# Patient Record
Sex: Female | Born: 1950
Health system: Southern US, Community
[De-identification: ages and names within clinical notes are randomized; demographics above are authoritative.]

## PROBLEM LIST (undated history)

## (undated) DIAGNOSIS — T4145XA Adverse effect of unspecified anesthetic, initial encounter: Secondary | ICD-10-CM

## (undated) DIAGNOSIS — H547 Unspecified visual loss: Secondary | ICD-10-CM

## (undated) DIAGNOSIS — K5900 Constipation, unspecified: Secondary | ICD-10-CM

## (undated) DIAGNOSIS — I341 Nonrheumatic mitral (valve) prolapse: Secondary | ICD-10-CM

## (undated) DIAGNOSIS — N879 Dysplasia of cervix uteri, unspecified: Secondary | ICD-10-CM

## (undated) DIAGNOSIS — R112 Nausea with vomiting, unspecified: Secondary | ICD-10-CM

## (undated) DIAGNOSIS — D649 Anemia, unspecified: Secondary | ICD-10-CM

## (undated) DIAGNOSIS — R5383 Other fatigue: Secondary | ICD-10-CM

## (undated) DIAGNOSIS — Z9889 Other specified postprocedural states: Secondary | ICD-10-CM

## (undated) DIAGNOSIS — F329 Major depressive disorder, single episode, unspecified: Secondary | ICD-10-CM

## (undated) DIAGNOSIS — K629 Disease of anus and rectum, unspecified: Secondary | ICD-10-CM

## (undated) DIAGNOSIS — D049 Carcinoma in situ of skin, unspecified: Secondary | ICD-10-CM

## (undated) DIAGNOSIS — R7303 Prediabetes: Secondary | ICD-10-CM

## (undated) DIAGNOSIS — H3410 Central retinal artery occlusion, unspecified eye: Secondary | ICD-10-CM

## (undated) DIAGNOSIS — Z8619 Personal history of other infectious and parasitic diseases: Secondary | ICD-10-CM

## (undated) DIAGNOSIS — T8859XA Other complications of anesthesia, initial encounter: Secondary | ICD-10-CM

## (undated) DIAGNOSIS — M1990 Unspecified osteoarthritis, unspecified site: Secondary | ICD-10-CM

## (undated) DIAGNOSIS — A64 Unspecified sexually transmitted disease: Secondary | ICD-10-CM

## (undated) DIAGNOSIS — Z0000 Encounter for general adult medical examination without abnormal findings: Secondary | ICD-10-CM

## (undated) DIAGNOSIS — T7840XA Allergy, unspecified, initial encounter: Secondary | ICD-10-CM

## (undated) DIAGNOSIS — M810 Age-related osteoporosis without current pathological fracture: Secondary | ICD-10-CM

## (undated) DIAGNOSIS — E119 Type 2 diabetes mellitus without complications: Secondary | ICD-10-CM

## (undated) DIAGNOSIS — C801 Malignant (primary) neoplasm, unspecified: Secondary | ICD-10-CM

## (undated) DIAGNOSIS — E785 Hyperlipidemia, unspecified: Secondary | ICD-10-CM

## (undated) DIAGNOSIS — H269 Unspecified cataract: Secondary | ICD-10-CM

## (undated) DIAGNOSIS — F32A Depression, unspecified: Secondary | ICD-10-CM

## (undated) HISTORY — DX: Dysplasia of cervix uteri, unspecified: N87.9

## (undated) HISTORY — DX: Other complications of anesthesia, initial encounter: T88.59XA

## (undated) HISTORY — DX: Type 2 diabetes mellitus without complications: E11.9

## (undated) HISTORY — PX: EYE SURGERY: SHX253

## (undated) HISTORY — DX: Major depressive disorder, single episode, unspecified: F32.9

## (undated) HISTORY — DX: Other fatigue: R53.83

## (undated) HISTORY — DX: Unspecified visual loss: H54.7

## (undated) HISTORY — DX: Encounter for general adult medical examination without abnormal findings: Z00.00

## (undated) HISTORY — DX: Central retinal artery occlusion, unspecified eye: H34.10

## (undated) HISTORY — DX: Hyperlipidemia, unspecified: E78.5

## (undated) HISTORY — PX: POLYPECTOMY: SHX149

## (undated) HISTORY — DX: Disease of anus and rectum, unspecified: K62.9

## (undated) HISTORY — PX: CATARACT EXTRACTION, BILATERAL: SHX1313

## (undated) HISTORY — DX: Other specified postprocedural states: Z98.890

## (undated) HISTORY — DX: Malignant (primary) neoplasm, unspecified: C80.1

## (undated) HISTORY — DX: Personal history of other infectious and parasitic diseases: Z86.19

## (undated) HISTORY — DX: Prediabetes: R73.03

## (undated) HISTORY — PX: OTHER SURGICAL HISTORY: SHX169

## (undated) HISTORY — DX: Nonrheumatic mitral (valve) prolapse: I34.1

## (undated) HISTORY — PX: LAMINECTOMY: SHX219

## (undated) HISTORY — DX: Carcinoma in situ of skin, unspecified: D04.9

## (undated) HISTORY — DX: Constipation, unspecified: K59.00

## (undated) HISTORY — DX: Other specified postprocedural states: R11.2

## (undated) HISTORY — PX: EXCISION MORTON'S NEUROMA: SHX5013

## (undated) HISTORY — DX: Adverse effect of unspecified anesthetic, initial encounter: T41.45XA

## (undated) HISTORY — DX: Depression, unspecified: F32.A

## (undated) HISTORY — PX: TUBAL LIGATION: SHX77

## (undated) HISTORY — DX: Unspecified sexually transmitted disease: A64

## (undated) HISTORY — DX: Unspecified cataract: H26.9

## (undated) HISTORY — DX: Age-related osteoporosis without current pathological fracture: M81.0

## (undated) HISTORY — DX: Unspecified osteoarthritis, unspecified site: M19.90

## (undated) HISTORY — PX: CERVICAL CONE BIOPSY: SUR198

## (undated) HISTORY — DX: Anemia, unspecified: D64.9

## (undated) HISTORY — DX: Allergy, unspecified, initial encounter: T78.40XA

---

## 1991-04-08 DIAGNOSIS — N879 Dysplasia of cervix uteri, unspecified: Secondary | ICD-10-CM

## 1991-04-08 HISTORY — DX: Dysplasia of cervix uteri, unspecified: N87.9

## 1998-02-01 ENCOUNTER — Other Ambulatory Visit: Admission: RE | Admit: 1998-02-01 | Discharge: 1998-02-01 | Payer: Self-pay | Admitting: *Deleted

## 1998-05-21 ENCOUNTER — Other Ambulatory Visit: Admission: RE | Admit: 1998-05-21 | Discharge: 1998-05-21 | Payer: Self-pay | Admitting: *Deleted

## 1999-08-11 ENCOUNTER — Other Ambulatory Visit: Admission: RE | Admit: 1999-08-11 | Discharge: 1999-08-11 | Payer: Self-pay | Admitting: *Deleted

## 2000-08-11 ENCOUNTER — Other Ambulatory Visit: Admission: RE | Admit: 2000-08-11 | Discharge: 2000-08-11 | Payer: Self-pay | Admitting: *Deleted

## 2001-08-01 ENCOUNTER — Other Ambulatory Visit: Admission: RE | Admit: 2001-08-01 | Discharge: 2001-08-01 | Payer: Self-pay | Admitting: *Deleted

## 2001-09-07 HISTORY — PX: COLONOSCOPY: SHX174

## 2002-06-19 ENCOUNTER — Ambulatory Visit (HOSPITAL_COMMUNITY): Admission: RE | Admit: 2002-06-19 | Discharge: 2002-06-19 | Payer: Self-pay | Admitting: Gastroenterology

## 2002-06-19 ENCOUNTER — Encounter (INDEPENDENT_AMBULATORY_CARE_PROVIDER_SITE_OTHER): Payer: Self-pay | Admitting: Specialist

## 2002-08-07 ENCOUNTER — Other Ambulatory Visit: Admission: RE | Admit: 2002-08-07 | Discharge: 2002-08-07 | Payer: Self-pay | Admitting: *Deleted

## 2003-08-13 ENCOUNTER — Other Ambulatory Visit: Admission: RE | Admit: 2003-08-13 | Discharge: 2003-08-13 | Payer: Self-pay | Admitting: *Deleted

## 2004-08-13 ENCOUNTER — Other Ambulatory Visit: Admission: RE | Admit: 2004-08-13 | Discharge: 2004-08-13 | Payer: Self-pay | Admitting: *Deleted

## 2004-09-16 ENCOUNTER — Ambulatory Visit: Payer: Self-pay | Admitting: Internal Medicine

## 2004-10-09 ENCOUNTER — Ambulatory Visit: Payer: Self-pay | Admitting: Internal Medicine

## 2004-12-11 ENCOUNTER — Ambulatory Visit: Payer: Self-pay | Admitting: Internal Medicine

## 2004-12-12 ENCOUNTER — Encounter: Admission: RE | Admit: 2004-12-12 | Discharge: 2004-12-12 | Payer: Self-pay | Admitting: Internal Medicine

## 2005-01-15 ENCOUNTER — Ambulatory Visit: Payer: Self-pay | Admitting: Internal Medicine

## 2005-04-16 ENCOUNTER — Ambulatory Visit: Payer: Self-pay | Admitting: Internal Medicine

## 2005-05-12 ENCOUNTER — Ambulatory Visit: Payer: Self-pay | Admitting: Internal Medicine

## 2005-06-23 ENCOUNTER — Ambulatory Visit: Payer: Self-pay | Admitting: Internal Medicine

## 2005-07-21 ENCOUNTER — Ambulatory Visit: Payer: Self-pay | Admitting: Internal Medicine

## 2005-09-07 DIAGNOSIS — K629 Disease of anus and rectum, unspecified: Secondary | ICD-10-CM

## 2005-09-07 HISTORY — DX: Disease of anus and rectum, unspecified: K62.9

## 2005-09-07 HISTORY — PX: COLONOSCOPY W/ POLYPECTOMY: SHX1380

## 2005-10-06 ENCOUNTER — Ambulatory Visit: Payer: Self-pay | Admitting: Internal Medicine

## 2006-02-22 ENCOUNTER — Other Ambulatory Visit: Admission: RE | Admit: 2006-02-22 | Discharge: 2006-02-22 | Payer: Self-pay | Admitting: Obstetrics & Gynecology

## 2006-03-24 ENCOUNTER — Ambulatory Visit: Payer: Self-pay | Admitting: Internal Medicine

## 2006-04-08 ENCOUNTER — Encounter: Payer: Self-pay | Admitting: Internal Medicine

## 2006-05-28 HISTORY — PX: RECTAL SURGERY: SHX760

## 2006-06-14 ENCOUNTER — Ambulatory Visit: Payer: Self-pay | Admitting: Internal Medicine

## 2006-08-26 ENCOUNTER — Ambulatory Visit: Payer: Self-pay | Admitting: Internal Medicine

## 2006-12-09 ENCOUNTER — Encounter: Payer: Self-pay | Admitting: Internal Medicine

## 2007-01-19 DIAGNOSIS — F418 Other specified anxiety disorders: Secondary | ICD-10-CM | POA: Insufficient documentation

## 2007-01-19 DIAGNOSIS — F339 Major depressive disorder, recurrent, unspecified: Secondary | ICD-10-CM | POA: Insufficient documentation

## 2007-01-28 ENCOUNTER — Encounter: Payer: Self-pay | Admitting: Internal Medicine

## 2007-01-28 ENCOUNTER — Ambulatory Visit: Payer: Self-pay | Admitting: Internal Medicine

## 2007-01-28 LAB — CONVERTED CEMR LAB
Cholesterol: 230 mg/dL (ref 0–200)
HDL: 59.8 mg/dL (ref >39.0)
Potassium: 4 meq/L (ref 3.5–5.1)
Total CK: 172 units/L (ref 7–177)
Triglycerides: 124 mg/dL (ref 0–149)

## 2007-03-01 ENCOUNTER — Other Ambulatory Visit: Admission: RE | Admit: 2007-03-01 | Discharge: 2007-03-01 | Payer: Self-pay | Admitting: Obstetrics and Gynecology

## 2007-03-29 ENCOUNTER — Ambulatory Visit: Payer: Self-pay | Admitting: Internal Medicine

## 2007-04-14 ENCOUNTER — Encounter: Payer: Self-pay | Admitting: Internal Medicine

## 2007-04-25 ENCOUNTER — Telehealth (INDEPENDENT_AMBULATORY_CARE_PROVIDER_SITE_OTHER): Payer: Self-pay | Admitting: *Deleted

## 2007-05-11 ENCOUNTER — Encounter: Payer: Self-pay | Admitting: Internal Medicine

## 2007-05-23 ENCOUNTER — Ambulatory Visit: Payer: Self-pay | Admitting: Internal Medicine

## 2007-05-24 ENCOUNTER — Encounter (INDEPENDENT_AMBULATORY_CARE_PROVIDER_SITE_OTHER): Payer: Self-pay | Admitting: *Deleted

## 2007-05-24 LAB — CONVERTED CEMR LAB: Hgb A1c MFr Bld: 6.3 % — ABNORMAL HIGH (ref 4.6–6.0)

## 2007-07-14 ENCOUNTER — Ambulatory Visit: Payer: Self-pay | Admitting: Internal Medicine

## 2007-07-14 DIAGNOSIS — E119 Type 2 diabetes mellitus without complications: Secondary | ICD-10-CM | POA: Insufficient documentation

## 2007-07-20 ENCOUNTER — Encounter (INDEPENDENT_AMBULATORY_CARE_PROVIDER_SITE_OTHER): Payer: Self-pay | Admitting: *Deleted

## 2007-07-20 LAB — CONVERTED CEMR LAB
ALT: 19 units/L (ref 0–35)
Albumin: 3.7 g/dL (ref 3.5–5.2)
BUN: 16 mg/dL (ref 6–23)
Basophils Absolute: 0 10*3/uL (ref 0.0–0.1)
Bilirubin, Direct: 0.1 mg/dL (ref 0.0–0.3)
Creatinine, Ser: 0.6 mg/dL (ref 0.4–1.2)
Eosinophils Absolute: 0.4 10*3/uL (ref 0.0–0.6)
Free T4: 0.7 ng/dL (ref 0.6–1.6)
HCT: 35 % — ABNORMAL LOW (ref 36.0–46.0)
Lymphocytes Relative: 41.9 % (ref 12.0–46.0)
MCV: 90.1 fL (ref 78.0–100.0)
Monocytes Relative: 7.8 % (ref 3.0–11.0)
Neutro Abs: 3.1 10*3/uL (ref 1.4–7.7)
Platelets: 263 10*3/uL (ref 150–400)
Total Bilirubin: 1.1 mg/dL (ref 0.3–1.2)
WBC: 6.8 10*3/uL (ref 4.5–10.5)

## 2007-08-01 ENCOUNTER — Encounter (INDEPENDENT_AMBULATORY_CARE_PROVIDER_SITE_OTHER): Payer: Self-pay | Admitting: *Deleted

## 2007-08-01 ENCOUNTER — Ambulatory Visit: Payer: Self-pay | Admitting: Internal Medicine

## 2007-08-16 ENCOUNTER — Ambulatory Visit: Payer: Self-pay | Admitting: Internal Medicine

## 2007-08-20 ENCOUNTER — Encounter (INDEPENDENT_AMBULATORY_CARE_PROVIDER_SITE_OTHER): Payer: Self-pay | Admitting: *Deleted

## 2007-08-20 LAB — CONVERTED CEMR LAB
HCT: 36.5 % (ref 36.0–46.0)
Hemoglobin: 12.3 g/dL (ref 12.0–15.0)
Iron: 96 ug/dL (ref 42–145)
Lymphocytes Relative: 51.9 % — ABNORMAL HIGH (ref 12.0–46.0)
MCHC: 33.8 g/dL (ref 30.0–36.0)
Monocytes Relative: 9 % (ref 3.0–11.0)
Neutro Abs: 2.3 10*3/uL (ref 1.4–7.7)
Platelets: 279 10*3/uL (ref 150–400)
RBC: 4 M/uL (ref 3.87–5.11)
RDW: 12.8 % (ref 11.5–14.6)
Transferrin: 305.5 mg/dL (ref >=212.0)
Vitamin B-12: 480 pg/mL (ref 211–911)

## 2007-09-12 ENCOUNTER — Telehealth (INDEPENDENT_AMBULATORY_CARE_PROVIDER_SITE_OTHER): Payer: Self-pay | Admitting: *Deleted

## 2008-02-17 ENCOUNTER — Ambulatory Visit: Payer: Self-pay | Admitting: Internal Medicine

## 2008-03-02 ENCOUNTER — Other Ambulatory Visit: Admission: RE | Admit: 2008-03-02 | Discharge: 2008-03-02 | Payer: Self-pay | Admitting: Obstetrics and Gynecology

## 2008-06-26 ENCOUNTER — Encounter: Payer: Self-pay | Admitting: Internal Medicine

## 2008-06-27 ENCOUNTER — Ambulatory Visit: Payer: Self-pay | Admitting: Internal Medicine

## 2008-06-27 DIAGNOSIS — C20 Malignant neoplasm of rectum: Secondary | ICD-10-CM | POA: Insufficient documentation

## 2008-06-27 DIAGNOSIS — Z86010 Personal history of colonic polyps: Secondary | ICD-10-CM | POA: Insufficient documentation

## 2008-06-27 DIAGNOSIS — Z85828 Personal history of other malignant neoplasm of skin: Secondary | ICD-10-CM | POA: Insufficient documentation

## 2008-06-27 DIAGNOSIS — E785 Hyperlipidemia, unspecified: Secondary | ICD-10-CM | POA: Insufficient documentation

## 2008-06-29 ENCOUNTER — Encounter (INDEPENDENT_AMBULATORY_CARE_PROVIDER_SITE_OTHER): Payer: Self-pay | Admitting: *Deleted

## 2008-06-29 LAB — CONVERTED CEMR LAB
ALT: 20 units/L (ref 0–35)
AST: 25 units/L (ref 0–37)
BUN: 15 mg/dL (ref 6–23)
Bilirubin, Direct: 0.2 mg/dL (ref 0.0–0.3)
Creatinine, Ser: 0.9 mg/dL (ref 0.4–1.2)
Creatinine,U: 83.1 mg/dL
HCT: 37.5 % (ref 36.0–46.0)
HDL: 66.1 mg/dL (ref >39.0)
Hemoglobin: 12.5 g/dL (ref 12.0–15.0)
Hgb A1c MFr Bld: 6.2 % — ABNORMAL HIGH (ref 4.6–6.0)
MCHC: 33.3 g/dL (ref 30.0–36.0)
Microalb, Ur: 0.3 mg/dL (ref 0.0–1.9)
Platelets: 220 10*3/uL (ref 150–400)
Potassium: 5.1 meq/L (ref 3.5–5.1)
RDW: 12.9 % (ref 11.5–14.6)

## 2008-10-22 ENCOUNTER — Encounter: Payer: Self-pay | Admitting: Internal Medicine

## 2008-11-28 ENCOUNTER — Encounter: Payer: Self-pay | Admitting: Internal Medicine

## 2009-01-11 ENCOUNTER — Ambulatory Visit: Payer: Self-pay | Admitting: Internal Medicine

## 2009-01-14 ENCOUNTER — Encounter (INDEPENDENT_AMBULATORY_CARE_PROVIDER_SITE_OTHER): Payer: Self-pay | Admitting: *Deleted

## 2009-01-15 ENCOUNTER — Encounter: Payer: Self-pay | Admitting: Internal Medicine

## 2009-05-06 ENCOUNTER — Telehealth (INDEPENDENT_AMBULATORY_CARE_PROVIDER_SITE_OTHER): Payer: Self-pay | Admitting: *Deleted

## 2009-05-06 ENCOUNTER — Ambulatory Visit: Payer: Self-pay | Admitting: Internal Medicine

## 2009-05-06 LAB — CONVERTED CEMR LAB
Bilirubin Urine: NEGATIVE
Ketones, urine, test strip: NEGATIVE
Specific Gravity, Urine: <1.005
Urobilinogen, UA: 1
pH: 8

## 2009-05-09 ENCOUNTER — Telehealth (INDEPENDENT_AMBULATORY_CARE_PROVIDER_SITE_OTHER): Payer: Self-pay | Admitting: *Deleted

## 2009-06-04 ENCOUNTER — Encounter: Payer: Self-pay | Admitting: Internal Medicine

## 2009-07-12 ENCOUNTER — Ambulatory Visit: Payer: Self-pay | Admitting: Internal Medicine

## 2009-07-15 ENCOUNTER — Encounter (INDEPENDENT_AMBULATORY_CARE_PROVIDER_SITE_OTHER): Payer: Self-pay | Admitting: *Deleted

## 2009-12-26 ENCOUNTER — Ambulatory Visit: Payer: Self-pay | Admitting: Internal Medicine

## 2010-01-08 ENCOUNTER — Ambulatory Visit: Payer: Self-pay | Admitting: Internal Medicine

## 2010-01-17 LAB — CONVERTED CEMR LAB
ALT: 16 units/L (ref 0–35)
Albumin: 4.1 g/dL (ref 3.5–5.2)
Alkaline Phosphatase: 40 units/L (ref 39–117)
Cholesterol: 213 mg/dL — ABNORMAL HIGH (ref 0–200)
Direct LDL: 119.1 mg/dL
Microalb Creat Ratio: 7.3 mg/g (ref 0.0–30.0)
Microalb, Ur: 0.9 mg/dL (ref 0.0–1.9)
TSH: 2.49 microintl units/mL (ref 0.35–5.50)
Total Bilirubin: 1.2 mg/dL (ref 0.3–1.2)
Total Protein: 6.2 g/dL (ref 6.0–8.3)
Triglycerides: 58 mg/dL (ref 0.0–149.0)

## 2010-01-31 ENCOUNTER — Ambulatory Visit: Payer: Self-pay | Admitting: Internal Medicine

## 2010-02-04 LAB — CONVERTED CEMR LAB
Basophils Absolute: 0 10*3/uL (ref 0.0–0.1)
Eosinophils Relative: 2 % (ref 0–5)
HCT: 38.6 % (ref 36.0–46.0)
MCV: 90.4 fL (ref 78.0–100.0)
Monocytes Relative: 6 % (ref 3–12)
Neutro Abs: 3.2 10*3/uL (ref 1.7–7.7)
Potassium: 4.1 meq/L (ref 3.5–5.3)
RBC: 4.27 M/uL (ref 3.87–5.11)
WBC: 7.4 10*3/uL (ref 4.0–10.5)

## 2010-02-05 ENCOUNTER — Encounter: Payer: Self-pay | Admitting: Internal Medicine

## 2010-02-13 ENCOUNTER — Encounter: Payer: Self-pay | Admitting: Internal Medicine

## 2010-02-14 ENCOUNTER — Telehealth (INDEPENDENT_AMBULATORY_CARE_PROVIDER_SITE_OTHER): Payer: Self-pay | Admitting: *Deleted

## 2010-03-19 ENCOUNTER — Ambulatory Visit: Payer: Self-pay | Admitting: Internal Medicine

## 2010-03-20 ENCOUNTER — Telehealth (INDEPENDENT_AMBULATORY_CARE_PROVIDER_SITE_OTHER): Payer: Self-pay | Admitting: *Deleted

## 2010-04-22 ENCOUNTER — Ambulatory Visit: Payer: Self-pay | Admitting: Internal Medicine

## 2010-04-24 ENCOUNTER — Encounter: Payer: Self-pay | Admitting: Internal Medicine

## 2010-08-25 ENCOUNTER — Ambulatory Visit: Payer: Self-pay | Admitting: Internal Medicine

## 2010-08-26 LAB — CONVERTED CEMR LAB
Creatinine, Ser: 1.1 mg/dL (ref 0.4–1.2)
Hgb A1c MFr Bld: 6.1 % (ref 4.6–6.5)
Microalb, Ur: 0.6 mg/dL (ref 0.0–1.9)
Potassium: 4.1 meq/L (ref 3.5–5.1)

## 2010-10-05 LAB — CONVERTED CEMR LAB
ALT: 21 units/L (ref 0–35)
AST: 24 units/L (ref 0–37)
Alkaline Phosphatase: 41 units/L (ref 39–117)
BUN: 17 mg/dL (ref 6–23)
Creatinine, Ser: 1.1 mg/dL (ref 0.4–1.2)
Hgb A1c MFr Bld: 6.4 % (ref 4.6–6.5)
TSH: 0.83 microintl units/mL (ref 0.35–5.50)

## 2010-10-09 NOTE — Letter (Signed)
Summary: [facility]  [facility]   Imported By: Lanelle Bal 02/22/2010 11:21:36  _____________________________________________________________________  External Attachment:    Type:   Image     Comment:   External Document

## 2010-10-09 NOTE — Assessment & Plan Note (Signed)
Summary: STEPPED OFF CURB AND TWISTED ANKLE/KB   Vital Signs:  Patient profile:   60 year old female Temp:     98.3 degrees F oral Pulse rate:   76 / minute Resp:     15 per minute BP sitting:   110 / 60  (left arm) Cuff size:   large  Vitals Entered By: Shonna Chock CMA (March 19, 2010 1:36 PM) CC: Ankle Injury, Lower Extremity Joint pain Comments REVIEWED MED LIST, PATIENT AGREED DOSE AND INSTRUCTION CORRECT    CC:  Ankle Injury and Lower Extremity Joint pain.  History of Present Illness: Lower Extremity  Pain      This is a 60 year old woman who presents with Lower Extremity  pain.  The patient reports swelling and decreased ROM, but denies redness.  The pain is located in the right  lateral foot.  The pain began suddenly, with twisting, and with a fall when she misstepped on concrete block bordering flower garden this am @ 8:30 am.  She was wearing athletic shoes. The pain is described as sharp with movement  and dull @ rest.  To date  no evaluation. Rx: RICE . PMH of mild Osteopenia.  Allergies: 1)  Steroids 2)  * Decongestants  Physical Exam  General:  in no acute distress; alert,appropriate and cooperative throughout examination Pulses:  R and L dorsalis pedis and posterior tibial pulses are full and equal bilaterally Extremities:  No clubbing, cyanosis, edema, or deformity noted. Pain with medial rotation & inversion of R ankle @ lateral & medial arch foot > lateral rotation & eversion. Tender over proximal lateral foot Skin:  Very faint bruising R lateral malleolar area   Impression & Recommendations:  Problem # 1:  ANKLE INJURY, RIGHT (ICD-959.7)  R/O fracture  Orders: T-Ankle Comp Right (73610TC) T-Foot Right (81191YN) Prescription Created Electronically [phone]) Durable Medical Equipment (DME)  Complete Medication List: 1)  Metformin Hcl 500 Mg Tabs (Metformin hcl) .Marland Kitchen.. 1 by mouth two times a day 2)  Actos 30 Mg Tabs (Pioglitazone hcl) .Marland Kitchen.. 1 by mouth qd 3)   Ascensia Contour Monitor Kit (Blood glucose monitoring suppl) .... Use one time a day 4)  Vagifem 25 Mcg Tabs (Estradiol) .... 2 x weekly 5)  Lamictal 200 Mg Tabs (Lamotrigine) .Marland Kitchen.. 1 by mouth once daily 6)  Vaniqa 13.9 % Crea (Eflornithine hcl) .... Apply two times a day , @ least 8 hrs apart 7)  Sonata 5 Mg Caps (Zaleplon) .Marland Kitchen.. 1 by mouth at bedtime (? dose) 8)  Tramadol Hcl 50 Mg Tabs (Tramadol hcl) .Marland Kitchen.. 1 every 6 hrs as needed pain  Patient Instructions: 1)  Continue RICE this evening Prescriptions: TRAMADOL HCL 50 MG TABS (TRAMADOL HCL) 1 every 6 hrs as needed pain  #30 x 1   Entered and Authorized by:   Marga Melnick MD   Signed by:   Marga Melnick MD on 03/19/2010   Method used:   [facility]ed to ...       [facility] [address] [facility]retail)       [address]       [place], [place]  [address]       Ph: [phone] or [phone]       Fax: [phone]   RxID:   [id number]

## 2010-10-09 NOTE — Progress Notes (Signed)
Summary: Request for lab results  Phone Note Call from Patient Call back at Work Phone [phone]   Caller: Patient Summary of Call: Message left on VM pleae call with C-Diff Results   I called and spoke with patient: Good, no C. difficle present.This is the bacteria which appears after antibiotic therapy & causes diarrhea related to release of toxin. Hopp  Patient ok'd information and aware copy mailed./Chrae [facility]  February 14, 2010 12:17 PM

## 2010-10-09 NOTE — Assessment & Plan Note (Signed)
Summary: shingles shot/kn  Nurse Visit   Allergies: 1)  Steroids 2)  * Decongestants  Immunizations Administered:  Zostavax # 1:    Vaccine Type: Zostavax    Site: right arm    Mfr: Merck    Dose: 0.12ml    Route: [place]    Given by: Brenton Grills MA    Exp. Date: 04/24/2011    Lot #: [id number]    VIS given: 06/19/05 given April 22, 2010.  Orders Added: 1)  Zoster (Shingles) Vaccine Live [90736] 2)  Admin 1st Vaccine [phone]

## 2010-10-09 NOTE — Progress Notes (Signed)
Summary: Request for Radiology Results  Phone Note Call from Patient Call back at Work Phone [phone]   Caller: Patient Summary of Call: Message left on VM: Patient would like radiology results   Shonna Chock CMA  March 20, 2010 4:21 PM   Follow-up for Phone Call        Spoke with patient about foot/ankle xray's:  Per Dr.Hopper: Normal changes of "wisdom and maturity" Per Dr.Hopper wear the shoe until pain free for at least 72 hours  Patient ok'd all information and informed copy of reports mailed    Follow-up by: Shonna Chock CMA,  March 20, 2010 4:28 PM

## 2010-10-09 NOTE — Assessment & Plan Note (Signed)
Summary: dirrehea/cbs   Vital Signs:  Patient profile:   60 year old female Weight:      155.8 pounds Temp:     98.5 degrees F oral Pulse rate:   80 / minute Resp:     16 per minute BP sitting:   106 / 70  (left arm) Cuff size:   large  Vitals Entered By: Shonna Chock (Jan 31, 2010 3:07 PM) CC: Diarrhea x 3 weeks  Comments REVIEWED MED LIST, PATIENT AGREED DOSE AND INSTRUCTION CORRECT    CC:  Diarrhea x 3 weeks .  History of Present Illness:  Diarrhea      This is a 60 year old woman who presents with Diarrhea X 3 weeks.  The patient reports 4 stools or less per day, watery/unformed stools, fecal urgency, and fasting diarrhea, but denies voluminous stools, blood in stool, mucus in stool, greasy stools, malodorous stools, fecal soiling, alternating diarrhea/constipation, nocturnal diarrhea, bloating, gassiness.Onset was semi  gradual , increasing over several days. Associated symptoms include nausea and weight loss of 3#.  The patient denies fever, abdominal pain, abdominal cramps, vomiting, lightheadedness, increased thirst, joint pains, mouth ulcers, and eye redness.  The symptoms are better with hypomotility agents, Immodium.  Patient's risk factors for diarrhea include recent antibiotic use. Amoxicillin for  sinusitis in 12/2009 caused vaginitis. . She did have  international travel but only after symptoms appeared. Symptoms were better in [place].  Patient has a  history of lactose intolerance. DM well controlled ; FBS 90-110. No hypoglycemia.  Allergies: 1)  Steroids 2)  * Decongestants  Review of Systems General:  Complains of chills, fatigue, and sleep disorder; denies sweats; Being treated @ [facility] by Dr Quintella Reichert. ENT:  Denies difficulty swallowing and hoarseness. CV:  Denies palpitations. GI:  Denies yellowish skin color. GU:  No dark urine. Derm:  Denies lesion(s) and rash. Neuro:  Denies numbness and tingling. Psych:  Complains of anxiety, depression,  easily angered, easily tearful, and irritability; "Roller coaster " emotions; now "@ bottom of barrell". Husband's mental state a stress.  Physical Exam  General:  well-nourished,in no acute distress; alert,appropriate and cooperative throughout examination Eyes:  No corneal or conjunctival inflammation noted. Perrla. No icterus Mouth:  Oral mucosa and oropharynx without lesions or exudates.  Tongue moist Neck:  No deformities, masses, or tenderness noted. Lungs:  Normal respiratory effort, chest expands symmetrically. Lungs are clear to auscultation, no crackles or wheezes. Heart:  Normal rate and regular rhythm. S1 and S2 normal without gallop, murmur, click, rub or other extra sounds. Abdomen:  Bowel sounds positive,abdomen soft and non-tender without masses, organomegaly or hernias noted. Neurologic:  alert & oriented X3 and DTRs symmetrical and normal.   Skin:  Intact without suspicious lesions or rashes. No significant tenting. No jaundice Cervical Nodes:  No lymphadenopathy noted Axillary Nodes:  No palpable lymphadenopathy Psych:  memory intact for recent and remote, flat affect, and subdued.     Impression & Recommendations:  Problem # 1:  DIARRHEA (ICD-787.91)  R/O C difficle from antibiotics  Orders: Venipuncture (52841) TLB-CBC Platelet - w/Differential (85025-CBCD) TLB-Creatinine, Blood (82565-CREA) TLB-Potassium (K+) (84132-K) TLB-BUN (Urea Nitrogen) (84520-BUN) T-Culture, C-Diff Toxin A/B (32440-10272)  Problem # 2:  DIABETES-TYPE 2 (ICD-250.00) controlled Her updated medication list for this problem includes:    Metformin Hcl 500 Mg Tabs (Metformin hcl) .Marland Kitchen... 1 by mouth bid    Actos 30 Mg Tabs (Pioglitazone hcl) .Marland Kitchen... 1 by mouth qd  Complete Medication List: 1)  Metformin Hcl 500 Mg Tabs (Metformin hcl) .Marland Kitchen.. 1 by mouth bid 2)  Actos 30 Mg Tabs (Pioglitazone hcl) .Marland Kitchen.. 1 by mouth qd 3)  Ascensia Contour Monitor Kit (Blood glucose monitoring suppl) .... Use one  time a day 4)  Vagifem 25 Mcg Tabs (Estradiol) .Marland Kitchen.. 1 by mouth 2 x weekly 5)  Lamictal 100 Mg  .Marland KitchenMarland Kitchen. 1 by mouth once daily 6)  Vaniqa 13.9 % Crea (Eflornithine hcl) .... Apply two times a day , @ least 8 hrs apart 7)  Sonata 5 Mg Caps (Zaleplon) .Marland Kitchen.. 1 by mouth at bedtime (? dose) 8)  Metronidazole 250 Mg Tabs (Metronidazole) .Marland Kitchen.. 1 three times a day  Patient Instructions: 1)  Align once daily until bowels are normal. No alcohol with Metronidazole. Prescriptions: METRONIDAZOLE 250 MG TABS (METRONIDAZOLE) 1 three times a day  #21 x 0   Entered and Authorized by:   Marga Melnick MD   Signed by:   Marga Melnick MD on 01/31/2010   Method used:   [facility] ...       [facility] [address] (retail)       [address]       [place], [place]  [address]       Ph: [phone] or [phone]       Fax: [phone]   RxID:   [id number]

## 2010-10-09 NOTE — Assessment & Plan Note (Signed)
Summary: MED REFILL//PH   Vital Signs:  Patient profile:   60 year old female Weight:      139 pounds BMI:     23.58 Pulse rate:   60 / minute Resp:     15 per minute BP sitting:   110 / 62  (left arm) Cuff size:   regular  Vitals Entered By: Shonna Chock CMA (August 25, 2010 1:10 PM) CC: 6 month follow-up, Type 2 diabetes mellitus follow-up   CC:  6 month follow-up and Type 2 diabetes mellitus follow-up.  History of Present Illness: Type 2 Diabetes Mellitus Follow-Up      This is a 60 year old woman who presents for Type 2 diabetes mellitus follow-up.  The patient reports  purposeful (TLC) weight loss of 60# since 01/10, but denies polyuria, polydipsia, blurred vision, self managed hypoglycemia, and numbness of extremities.  The patient denies the following symptoms: chest pain, vomiting, orthostatic symptoms, poor wound healing, vision loss, and foot ulcer.  Since the last visit the patient reports good dietary compliance and exercising regularly.  The patient has been measuring capillary blood glucose before breakfast, 90-110.  Since the last visit, the patient reports having had eye care by an Ophthalmologist  last month. No retinopathy present. Actos wil  cost  $450 if she changes her plan.  Current Medications (verified): 1)  Metformin Hcl 500 Mg Tabs (Metformin Hcl) .Marland Kitchen.. 1 By Mouth Two Times A Day 2)  Actos 30 Mg Tabs (Pioglitazone Hcl) .Marland Kitchen.. 1 By Mouth Qd 3)  Ascensia Contour Monitor   Kit (Blood Glucose Monitoring Suppl) .... Use One Time A Day 4)  Vagifem 25 Mcg Tabs (Estradiol) .... 2 X Weekly 5)  Lamictal 200 Mg Tabs (Lamotrigine) .Marland Kitchen.. 1 By Mouth Once Daily 6)  Vaniqa 13.9 % Crea (Eflornithine Hcl) .... Apply Two Times A Day , @ Least 8 Hrs Apart 7)  Claritin 10 Mg Tabs (Loratadine) .Marland Kitchen.. 1 By Mouth Once Daily  Allergies: 1)  Steroids 2)  * Decongestants  Physical Exam  General:  Thin but well-nourished;alert,appropriate and cooperative throughout examination Lungs:   Normal respiratory effort, chest expands symmetrically. Lungs are clear to auscultation, no crackles or wheezes. Heart:  normal rate, regular rhythm, no gallop, no rub, no JVD, and grade  1/2-1 /6 systolic murmur.   Pulses:  R and L carotid,radial,dorsalis pedis and posterior tibial pulses are full and equal bilaterally Extremities:  No clubbing, cyanosis, edema.Good nail health. Neurologic:  alert & oriented X3 and sensation intact to light touch over feet.   Skin:  Intact without suspicious lesions or rashes   Impression & Recommendations:  Problem # 1:  DIABETES-TYPE 2 (ICD-250.00)  Her updated medication list for this problem includes:    Metformin Hcl 500 Mg Tabs (Metformin hcl) .Marland Kitchen... 1 by mouth two times a day    Actos 30 Mg Tabs (Pioglitazone hcl) .Marland Kitchen... 1 by mouth qd  Orders: Venipuncture (56213) TLB-Creatinine, Blood (82565-CREA) TLB-Potassium (K+) (84132-K) TLB-BUN (Urea Nitrogen) (84520-BUN) TLB-A1C / Hgb A1C (Glycohemoglobin) (83036-A1C) TLB-Microalbumin/Creat Ratio, Urine (82043-MALB)  Complete Medication List: 1)  Metformin Hcl 500 Mg Tabs (Metformin hcl) .Marland Kitchen.. 1 by mouth two times a day 2)  Actos 30 Mg Tabs (Pioglitazone hcl) .Marland Kitchen.. 1 by mouth qd 3)  Ascensia Contour Monitor Kit (Blood glucose monitoring suppl) .... Use one time a day 4)  Vagifem 25 Mcg Tabs (Estradiol) .... 2 x weekly 5)  Lamictal 200 Mg Tabs (Lamotrigine) .Marland Kitchen.. 1 by mouth once daily 6)  Vaniqa 13.9 % Crea (Eflornithine hcl) .... Apply two times a day , @ least 8 hrs apart 7)  Claritin 10 Mg Tabs (Loratadine) .Marland Kitchen.. 1 by mouth once daily  Patient Instructions: 1)  Med change wil be determined by labs.   Orders Added: 1)  Est. Patient Level III [53664] 2)  Venipuncture [40347] 3)  TLB-Creatinine, Blood [82565-CREA] 4)  TLB-Potassium (K+) [84132-K] 5)  TLB-BUN (Urea Nitrogen) [84520-BUN] 6)  TLB-A1C / Hgb A1C (Glycohemoglobin) [83036-A1C] 7)  TLB-Microalbumin/Creat Ratio, Urine  [82043-MALB]  Appended Document: MED REFILL//PH

## 2010-10-09 NOTE — Miscellaneous (Signed)
Summary: Flu/[facility]   [facility]   Imported By: Lanelle Bal 09/12/2009 08:34:14  _____________________________________________________________________  External Attachment:    Type:   Image     Comment:   External Document

## 2010-10-09 NOTE — Assessment & Plan Note (Signed)
Summary: med refill//lch   Vital Signs:  Patient profile:   60 year old female Height:      64.5 inches Weight:      167.8 pounds BMI:     28.46 Temp:     98.0 degrees F oral Pulse rate:   72 / minute Resp:     16 per minute BP sitting:   120 / 64  (left arm) Cuff size:   large  Vitals Entered By: Shonna Chock (December 26, 2009 3:45 PM) CC: Refill meds-patient states "Not a Physical" Comments REVIEWED MED LIST, PATIENT AGREED DOSE AND INSTRUCTION CORRECT    CC:  Refill meds-patient states "Not a Physical".  History of Present Illness: Destiny Romero is here for med refills; she has an active RTI, now mainly  as symptoms in her chest. FBS average 100; no 2 hr post meal glucoses recorded. No hypoglycemia. Weight down 35 # with portion control & increased CVE as gym 30 min 3X/ week on average.  Preventive Screening-Counseling & Management  Caffeine-Diet-Exercise     Does Patient Exercise: yes  Allergies: 1)  Steroids 2)  * Decongestants  Past History:  Past Medical History: Depression, Dr Wellington Hampshire, [facility], [place] Diabetes mellitus, type II Colonic polyps, PMH  of, Dr Kinnie Scales Hyperlipidemia: LDL 122 (1296/116), HDL 76, TG 72. LDL goal = < 125, ideally < 90. Skin cancer, PMH  of, Basal cell X1, Dr Venancio Poisson  Past Surgical History: G 0 P0 , Patty Grubb,NP ; Neuroma R foot;Rectal CA (low grade) , Dr  Elvina Mattes; Lumbar laminectomy 1982  Family History: Father: anger syndrome, prostate cancer ,osteoporosis, COAD,lung cancer Mother: parathyroid tumor,DM,Bulemia;osteoporosis, partial gastrectomy for DUD; Sibs:sister cold nodule/thyroid cancer,DM; bro NHL  Social History: Retired Married Never Smoked Alcohol use-yes: socially Regular exercise-yes Does Patient Exercise:  yes  Review of Systems General:  Denies fatigue and sleep disorder. Eyes:  Denies blurring, double vision, and vision loss-both eyes; Last exam 1 yr ago, no retinopathy. ENT:  Complains of  nasal congestion and ringing in ears; denies sinus pressure; No frontal headache or facial pain but some yellow secretions. CV:  Denies chest pain or discomfort, leg cramps with exertion, lightheadness, near fainting, and shortness of breath with exertion. Resp:  Complains of cough and sputum productive; denies shortness of breath and wheezing. GI:  Denies abdominal pain, bloody stools, dark tarry stools, and indigestion. Derm:  Denies poor wound healing. Neuro:  Denies numbness and tingling. Endo:  Denies cold intolerance, excessive hunger, excessive thirst, excessive urination, and heat intolerance.  Physical Exam  General:  well-nourished; alert,appropriate and cooperative throughout examination Ears:  External ear exam shows no significant lesions or deformities.  Otoscopic examination reveals clear canals, tympanic membranes are intact bilaterally without bulging, retraction, inflammation or discharge. Hearing is grossly normal bilaterally. Nose:  External nasal examination shows no deformity or inflammation. Nasal mucosa are pink and moist without lesions or exudates. Mouth:  Oral mucosa and oropharynx without lesions or exudates.  Teeth in good repair. Neck:  No deformities, masses, or tenderness noted. Lungs:  Normal respiratory effort, chest expands symmetrically. Lungs are clear to auscultation, no crackles or wheezes. Heart:  normal rate, regular rhythm, no gallop, no rub, no JVD, no HJR, and grade 1/2-1 /6 systolic murmur.   Abdomen:  Bowel sounds positive,abdomen soft and non-tender without masses, organomegaly or hernias noted. Pulses:  R and L carotid,radial,dorsalis pedis and posterior tibial pulses are full and equal bilaterally Extremities:  No clubbing, cyanosis, edema,  or deformity noted . Good nail health. Neurologic:  alert & oriented X3, sensation intact to light touch over feet, and DTRs symmetrical and normal.   Skin:  Intact without suspicious lesions or  rashes Cervical Nodes:  No lymphadenopathy noted Axillary Nodes:  No palpable lymphadenopathy Psych:  memory intact for recent and remote, normally interactive, good eye contact, not anxious appearing, and not depressed appearing.     Impression & Recommendations:  Problem # 1:  SINUSITIS- ACUTE-NOS (ICD-461.9)  The following medications were removed from the medication list:    Ciprofloxacin Hcl 500 Mg Tabs (Ciprofloxacin hcl) .Marland Kitchen... 1 two times a day Her updated medication list for this problem includes:    Amoxicillin 500 Mg Caps (Amoxicillin) .Marland Kitchen... 1 three times a day  Problem # 2:  BRONCHITIS-ACUTE (ICD-466.0)  The following medications were removed from the medication list:    Ciprofloxacin Hcl 500 Mg Tabs (Ciprofloxacin hcl) .Marland Kitchen... 1 two times a day Her updated medication list for this problem includes:    Amoxicillin 500 Mg Caps (Amoxicillin) .Marland Kitchen... 1 three times a day  Problem # 3:  DIABETES-TYPE 2 (ICD-250.00)  Her updated medication list for this problem includes:    Metformin Hcl 500 Mg Tabs (Metformin hcl) .Marland Kitchen... 1 by mouth bid    Actos 30 Mg Tabs (Pioglitazone hcl) .Marland Kitchen... 1 by mouth qd  Problem # 4:  HYPERLIPIDEMIA (ICD-272.4)  Problem # 5:  NEOPLASM, MALIGNANT, RECTUM (ICD-154.1) Resolved; monitor as per Dr Byrd Hesselbach  Complete Medication List: 1)  Metformin Hcl 500 Mg Tabs (Metformin hcl) .Marland Kitchen.. 1 by mouth bid 2)  Actos 30 Mg Tabs (Pioglitazone hcl) .Marland Kitchen.. 1 by mouth qd 3)  Zoloft 100 Mg Tabs (Sertraline hcl) .Marland Kitchen.. 1 by mouth once daily 4)  Ascensia Contour Monitor Kit (Blood glucose monitoring suppl) .... Use one time a day 5)  Vagifem 25 Mcg Tabs (Estradiol) .Marland Kitchen.. 1 by mouth 2 x weekly 6)  Lamictal 100 Mg  .Marland KitchenMarland Kitchen. 1 by mouth once daily 7)  Amoxicillin 500 Mg Caps (Amoxicillin) .Marland Kitchen.. 1 three times a day  Patient Instructions: 1)  Check your blood sugars regularly. If your readings are usually above :150 or below 90 you should contact our office. 2)  See your eye doctor  yearly to check for diabetic eye damage. 3)  Check your feet each night for sore areas, calluses or signs of infection. Please schedule fastig labs: 4)  BUN,creat, K+;Hepatic Panel ;Lipid Panel ;TSH ;HbgA1C ;Urine Microalbumin. Codes : 250.00,272.4,995.20 Prescriptions: AMOXICILLIN 500 MG CAPS (AMOXICILLIN) 1 three times a day  #30 x 0   Entered and Authorized by:   Marga Melnick MD   Signed by:   Marga Melnick MD on 12/26/2009   Method used:   [facility] ...       [phone][facility] (retail[facility]       [address]       [place], [place]  [address]       Ph: [phone] or [phone]       Fax: [phone]   RxID:   [id number] ACTOS 30 MG TABS (PIOGLITAZONE HCL) 1 by mouth qd  #90 Tablet x 3   Entered and Authorized by:   Marga Melnick MD   Signed by:   Marga Melnick MD on 12/26/2009   Method used:   Print then Give to Patient   RxID:   [id number] METFORMIN HCL 500 MG TABS (METFORMIN HCL) 1 by mouth bid  #180 x 3   Entered and  Authorized by:   Marga Melnick MD   Signed by:   Marga Melnick MD on 12/26/2009   Method used:   Print then Give to Patient   RxID:   [id number]

## 2010-11-12 ENCOUNTER — Ambulatory Visit (INDEPENDENT_AMBULATORY_CARE_PROVIDER_SITE_OTHER): Payer: PRIVATE HEALTH INSURANCE | Admitting: Internal Medicine

## 2010-11-12 ENCOUNTER — Encounter: Payer: Self-pay | Admitting: Internal Medicine

## 2010-11-12 DIAGNOSIS — J029 Acute pharyngitis, unspecified: Secondary | ICD-10-CM

## 2010-11-12 DIAGNOSIS — J019 Acute sinusitis, unspecified: Secondary | ICD-10-CM

## 2010-11-12 DIAGNOSIS — E119 Type 2 diabetes mellitus without complications: Secondary | ICD-10-CM

## 2010-11-18 NOTE — Assessment & Plan Note (Signed)
Summary: sore throat/cbs   Vital Signs:  Patient profile:   60 year old female Weight:      134.2 pounds Temp:     97.7 degrees F oral Pulse rate:   72 / minute Resp:     15 per minute BP sitting:   106 / 68  (left arm) Cuff size:   large  Vitals Entered By: Shonna Chock CMA (November 12, 2010 12:00 PM) CC: Sore throat, dry cough, sinuses, and bloody drainage from nose, URI symptoms   CC:  Sore throat, dry cough, sinuses, and bloody drainage from nose, and URI symptoms.  History of Present Illness:    Onset 11/10/2010 as ST ; she now  reports nasal congestion and purulent nasal discharge with some blood , but denies productive cough and earache.  The patient denies fever, dyspnea, and wheezing.  The patient also reports frontal  headache.  The patient denies the following risk factors for Strep sinusitis: unilateral facial pain, tooth pain, and tender adenopathy.  Rx: tea  Current Medications (verified): 1)  Metformin Hcl 500 Mg Tabs (Metformin Hcl) .Marland Kitchen.. 1 By Mouth Two Times A Day 2)  Ascensia Contour Monitor   Kit (Blood Glucose Monitoring Suppl) .... Use One Time A Day 3)  Vagifem 25 Mcg Tabs (Estradiol) .... 2 X Weekly 4)  Lamictal 200 Mg Tabs (Lamotrigine) .Marland Kitchen.. 1 By Mouth Once Daily 5)  Vaniqa 13.9 % Crea (Eflornithine Hcl) .... Apply Two Times A Day , @ Least 8 Hrs Apart 6)  Claritin 10 Mg Tabs (Loratadine) .Marland Kitchen.. 1 By Mouth Once Daily  Allergies: 1)  Steroids 2)  * Decongestants  Physical Exam  General:  Appears tired but  in no acute distress; alert,appropriate and cooperative throughout examination Ears:  External ear exam shows no significant lesions or deformities.  Otoscopic examination reveals clear canals, tympanic membranes are intact bilaterally without bulging, retraction, inflammation or discharge. Hearing is grossly normal bilaterally. Nose:  External nasal examination shows no deformity or inflammation. Nasal mucosa are  dry without lesions or exudates. Mouth:   Oral mucosa and oropharynx without lesions or exudates.  Teeth in good repair. Lungs:  Normal respiratory effort, chest expands symmetrically. Lungs are clear to auscultation, no crackles or wheezes. Cervical Nodes:  No lymphadenopathy noted Axillary Nodes:  No palpable lymphadenopathy   Impression & Recommendations:  Problem # 1:  SINUSITIS- ACUTE-NOS (ICD-461.9)  Her updated medication list for this problem includes:    Clarithromycin 500 Mg Xr24h-tab (Clarithromycin) .Marland Kitchen... 2 once daily with a meal  Problem # 2:  DIABETES-TYPE 2 (ICD-250.00) FBS < 100 Her updated medication list for this problem includes:    Metformin Hcl 500 Mg Tabs (Metformin hcl) .Marland Kitchen... 1 by mouth two times a day  Complete Medication List: 1)  Metformin Hcl 500 Mg Tabs (Metformin hcl) .Marland Kitchen.. 1 by mouth two times a day 2)  Ascensia Contour Monitor Kit (Blood glucose monitoring suppl) .... Use one time a day 3)  Vagifem 25 Mcg Tabs (Estradiol) .... 2 x weekly 4)  Lamictal 200 Mg Tabs (Lamotrigine) .Marland Kitchen.. 1 by mouth once daily 5)  Vaniqa 13.9 % Crea (Eflornithine hcl) .... Apply two times a day , @ least 8 hrs apart 6)  Claritin 10 Mg Tabs (Loratadine) .Marland Kitchen.. 1 by mouth once daily 7)  Clarithromycin 500 Mg Xr24h-tab (Clarithromycin) .... 2 once daily with a meal  Other Orders: Rapid Strep (10272)  Patient Instructions: 1)  Use Neti pot once daily as needed for congestion.Librarian, academic  once daily as needed for bowel changes. 2)  Drink as much  NON dairy fluid as you can tolerate for the next few days. Prescriptions: CLARITHROMYCIN 500 MG XR24H-TAB (CLARITHROMYCIN) 2 once daily with a meal  #20 x 0   Entered and Authorized by:   Marga Melnick MD   Signed by:   Marga Melnick MD on 11/12/2010   Method used:   Electronically to        [facility] [phone]* (retail)       [address]       [place], [place]  [address]       Ph: [phone] or [phone]       Fax: [phone]   RxID:   [id number]    Orders  Added: 1)  Rapid Strep [36644] 2)  Est. Patient Level III [03474]    Laboratory Results    Other Tests  Rapid Strep: negative

## 2011-01-29 ENCOUNTER — Other Ambulatory Visit: Payer: Self-pay | Admitting: *Deleted

## 2011-01-29 DIAGNOSIS — E119 Type 2 diabetes mellitus without complications: Secondary | ICD-10-CM

## 2011-01-30 ENCOUNTER — Other Ambulatory Visit (INDEPENDENT_AMBULATORY_CARE_PROVIDER_SITE_OTHER): Payer: PRIVATE HEALTH INSURANCE

## 2011-01-30 DIAGNOSIS — E119 Type 2 diabetes mellitus without complications: Secondary | ICD-10-CM

## 2011-02-09 ENCOUNTER — Other Ambulatory Visit: Payer: Self-pay | Admitting: Gynecology

## 2011-02-09 ENCOUNTER — Other Ambulatory Visit: Payer: Self-pay | Admitting: Internal Medicine

## 2011-02-12 ENCOUNTER — Other Ambulatory Visit: Payer: Self-pay | Admitting: Internal Medicine

## 2011-03-06 ENCOUNTER — Telehealth: Payer: Self-pay | Admitting: *Deleted

## 2011-03-06 DIAGNOSIS — Z0000 Encounter for general adult medical examination without abnormal findings: Secondary | ICD-10-CM

## 2011-03-06 NOTE — Telephone Encounter (Signed)
This is fine if [place] on plan

## 2011-03-06 NOTE — Telephone Encounter (Signed)
OK if on plan

## 2011-03-06 NOTE — Telephone Encounter (Signed)
Pt aware referral put in awaiting appt info. 

## 2011-03-06 NOTE — Telephone Encounter (Signed)
Pt states that last  Colonoscopy was done 5 year ago by Dr Kinnie Scales. Pt notes that she is due for colonoscopy now however Pt insurance will not cover for Dr Kinnie Scales to do it. Pt is requesting a referral to [facility] to have colonoscopy done. .Please advise

## 2011-03-09 ENCOUNTER — Encounter: Payer: Self-pay | Admitting: Internal Medicine

## 2011-03-17 ENCOUNTER — Ambulatory Visit (AMBULATORY_SURGERY_CENTER): Payer: PRIVATE HEALTH INSURANCE

## 2011-03-17 VITALS — Ht 64.0 in | Wt 132.5 lb

## 2011-03-17 DIAGNOSIS — Z85048 Personal history of other malignant neoplasm of rectum, rectosigmoid junction, and anus: Secondary | ICD-10-CM

## 2011-03-17 DIAGNOSIS — Z86010 Personal history of colonic polyps: Secondary | ICD-10-CM

## 2011-03-17 MED ORDER — PEG-KCL-NACL-NASULF-NA ASC-C 100 G PO SOLR: 1.0000 | Freq: Once | ORAL | Status: AC

## 2011-03-17 NOTE — Progress Notes (Signed)
Pt states she is sensitive to sedation and does get sick sometimes. She will discuss with the Dr. Christella Hartigan prior to the colonoscopy. Pt also signed  A medical release form to get records from Dr Kinnie Scales and Dr Mirian Mo regarding her last colonoscopy and surgery.Medical release form was given to [facility] . Ulis Rias RN

## 2011-03-18 ENCOUNTER — Encounter: Payer: Self-pay | Admitting: Gastroenterology

## 2011-03-23 ENCOUNTER — Telehealth: Payer: Self-pay | Admitting: Gastroenterology

## 2011-03-23 NOTE — Telephone Encounter (Signed)
Forwarded to Dr. Jacobs for review. °

## 2011-03-25 ENCOUNTER — Telehealth: Payer: Self-pay

## 2011-03-25 NOTE — Telephone Encounter (Signed)
error 

## 2011-03-25 NOTE — Telephone Encounter (Signed)
Colon to stay on schedule as planned.  Per Dr Leone Payor.  Records back on Dr Christella Hartigan desk for review pt aware.

## 2011-03-31 ENCOUNTER — Encounter: Payer: Self-pay | Admitting: Gastroenterology

## 2011-03-31 ENCOUNTER — Ambulatory Visit (AMBULATORY_SURGERY_CENTER): Payer: PRIVATE HEALTH INSURANCE | Admitting: Gastroenterology

## 2011-03-31 DIAGNOSIS — K648 Other hemorrhoids: Secondary | ICD-10-CM

## 2011-03-31 DIAGNOSIS — K644 Residual hemorrhoidal skin tags: Secondary | ICD-10-CM

## 2011-03-31 DIAGNOSIS — Z86010 Personal history of colonic polyps: Secondary | ICD-10-CM

## 2011-03-31 DIAGNOSIS — Z85048 Personal history of other malignant neoplasm of rectum, rectosigmoid junction, and anus: Secondary | ICD-10-CM

## 2011-03-31 DIAGNOSIS — Z1211 Encounter for screening for malignant neoplasm of colon: Secondary | ICD-10-CM

## 2011-03-31 LAB — GLUCOSE, CAPILLARY
Glucose-Capillary: 92 mg/dL (ref 70–99)
Glucose-Capillary: 82 mg/dL (ref 70–99)

## 2011-03-31 MED ORDER — SODIUM CHLORIDE 0.9 % IV SOLN: 500.0000 mL | INTRAVENOUS | Status: DC

## 2011-03-31 NOTE — Patient Instructions (Signed)
Please review discharge instructions  Please read information on hemorrhoids and high fiber diets

## 2011-03-31 NOTE — Progress Notes (Signed)
Blood sugar-82.  Pt denies feelings of low blood sugar.  Snack of crackers and peanut butter given at discharge.

## 2011-04-01 ENCOUNTER — Telehealth: Payer: Self-pay | Admitting: *Deleted

## 2011-04-01 ENCOUNTER — Encounter: Payer: Self-pay | Admitting: Internal Medicine

## 2011-04-01 HISTORY — PX: COLONOSCOPY: SHX174

## 2011-04-01 NOTE — Telephone Encounter (Signed)

## 2011-04-08 ENCOUNTER — Other Ambulatory Visit: Payer: PRIVATE HEALTH INSURANCE | Admitting: Gastroenterology

## 2011-04-20 ENCOUNTER — Ambulatory Visit (INDEPENDENT_AMBULATORY_CARE_PROVIDER_SITE_OTHER): Payer: PRIVATE HEALTH INSURANCE | Admitting: Internal Medicine

## 2011-04-20 ENCOUNTER — Encounter: Payer: Self-pay | Admitting: Internal Medicine

## 2011-04-20 DIAGNOSIS — E785 Hyperlipidemia, unspecified: Secondary | ICD-10-CM

## 2011-04-20 DIAGNOSIS — F329 Major depressive disorder, single episode, unspecified: Secondary | ICD-10-CM

## 2011-04-20 DIAGNOSIS — F3289 Other specified depressive episodes: Secondary | ICD-10-CM

## 2011-04-20 DIAGNOSIS — E119 Type 2 diabetes mellitus without complications: Secondary | ICD-10-CM

## 2011-04-20 MED ORDER — EFLORNITHINE HCL 13.9 % EX CREA: TOPICAL_CREAM | CUTANEOUS | Status: DC

## 2011-04-20 NOTE — Progress Notes (Signed)
Subjective:    Patient ID: Destiny Romero, female    DOB: October 20, 1950, 60 y.o.   MRN: [id number]  HPI#1  Depression, chronic ,with flare due to situational stressors. She will lose her insurance next month. Anxiety:yes, looking for job, going to school, husband has major memory issues Loss of interest (Anhedonia):no Panic attacks:no Insomnia:4 hrs/ night Anorexia:"OK" Fatigue:yes due to sleep issues Neurologic signs/symptoms: some stress headaches; no numbness and tingling, weakness Endocrinologic signs and symptoms: no hoarseness, weight change, vision change, temperature intolerance, bowel changes, skin/hair,/nail changes Medications/efficacy:Lamictal Rxed by Dr Quintella Reichert , [place]. [place] he Rxed lasted only 4 hrs  #2 Diabetes status assessment: Fasting or morning glucose range:  80-110  Highest glucose 2 hours after any meal:  Not checked. Hypoglycemia :  no .                                                     Excess thirst :no;  Excess hunger:  no ;  Excess urination:  no.                                  Lightheadedness with standing:  no. Chest pain:  No  ; Palpitations :no ;  Pain in  calves with walking:  no .                                                                                                                   Non healing skin  ulcers or sores,especially over the feet:  no. Numbness or tingling or burning in feet : no .                                                                                                                                              Significant change in  Weight : stable. Vision changes : no  .  Exercise : 3X/week as cardio & weights (60 min total) . Nutrition/diet:  no. Medication compliance : yes. Medication adverse  Effects:  no . Eye exam : 6/12 ; no retinopathy. Foot care : no.  A1c/ urine microalbumin monitor:  She believes her highest A1c was 6.6; at that time she  weighed 195 pounds. With therapeutic life change, she now weighs 134. Her last A1c was 6.2 in May of this year.           Review of Systems     Objective:   Physical Exam Gen.: Healthy and well-nourished in appearance. Alert, appropriate and cooperative throughout exam. Eyes: No corneal or conjunctival inflammation noted. Neck: No deformities, masses, or tenderness noted. Thyroid normal. Lungs: Normal respiratory effort; chest expands symmetrically. Lungs are clear to auscultation without rales, wheezes, or increased work of breathing. Heart: Normal rate and rhythm. Normal S1 and S2. No gallop, click, or rub. Grade 1/6 systolic  murmur. Abdomen: Bowel sounds normal; abdomen soft and nontender. No masses, organomegaly or hernias noted. She does have an easily auscultated aortic bruit. She is very thin & the aorta is easily palpable. There is no aneurysm present.                                                        Musculoskeletal/extremities: No deformity or scoliosis noted of  the thoracic or lumbar spine. No clubbing, cyanosis, edema, or deformity noted.Nail health  good. Vascular: Carotid, radial artery, dorsalis pedis and  posterior tibial pulses are full and equal. No bruits present. Neurologic: Alert and oriented x3. Deep tendon reflexes symmetrical and normal.Light touch over feet normal.          Skin: Intact without suspicious lesions or rashes. Lymph: No cervical, axillary  lymphadenopathy present. Psych: Mood and affect are normal; clinically not depressed. Normally interactive                                                                                         Assessment & Plan:  #1 situational anxiety/depression. Clinically she is coping extremely well on the present regimen.  #2 diabetes, excellent control  #3 aortic bruit; clinically no aneurysm.   Plan: She should be able to stop the metformin  She should continue the excellent therapeutic life style  program in which she is engaged  Insurance issues may mandate [facility].

## 2011-04-20 NOTE — Patient Instructions (Signed)
Diabetes Monitor   The A1c test is used primarily to monitor the glucose control of diabetics over time. The goal of those with diabetes is to keep their blood glucose levels as close to normal as possible. This helps to minimize the complications caused by chronically elevated glucose levels, such as progressive damage to body organs like the kidneys, eyes, cardiovascular system, and nerves. The A1c test gives a picture of the average amount of glucose in the blood over the last few months. It can help a patient and his doctor know if the measures they are taking to control the patient's diabetes are successful or need to be adjusted.  NORMAL VALUES  Non diabetic adults: 5 %-6.1%  Good diabetic control: 6.2-6.4 %  Fair diabetic control: 6.5-7%  Poor diabetic control: greater than 7 % ( except with additional factors such as  advanced age; significant coronary or neurologic disease,etc). Check the A1c every 6 months if it is < 6.5%; every 4 months if  6.5% or higher. Goals for home glucose monitoring are : fasting  or morning glucose goal of  90-150. Two hours after any meal , goal = < 180, preferably < 150.  Eat a low-fat diet with lots of fruits and vegetables, up to 7-9 servings per day. Consume less than  30  grams of sugar per day from foods & drinks with High Fructose Corn Sugar as #1,2,3 or # 4 on label. Follow the low carb nutrition program in [employer] [place] [employer] as closely as possible to prevent Diabetes progression & complications. White carbohydrates (potatoes, rice, bread, and pasta) have a high spike of sugar and a high load of sugar. For example a  baked potato has a cup of sugar and a  french fry  2 teaspoons of sugar. Yams, wild  rice, whole grained bread &  wheat pasta have been much lower spike and load of  sugar. Portions should be the size of a deck of cards or your palm.

## 2011-04-21 ENCOUNTER — Other Ambulatory Visit: Payer: Self-pay | Admitting: Internal Medicine

## 2011-04-21 DIAGNOSIS — E785 Hyperlipidemia, unspecified: Secondary | ICD-10-CM

## 2011-04-22 ENCOUNTER — Other Ambulatory Visit (INDEPENDENT_AMBULATORY_CARE_PROVIDER_SITE_OTHER): Payer: PRIVATE HEALTH INSURANCE

## 2011-04-22 DIAGNOSIS — E785 Hyperlipidemia, unspecified: Secondary | ICD-10-CM

## 2011-04-22 LAB — LIPID PANEL: VLDL: 13.6 mg/dL (ref 0.0–40.0)

## 2011-04-22 NOTE — Progress Notes (Signed)
Labs only

## 2011-04-28 ENCOUNTER — Other Ambulatory Visit: Payer: Self-pay | Admitting: Internal Medicine

## 2011-04-28 MED ORDER — PRAVASTATIN SODIUM 20 MG PO TABS: 20.0000 mg | ORAL_TABLET | Freq: Every day | ORAL | Status: DC

## 2011-05-06 ENCOUNTER — Encounter: Payer: Self-pay | Admitting: Internal Medicine

## 2011-05-07 ENCOUNTER — Encounter: Payer: Self-pay | Admitting: Internal Medicine

## 2011-05-08 ENCOUNTER — Ambulatory Visit (INDEPENDENT_AMBULATORY_CARE_PROVIDER_SITE_OTHER): Payer: PRIVATE HEALTH INSURANCE | Admitting: Internal Medicine

## 2011-05-08 ENCOUNTER — Encounter: Payer: Self-pay | Admitting: Internal Medicine

## 2011-05-08 VITALS — BP 110/70 | HR 70 | Wt 137.0 lb

## 2011-05-08 DIAGNOSIS — N390 Urinary tract infection, site not specified: Secondary | ICD-10-CM

## 2011-05-08 DIAGNOSIS — E119 Type 2 diabetes mellitus without complications: Secondary | ICD-10-CM

## 2011-05-08 LAB — POCT URINALYSIS DIPSTICK
Bilirubin, UA: NEGATIVE
Glucose, UA: NEGATIVE
Ketones, UA: NEGATIVE
Leukocytes, UA: NEGATIVE

## 2011-05-08 NOTE — Patient Instructions (Signed)
Eat a low-fat diet with lots of fruits and vegetables, up to 7-9 servings per day. Consume less than  30  grams of sugar per day from foods & drinks with High Fructose Corn Sugar as #1,2,3 or # 4 on label. Follow the low carb nutrition program in [employer] [place] [employer] [place] as closely as possible to prevent Diabetes progression & complications. White carbohydrates (potatoes, rice, bread, and pasta) have a high spike of sugar and a high load of sugar. For example a  baked potato has a cup of sugar and a  french fry  2 teaspoons of sugar. Yams, wild  rice, whole grained bread &  wheat pasta have been much lower spike and load of  sugar. Portions should be the size of a deck of cards or your palm.  Please  schedule  A1c & urine microalbumin in 4 months(250.00)

## 2011-05-08 NOTE — Progress Notes (Signed)
  Subjective:    Patient ID: Destiny Romero, female    DOB: October 19, 1950, 60 y.o.   MRN: [id number]  HPI DYSURIA: Onset: 8/16    Worsening: no, improved post Cipro Rxed by [facility]  8/16    Present Symptoms Urgency: no  Frequency: no  Hesitancy: no  Hematuria: no  Flank Pain: no  Fever: no    Nausea/Vomiting: no  Red Flags  : (Risk Factors for Complicated UTI)  More than 3 UTI's last 12 months: no  PMH of  1. DM: yes, A1c 6.2 % in 5/12 2. Renal Disease/Calculi: no 3. Urinary Tract Abnormality: no  4. Instrumentation/Trauma: no  Metformin was stopped after A1c was 6.2%. Since that time her fasting sugars in the range of 90-110.       Review of Systems     Objective:   Physical Exam  She is thin but healthy-appearing, in no distress  Chest is clear without long time, walls of radials.  She has an S4 with a grade 1 systolic murmur; she has no gallop or rub.  Abdomen is nontender no bladder distention. She has no flank pain to percussion.  Skin is clear and healthy without suspicious lesions or rashes.          Assessment & Plan:   #1 urinary tract infection, resolved with Cipro  #2 diabetes, excellent control with therapeutic lifestyle changes  Plan: Monitor fasting blood sugars; goal equals 90-150. 2 hours after any meal, called equals less than 180, ideally less than 160.

## 2011-06-24 ENCOUNTER — Telehealth: Payer: Self-pay | Admitting: *Deleted

## 2011-06-24 NOTE — Telephone Encounter (Signed)
Left msg for pt to return call.   Pt had faxed paper over to let Dr. Alwyn Ren how blood sugar is doing off medication. Per [facility] fasting goal should be 90-150. 2 hours after largest meal < 180, ideally <160 ok to stay off meds 12 weeks and check A1C, BUN, creat, Lipid, urine microablumin-250.00

## 2011-06-25 ENCOUNTER — Other Ambulatory Visit (INDEPENDENT_AMBULATORY_CARE_PROVIDER_SITE_OTHER): Payer: PRIVATE HEALTH INSURANCE

## 2011-06-25 DIAGNOSIS — D7289 Other specified disorders of white blood cells: Secondary | ICD-10-CM

## 2011-06-25 LAB — HEMOGLOBIN A1C: Hgb A1c MFr Bld: 6.4 % (ref 4.6–6.5)

## 2011-06-25 NOTE — Progress Notes (Signed)
Labs only

## 2011-06-25 NOTE — Telephone Encounter (Signed)
Lab appt scheduled.

## 2011-11-11 ENCOUNTER — Telehealth: Payer: Self-pay

## 2011-11-11 NOTE — Telephone Encounter (Signed)
Received fax from patient 11/02/11 regarding needing a OB/GYN as her current one is no longer on her insurance.  Dr. Alwyn Ren commented on her fax and original has been scanned and copy mailed to patient.

## 2011-12-07 HISTORY — PX: OOPHORECTOMY: SHX86

## 2011-12-11 ENCOUNTER — Other Ambulatory Visit: Payer: Self-pay

## 2012-03-28 ENCOUNTER — Other Ambulatory Visit: Payer: Self-pay | Admitting: Gynecology

## 2012-04-25 ENCOUNTER — Encounter: Payer: Self-pay | Admitting: Internal Medicine

## 2012-04-25 ENCOUNTER — Ambulatory Visit (INDEPENDENT_AMBULATORY_CARE_PROVIDER_SITE_OTHER): Payer: PRIVATE HEALTH INSURANCE | Admitting: Internal Medicine

## 2012-04-25 VITALS — BP 112/70 | HR 67 | Temp 98.4°F | Wt 148.0 lb

## 2012-04-25 DIAGNOSIS — J029 Acute pharyngitis, unspecified: Secondary | ICD-10-CM

## 2012-04-25 DIAGNOSIS — E119 Type 2 diabetes mellitus without complications: Secondary | ICD-10-CM

## 2012-04-25 NOTE — Progress Notes (Signed)
  Subjective:    Patient ID: Destiny Romero, female    DOB: 1951-07-11, 61 y.o.   MRN: [id number]  HPI Fasting blood sugars have been averaging 120; she does not check postprandial glucoses on a regular basis.. She denies significant polydipsia, polyphagia, or polyuria. She has no numbness or tingling, or foot lesions. Eye exam is up to date, no retinopathy. Her last A1c was 6.4% in October 2012  She's had a sore throat as of today which has been progressive. It's now associated with some discomfort in the left ear.    Review of Systems She denies chest pain, palpitations, claudication, or edema. She denies frontal headache, facial pain, nasal purulence, or dental pain. She's had no fever, chills, or sweats.  She elected not to take statins; she been prescribed pravastatin when her LDL was 131.5 in August 2012.  She has a great deal of emotional stress related to marital and financial issues. She has an appt  next week with Dr Quintella Reichert who is  prescribing generic Lamictal     Objective:   Physical Exam General appearance:good health ;well nourished; no acute distress or increased work of breathing is present.  No  lymphadenopathy about the head, neck, or axilla noted.   Eyes: No conjunctival inflammation or lid edema is present.   Ears:  External ear exam shows no significant lesions or deformities.  Otoscopic examination reveals clear canals, tympanic membranes are intact bilaterally without bulging, retraction, inflammation or discharge.  Nose:  External nasal examination shows no deformity or inflammation. Nasal mucosa are pink and moist without lesions or exudates. No septal dislocation or deviation.No obstruction to airflow.   Oral exam: Dental hygiene is good; lips and gums are healthy appearing.There is no oropharyngeal erythema or exudate noted.     Heart:  Normal rate and regular rhythm. S1 and S2 normal without gallop, murmur, click, rub or other extra sounds.   Lungs:Chest  clear to auscultation; no wheezes, rhonchi,rales ,or rubs present.No increased work of breathing.    Extremities:  No cyanosis, edema, or clubbing  noted    Skin: Warm & dry .          Assessment & Plan:  #1 diabetes; degree of control needs to be assessed  #2 pharyngitis, present less than 24 hours. She is afebrile and has no exudate or cervical lymphadenopathy.  Plan: See orders and recommendations

## 2012-04-25 NOTE — Patient Instructions (Addendum)
Zicam Melts or Zinc lozenges ; vitamin C 2000 mg daily; & Echinacea for 4-7 days. Report fever, exudate("pus") or progressive pain.  If you activate My Chart; the results can be released to you as soon as they populate from the lab. If you choose not to use this program; the labs have to be reviewed, copied & mailed   causing a delay in getting the results to you.

## 2012-04-26 LAB — MICROALBUMIN / CREATININE URINE RATIO
Creatinine,U: 22.3 mg/dL
Microalb, Ur: 0.2 mg/dL (ref 0.0–1.9)

## 2012-04-26 LAB — HEMOGLOBIN A1C: Hgb A1c MFr Bld: 6.4 % (ref 4.6–6.5)

## 2012-05-12 ENCOUNTER — Encounter: Payer: Self-pay | Admitting: Internal Medicine

## 2012-06-28 ENCOUNTER — Ambulatory Visit (INDEPENDENT_AMBULATORY_CARE_PROVIDER_SITE_OTHER): Payer: PRIVATE HEALTH INSURANCE | Admitting: Internal Medicine

## 2012-06-28 ENCOUNTER — Encounter: Payer: Self-pay | Admitting: Internal Medicine

## 2012-06-28 VITALS — BP 114/72 | HR 77 | Temp 97.8°F | Resp 12 | Ht 64.03 in | Wt 148.0 lb

## 2012-06-28 DIAGNOSIS — E785 Hyperlipidemia, unspecified: Secondary | ICD-10-CM

## 2012-06-28 DIAGNOSIS — Z86010 Personal history of colonic polyps: Secondary | ICD-10-CM

## 2012-06-28 DIAGNOSIS — Z85828 Personal history of other malignant neoplasm of skin: Secondary | ICD-10-CM

## 2012-06-28 DIAGNOSIS — C20 Malignant neoplasm of rectum: Secondary | ICD-10-CM

## 2012-06-28 DIAGNOSIS — Z23 Encounter for immunization: Secondary | ICD-10-CM

## 2012-06-28 DIAGNOSIS — Z0000 Encounter for general adult medical examination without abnormal findings: Secondary | ICD-10-CM

## 2012-06-28 NOTE — Progress Notes (Signed)
  Subjective:    Patient ID: Destiny Romero, female    DOB: 15-Jul-1951, 61 y.o.   MRN: [id number]  HPI  Destiny Romero is here for a physical;acute issues mainly stress related      Review of Systems HYPERLIPIDEMIA: Chest pain, palpitations- no      Dyspnea- no Lightheadedness,Syncope- no    Edema- no Abd pain / bowel changes: no Muscle pain:no Medications: Compliance-  Statin therapy declined   DIABETES: Disease Monitoring: Blood Sugar ranges-not monitored for several months; previously 90-120  Polyuria/phagia/dipsia- no      Visual problems- no Ophth exam: last week ; no retinopathy Medications: Compliance- diet only               Objective:   Physical Exam Gen.: Healthy and well-nourished in appearance. Alert, appropriate and cooperative throughout exam. Head: Normocephalic without obvious abnormalities  Eyes: No corneal or conjunctival inflammation noted. Pupils equal round reactive to light and accommodation.  Extraocular motion intact. Vision grossly normal with lenses. Ears: External  ear exam reveals no significant lesions or deformities. Canals clear .TMs normal. Hearing is grossly normal bilaterally. Nose: External nasal exam reveals no deformity or inflammation. Nasal mucosa are pink and moist. No lesions or exudates noted.  Mouth: Oral mucosa and oropharynx reveal no lesions or exudates. Teeth in good repair. Neck: No deformities, masses, or tenderness noted. Range of motion & Thyroid normal. Lungs: Normal respiratory effort; chest expands symmetrically. Lungs are clear to auscultation without rales, wheezes, or increased work of breathing. Heart: Normal rate and rhythm. Normal S1 and S2. No gallop,  or rub. Click @ apex w/o MR murmur. Abdomen: Bowel sounds normal; abdomen soft and nontender. No masses, organomegaly or hernias noted. Genitalia: Dr Nicholas Lose, [place] [facility]                                                             Musculoskeletal/extremities: There is some  asymmetry of the posterior thoracic musculature suggesting occult scoliosis.  No clubbing, cyanosis, edema, or deformity noted. Range of motion  normal .Tone & strength  normal.Joints normal. Nail health  good. Vascular: Carotid, radial artery, dorsalis pedis and  posterior tibial pulses are full and equal. No bruits present. Neurologic: Alert and oriented x3. Deep tendon reflexes symmetrical and normal.          Skin: Intact without suspicious lesions or rashes. Lymph: No cervical, axillary lymphadenopathy present. Psych: Mood and affect are normal. Normally interactive                                                                                         Assessment & Plan:

## 2012-06-28 NOTE — Patient Instructions (Addendum)
Preventive Health Care: Exercise  30-45  minutes a day, 3-4 days a week. Walking is especially valuable in preventing Osteoporosis. Eat a low-fat diet with lots of fruits and vegetables, up to 7-9 servings per day.  Consume less than 30 grams (preferably ZERO) of sugar per day from foods & drinks with High Fructose Corn Syrup as # 1,2,3 or #4 on label. Eye Doctor - have an eye exam @ least annually Please consider  scheduling fasting Labs : BMET,Lipids, hepatic panel, CBC & dif, TSH, A1c, urine microalbumin. PLEASE BRING THESE INSTRUCTIONS TO FOLLOW UP  LAB APPOINTMENT.This will guarantee correct labs are drawn, eliminating need for repeat blood sampling ( needle sticks ! ). Diagnoses /Codes: V70.0, 250.00

## 2012-08-19 ENCOUNTER — Encounter: Payer: Self-pay | Admitting: Internal Medicine

## 2012-08-19 ENCOUNTER — Ambulatory Visit (INDEPENDENT_AMBULATORY_CARE_PROVIDER_SITE_OTHER): Payer: PRIVATE HEALTH INSURANCE | Admitting: Internal Medicine

## 2012-08-19 VITALS — BP 110/72 | HR 72 | Temp 97.7°F | Wt 153.6 lb

## 2012-08-19 DIAGNOSIS — M1990 Unspecified osteoarthritis, unspecified site: Secondary | ICD-10-CM

## 2012-08-19 DIAGNOSIS — M899 Disorder of bone, unspecified: Secondary | ICD-10-CM

## 2012-08-19 DIAGNOSIS — M8580 Other specified disorders of bone density and structure, unspecified site: Secondary | ICD-10-CM

## 2012-08-19 NOTE — Patient Instructions (Addendum)
Nonsteroidal anti-inflammatory agents such as Indocin and should be taken as infrequently as possible & only with food in  the stomach. These are associated with an increased risk of gastric bleeding and cardiac disease if taken on a regular basis. Arthritis strength Tylenol can be taken at bedtime without risk to the stomach. Protect  hands, feet, and ears from cold exposure including ice chests. Use silk mitten liners; this can be purchased at outdoor supply stores.  Recommended lifestyle interventions for Osteoporosis include calcium 600 mg twice a day  & vitamin D3 supplementation to keep vit D  level @ least 40-60. The usual vitamin D3 dose is 1000 IU daily; but individual dose is determined by annual vitamin D level monitor. Also weight bearing exercise such as  walking 30-45 minutes 3-4  X per week is recommended.

## 2012-08-19 NOTE — Progress Notes (Signed)
  Subjective:    Patient ID: Destiny Romero, female    DOB: [birth year], 61 y.o.   MRN: [id number]  HPI Extremity pain Location:DIP joints of fingers Onset:pain this year especially since Fall Associated symptoms: joint stiffness & swelling Trigger/injury:gripping  & cold exposure Pain quality:dull aching Pain severity:up to 5, 8 with trauma  Duration:hrs Radiation:no Treatment/response:NSAIDS & parafin baths  Help  FH: Osteoporosis in parents  She is concerned about having rheumatoid arthritis        Review of Systems Constitutional: no fever, chills, sweats, change in weight  Musculoskeletal:no  muscle cramps or pain  Skin:no rash, color/ temp change Neuro: no weakness; incontinence (stool/urine); numbness and tingling Heme:no lymphadenopathy; abnormal bruising or bleeding      Objective:   Physical Exam Gen.:  well-nourished in appearance. Alert, appropriate and cooperative throughout exam. Eyes: No corneal or conjunctival inflammation noted.  Neck: No deformities, masses, or tenderness noted. Range of motion excellent.   Heart: Normal rate and rhythm. Normal S1 and S2. No gallop, click, or rub. S4 w/o murmur.                                                                            Musculoskeletal/extremities: No deformity or scoliosis noted of  the thoracic or lumbar spine. No clubbing, cyanosis, edema, or deformity noted. Range of motion  normal .Tone & strength  normal. Nail health  Good. Minor knee crepitus. Neg SLR to 90 degrees Vascular: Radial artery pulses are full and equal. No bruits present. Neurologic: Alert and oriented x3. Deep tendon reflexes symmetrical and normal. Neg Tinel's         Skin: Intact without suspicious lesions or rashes. Lymph: No cervical, axillary lymphadenopathy present. Psych: Mood and affect are normal. 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  Plan: See orders /recommendations

## 2012-08-24 LAB — VITAMIN D 1,25 DIHYDROXY
Vitamin D 1, 25 (OH)2 Total: 40 pg/mL (ref 18–72)
Vitamin D2 1, 25 (OH)2: <8 pg/mL

## 2012-11-01 ENCOUNTER — Telehealth: Payer: Self-pay | Admitting: Internal Medicine

## 2012-11-01 NOTE — Telephone Encounter (Signed)
Pt advise if insurance does not required a referral then she can just call and schedule but if insurance require a referral then she will need OV with [facility].

## 2012-11-01 NOTE — Telephone Encounter (Signed)
pt called is having an issue with her toenail. pt would like to know if hopp would like to see her or if he could recommend a podiatrist cb# [phone]

## 2013-01-27 ENCOUNTER — Encounter (HOSPITAL_COMMUNITY): Payer: Self-pay | Admitting: Emergency Medicine

## 2013-01-27 ENCOUNTER — Emergency Department (HOSPITAL_COMMUNITY)
Admission: EM | Admit: 2013-01-27 | Discharge: 2013-01-27 | Disposition: A | Discharge status: Home / Self Care | Payer: BC Managed Care – PPO | Attending: Emergency Medicine | Admitting: Emergency Medicine

## 2013-01-27 ENCOUNTER — Telehealth: Payer: Self-pay | Admitting: Orthopedic Surgery

## 2013-01-27 ENCOUNTER — Ambulatory Visit: Payer: Self-pay | Admitting: Nurse Practitioner

## 2013-01-27 DIAGNOSIS — Z9079 Acquired absence of other genital organ(s): Secondary | ICD-10-CM | POA: Insufficient documentation

## 2013-01-27 DIAGNOSIS — T7421XA Adult sexual abuse, confirmed, initial encounter: Secondary | ICD-10-CM | POA: Insufficient documentation

## 2013-01-27 DIAGNOSIS — B9689 Other specified bacterial agents as the cause of diseases classified elsewhere: Secondary | ICD-10-CM

## 2013-01-27 DIAGNOSIS — Z872 Personal history of diseases of the skin and subcutaneous tissue: Secondary | ICD-10-CM | POA: Insufficient documentation

## 2013-01-27 DIAGNOSIS — N760 Acute vaginitis: Secondary | ICD-10-CM | POA: Insufficient documentation

## 2013-01-27 DIAGNOSIS — N898 Other specified noninflammatory disorders of vagina: Secondary | ICD-10-CM | POA: Insufficient documentation

## 2013-01-27 DIAGNOSIS — Z8679 Personal history of other diseases of the circulatory system: Secondary | ICD-10-CM | POA: Insufficient documentation

## 2013-01-27 DIAGNOSIS — Z8742 Personal history of other diseases of the female genital tract: Secondary | ICD-10-CM | POA: Insufficient documentation

## 2013-01-27 DIAGNOSIS — E119 Type 2 diabetes mellitus without complications: Secondary | ICD-10-CM | POA: Insufficient documentation

## 2013-01-27 DIAGNOSIS — Z9889 Other specified postprocedural states: Secondary | ICD-10-CM | POA: Insufficient documentation

## 2013-01-27 DIAGNOSIS — Z79899 Other long term (current) drug therapy: Secondary | ICD-10-CM | POA: Insufficient documentation

## 2013-01-27 DIAGNOSIS — N390 Urinary tract infection, site not specified: Secondary | ICD-10-CM | POA: Insufficient documentation

## 2013-01-27 LAB — URINALYSIS, ROUTINE W REFLEX MICROSCOPIC
Bilirubin Urine: NEGATIVE
Ketones, ur: NEGATIVE mg/dL
Nitrite: NEGATIVE
Protein, ur: NEGATIVE mg/dL

## 2013-01-27 LAB — URINE MICROSCOPIC-ADD ON

## 2013-01-27 LAB — WET PREP, GENITAL: Trich, Wet Prep: NONE SEEN

## 2013-01-27 MED ORDER — PROMETHAZINE HCL 25 MG PO TABS
ORAL_TABLET | ORAL | Status: AC
  Filled 2013-01-27: qty 3

## 2013-01-27 MED ORDER — LEVONORGESTREL 0.75 MG PO TABS
ORAL_TABLET | ORAL | Status: AC
  Filled 2013-01-27: qty 2

## 2013-01-27 MED ORDER — METRONIDAZOLE 500 MG PO TABS
ORAL_TABLET | ORAL | Status: AC
  Administered 2013-01-27: 2000 mg
  Filled 2013-01-27: qty 4

## 2013-01-27 MED ORDER — METRONIDAZOLE 500 MG PO TABS: 500.0000 mg | ORAL_TABLET | Freq: Two times a day (BID) | ORAL | Status: DC

## 2013-01-27 MED ORDER — HEPATITIS B VAC RECOMBINANT 5 MCG/0.5ML IJ SUSP
1.0000 mL | Freq: Once | INTRAMUSCULAR | Status: AC
  Administered 2013-01-27: 5 ug via INTRAMUSCULAR
  Filled 2013-01-27: qty 1

## 2013-01-27 MED ORDER — CEPHALEXIN 500 MG PO CAPS: 500.0000 mg | ORAL_CAPSULE | Freq: Four times a day (QID) | ORAL | Status: DC

## 2013-01-27 MED ORDER — CEFIXIME 400 MG PO TABS
ORAL_TABLET | ORAL | Status: AC
  Administered 2013-01-27: 400 mg
  Filled 2013-01-27: qty 1

## 2013-01-27 MED ORDER — HEPATITIS B IMMUNE GLOBULIN IM SOLN
0.0600 mL/kg | Freq: Once | INTRAMUSCULAR | Status: AC
  Administered 2013-01-27: 4.1 mL via INTRAMUSCULAR
  Filled 2013-01-27: qty 5

## 2013-01-27 MED ORDER — AZITHROMYCIN 1 G PO PACK
PACK | ORAL | Status: AC
  Administered 2013-01-27: 1 g
  Filled 2013-01-27: qty 1

## 2013-01-27 NOTE — ED Notes (Signed)
[employer] was notified.

## 2013-01-27 NOTE — ED Notes (Signed)
[employer] here to see pt 
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
## 2013-01-27 NOTE — ED Notes (Signed)
[id number]> Expected date:<BR> Expected time:<BR> Means of arrival:<BR> Comments:<BR> Triage 4

## 2013-01-27 NOTE — ED Notes (Signed)
Pt requested that her friend be allowed back on arrival. Pts friend arrived and taken to pt at this time.
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
## 2013-01-28 LAB — GC/CHLAMYDIA PROBE AMP
CT Probe RNA: NEGATIVE
GC Probe RNA: NEGATIVE
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
## 2013-01-29 LAB — URINE CULTURE

## 2013-01-31 ENCOUNTER — Telehealth: Payer: Self-pay | Admitting: *Deleted

## 2013-01-31 NOTE — Telephone Encounter (Signed)
LMTCB regarding follow up appointment.

## 2013-02-01 ENCOUNTER — Telehealth: Payer: Self-pay | Admitting: Internal Medicine

## 2013-02-01 NOTE — Telephone Encounter (Signed)
Patient is calling inquire about if she is due for her Tetanus injection or needs to come in for one.

## 2013-02-01 NOTE — Telephone Encounter (Signed)
Called patient to schedule. Says she will call back to schedule once she checks her calendar.

## 2013-02-01 NOTE — Telephone Encounter (Signed)
Please schedule patient for a nurse visit for TDaP (Tetanus Vaccine). Patient is due

## 2013-02-02 ENCOUNTER — Encounter: Payer: Self-pay | Admitting: *Deleted

## 2013-02-03 ENCOUNTER — Encounter: Payer: Self-pay | Admitting: *Deleted

## 2013-02-03 ENCOUNTER — Encounter: Payer: Self-pay | Admitting: Nurse Practitioner

## 2013-02-03 ENCOUNTER — Ambulatory Visit (INDEPENDENT_AMBULATORY_CARE_PROVIDER_SITE_OTHER): Payer: BC Managed Care – PPO | Admitting: Nurse Practitioner

## 2013-02-03 VITALS — BP 120/74 | HR 68 | Resp 16 | Wt 152.0 lb

## 2013-02-03 DIAGNOSIS — N390 Urinary tract infection, site not specified: Secondary | ICD-10-CM

## 2013-02-03 DIAGNOSIS — IMO0002 Reserved for concepts with insufficient information to code with codable children: Secondary | ICD-10-CM

## 2013-02-03 DIAGNOSIS — Z23 Encounter for immunization: Secondary | ICD-10-CM

## 2013-02-03 LAB — POCT URINALYSIS DIPSTICK
Bilirubin, UA: NEGATIVE
Blood, UA: NEGATIVE
Glucose, UA: NEGATIVE
Nitrite, UA: NEGATIVE
pH, UA: 7
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
## 2013-02-03 NOTE — Telephone Encounter (Signed)
Call to patient to follow up on status past hospital exam and schedule follow-up.  I did not realize patient had already called and has appointment scheduled for this afternoon.

## 2013-02-06 NOTE — Progress Notes (Signed)
Reviewed personally.  MSM 

## 2013-02-23 ENCOUNTER — Ambulatory Visit (INDEPENDENT_AMBULATORY_CARE_PROVIDER_SITE_OTHER): Payer: BC Managed Care – PPO | Admitting: Certified Nurse Midwife

## 2013-02-23 ENCOUNTER — Encounter: Payer: Self-pay | Admitting: Certified Nurse Midwife

## 2013-02-23 VITALS — BP 104/48 | HR 60 | Resp 12 | Ht 64.0 in | Wt 151.4 lb

## 2013-02-23 DIAGNOSIS — N949 Unspecified condition associated with female genital organs and menstrual cycle: Secondary | ICD-10-CM

## 2013-02-23 DIAGNOSIS — R102 Pelvic and perineal pain: Secondary | ICD-10-CM

## 2013-02-23 DIAGNOSIS — B372 Candidiasis of skin and nail: Secondary | ICD-10-CM

## 2013-02-23 MED ORDER — NYSTATIN 100000 UNIT/GM EX OINT: TOPICAL_OINTMENT | Freq: Two times a day (BID) | CUTANEOUS | Status: DC
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
## 2013-04-06 ENCOUNTER — Telehealth: Payer: Self-pay | Admitting: Obstetrics and Gynecology

## 2013-04-06 NOTE — Telephone Encounter (Signed)
Patient called in for a follow up appt since she is still experiencing a discharge and tenderness. Per Dr. Hyacinth Meeker she needs to be scheduled with one of the Doctors to reevaluate her condition. She is scheduled for 8/1 with Dr. Edward Jolly.

## 2013-04-07 ENCOUNTER — Encounter: Payer: Self-pay | Admitting: Obstetrics and Gynecology

## 2013-04-07 ENCOUNTER — Ambulatory Visit (INDEPENDENT_AMBULATORY_CARE_PROVIDER_SITE_OTHER): Payer: BC Managed Care – PPO | Admitting: Obstetrics and Gynecology

## 2013-04-07 VITALS — BP 104/60 | HR 62 | Ht 64.0 in | Wt 150.0 lb

## 2013-04-07 DIAGNOSIS — B373 Candidiasis of vulva and vagina: Secondary | ICD-10-CM

## 2013-04-07 DIAGNOSIS — B3731 Acute candidiasis of vulva and vagina: Secondary | ICD-10-CM

## 2013-04-07 DIAGNOSIS — N766 Ulceration of vulva: Secondary | ICD-10-CM

## 2013-04-07 MED ORDER — FLUCONAZOLE 150 MG PO TABS: 150.0000 mg | ORAL_TABLET | Freq: Once | ORAL | Status: DC
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
## 2013-04-07 NOTE — Patient Instructions (Signed)
Yeast Infection of the Skin Some yeast on the skin is normal, but sometimes it causes an infection. If you have a yeast infection, it shows up as white or light brown patches on brown skin. You can see it better in the summer on tan skin. It causes light-colored holes in your suntan. It can happen on any area of the body. This cannot be passed from person to person. HOME CARE  Scrub your skin daily with a dandruff shampoo. Your rash may take a couple weeks to get well.  Do not scratch or itch the rash. GET HELP RIGHT AWAY IF:   You get another infection from scratching. The skin may get warm, red, and may ooze fluid.  The infection does not seem to be getting better. MAKE SURE YOU:  Understand these instructions.  Will watch your condition.  Will get help right away if you are not doing well or get worse. Document Released: 08/06/2008 Document Revised: 11/16/2011 Document Reviewed: 08/06/2008 Exit[employer] Patient Information 2014 [employer].  

## 2013-04-17 ENCOUNTER — Telehealth: Payer: Self-pay

## 2013-04-17 NOTE — Telephone Encounter (Signed)
LMOVM to call to discuss further recommendations from Dr. Edward Jolly.

## 2013-04-17 NOTE — Telephone Encounter (Signed)
Patient notified of negative HSV culture.

## 2013-04-17 NOTE — Telephone Encounter (Signed)
LMOVM to call for test results. 

## 2013-04-17 NOTE — Telephone Encounter (Signed)
Returning your call re: results.

## 2013-04-17 NOTE — Telephone Encounter (Signed)
Message copied by Alphonsa Overall on Mon Apr 17, 2013  4:10 PM ------      Message from: Conley Simmonds      Created: Mon Apr 17, 2013  3:58 PM       Affirm testing specifically from the clitoral area maybe very helpful as these symptoms appear to be chronic on chart review. ------

## 2013-04-17 NOTE — Telephone Encounter (Signed)
Message copied by Alphonsa Overall on Mon Apr 17, 2013 10:33 AM ------      Message from: Conley Simmonds      Created: Mon Apr 10, 2013  5:21 PM       Please inform patient that the herpes culture is negative. ------

## 2013-04-19 ENCOUNTER — Encounter: Payer: Self-pay | Admitting: Obstetrics and Gynecology

## 2013-04-19 ENCOUNTER — Ambulatory Visit (INDEPENDENT_AMBULATORY_CARE_PROVIDER_SITE_OTHER): Payer: BC Managed Care – PPO | Admitting: Obstetrics and Gynecology

## 2013-04-19 VITALS — BP 120/70 | HR 76 | Ht 64.0 in | Wt 149.0 lb

## 2013-04-19 DIAGNOSIS — N952 Postmenopausal atrophic vaginitis: Secondary | ICD-10-CM

## 2013-04-19 DIAGNOSIS — I83893 Varicose veins of bilateral lower extremities with other complications: Secondary | ICD-10-CM

## 2013-04-19 DIAGNOSIS — N763 Subacute and chronic vulvitis: Secondary | ICD-10-CM

## 2013-04-19 DIAGNOSIS — N760 Acute vaginitis: Secondary | ICD-10-CM

## 2013-04-19 MED ORDER — ESTRADIOL 0.1 MG/GM VA CREA: TOPICAL_CREAM | VAGINAL | Status: DC

## 2013-04-19 NOTE — Patient Instructions (Signed)
Conjugated Estrogens vaginal cream What is this medicine? CONJUGATED ESTROGENS (CON ju gate ed ESS troe jenz) are a mixture of female hormones. This cream can help relieve symptoms associated with menopause.like vaginal dryness and irritation. This medicine may be used for other purposes; ask your health care provider or pharmacist if you have questions. What should I tell my health care provider before I take this medicine? They need to know if you have any of these conditions: -abnormal vaginal bleeding -blood vessel disease or blood clots -breast, cervical, endometrial, or uterine cancer -dementia -diabetes -gallbladder disease -heart disease or recent heart attack -high blood pressure -high cholesterol -high level of calcium in the blood -hysterectomy -kidney disease -liver disease -migraine headaches -protein C deficiency -protein S deficiency -stroke -systemic lupus erythematosus (SLE) -tobacco smoker -an unusual or allergic reaction to estrogens other medicines, foods, dyes, or preservatives -pregnant or trying to get pregnant -breast-feeding How should I use this medicine? This medicine is for use in the vagina only. Do not take by mouth. Follow the directions on the prescription label. Use at bedtime unless otherwise directed by your doctor or health care professional. Use the special applicator supplied with the cream. Wash hands before and after use. Fill the applicator with the cream and remove from the tube. Lie on your back, part and bend your knees. Insert the applicator into the vagina and push the plunger to expel the cream into the vagina. Wash the applicator with warm soapy water and rinse well. Use exactly as directed for the complete length of time prescribed. Do not stop using except on the advice of your doctor or health care professional. Talk to your pediatrician regarding the use of this medicine in children. Special care may be needed. A patient package  insert for the product will be given with each prescription and refill. Read this sheet carefully each time. The sheet may change frequently. Overdosage: If you think you have taken too much of this medicine contact a poison control center or emergency room at once. NOTE: This medicine is only for you. Do not share this medicine with others. What if I miss a dose? If you miss a dose, use it as soon as you can. If it is almost time for your next dose, use only that dose. Do not use double or extra doses. What may interact with this medicine? Do not take this medicine with any of the following medications: -aromatase inhibitors like aminoglutethimide, anastrozole, exemestane, letrozole, testolactone This medicine may also interact with the following medications: -barbiturates used for inducing sleep or treating seizures -carbamazepine -grapefruit juice -medicines for fungal infections like itraconazole and ketoconazole -raloxifene or tamoxifen -rifabutin -rifampin -rifapentine -ritonavir -some antibiotics used to treat infections -St. John's Wort -warfarin This list may not describe all possible interactions. Give your health care provider a list of all the medicines, herbs, non-prescription drugs, or dietary supplements you use. Also tell them if you smoke, drink alcohol, or use illegal drugs. Some items may interact with your medicine. What should I watch for while using this medicine? Visit your health care professional for regular checks on your progress. You will need a regular breast and pelvic exam. You should also discuss the need for regular mammograms with your health care professional, and follow his or her guidelines. This medicine can make your body retain fluid, making your fingers, hands, or ankles swell. Your blood pressure can go up. Contact your doctor or health care professional if you feel you are retaining   fluid. If you have any reason to think you are pregnant; stop  taking this medicine at once and contact your doctor or health care professional. Tobacco smoking increases the risk of getting a blood clot or having a stroke, especially if you are more than 64 years old. You are strongly advised not to smoke. If you wear contact lenses and notice visual changes, or if the lenses begin to feel uncomfortable, consult your eye care specialist. If you are going to have elective surgery, you may need to stop taking this medicine beforehand. Consult your health care professional for advice prior to scheduling the surgery. What side effects may I notice from receiving this medicine? Side effects that you should report to your doctor or health care professional as soon as possible: -allergic reactions like skin rash, itching or hives, swelling of the face, lips, or tongue -breast tissue changes or discharge -changes in vision -chest pain -confusion, trouble speaking or understanding -dark urine -general ill feeling or flu-like symptoms -light-colored stools -nausea, vomiting -pain, swelling, warmth in the leg -right upper belly pain -severe headaches -shortness of breath -sudden numbness or weakness of the face, arm or leg -trouble walking, dizziness, loss of balance or coordination -unusual vaginal bleeding -yellowing of the eyes or skin Side effects that usually do not require medical attention (report to your doctor or health care professional if they continue or are bothersome): -hair loss -increased hunger or thirst -increased urination -symptoms of vaginal infection like itching, irritation or unusual discharge -unusually weak or tired This list may not describe all possible side effects. Call your doctor for medical advice about side effects. You may report side effects to FDA at 1-800-FDA-1088. Where should I keep my medicine? Keep out of the reach of children. Store at room temperature between 15 and 30 degrees C (59 and 86 degrees F). Throw away  any unused medicine after the expiration date. NOTE: This sheet is a summary. It may not cover all possible information. If you have questions about this medicine, talk to your doctor, pharmacist, or health care provider.  2012, [employer]vier/Gold Standard. (11/26/2010 9:20:36 AM) 

## 2013-04-19 NOTE — Telephone Encounter (Signed)
Patient notified of negative HSV results(see note of 04-17-13)  Appointment made for f/u with Dr. Edward Jolly on 04-19-13.
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
## 2013-04-20 ENCOUNTER — Encounter: Payer: Self-pay | Admitting: Obstetrics and Gynecology

## 2013-04-20 ENCOUNTER — Other Ambulatory Visit: Payer: Self-pay | Admitting: Obstetrics and Gynecology

## 2013-04-20 LAB — WET PREP BY MOLECULAR PROBE
Candida species: NEGATIVE
Trichomonas vaginosis: NEGATIVE

## 2013-04-20 MED ORDER — ESTROGENS, CONJUGATED 0.625 MG/GM VA CREA: TOPICAL_CREAM | Freq: Every day | VAGINAL | Status: DC

## 2013-04-20 NOTE — Telephone Encounter (Signed)
Message to patient through My Chart to pick up her Premarin cream at her pharmacy.  She can come to the office to receive a coupon for Premarin vaginal cream - $15 for the first prescription.

## 2013-04-20 NOTE — Telephone Encounter (Signed)
Patient at pharmacy and rx for estrace is too expensive at $70.00. Patient requests lower cost alternative. [facility] at [facility]. Patient is leaving town and needs as soon as possible.

## 2013-04-20 NOTE — Telephone Encounter (Signed)
Also ok to give samples but let her know we will not be keeping samples in the future.

## 2013-04-21 NOTE — Telephone Encounter (Signed)
Patient notified of samples of Premarin Cream placed at front desk with coupon to pick up. Patient appreciative of this and will come on Monday for the samples.

## 2013-05-01 ENCOUNTER — Encounter: Payer: Self-pay | Admitting: Obstetrics and Gynecology

## 2013-06-29 ENCOUNTER — Other Ambulatory Visit: Payer: Self-pay

## 2013-06-29 DIAGNOSIS — Z1231 Encounter for screening mammogram for malignant neoplasm of breast: Secondary | ICD-10-CM

## 2013-07-07 ENCOUNTER — Encounter: Payer: Self-pay | Admitting: Internal Medicine

## 2013-07-07 ENCOUNTER — Ambulatory Visit (INDEPENDENT_AMBULATORY_CARE_PROVIDER_SITE_OTHER): Payer: BC Managed Care – PPO | Admitting: Internal Medicine

## 2013-07-07 VITALS — BP 106/66 | HR 72 | Temp 97.7°F | Wt 145.0 lb

## 2013-07-07 DIAGNOSIS — H9313 Tinnitus, bilateral: Secondary | ICD-10-CM

## 2013-07-07 DIAGNOSIS — H1010 Acute atopic conjunctivitis, unspecified eye: Secondary | ICD-10-CM

## 2013-07-07 DIAGNOSIS — H9319 Tinnitus, unspecified ear: Secondary | ICD-10-CM

## 2013-07-07 DIAGNOSIS — H1013 Acute atopic conjunctivitis, bilateral: Secondary | ICD-10-CM

## 2013-07-07 MED ORDER — MONTELUKAST SODIUM 10 MG PO TABS: 10.0000 mg | ORAL_TABLET | Freq: Every day | ORAL | Status: DC

## 2013-07-07 NOTE — Progress Notes (Signed)
  Subjective:    Patient ID: Destiny Romero, female    DOB: 1951/07/09, 62 y.o.   MRN: [id number]  HPI   Symptoms began 2-3 weeks ago as a buzzing in the ears much like a "jet plane roar". That improved but she had ringing within the past week in both ears, R > L. There was no excessive aspirin intake or exposure to loud noises.  She questions whether the buzzing sound was related to congestion and strep throat.  She's had frontal headaches as well.  She does have chronic extrinsic symptoms which have not been responsive to over-the-counter antihistamines. This is described as paroxysmal sneezing and watery eyes.    Review of Systems  There's been no associated fever, chills, or sweats. She does not have facial pain or nasal purulence. Additionally there's been no pain in the ears or otic discharge.  She has no associated diplopia, blurred vision, or loss of vision.  She also denies vertigo or imbalance.     Objective:   Physical Exam General appearance:good health ;well nourished; no acute distress or increased work of breathing is present.  No  lymphadenopathy about the head, neck, or axilla noted.   Eyes: No conjunctival inflammation or lid edema is present. Extraocular motion intact. No nystagmus present  Ears:  External ear exam shows no significant lesions or deformities.  Otoscopic examination reveals clear canals, tympanic membranes are intact bilaterally without bulging, retraction, inflammation or discharge. Tuning fork exam is normal with no lateralization. Air conduction is greater than bone conduction. Whisper heard at 6 feet bilaterally  Nose:  External nasal examination shows no deformity or inflammation. Nasal mucosa are pink and moist without lesions or exudates. No septal dislocation or deviation.No obstruction to airflow.   Oral exam: Dental hygiene is good; lips and gums are healthy appearing.There is no oropharyngeal erythema or exudate noted.   Neck:  No  deformities,  masses, or tenderness noted.   Supple with full range of motion without pain.   Heart:  Normal rate and regular rhythm. S1 and S2 normal without gallop, murmur, click, rub or other extra sounds.   Lungs:Chest clear to auscultation; no wheezes, rhonchi,rales ,or rubs present.No increased work of breathing.    Extremities:  No cyanosis, edema, or clubbing  noted    Skin: Warm & dry.  Neuro: Deep tendon reflexes, strength and tone are normal. Romberg testing and fingerand also negative         Assessment & Plan:  #1 tinnitus, bilaterally. This is most likely related to eustachian tube dysfunction in the context of #2  #2 extrinsic rhinoconjunctivitis.  Plan :see orders

## 2013-07-07 NOTE — Patient Instructions (Addendum)
Your next office appointment will be determined based upon response to interventions below. Please report any significant change in your symptoms. Plain Mucinex (NOT D) for thick secretions ;force NON dairy fluids .   Nasal cleansing in the shower as discussed with lather of mild shampoo.After 10 seconds wash off lather while  exhaling through nostrils. Make sure that all residual soap is removed to prevent irritation.  Nasacort AQ OTC 1 spray in each nostril twice a day as needed. Use the "crossover" technique into opposite nostril spraying toward opposite ear @ 45 degree angle, not straight up into nostril.  Use a Neti pot daily only  as needed for significant sinus congestion; going from open side to congested side . Plain Allegra (NOT D )  160 daily , Loratidine 10 mg , OR Zyrtec 10 mg @ bedtime  as needed for itchy eyes & sneezing.

## 2013-07-13 ENCOUNTER — Other Ambulatory Visit: Payer: Self-pay

## 2013-07-13 ENCOUNTER — Encounter: Payer: Self-pay | Admitting: Nurse Practitioner

## 2013-07-24 ENCOUNTER — Ambulatory Visit: Payer: BC Managed Care – PPO | Admitting: Nurse Practitioner

## 2013-07-28 ENCOUNTER — Ambulatory Visit
Admission: RE | Admit: 2013-07-28 | Discharge: 2013-07-28 | Disposition: A | Discharge status: Home / Self Care | Payer: BC Managed Care – PPO | Source: Ambulatory Visit

## 2013-07-28 DIAGNOSIS — Z1231 Encounter for screening mammogram for malignant neoplasm of breast: Secondary | ICD-10-CM

## 2013-07-28 IMAGING — MG MM DIGITAL SCREENING BILAT W/ CAD
4 series · 4 of 4 positions shown · non-contrast
Comparison: Previous exam(s)

ACR Breast Density Category a: The breast tissue is almost entirely
fatty.

CLINICAL DATA: Screening.

EXAM:
DIGITAL SCREENING BILATERAL MAMMOGRAM WITH CAD

[R CC]
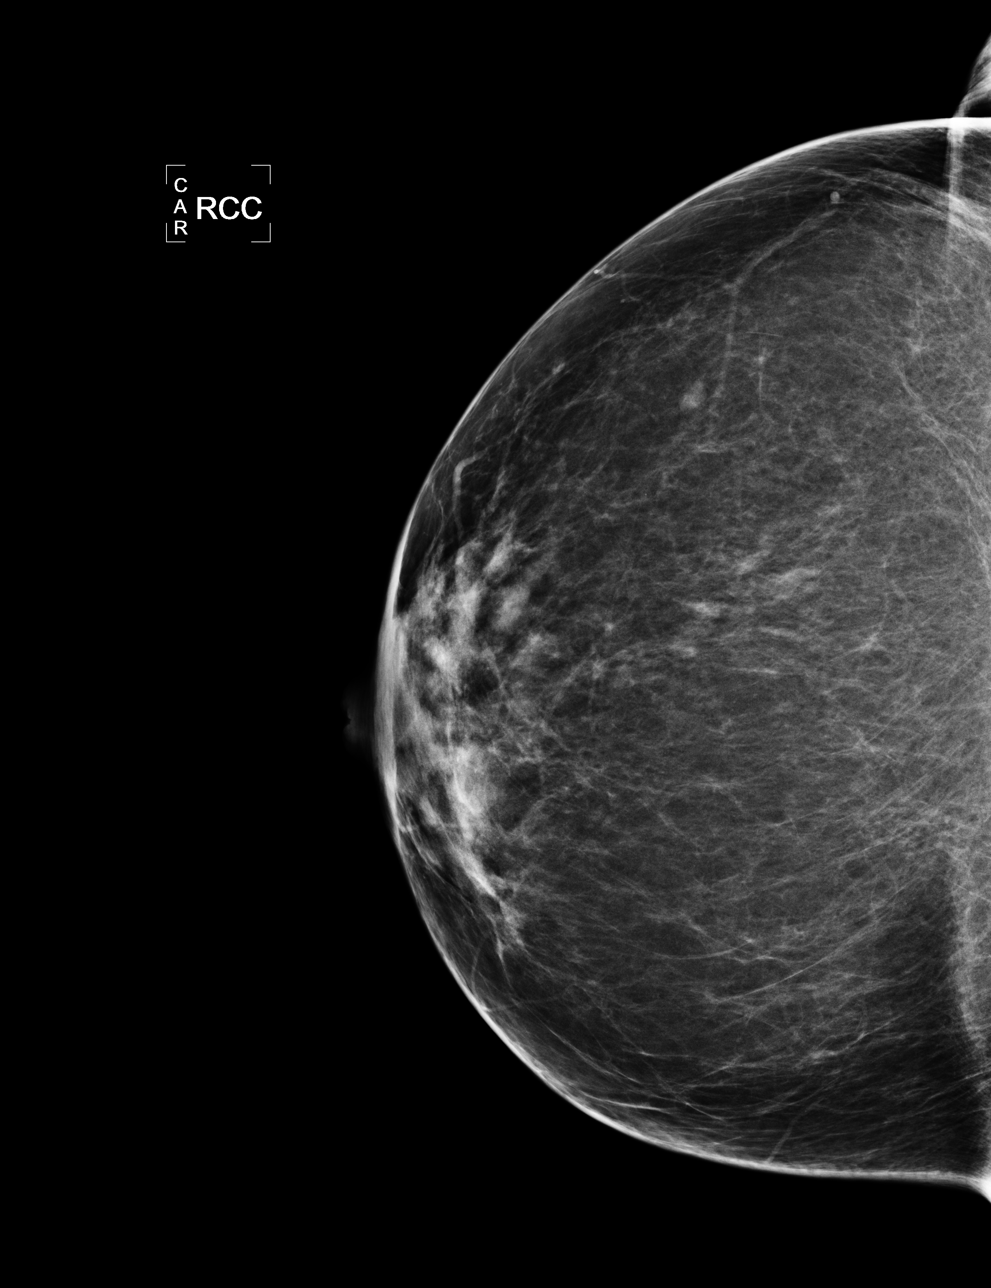

[L CC]
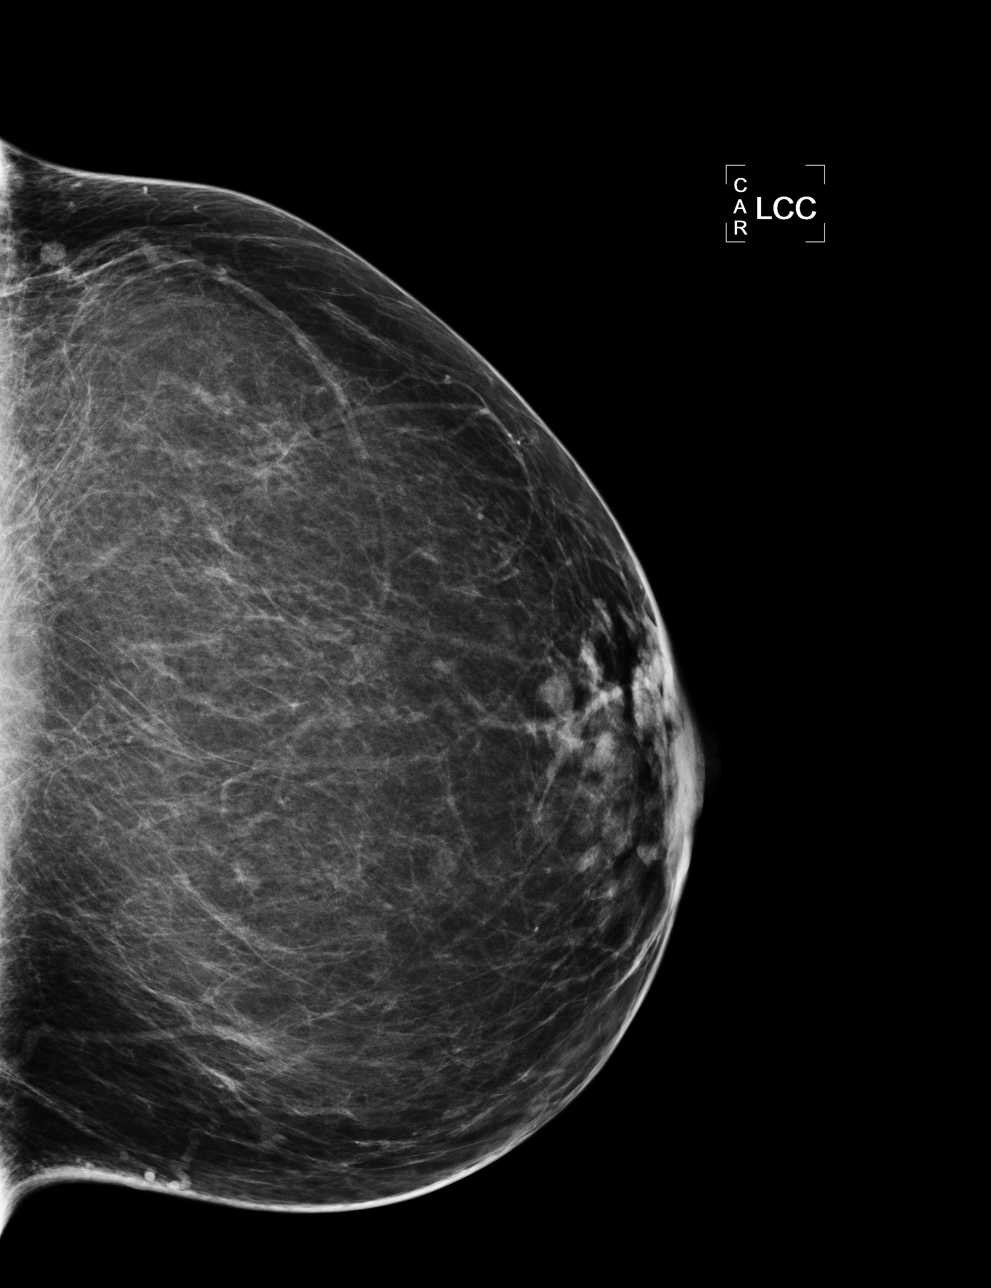

[L MLO]
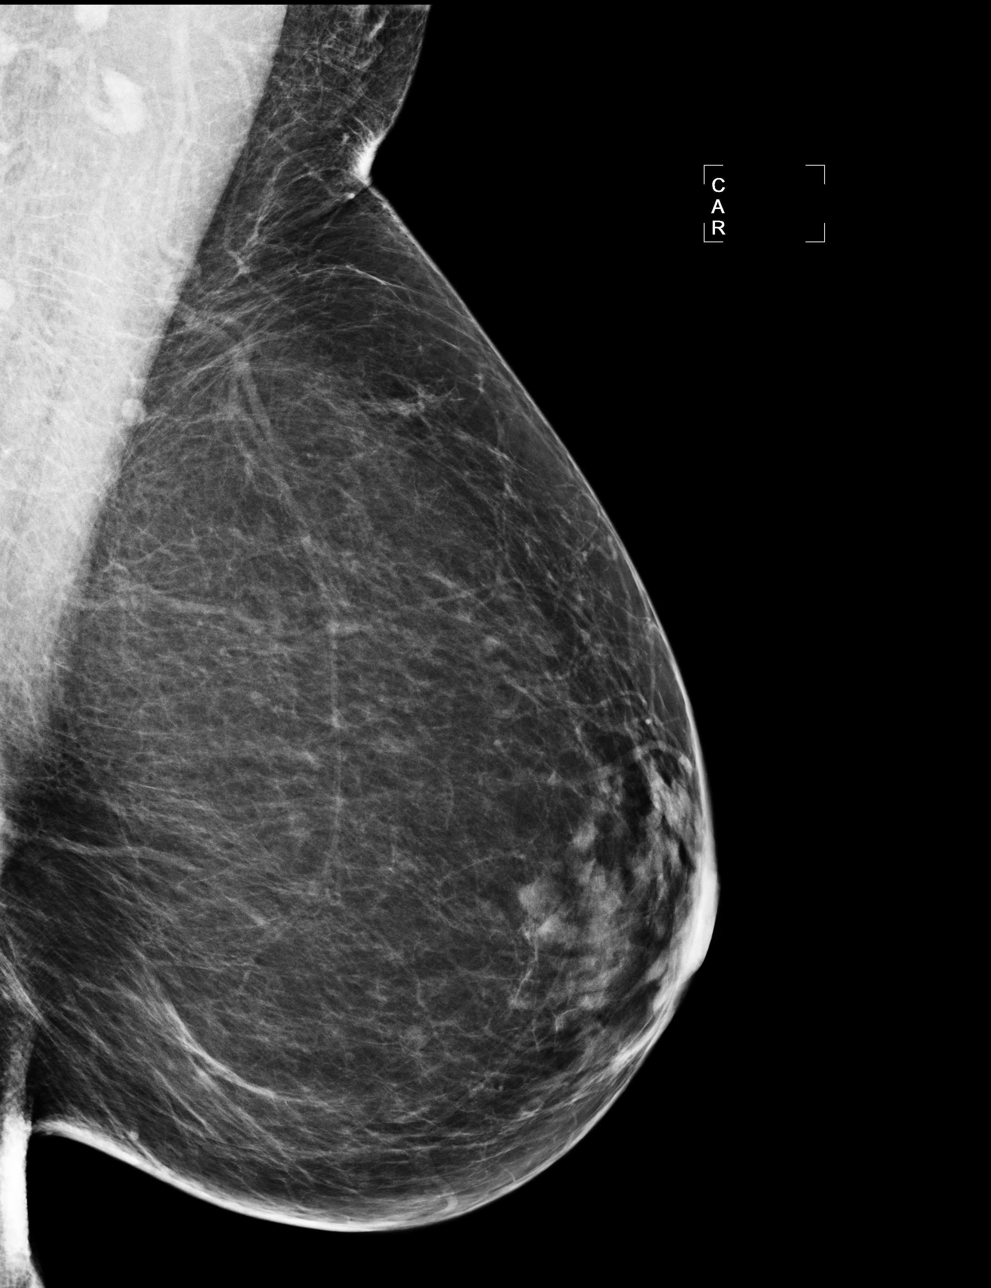

[R MLO]
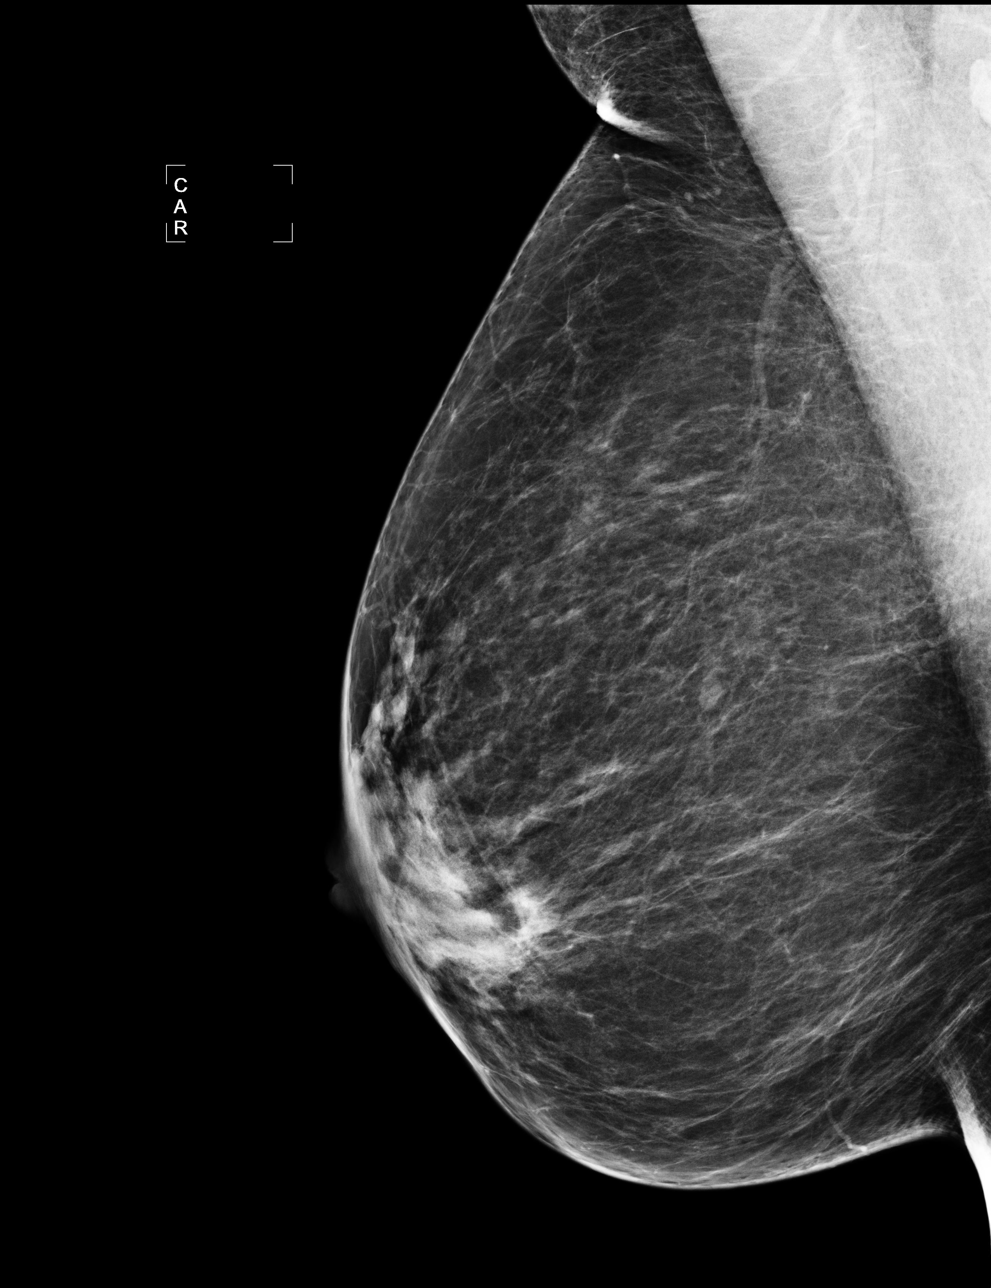

[4 of 4 positions shown; findings below may reference images not displayed]

FINDINGS: There are no findings suspicious for malignancy. Images were
processed with CAD.
IMPRESSION: No mammographic evidence of malignancy. A result letter of this
screening mammogram will be mailed directly to the patient.

RECOMMENDATION:
Screening mammogram in one year. (Code:[ED])

BI-RADS CATEGORY  1: Negative

## 2013-08-01 ENCOUNTER — Ambulatory Visit: Payer: BC Managed Care – PPO | Admitting: Internal Medicine

## 2013-08-11 ENCOUNTER — Encounter: Payer: Self-pay | Admitting: Internal Medicine

## 2013-08-11 ENCOUNTER — Ambulatory Visit (INDEPENDENT_AMBULATORY_CARE_PROVIDER_SITE_OTHER): Payer: BC Managed Care – PPO | Admitting: Internal Medicine

## 2013-08-11 VITALS — BP 100/66 | HR 65 | Temp 98.0°F | Resp 12 | Ht 64.25 in | Wt 146.8 lb

## 2013-08-11 DIAGNOSIS — E785 Hyperlipidemia, unspecified: Secondary | ICD-10-CM

## 2013-08-11 DIAGNOSIS — R7309 Other abnormal glucose: Secondary | ICD-10-CM

## 2013-08-11 DIAGNOSIS — M8580 Other specified disorders of bone density and structure, unspecified site: Secondary | ICD-10-CM

## 2013-08-11 DIAGNOSIS — M899 Disorder of bone, unspecified: Secondary | ICD-10-CM

## 2013-08-11 DIAGNOSIS — R7303 Prediabetes: Secondary | ICD-10-CM

## 2013-08-11 DIAGNOSIS — E11319 Type 2 diabetes mellitus with unspecified diabetic retinopathy without macular edema: Secondary | ICD-10-CM | POA: Insufficient documentation

## 2013-08-11 LAB — LDL CHOLESTEROL, DIRECT: Direct LDL: 146.5 mg/dL

## 2013-08-11 LAB — BASIC METABOLIC PANEL
Calcium: 9.1 mg/dL (ref 8.4–10.5)
GFR: 66.15 mL/min (ref >60.00)
Potassium: 4.2 mEq/L (ref 3.5–5.1)
Sodium: 138 mEq/L (ref 135–145)

## 2013-08-11 LAB — AST: AST: 18 U/L (ref 0–37)

## 2013-08-11 LAB — MICROALBUMIN / CREATININE URINE RATIO
Creatinine,U: 87.4 mg/dL
Microalb Creat Ratio: 0.3 mg/g (ref 0.0–30.0)
Microalb, Ur: 0.3 mg/dL (ref 0.0–1.9)

## 2013-08-11 LAB — TSH: TSH: 1.31 u[IU]/mL (ref 0.35–5.50)

## 2013-08-11 LAB — LIPID PANEL: Total CHOL/HDL Ratio: 3

## 2013-08-11 MED ORDER — METFORMIN HCL 500 MG PO TABS: ORAL_TABLET | ORAL | Status: DC

## 2013-08-11 NOTE — Assessment & Plan Note (Signed)
TSH, lipids,LFTs

## 2013-08-11 NOTE — Patient Instructions (Signed)
Your next office appointment will be determined based upon review of your pending labs . Those instructions will be transmitted to you through My Chart  . Annual podiatry and retinal assessments are indicated because the pre  diabetes.

## 2013-08-11 NOTE — Progress Notes (Signed)
Pre visit review using our clinic review tool, if applicable. No additional management support is needed unless otherwise documented below in the visit note. 

## 2013-08-11 NOTE — Progress Notes (Signed)
Subjective:    Patient ID: Destiny Romero, female    DOB: 1950/10/08, 62 y.o.   MRN: [id number]  HPI  She is here to followup on her hyperglycemia; her A1c has ranged from a low of 6.1-high of 6.4 over the last 3+ years. At this time she is on no medications for diabetes.  Her fasting blood sugars average 110; highest glucose 2 hours after meals 140.  She is on a low carb diet program with very few desserts. She also follows a gluten-free program  She is exercising 2-3 times per week at the gym using the treadmill and lifting weights  Her ophthalmology exam in October 2013 revealed no retinopathy. She has no active podiatry evaluation  She denies any hypoglycemia  Her family history is very strong in reference to diabetes. Her sister who is obese has diabetes as does her brother. He is not overweight. Both grandmothers and 5 maternal aunts were insulin-dependent diabetics.    Review of Systems  She does have some left frontal/temporal headaches which she attributes to sinus issues.  She specifically denies chest pain, palpitations, claudication, as walking dyspnea, or exertional dyspnea.  She's had no postural hypotension symptoms or syncope  She denies edema but does have some prominence of the veins after standing for prolonged period time  She does not have polyuria, polyphagia, or  polydipsia.  She does not experience numbness or tingling or weakness in her extremities  She has no nonhealing skin lesions  Although her ophthalmology exam is not current;she has no symptoms of blurring, diplopia, or loss of vision       Objective:   Physical Exam  Gen.: Healthy and well-nourished in appearance. Alert, appropriate and cooperative throughout exam.Appears younger than stated age  Head: Normocephalic without obvious abnormalities Eyes: No corneal or conjunctival inflammation noted.  Nose: External nasal exam reveals no deformity or inflammation. Nasal mucosa are pink and  moist. No lesions or exudates noted.  Mouth: Oral mucosa and oropharynx reveal no lesions or exudates. Teeth in good repair. Neck: No deformities, masses, or tenderness noted.  Thyroid  normal. Lungs: Normal respiratory effort; chest expands symmetrically. Lungs are clear to auscultation without rales, wheezes, or increased work of breathing. Heart: Normal rate and rhythm. Normal S1 and S2. No gallop, click, or rub. Grade 1/2 systolic  murmur. Abdomen: Bowel sounds normal; abdomen soft and nontender. No masses, organomegaly or hernias noted.                                  Musculoskeletal/extremities: No clubbing, cyanosis, edema, or significant extremity  deformity noted. Range of motion normal .Tone & strength normal. Hand joints normal . Fingernail / toenail health good. Able to lie down & sit up w/o help.  Vascular: Carotid, radial artery, dorsalis pedis and  posterior tibial pulses are full and equal. No bruits present. Neurologic: Alert and oriented x3. Deep tendon reflexes symmetrical and normal.  Light touch normal over feet. Gait normal         Skin: Intact without suspicious lesions or rashes. Lymph: No cervical, axillary lymphadenopathy present. Psych: Mood and affect are normal. Normally interactive  Assessment & Plan:  See Current Assessment & Plan in Problem List under specific Diagnosis

## 2013-08-11 NOTE — Assessment & Plan Note (Signed)
A1c & urine microalbumin  Metformin tentatively will be reinitiated in attempt to prevent progression to diabetes by enhancing the efficacy of her remaining insulin stores. If there is elevated urine microalbumin; low-dose ACE inhibitor would also be recommended.  Annual ophthalmologic and foot exam recommended. At this time there appears be no evidence of any neuropathy

## 2013-08-11 NOTE — Assessment & Plan Note (Signed)
BMD 9/15 Vit D level in 2015

## 2013-08-12 ENCOUNTER — Other Ambulatory Visit: Payer: Self-pay | Admitting: Internal Medicine

## 2013-08-12 DIAGNOSIS — E119 Type 2 diabetes mellitus without complications: Secondary | ICD-10-CM

## 2013-08-12 DIAGNOSIS — E785 Hyperlipidemia, unspecified: Secondary | ICD-10-CM

## 2013-08-17 ENCOUNTER — Ambulatory Visit (INDEPENDENT_AMBULATORY_CARE_PROVIDER_SITE_OTHER): Payer: BC Managed Care – PPO | Admitting: Nurse Practitioner

## 2013-08-17 ENCOUNTER — Encounter: Payer: Self-pay | Admitting: Nurse Practitioner

## 2013-08-17 VITALS — BP 110/72 | HR 64 | Resp 16 | Ht 64.0 in | Wt 146.0 lb

## 2013-08-17 DIAGNOSIS — Z0000 Encounter for general adult medical examination without abnormal findings: Secondary | ICD-10-CM

## 2013-08-17 DIAGNOSIS — Z01419 Encounter for gynecological examination (general) (routine) without abnormal findings: Secondary | ICD-10-CM
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
## 2013-08-17 NOTE — Patient Instructions (Signed)

## 2013-08-21 LAB — IPS PAP TEST WITH HPV

## 2013-08-21 NOTE — Progress Notes (Signed)
Encounter reviewed by Dr. Brook Silva.  

## 2013-08-28 ENCOUNTER — Encounter: Payer: Self-pay | Admitting: Internal Medicine

## 2013-08-29 ENCOUNTER — Encounter: Payer: Self-pay | Admitting: Internal Medicine

## 2013-09-24 ENCOUNTER — Encounter: Payer: Self-pay | Admitting: Internal Medicine

## 2013-09-25 ENCOUNTER — Other Ambulatory Visit: Payer: Self-pay | Admitting: *Deleted

## 2013-09-25 DIAGNOSIS — H9319 Tinnitus, unspecified ear: Secondary | ICD-10-CM

## 2013-09-25 NOTE — Telephone Encounter (Signed)
Please advise 

## 2014-05-07 ENCOUNTER — Telehealth: Payer: Self-pay

## 2014-05-07 NOTE — Telephone Encounter (Signed)
Pt will call back. Pt is finalizing funeral arrangement for her mother.   CPE is needed and all blood work.   DM bundle pt.

## 2014-06-14 ENCOUNTER — Other Ambulatory Visit (INDEPENDENT_AMBULATORY_CARE_PROVIDER_SITE_OTHER): Payer: BC Managed Care – PPO

## 2014-06-14 ENCOUNTER — Other Ambulatory Visit: Payer: BC Managed Care – PPO

## 2014-06-14 ENCOUNTER — Ambulatory Visit (INDEPENDENT_AMBULATORY_CARE_PROVIDER_SITE_OTHER): Payer: BC Managed Care – PPO | Admitting: Internal Medicine

## 2014-06-14 ENCOUNTER — Encounter: Payer: Self-pay | Admitting: Internal Medicine

## 2014-06-14 ENCOUNTER — Other Ambulatory Visit: Payer: Self-pay | Admitting: Internal Medicine

## 2014-06-14 VITALS — BP 100/68 | HR 61 | Temp 97.5°F | Resp 12 | Wt 151.0 lb

## 2014-06-14 DIAGNOSIS — Z860100 Personal history of colon polyps, unspecified: Secondary | ICD-10-CM

## 2014-06-14 DIAGNOSIS — E119 Type 2 diabetes mellitus without complications: Secondary | ICD-10-CM

## 2014-06-14 DIAGNOSIS — L659 Nonscarring hair loss, unspecified: Secondary | ICD-10-CM

## 2014-06-14 DIAGNOSIS — R6889 Other general symptoms and signs: Secondary | ICD-10-CM

## 2014-06-14 DIAGNOSIS — Z23 Encounter for immunization: Secondary | ICD-10-CM

## 2014-06-14 DIAGNOSIS — E785 Hyperlipidemia, unspecified: Secondary | ICD-10-CM

## 2014-06-14 LAB — BASIC METABOLIC PANEL
BUN: 15 mg/dL (ref 6–23)
CALCIUM: 9.4 mg/dL (ref 8.4–10.5)
CO2: 30 meq/L (ref 19–32)
CREATININE: 1.1 mg/dL (ref 0.4–1.2)
Chloride: 103 mEq/L (ref 96–112)
GFR: 54.73 mL/min — AB (ref >60.00)
GLUCOSE: 114 mg/dL — AB (ref 70–99)
Potassium: 4.5 mEq/L (ref 3.5–5.1)
Sodium: 140 mEq/L (ref 135–145)

## 2014-06-14 LAB — CBC WITH DIFFERENTIAL/PLATELET
Basophils Absolute: 0 10*3/uL (ref 0.0–0.1)
Basophils Relative: 0.4 % (ref 0.0–3.0)
EOS ABS: 0.3 10*3/uL (ref 0.0–0.7)
EOS PCT: 3.7 % (ref 0.0–5.0)
HCT: 39.5 % (ref 36.0–46.0)
Hemoglobin: 13 g/dL (ref 12.0–15.0)
LYMPHS ABS: 3.9 10*3/uL (ref 0.7–4.0)
Lymphocytes Relative: 45.3 % (ref 12.0–46.0)
MCHC: 32.8 g/dL (ref 30.0–36.0)
MCV: 86.3 fl (ref 78.0–100.0)
MONO ABS: 0.6 10*3/uL (ref 0.1–1.0)
Monocytes Relative: 6.7 % (ref 3.0–12.0)
Neutro Abs: 3.8 10*3/uL (ref 1.4–7.7)
Neutrophils Relative %: 43.9 % (ref 43.0–77.0)
PLATELETS: 302 10*3/uL (ref 150.0–400.0)
RBC: 4.58 Mil/uL (ref 3.87–5.11)
RDW: 14.5 % (ref 11.5–15.5)
WBC: 8.6 10*3/uL (ref 4.0–10.5)

## 2014-06-14 LAB — TSH: TSH: 1.63 u[IU]/mL (ref 0.35–4.50)

## 2014-06-14 LAB — MICROALBUMIN / CREATININE URINE RATIO
Creatinine,U: 123.5 mg/dL
Microalb Creat Ratio: 0.3 mg/g (ref 0.0–30.0)
Microalb, Ur: 0.4 mg/dL (ref 0.0–1.9)

## 2014-06-14 LAB — HEMOGLOBIN A1C: Hgb A1c MFr Bld: 6.5 % (ref 4.6–6.5)

## 2014-06-14 MED ORDER — METFORMIN HCL 500 MG PO TABS: ORAL_TABLET | ORAL | Status: DC

## 2014-06-14 NOTE — Assessment & Plan Note (Signed)
Colonoscopy reported as UTD

## 2014-06-14 NOTE — Progress Notes (Signed)
Pre visit review using our clinic review tool, if applicable. No additional management support is needed unless otherwise documented below in the visit note. 

## 2014-06-14 NOTE — Patient Instructions (Signed)
Your next office appointment will be determined based upon review of your pending labs . Those instructions will be transmitted to you through My Chart .  Followup as needed for your acute issue. Please report any significant change in your symptoms. 

## 2014-06-14 NOTE — Progress Notes (Signed)
Subjective:    Patient ID: Destiny Romero, female    DOB: 07/20/1951, 63 y.o.   MRN: [id number]  HPI  Diabetes status assessment: Fasting or morning glucose range 90-120; average 100. Highest glucose 2 hours after any meal rarely checked but < 140. No hypoglycemia reported                                                                                                                 Exercise described as 3  times per week for 45 minutes as CVE. No specific nutrition/diet followed but diet "balanced " Medication compliance is good. No medication adverse effects noted. Eye exam not current. Foot care current  A1c monitor  12/14  Was 6.6% (average sugar 158 with long-term  32%  risk )   Destiny Romero has cold intolerance & hair loss.  Her allergies are controlled with over-the-counter Chlor-Trimeton. Destiny Romero no longer takes Singulair.  Review of Systems  No excess thirst ;  excess hunger ; or excess urination reported                              No lightheadedness with standing reported No chest pain ; palpitations ; claudication described .                                                                                                                        No non healing skin  ulcers or sores of extremities noted. No numbness or burning in feet described .Rare "burning in toes"                                                                                                                                           No significant change in weight . No blurred,double, or loss of vision reported  .      Extrinsic  symptoms of itchy, watery eyes, sneezing, or angioedema are denied. There is no significant cough, sputum production, wheezing,or  paroxysmal nocturnal dyspnea.       Objective:   Physical Exam Appears healthy and well-nourished & in no acute distress Thyroid slightly irregular without nodularity or enlargement. Extraocular motion is intact. Destiny Romero has minor vertical unsustained  nystagmus Destiny Romero has no lid lag or proptosis.  No carotid bruits are present.No neck vein distention present at 10 - 15 degrees.  Heart rhythm and rate are normal with no gallop or murmur Chest is clear with no increased work of breathing There is no evidence of aortic aneurysm or renal artery bruits Abdomen soft with no organomegaly or masses. No HJR No clubbing, cyanosis or edema present. Pedal pulses are intact  No ischemic skin changes are present . Fingernails/ toenails healthy . No onycholysis No tremor present.Alert and oriented. Strength, tone, DTRs reflexes normal          Assessment & Plan:  #1: Readings suggests excellent  DM control  #2 cold intolerance  #3 hair loss  #4 extrinsic rhinoconjunctivitis, controlled with over-the-counter medicines  Plan: See orders and recommendations

## 2014-06-16 LAB — NMR LIPOPROFILE WITH LIPIDS
Cholesterol, Total: 252 mg/dL — ABNORMAL HIGH (ref 100–199)
HDL Particle Number: 37.1 umol/L (ref >=30.5)
HDL SIZE: 9.9 nm (ref >=9.2)
HDL-C: 88 mg/dL (ref >39)
LARGE VLDL-P: 3 nmol/L — AB (ref <=2.7)
LDL (calc): 149 mg/dL — ABNORMAL HIGH (ref 0–99)
LDL PARTICLE NUMBER: 1291 nmol/L — AB (ref <1000)
LDL Size: 22.1 nm (ref >=20.8)
LP-IR Score: <25 (ref <=45)
Large HDL-P: 13.5 umol/L (ref >=4.8)
SMALL LDL PARTICLE NUMBER: 116 nmol/L (ref <=527)
Triglycerides: 75 mg/dL (ref 0–149)
VLDL Size: 40.2 nm (ref <=46.6)

## 2014-06-17 ENCOUNTER — Encounter: Payer: Self-pay | Admitting: Internal Medicine

## 2014-06-18 ENCOUNTER — Encounter: Payer: Self-pay | Admitting: Internal Medicine

## 2014-09-06 ENCOUNTER — Emergency Department (HOSPITAL_COMMUNITY)
Admission: EM | Admit: 2014-09-06 | Discharge: 2014-09-06 | Disposition: A | Discharge status: Home / Self Care | Payer: Medicare HMO | Attending: Emergency Medicine | Admitting: Emergency Medicine

## 2014-09-06 ENCOUNTER — Emergency Department (HOSPITAL_COMMUNITY): Payer: Medicare HMO

## 2014-09-06 ENCOUNTER — Encounter (HOSPITAL_COMMUNITY): Payer: Self-pay | Admitting: Emergency Medicine

## 2014-09-06 DIAGNOSIS — Z79899 Other long term (current) drug therapy: Secondary | ICD-10-CM | POA: Insufficient documentation

## 2014-09-06 DIAGNOSIS — Z8679 Personal history of other diseases of the circulatory system: Secondary | ICD-10-CM | POA: Diagnosis not present

## 2014-09-06 DIAGNOSIS — Y9389 Activity, other specified: Secondary | ICD-10-CM | POA: Diagnosis not present

## 2014-09-06 DIAGNOSIS — W11XXXA Fall on and from ladder, initial encounter: Secondary | ICD-10-CM | POA: Insufficient documentation

## 2014-09-06 DIAGNOSIS — Y998 Other external cause status: Secondary | ICD-10-CM | POA: Insufficient documentation

## 2014-09-06 DIAGNOSIS — S6992XA Unspecified injury of left wrist, hand and finger(s), initial encounter: Secondary | ICD-10-CM | POA: Diagnosis present

## 2014-09-06 DIAGNOSIS — Z87448 Personal history of other diseases of urinary system: Secondary | ICD-10-CM | POA: Insufficient documentation

## 2014-09-06 DIAGNOSIS — Z85828 Personal history of other malignant neoplasm of skin: Secondary | ICD-10-CM | POA: Insufficient documentation

## 2014-09-06 DIAGNOSIS — Y9289 Other specified places as the place of occurrence of the external cause: Secondary | ICD-10-CM | POA: Diagnosis not present

## 2014-09-06 DIAGNOSIS — F329 Major depressive disorder, single episode, unspecified: Secondary | ICD-10-CM | POA: Insufficient documentation

## 2014-09-06 MED ORDER — HYDROCODONE-ACETAMINOPHEN 5-325 MG PO TABS: 1.0000 | ORAL_TABLET | Freq: Four times a day (QID) | ORAL | Status: DC | PRN

## 2014-09-06 NOTE — ED Notes (Signed)
SPLINT INTACT TO LEFT WRIST. GOOD CMS

## 2014-09-06 NOTE — Discharge Instructions (Signed)
Possible Scaphoid Fracture, Wrist A fracture is a break in the bone. The bone you have broken often does not show up as a fracture on x-ray until later on in the healing phase. This bone is called the scaphoid bone. With this bone, your caregiver will often cast or splint your wrist as though it is fractured, even if a fracture is not seen on the x-ray. This is often done with wrist injuries in which there is tenderness at the base of the thumb. An x-ray at 1-3 weeks after your injury may confirm this fracture. A cast or splint is used to protect and keep your injured bone in good position for healing. The cast or splint will be on generally for about 6 to 16 weeks, depending on your health, age, the fracture location and how quickly you heal. Another name for the scaphoid bone is the navicular bone. HOME CARE INSTRUCTIONS   To lessen the swelling and pain, keep the injured part elevated above your heart while sitting or lying down.  Apply ice to the injury for 15-20 minutes, 03-04 times per day while awake, for 2 days. Put the ice in a plastic bag and place a thin towel between the bag of ice and your cast.  If you have a plaster or fiberglass cast or splint:  Do not try to scratch the skin under the cast using sharp or pointed objects.  Check the skin around the cast every day. You may put lotion on any red or sore areas.  Keep your cast or splint dry and clean.  If you have a plaster splint:  Wear the splint as directed.  You may loosen the elastic bandage around the splint if your fingers become numb, tingle, or turn cold or blue.  If you have been put in a removable splint, wear and use as directed.  Do not put pressure on any part of your cast or splint; it may deform or break. Rest your cast or splint only on a pillow the first 24 hours until it is fully hardened.  Your cast or splint can be protected during bathing with a plastic bag. Do not lower the cast or splint into  water.  Only take over-the-counter or prescription medicines for pain, discomfort, or fever as directed by your caregiver.  If your caregiver has given you a follow up appointment, it is very important to keep that appointment. Not keeping the appointment could result in chronic pain and decreased function. If there is any problem keeping the appointment, you must call back to this facility for assistance. SEEK IMMEDIATE MEDICAL CARE IF:   Your cast gets damaged, wet or breaks.  You have continued severe pain or more swelling than you did before the cast or splint was put on.  Your skin or nails below the injury turn blue or gray, or feel cold or numb.  You have tingling or burning pain in your fingers or increasing pain with movement of your fingers Document Released: 08/14/2002 Document Revised: 11/16/2011 Document Reviewed: 04/12/2009 [facility] Patient Information 2015 [place], [place]. This information is not intended to replace advice given to you by your health care provider. Make sure you discuss any questions you have with your health care provider.    Cast or Splint Care Casts and splints support injured limbs and keep bones from moving while they heal. It is important to care for your cast or splint at home.  HOME CARE INSTRUCTIONS  Keep the cast or splint  uncovered during the drying period. It can take 24 to 48 hours to dry if it is made of plaster. A fiberglass cast will dry in less than 1 hour.  Do not rest the cast on anything harder than a pillow for the first 24 hours.  Do not put weight on your injured limb or apply pressure to the cast until your health care provider gives you permission.  Keep the cast or splint dry. Wet casts or splints can lose their shape and may not support the limb as well. A wet cast that has lost its shape can also create harmful pressure on your skin when it dries. Also, wet skin can become infected.  Cover the cast or splint with a plastic  bag when bathing or when out in the rain or snow. If the cast is on the trunk of the body, take sponge baths until the cast is removed.  If your cast does become wet, dry it with a towel or a blow dryer on the cool setting only.  Keep your cast or splint clean. Soiled casts may be wiped with a moistened cloth.  Do not place any hard or soft foreign objects under your cast or splint, such as cotton, toilet paper, lotion, or powder.  Do not try to scratch the skin under the cast with any object. The object could get stuck inside the cast. Also, scratching could lead to an infection. If itching is a problem, use a blow dryer on a cool setting to relieve discomfort.  Do not trim or cut your cast or remove padding from inside of it.  Exercise all joints next to the injury that are not immobilized by the cast or splint. For example, if you have a long leg cast, exercise the hip joint and toes. If you have an arm cast or splint, exercise the shoulder, elbow, thumb, and fingers.  Elevate your injured arm or leg on 1 or 2 pillows for the first 1 to 3 days to decrease swelling and pain.It is best if you can comfortably elevate your cast so it is higher than your heart. SEEK MEDICAL CARE IF:   Your cast or splint cracks.  Your cast or splint is too tight or too loose.  You have unbearable itching inside the cast.  Your cast becomes wet or develops a soft spot or area.  You have a bad smell coming from inside your cast.  You get an object stuck under your cast.  Your skin around the cast becomes red or raw.  You have new pain or worsening pain after the cast has been applied. SEEK IMMEDIATE MEDICAL CARE IF:   You have fluid leaking through the cast.  You are unable to move your fingers or toes.  You have discolored (blue or white), cool, painful, or very swollen fingers or toes beyond the cast.  You have tingling or numbness around the injured area.  You have severe pain or pressure  under the cast.  You have any difficulty with your breathing or have shortness of breath.  You have chest pain. Document Released: 08/21/2000 Document Revised: 06/14/2013 Document Reviewed: 03/02/2013 [facility] Patient Information 2015 [place], [place]. This information is not intended to replace advice given to you by your health care provider. Make sure you discuss any questions you have with your health care provider.    RICE: Routine Care for Injuries The routine care of many injuries includes Rest, Ice, Compression, and Elevation (RICE). HOME CARE INSTRUCTIONS  Rest is needed to allow your body to heal. Routine activities can usually be resumed when comfortable. Injured tendons and bones can take up to 6 weeks to heal. Tendons are the cord-like structures that attach muscle to bone.  Ice following an injury helps keep the swelling down and reduces pain.  Put ice in a plastic bag.  Place a towel between your skin and the bag.  Leave the ice on for 15-20 minutes, 3-4 times a day, or as directed by your health care provider. Do this while awake, for the first 24 to 48 hours. After that, continue as directed by your caregiver.  Compression helps keep swelling down. It also gives support and helps with discomfort. If an elastic bandage has been applied, it should be removed and reapplied every 3 to 4 hours. It should not be applied tightly, but firmly enough to keep swelling down. Watch fingers or toes for swelling, bluish discoloration, coldness, numbness, or excessive pain. If any of these problems occur, remove the bandage and reapply loosely. Contact your caregiver if these problems continue.  Elevation helps reduce swelling and decreases pain. With extremities, such as the arms, hands, legs, and feet, the injured area should be placed near or above the level of the heart, if possible. SEEK IMMEDIATE MEDICAL CARE IF:  You have persistent pain and swelling.  You develop redness,  numbness, or unexpected weakness.  Your symptoms are getting worse rather than improving after several days. These symptoms may indicate that further evaluation or further X-rays are needed. Sometimes, X-rays may not show a small broken bone (fracture) until 1 week or 10 days later. Make a follow-up appointment with your caregiver. Ask when your X-ray results will be ready. Make sure you get your X-ray results. Document Released: 12/06/2000 Document Revised: 08/29/2013 Document Reviewed: 01/23/2011 [facility] Patient Information 2015 [place], [place]. This information is not intended to replace advice given to you by your health care provider. Make sure you discuss any questions you have with your health care provider.   Wrist Sprain with Rehab A sprain is an injury in which a ligament that maintains the proper alignment of a joint is partially or completely torn. The ligaments of the wrist are susceptible to sprains. Sprains are classified into three categories. Grade 1 sprains cause pain, but the tendon is not lengthened. Grade 2 sprains include a lengthened ligament because the ligament is stretched or partially ruptured. With grade 2 sprains there is still function, although the function may be diminished. Grade 3 sprains are characterized by a complete tear of the tendon or muscle, and function is usually impaired. SYMPTOMS   Pain tenderness, inflammation, and/or bruising (contusion) of the injury.  A "pop" or tear felt and/or heard at the time of injury.  Decreased wrist function. CAUSES  A wrist sprain occurs when a force is placed on one or more ligaments that is greater than it/they can withstand. Common mechanisms of injury include:  Catching a ball with you hands.  Repetitive and/ or strenuous extension or flexion of the wrist. RISK INCREASES WITH:  Previous wrist injury.  Contact sports (boxing or wrestling).  Activities in which falling is common.  Poor strength and  flexibility.  Improperly fitted or padded protective equipment. PREVENTION  Warm up and stretch properly before activity.  Allow for adequate recovery between workouts.  Maintain physical fitness:  Strength, flexibility, and endurance.  Cardiovascular fitness.  Protect the wrist joint by limiting its motion with the use of taping, braces,  or splints.  Protect the wrist after injury for 6 to 12 months. PROGNOSIS  The prognosis for wrist sprains depends on the degree of injury. Grade 1 sprains require 2 to 6 weeks of treatment. Grade 2 sprains require 6 to 8 weeks of treatment, and grade 3 sprains require up to 12 weeks.  RELATED COMPLICATIONS   Prolonged healing time, if improperly treated or re-injured.  Recurrent symptoms that result in a chronic problem.  Injury to nearby structures (bone, cartilage, nerves, or tendons).  Arthritis of the wrist.  Inability to compete in athletics at a high level.  Wrist stiffness or weakness.  Progression to a complete rupture of the ligament. TREATMENT  Treatment initially involves resting from any activities that aggravate the symptoms, and the use of ice and medications to help reduce pain and inflammation. Your caregiver may recommend immobilizing the wrist for a period of time in order to reduce stress on the ligament and allow for healing. After immobilization it is important to perform strengthening and stretching exercises to help regain strength and a full range of motion. These exercises may be completed at home or with a therapist. Surgery is not usually required for wrist sprains, unless the ligament has been ruptured (grade 3 sprain). MEDICATION   If pain medication is necessary, then nonsteroidal anti-inflammatory medications, such as aspirin and ibuprofen, or other minor pain relievers, such as acetaminophen, are often recommended.  Do not take pain medication for 7 days before surgery.  Prescription pain relievers may be  given if deemed necessary by your caregiver. Use only as directed and only as much as you need. HEAT AND COLD  Cold treatment (icing) relieves pain and reduces inflammation. Cold treatment should be applied for 10 to 15 minutes every 2 to 3 hours for inflammation and pain and immediately after any activity that aggravates your symptoms. Use ice packs or massage the area with a piece of ice (ice massage).  Heat treatment may be used prior to performing the stretching and strengthening activities prescribed by your caregiver, physical therapist, or athletic trainer. Use a heat pack or soak your injury in warm water. SEEK MEDICAL CARE IF:  Treatment seems to offer no benefit, or the condition worsens.  Any medications produce adverse side effects.

## 2014-09-06 NOTE — ED Notes (Signed)
Pt with Hx of cancer tripped off of ladder today, fell and landed on left wrist. Motor function and sensation intact, capillary refill is < 2 seconds. Denies LOC, denies head injury, denies any chemotherapy or radiation in past year.

## 2014-09-06 NOTE — ED Provider Notes (Addendum)
TIME SEEN: 10:05 AM  CHIEF COMPLAINT: Left wrist pain  HPI: Pt is a 63 y.o. F who is right-hand-dominant who slipped off of a stepladder at 7 PM last night and caught herself with her left hand behind her. Denies hitting her head or losing consciousness. She's been having left wrist pain, swelling and ecchymosis since. No other injury. No numbness, tingling or focal weakness. Not on anticoagulation.  ROS: See HPI Constitutional: no fever  Eyes: no drainage  ENT: no runny nose   Cardiovascular:  no chest pain  Resp: no SOB  GI: no vomiting GU: no dysuria Integumentary: no rash  Allergy: no hives  Musculoskeletal: no leg swelling  Neurological: no slurred speech ROS otherwise negative  PAST MEDICAL HISTORY/PAST SURGICAL HISTORY:  Past Medical History  Diagnosis Date  . Cervical dysplasia 8/92    Dr Rica Koyanagi - [facility]  . Allergy   . Mitral valve prolapse     NO MR; no SBE prophylaxis needed  . Squamous cell carcinoma in situ of skin     Dr [place]  . Depression   . Pre-diabetes     highest A1c 6.4 %    MEDICATIONS:  Prior to Admission medications   Medication Sig Start Date End Date Taking? Authorizing Provider  chlorpheniramine (CHLOR-TRIMETON) 2 MG/5ML syrup Take 4 mg by mouth.    Historical Provider, MD  diphenhydrAMINE (BENADRYL) 25 MG tablet Take 25 mg by mouth every 6 (six) hours as needed for itching.    Historical Provider, MD  lamoTRIgine (LAMICTAL) 200 MG tablet Take 200 mg by mouth daily.      Historical Provider, MD  metFORMIN (GLUCOPHAGE) 500 MG tablet 1 pill with largest 06/14/14   Hendricks Limes, MD    ALLERGIES:  Allergies  Allergen Reactions  . Cortisone Palpitations and Other (See Comments)    hyperactivity  . Pseudoephedrine     tachycardia    SOCIAL HISTORY:  History  Substance Use Topics  . Smoking status: Never Smoker   . Smokeless tobacco: Never Used  . Alcohol Use: Yes     Comment: rarely    FAMILY HISTORY: Family History  Problem  Relation Age of Onset  . Diabetes Mother   . Prostate cancer Father   . COPD Father   . Heart disease Father     CAD - used nitroglycerin tablets  . Diabetes Sister     obese  . Thyroid cancer Sister   . Diabetes Brother     not obese  . Lymphoma Brother     NHL  . Diabetes Maternal Grandmother     IDDM  . Colon cancer Neg Hx   . Esophageal cancer Neg Hx   . Stomach cancer Neg Hx   . Stroke Neg Hx   . Diabetes Paternal Grandmother     IDDM  . Diabetes Maternal Aunt     X5 ; all IDDM  . Heart disease Maternal Aunt     "enlarged heart"  . Heart disease Maternal Uncle     "enlarged heart"  . Heart disease Paternal Grandfather     CAD - used nitroglycerin tablets    EXAM: BP 112/66 mmHg  Pulse 70  Temp(Src) 97.7 F (36.5 C) (Oral)  Resp 16  SpO2 100% CONSTITUTIONAL: Alert and oriented and responds appropriately to questions. Well-appearing; well-nourished; GCS 15 HEAD: Normocephalic; atraumatic EYES: Conjunctivae clear, PERRL, EOMI ENT: normal nose; no rhinorrhea; moist mucous membranes; pharynx without lesions noted; no dental injury; no septal  hematoma NECK: Supple, no meningismus, no LAD; no midline spinal tenderness, step-off or deformity CARD: RRR; S1 and S2 appreciated; no murmurs, no clicks, no rubs, no gallops RESP: Normal chest excursion without splinting or tachypnea; breath sounds clear and equal bilaterally; no wheezes, no rhonchi, no rales; chest wall stable, nontender to palpation ABD/GI: Normal bowel sounds; non-distended; soft, non-tender, no rebound, no guarding PELVIS:  stable, nontender to palpation BACK:  The back appears normal and is non-tender to palpation, there is no CVA tenderness; no midline spinal tenderness, step-off or deformity EXT: Tender to palpation diffusely over the left wrist with snuffbox tenderness, there is diffuse swelling and ecchymosis, no tenderness over the left hand or left proximal forearm or elbow, decreased range of motion  in the left wrist secondary to pain, 2+ radial pulses bilaterally, sensation to light touch intact diffusely, otherwise Normal ROM in all joints; otherwise extremities are non-tender to palpation; no edema; normal capillary refill; no cyanosis    SKIN: Normal color for age and race; warm NEURO: Moves all extremities equally, sensation to light touch intact diffusely, cranial nerves II through XII intact, normal gait PSYCH: The patient's mood and manner are appropriate. Grooming and personal hygiene are appropriate.  MEDICAL DECISION MAKING: Patient here with mechanical fall last night with left wrist injury. We'll obtain x-ray. She declines pain medication at this time stating she took naproxen prior to arrival.  ED PROGRESS: X-ray shows no acute injury. However she does have significant tenderness at her scaphoid on the left side. Will place in thumb spica and have her follow-up with her PCP or hand surgery in one week to have reevaluation, repeat x-rays. I discussed elevation, ice. We'll discharge with prescription for Vicodin to use as needed for pain. Discussed return precautions. She verbalizes understanding and is comfortable with plan.    SPLINT APPLICATION Date/Time: 41:66 AM Authorized by: Nyra Jabs Consent: Verbal consent obtained. Risks and benefits: risks, benefits and alternatives were discussed Consent given by: patient Splint applied by: orthopedic technician Location details: Left wrist  Splint type: Thumb spica  Supplies used: velcro wrist splint Post-procedure: The splinted body part was neurovascularly unchanged following the procedure. Patient tolerance: Patient tolerated the procedure well with no immediate complications.        [place], DO 09/06/14 [place], DO 09/06/14 1129

## 2014-09-13 ENCOUNTER — Encounter: Payer: Self-pay | Admitting: Internal Medicine

## 2014-09-13 ENCOUNTER — Ambulatory Visit (INDEPENDENT_AMBULATORY_CARE_PROVIDER_SITE_OTHER): Payer: Medicare HMO | Admitting: Internal Medicine

## 2014-09-13 VITALS — BP 110/76 | HR 65 | Temp 97.6°F | Resp 14 | Ht 64.0 in | Wt 148.2 lb

## 2014-09-13 DIAGNOSIS — S6992XS Unspecified injury of left wrist, hand and finger(s), sequela: Secondary | ICD-10-CM

## 2014-09-13 NOTE — Patient Instructions (Addendum)
Use warm moist compresses  4 times a day to the affected area.Elevate L hand as much as possible.  The  Hand Surgery referral will be scheduled and you'll be notified of the time.Please call the Referral Co-Ordinator @ [phone] if you have not been notified of appointment time within 7-10 days.

## 2014-09-13 NOTE — Progress Notes (Signed)
Pre visit review using our clinic review tool, if applicable. No additional management support is needed unless otherwise documented below in the visit note. 

## 2014-09-13 NOTE — Progress Notes (Signed)
   Subjective:    Patient ID: Destiny Romero, female    DOB: 1951/03/02, 64 y.o.   MRN: [id number]  HPI  She experienced a mechanical fall from a stepstool 09/06/14. She landed on the left hand apparently with the wrist flexed.  This resulted in pain and swelling in this area.  There was no cardiac or neurologic prodrome prior to the fall.  In the emergency room scaphoid tenderness was noted but x-rays were negative. She was placed in a thumb spica splint. Vicodin was prescribed but this has not been taken ;she's been taking Aleve  The bruising has improved but the pain persist up to level VI-VII with flexion of the wrist    Review of Systems  Denied were any change in heart rhythm or rate prior to the event. There was no associated chest pain or shortness of breath .  Also specifically denied prior to the episode were headache, limb weakness, tingling, or numbness. No seizure activity noted.     Objective:   Physical Exam There is faint ecchymosis measuring 5 x 2.5 cm of the medial ventral wrist.  There is visible swelling of the lateral ventral wrist.  There is tenderness to palpation over the distal lateral wrist proximal to the thumb.  She experiences significant pain with  Wrist flexion.  Radial artery pulse is normal  She has no epitrochlear, axillary, cervical lymphadenopathy.         Assessment & Plan:  #1 wrist trauma in a mechanical fall  Plan: Hand surgery consultation for the residual pain and swelling.  Moist warm compresses and elevation recommended

## 2014-09-16 ENCOUNTER — Encounter: Payer: Self-pay | Admitting: Internal Medicine

## 2014-09-18 ENCOUNTER — Ambulatory Visit: Payer: Medicare HMO | Admitting: Sports Medicine

## 2015-02-25 ENCOUNTER — Encounter: Payer: Self-pay | Admitting: Internal Medicine

## 2015-03-19 ENCOUNTER — Ambulatory Visit (INDEPENDENT_AMBULATORY_CARE_PROVIDER_SITE_OTHER): Payer: Medicare HMO | Admitting: Internal Medicine

## 2015-03-19 ENCOUNTER — Encounter: Payer: Self-pay | Admitting: Internal Medicine

## 2015-03-19 ENCOUNTER — Other Ambulatory Visit (INDEPENDENT_AMBULATORY_CARE_PROVIDER_SITE_OTHER): Payer: Medicare HMO

## 2015-03-19 VITALS — BP 106/70 | HR 60 | Temp 97.7°F | Resp 14 | Wt 158.0 lb

## 2015-03-19 DIAGNOSIS — E119 Type 2 diabetes mellitus without complications: Secondary | ICD-10-CM | POA: Diagnosis not present

## 2015-03-19 DIAGNOSIS — E785 Hyperlipidemia, unspecified: Secondary | ICD-10-CM

## 2015-03-19 DIAGNOSIS — Z86010 Personal history of colonic polyps: Secondary | ICD-10-CM

## 2015-03-19 LAB — CBC WITH DIFFERENTIAL/PLATELET
BASOS ABS: 0 10*3/uL (ref 0.0–0.1)
Basophils Relative: 0.5 % (ref 0.0–3.0)
EOS ABS: 0.5 10*3/uL (ref 0.0–0.7)
EOS PCT: 7.5 % — AB (ref 0.0–5.0)
HEMATOCRIT: 37 % (ref 36.0–46.0)
Hemoglobin: 12.3 g/dL (ref 12.0–15.0)
Lymphocytes Relative: 43.4 % (ref 12.0–46.0)
Lymphs Abs: 3.1 10*3/uL (ref 0.7–4.0)
MCHC: 33.3 g/dL (ref 30.0–36.0)
MCV: 85.2 fl (ref 78.0–100.0)
MONOS PCT: 7.1 % (ref 3.0–12.0)
Monocytes Absolute: 0.5 10*3/uL (ref 0.1–1.0)
NEUTROS PCT: 41.5 % — AB (ref 43.0–77.0)
Neutro Abs: 3 10*3/uL (ref 1.4–7.7)
Platelets: 275 10*3/uL (ref 150.0–400.0)
RBC: 4.34 Mil/uL (ref 3.87–5.11)
RDW: 14.7 % (ref 11.5–15.5)
WBC: 7.1 10*3/uL (ref 4.0–10.5)

## 2015-03-19 LAB — TSH: TSH: 2.37 u[IU]/mL (ref 0.35–4.50)

## 2015-03-19 LAB — BASIC METABOLIC PANEL
BUN: 16 mg/dL (ref 6–23)
CHLORIDE: 104 meq/L (ref 96–112)
CO2: 31 mEq/L (ref 19–32)
Calcium: 9.4 mg/dL (ref 8.4–10.5)
Creatinine, Ser: 1.03 mg/dL (ref 0.40–1.20)
GFR: 57.05 mL/min — ABNORMAL LOW (ref >60.00)
Glucose, Bld: 127 mg/dL — ABNORMAL HIGH (ref 70–99)
Potassium: 4.1 mEq/L (ref 3.5–5.1)
Sodium: 142 mEq/L (ref 135–145)

## 2015-03-19 LAB — LIPID PANEL
Cholesterol: 240 mg/dL — ABNORMAL HIGH (ref 0–200)
HDL: 74.5 mg/dL (ref >39.00)
LDL Cholesterol: 150 mg/dL — ABNORMAL HIGH (ref 0–99)
NonHDL: 165.5
Total CHOL/HDL Ratio: 3
Triglycerides: 80 mg/dL (ref 0.0–149.0)
VLDL: 16 mg/dL (ref 0.0–40.0)

## 2015-03-19 LAB — HEMOGLOBIN A1C: Hgb A1c MFr Bld: 6.6 % — ABNORMAL HIGH (ref 4.6–6.5)

## 2015-03-19 LAB — HEPATIC FUNCTION PANEL
ALBUMIN: 4.2 g/dL (ref 3.5–5.2)
ALT: 15 U/L (ref 0–35)
AST: 19 U/L (ref 0–37)
Alkaline Phosphatase: 57 U/L (ref 39–117)
Bilirubin, Direct: 0.2 mg/dL (ref 0.0–0.3)
TOTAL PROTEIN: 7.2 g/dL (ref 6.0–8.3)
Total Bilirubin: 0.9 mg/dL (ref 0.2–1.2)

## 2015-03-19 LAB — MICROALBUMIN / CREATININE URINE RATIO
Creatinine,U: 231.9 mg/dL
Microalb Creat Ratio: 0.3 mg/g (ref 0.0–30.0)
Microalb, Ur: 0.7 mg/dL (ref 0.0–1.9)

## 2015-03-19 NOTE — Assessment & Plan Note (Signed)
A1c , urine microalbumin, BMET 

## 2015-03-19 NOTE — Patient Instructions (Signed)
  Your next office appointment will be determined based upon review of your pending labs. Those written interpretation of the lab results and instructions will be transmitted to you by My Chart  Critical results will be called.   Followup as needed for any active or acute issue. Please report any significant change in your symptoms. 

## 2015-03-19 NOTE — Progress Notes (Signed)
Pre visit review using our clinic review tool, if applicable. No additional management support is needed unless otherwise documented below in the visit note. 

## 2015-03-19 NOTE — Progress Notes (Signed)
   Subjective:    Patient ID: Destiny Romero, female    DOB: Apr 21, 1951, 64 y.o.   MRN: [id number]  HPI   The patient is here to assess status of active health conditions.  PMH, FH, & Social History reviewed & updated.  She has been off the metformin approximately 3 months after the prescription ran out. She chose not to refill the medicine. She's not been checking her sugars as her meter broke.  Off metformin paroxysmal dizziness has resolved. She was having this approximately 3 times per week.  She does eat increased amounts of fruits and vegetables. She broke her wrist in late December 2015 and has not exercised regularly since. She also has not adhered to a low-carb diet.  Her eye exam is up-to-date.  Review of Systems  Chest pain, palpitations, tachycardia, exertional dyspnea, paroxysmal nocturnal dyspnea, claudication or edema are absent. No unexplained weight loss, abdominal pain, significant dyspepsia, dysphagia, melena, rectal bleeding, or persistently small caliber stools. Dysuria, pyuria, hematuria, frequency, nocturia or polyuria are denied. Change in hair, skin, nails denied. No bowel changes of constipation or diarrhea. No intolerance to heat or cold.     Objective:   Physical Exam  Pertinent or positive findings include: She has slight bilateral ptosis. She has minimal crepitus of the knees.  General appearance :adequately nourished; in no distress.  Eyes: No conjunctival inflammation or scleral icterus is present.  Oral exam:  Lips and gums are healthy appearing.There is no oropharyngeal erythema or exudate noted. Dental hygiene is good.  Heart:  Normal rate and regular rhythm. S1 and S2 normal without gallop, murmur, click, rub or other extra sounds    Lungs:Chest clear to auscultation; no wheezes, rhonchi,rales ,or rubs present.No increased work of breathing.   Abdomen: bowel sounds normal, soft and non-tender without masses, organomegaly or hernias noted.  No  guarding or rebound.   Vascular : all pulses equal ; no bruits present.  Skin:Warm & dry.  Intact without suspicious lesions or rashes ; no tenting or jaundice   Lymphatic: No lymphadenopathy is noted about the head, neck, axilla   Neuro: Strength, tone & DTRs normal.         Assessment & Plan:  See Current Assessment & Plan in Problem List under specific Diagnosis

## 2015-03-19 NOTE — Assessment & Plan Note (Signed)
CBC

## 2015-03-19 NOTE — Assessment & Plan Note (Signed)
Lipids, LFTs, TSH  

## 2015-04-11 ENCOUNTER — Encounter: Payer: Self-pay | Admitting: Internal Medicine

## 2015-04-11 NOTE — Telephone Encounter (Signed)
OV recommended

## 2015-04-15 ENCOUNTER — Encounter: Payer: Self-pay | Admitting: Internal Medicine

## 2015-04-15 ENCOUNTER — Ambulatory Visit (INDEPENDENT_AMBULATORY_CARE_PROVIDER_SITE_OTHER): Payer: Medicare HMO | Admitting: Internal Medicine

## 2015-04-15 VITALS — BP 104/66 | HR 59 | Temp 97.8°F | Resp 16 | Wt 157.0 lb

## 2015-04-15 DIAGNOSIS — E119 Type 2 diabetes mellitus without complications: Secondary | ICD-10-CM | POA: Diagnosis not present

## 2015-04-15 NOTE — Progress Notes (Signed)
Pre visit review using our clinic review tool, if applicable. No additional management support is needed unless otherwise documented below in the visit note. 

## 2015-04-15 NOTE — Patient Instructions (Signed)
  The following nutritional changes may help prevent Diabetes progression & complications.  White carbohydrates (potatoes, rice, bread, and pasta) cause a high spike of the sugar level which stays elevated for a significant period of time (called sugar"load").  For example a  baked potato has a cup of sugar and a  french fry  2 teaspoons of sugar.  More complex carbs such as yams, wild  rice, whole grained bread &  wheat pasta have been much lower spike and persistent load of sugar than the white carbs. The pancreas excretes excess insulin in response to the high spike & load of sugar . Over time the pancreas can actually run out of insulin necessitating insulin shots. Cardiovascular exercise, this can be as simple a program as walking, is recommended 30-45 minutes 3-4 times per week.

## 2015-04-15 NOTE — Progress Notes (Signed)
   Subjective:    Patient ID: Destiny Romero, female    DOB: 11/19/1950, 64 y.o.   MRN: [id number]  HPI  She is here for follow-up of her diabetes. She stopped her metformin several months ago. She actually had run out. She did not refill it because she felt it was causing dizziness.  In the last month fasting blood sugars range from a low of 125-high of 141. She has no hypoglycemia. Her A1c was 6.6% on 03/19/15. She tells me she was not on metformin that time.  She is on a heart healthy but not carbohydrate restricted diet. She does cardio and weights at least twice a week for 30-45 minutes or more without cardio pulmonary symptoms.  Ophthalmologic exam is up-to-date and negative for retinopathy. She's not had a podiatry evaluation.  She has occasional tingling in her toes but nothing of significance.   Review of Systems  Polyuria, polyphagia, polydipsia absent.  There is no blurred vision, double vision, or loss of vision.   No postural dizziness noted. Denied are numbness,persistent  tingling, or burning of the extremities.  No nonhealing skin lesions present.  Weight is stable.       Objective:   Physical Exam  General appearance is one of good health and nourishment w/o distress.  Eyes: No conjunctival inflammation or scleral icterus is present.  Heart:  Normal rate and regular rhythm. S1 and S2 normal without gallop, murmur, click, rub or other extra sounds     Lungs:Chest clear to auscultation; no wheezes, rhonchi,rales ,or rubs present.No increased work of breathing.   Abdomen: bowel sounds normal, soft and non-tender without masses, organomegaly or hernias noted.  No guarding or rebound .   Musculoskeletal: Able to lie flat and sit up without help. Negative straight leg raising bilaterally. Gait normal  Skin:Warm & dry.  Intact without suspicious lesions or rashes ; no jaundice or tenting  Lymphatic: No lymphadenopathy is noted about the head, neck, axilla               Assessment & Plan:  See Current Assessment & Plan in Problem List under specific Diagnosis

## 2015-04-15 NOTE — Assessment & Plan Note (Signed)
Focus will be on nutrition and exercise. Repeat A1c in October. If diabetes is uncontrolled; consider pioglitazone.

## 2015-04-22 ENCOUNTER — Encounter: Payer: Self-pay | Admitting: Internal Medicine

## 2015-04-23 MED ORDER — BLOOD GLUCOSE MONITOR KIT: PACK | Status: DC

## 2015-04-26 ENCOUNTER — Encounter: Payer: Self-pay | Admitting: Internal Medicine

## 2015-04-26 ENCOUNTER — Other Ambulatory Visit: Payer: Self-pay

## 2015-04-26 MED ORDER — BLOOD GLUCOSE MONITOR KIT: PACK | Status: DC

## 2015-07-10 ENCOUNTER — Ambulatory Visit: Payer: Self-pay | Admitting: Internal Medicine

## 2015-07-11 ENCOUNTER — Other Ambulatory Visit (INDEPENDENT_AMBULATORY_CARE_PROVIDER_SITE_OTHER): Payer: Medicare HMO

## 2015-07-11 ENCOUNTER — Ambulatory Visit (INDEPENDENT_AMBULATORY_CARE_PROVIDER_SITE_OTHER): Payer: Medicare HMO | Admitting: Internal Medicine

## 2015-07-11 ENCOUNTER — Encounter: Payer: Self-pay | Admitting: Internal Medicine

## 2015-07-11 VITALS — BP 110/70 | HR 62 | Temp 97.9°F | Ht 64.0 in | Wt 156.0 lb

## 2015-07-11 DIAGNOSIS — E119 Type 2 diabetes mellitus without complications: Secondary | ICD-10-CM | POA: Diagnosis not present

## 2015-07-11 DIAGNOSIS — Z1159 Encounter for screening for other viral diseases: Secondary | ICD-10-CM

## 2015-07-11 DIAGNOSIS — Z86010 Personal history of colonic polyps: Secondary | ICD-10-CM | POA: Diagnosis not present

## 2015-07-11 DIAGNOSIS — E785 Hyperlipidemia, unspecified: Secondary | ICD-10-CM | POA: Diagnosis not present

## 2015-07-11 DIAGNOSIS — Z23 Encounter for immunization: Secondary | ICD-10-CM | POA: Diagnosis not present

## 2015-07-11 LAB — CBC WITH DIFFERENTIAL/PLATELET
BASOS PCT: 0.4 % (ref 0.0–3.0)
Basophils Absolute: 0 10*3/uL (ref 0.0–0.1)
EOS ABS: 0.4 10*3/uL (ref 0.0–0.7)
EOS PCT: 3.9 % (ref 0.0–5.0)
HEMATOCRIT: 35.9 % — AB (ref 36.0–46.0)
HEMOGLOBIN: 11.7 g/dL — AB (ref 12.0–15.0)
LYMPHS PCT: 37.6 % (ref 12.0–46.0)
Lymphs Abs: 3.5 10*3/uL (ref 0.7–4.0)
MCHC: 32.5 g/dL (ref 30.0–36.0)
MCV: 84.2 fl (ref 78.0–100.0)
Monocytes Absolute: 0.7 10*3/uL (ref 0.1–1.0)
Monocytes Relative: 8 % (ref 3.0–12.0)
Neutro Abs: 4.6 10*3/uL (ref 1.4–7.7)
Neutrophils Relative %: 50.1 % (ref 43.0–77.0)
Platelets: 299 10*3/uL (ref 150.0–400.0)
RBC: 4.26 Mil/uL (ref 3.87–5.11)
RDW: 14.4 % (ref 11.5–15.5)
WBC: 9.2 10*3/uL (ref 4.0–10.5)

## 2015-07-11 LAB — HEPATITIS C ANTIBODY: HCV Ab: NEGATIVE

## 2015-07-11 LAB — LIPID PANEL
CHOL/HDL RATIO: 3
Cholesterol: 193 mg/dL (ref 0–200)
HDL: 72.7 mg/dL (ref >39.00)
LDL CALC: 109 mg/dL — AB (ref 0–99)
NonHDL: 120.55
TRIGLYCERIDES: 58 mg/dL (ref 0.0–149.0)
VLDL: 11.6 mg/dL (ref 0.0–40.0)

## 2015-07-11 LAB — HEPATIC FUNCTION PANEL
ALT: 14 U/L (ref 0–35)
AST: 15 U/L (ref 0–37)
Albumin: 4.1 g/dL (ref 3.5–5.2)
Alkaline Phosphatase: 56 U/L (ref 39–117)
BILIRUBIN DIRECT: 0.1 mg/dL (ref 0.0–0.3)
BILIRUBIN TOTAL: 0.7 mg/dL (ref 0.2–1.2)
TOTAL PROTEIN: 7 g/dL (ref 6.0–8.3)

## 2015-07-11 LAB — TSH: TSH: 1.93 u[IU]/mL (ref 0.35–4.50)

## 2015-07-11 LAB — BASIC METABOLIC PANEL
BUN: 25 mg/dL — ABNORMAL HIGH (ref 6–23)
CHLORIDE: 106 meq/L (ref 96–112)
CO2: 29 mEq/L (ref 19–32)
Calcium: 9.5 mg/dL (ref 8.4–10.5)
Creatinine, Ser: 0.98 mg/dL (ref 0.40–1.20)
GFR: 60.36 mL/min (ref >60.00)
Glucose, Bld: 130 mg/dL — ABNORMAL HIGH (ref 70–99)
POTASSIUM: 5.1 meq/L (ref 3.5–5.1)
Sodium: 142 mEq/L (ref 135–145)

## 2015-07-11 LAB — MICROALBUMIN / CREATININE URINE RATIO
CREATININE, U: 139.9 mg/dL
MICROALB/CREAT RATIO: 0.5 mg/g (ref 0.0–30.0)
Microalb, Ur: <0.7 mg/dL (ref 0.0–1.9)

## 2015-07-11 LAB — HEMOGLOBIN A1C: HEMOGLOBIN A1C: 6.7 % — AB (ref 4.6–6.5)

## 2015-07-11 NOTE — Progress Notes (Signed)
Pre visit review using our clinic review tool, if applicable. No additional management support is needed unless otherwise documented below in the visit note. 

## 2015-07-11 NOTE — Assessment & Plan Note (Signed)
A1c , urine microalbumin, BMET 

## 2015-07-11 NOTE — Assessment & Plan Note (Signed)
CBC

## 2015-07-11 NOTE — Assessment & Plan Note (Signed)
Lipids, LFTs, TSH ,CK 

## 2015-07-11 NOTE — Patient Instructions (Signed)
  Your next office appointment will be determined based upon review of your pending labs. Those written interpretation of the lab results and instructions will be transmitted to you by My Chart  Critical results will be called.   Followup as needed for any active or acute issue. Please report any significant change in your symptoms. 

## 2015-07-11 NOTE — Progress Notes (Signed)
   Subjective:    Patient ID: Destiny Romero, female    DOB: 1951/02/06, 64 y.o.   MRN: [id number]  HPI The patient is here to assess status of active health conditions.  PMH, FH, & Social History reviewed & updated.No change in [place] as recorded.  She is on a heart healthy diet; she does not use excessive salt. She is not exercising.  Ophthalmologic exam is up-to-date. She has no retinopathy. Fasting blood sugars range 110-140. Her A1c was 6.6% in July. LDL was 150.  Review of systems is positive for some edema after prolonged sitting.  She's also had diplopia; no diagnosis has been made despite an extensive ophthalmologic evaluation.  Colonoscopy is up-to-date. She has no active GI symptoms.   Review of Systems  Chest pain, palpitations, tachycardia, exertional dyspnea, paroxysmal nocturnal dyspnea, or claudication  are absent. No unexplained weight loss, abdominal pain, significant dyspepsia, dysphagia, melena, rectal bleeding, or persistently small caliber stools. Dysuria, pyuria, hematuria, frequency, nocturia or polyuria are denied. Change in hair, skin, nails denied. No bowel changes of constipation or diarrhea. No intolerance to heat or cold.  She denies numbness, tingling,or burning in the extremities. She has no nonhealing skin lesions. She denies polyphagia, or polydipsia.     Objective:   Physical Exam  Pertinent or positive findings include: With flexion there is some "popping" of the knees. General appearance :adequately nourished; in no distress.  Eyes: No conjunctival inflammation or scleral icterus is present.  Oral exam:  Lips and gums are healthy appearing.There is no oropharyngeal erythema or exudate noted. Dental hygiene is good.  Heart:  Normal rate and regular rhythm. S1 and S2 normal without gallop, murmur, click, rub or other extra sounds    Lungs:Chest clear to auscultation; no wheezes, rhonchi,rales ,or rubs present.No increased work of breathing.    Abdomen: bowel sounds normal, soft and non-tender without masses, organomegaly or hernias noted.  No guarding or rebound.   Vascular : all pulses equal ; no bruits present.  Skin:Warm & dry.  Intact without suspicious lesions or rashes ; no tenting or jaundice   Lymphatic: No lymphadenopathy is noted about the head, neck, axilla.   Neuro: Strength, tone & DTRs normal.     Assessment & Plan:  See Current Assessment & Plan in Problem List under specific Diagnosis

## 2015-07-12 ENCOUNTER — Other Ambulatory Visit: Payer: Self-pay | Admitting: Internal Medicine

## 2015-07-12 DIAGNOSIS — D649 Anemia, unspecified: Secondary | ICD-10-CM

## 2015-09-13 ENCOUNTER — Encounter: Payer: Self-pay | Admitting: Internal Medicine

## 2015-09-19 ENCOUNTER — Encounter: Payer: Self-pay | Admitting: Internal Medicine

## 2015-09-19 DIAGNOSIS — E119 Type 2 diabetes mellitus without complications: Secondary | ICD-10-CM

## 2015-09-20 NOTE — Telephone Encounter (Signed)
Please advise if you are okay for putting this referral in?

## 2015-09-22 NOTE — Telephone Encounter (Signed)
Referral ordered - I assume it is for diabetes

## 2015-10-07 ENCOUNTER — Encounter (INDEPENDENT_AMBULATORY_CARE_PROVIDER_SITE_OTHER): Payer: Medicare HMO | Admitting: Ophthalmology

## 2015-10-07 DIAGNOSIS — H43813 Vitreous degeneration, bilateral: Secondary | ICD-10-CM | POA: Diagnosis not present

## 2015-10-07 DIAGNOSIS — H2513 Age-related nuclear cataract, bilateral: Secondary | ICD-10-CM | POA: Diagnosis not present

## 2015-10-07 DIAGNOSIS — H35373 Puckering of macula, bilateral: Secondary | ICD-10-CM

## 2015-10-07 DIAGNOSIS — H353211 Exudative age-related macular degeneration, right eye, with active choroidal neovascularization: Secondary | ICD-10-CM

## 2015-10-16 ENCOUNTER — Encounter (INDEPENDENT_AMBULATORY_CARE_PROVIDER_SITE_OTHER): Payer: Medicare HMO | Admitting: Ophthalmology

## 2015-10-16 DIAGNOSIS — H318 Other specified disorders of choroid: Secondary | ICD-10-CM

## 2015-11-13 ENCOUNTER — Ambulatory Visit (INDEPENDENT_AMBULATORY_CARE_PROVIDER_SITE_OTHER): Payer: Medicare HMO | Admitting: Ophthalmology

## 2015-11-13 DIAGNOSIS — H318 Other specified disorders of choroid: Secondary | ICD-10-CM

## 2016-02-13 ENCOUNTER — Encounter (INDEPENDENT_AMBULATORY_CARE_PROVIDER_SITE_OTHER): Payer: Medicare HMO | Admitting: Ophthalmology

## 2016-02-13 DIAGNOSIS — H2513 Age-related nuclear cataract, bilateral: Secondary | ICD-10-CM

## 2016-02-13 DIAGNOSIS — H353211 Exudative age-related macular degeneration, right eye, with active choroidal neovascularization: Secondary | ICD-10-CM | POA: Diagnosis not present

## 2016-02-13 DIAGNOSIS — H43813 Vitreous degeneration, bilateral: Secondary | ICD-10-CM

## 2016-02-13 DIAGNOSIS — H35373 Puckering of macula, bilateral: Secondary | ICD-10-CM

## 2016-02-17 ENCOUNTER — Encounter: Payer: Self-pay | Admitting: Gastroenterology

## 2016-03-03 ENCOUNTER — Telehealth: Payer: Self-pay

## 2016-03-03 NOTE — Telephone Encounter (Signed)
Patient is on the list for [employer] 2017 and may be a good candidate for an AWV in 2017. Please let me know if/when appt is scheduled.   

## 2016-03-06 DIAGNOSIS — H5203 Hypermetropia, bilateral: Secondary | ICD-10-CM | POA: Diagnosis not present

## 2016-03-06 NOTE — Telephone Encounter (Signed)
Patient has a scheduled New Patient appointment 03/24/16. LVM advising patent of message below.

## 2016-03-13 ENCOUNTER — Encounter: Payer: Self-pay | Admitting: Family Medicine

## 2016-03-24 ENCOUNTER — Ambulatory Visit: Payer: Medicare HMO | Admitting: Family Medicine

## 2016-04-03 ENCOUNTER — Ambulatory Visit: Payer: Medicare HMO | Admitting: Family Medicine

## 2016-05-14 ENCOUNTER — Ambulatory Visit: Payer: Medicare HMO | Admitting: Family Medicine

## 2016-06-08 ENCOUNTER — Encounter: Payer: Self-pay | Admitting: Family Medicine

## 2016-06-08 ENCOUNTER — Ambulatory Visit (INDEPENDENT_AMBULATORY_CARE_PROVIDER_SITE_OTHER): Payer: Medicare HMO | Admitting: Family Medicine

## 2016-06-08 DIAGNOSIS — M8580 Other specified disorders of bone density and structure, unspecified site: Secondary | ICD-10-CM

## 2016-06-08 DIAGNOSIS — D649 Anemia, unspecified: Secondary | ICD-10-CM | POA: Insufficient documentation

## 2016-06-08 DIAGNOSIS — E119 Type 2 diabetes mellitus without complications: Secondary | ICD-10-CM | POA: Diagnosis not present

## 2016-06-08 DIAGNOSIS — Z8619 Personal history of other infectious and parasitic diseases: Secondary | ICD-10-CM

## 2016-06-08 DIAGNOSIS — C20 Malignant neoplasm of rectum: Secondary | ICD-10-CM

## 2016-06-08 DIAGNOSIS — H547 Unspecified visual loss: Secondary | ICD-10-CM

## 2016-06-08 DIAGNOSIS — K5900 Constipation, unspecified: Secondary | ICD-10-CM | POA: Insufficient documentation

## 2016-06-08 DIAGNOSIS — E785 Hyperlipidemia, unspecified: Secondary | ICD-10-CM | POA: Diagnosis not present

## 2016-06-08 DIAGNOSIS — H3410 Central retinal artery occlusion, unspecified eye: Secondary | ICD-10-CM

## 2016-06-08 DIAGNOSIS — R69 Illness, unspecified: Secondary | ICD-10-CM | POA: Diagnosis not present

## 2016-06-08 HISTORY — DX: Constipation, unspecified: K59.00

## 2016-06-08 HISTORY — DX: Unspecified visual loss: H54.7

## 2016-06-08 HISTORY — DX: Central retinal artery occlusion, unspecified eye: H34.10

## 2016-06-08 MED ORDER — ACCU-CHEK AVIVA PLUS VI STRP: ORAL_STRIP | 1 refills | Status: DC

## 2016-06-08 NOTE — Progress Notes (Signed)
Patient ID: Destiny Romero, female   DOB: August 07, 1951, 65 y.o.   MRN: [id number]    Subjective:    Patient ID: Destiny Romero, female    DOB: December 19, 1950, 65 y.o.   MRN: [id number]  Chief Complaint  Patient presents with  . New Patient (Initial Visit)    estblish care    HPI Patient is in today for new patient appointment. Her previous PMD has retired. She has a PMH that includes diabetes, anemia, hyperlipidemai, osteopenia, skin cancer and rectal cancer. She feels well at the present time although she has noted some mild constipation, having to strain to move small, firm BMs most days. No bloody or tarry stool. Denies polyuria or polydipsia. Denies CP/palp/SOB/HA/congestion/fevers or GU c/o. Taking meds as prescribed  Past Medical History:  Diagnosis Date  . Allergy   . Anemia   . Central retinal artery occlusion 06/08/2016  . Cervical dysplasia 04/1991   Dr Rica Koyanagi - CIN I, cone and freeze in 1990s with normal paps since  . Constipation 06/08/2016   moves bowels every 3 rd day   . Decreased visual acuity 06/08/2016   Retinal bleed right eye 2017  . Depression   . H/O measles   . H/O mumps   . History of chicken pox   . Mitral valve prolapse    NO MR; no SBE prophylaxis needed  . Pre-diabetes    highest A1c 6.4 %  . Squamous cell carcinoma in situ of skin    Dr [place]    Past Surgical History:  Procedure Laterality Date  . CERVICAL CONE BIOPSY    . COLONOSCOPY  2003  . COLONOSCOPY  04/01/2011   [place] - repeat in 5 years  . COLONOSCOPY W/ POLYPECTOMY  2007   Dr Earlean Shawl  . LAMINECTOMY     1982  . OOPHORECTOMY Left 12/2011   Dr Ubaldo Glassing ; ovarian cyst  . RECTAL SURGERY  05/28/06   pre-canerous lesion removed in 2007; Dr Morton Stall, [facility]  . rt foot morton's surgery    . TUBAL LIGATION      Family History  Problem Relation Age of Onset  . Diabetes Mother   . Dementia Mother   . Prostate cancer Father   . COPD Father   . Heart disease Father     CAD - used nitroglycerin  tablets  . Diabetes Sister     obese  . Diabetes Brother     not obese  . Lymphoma Brother     NHL  . Cancer Brother     non Hodgkin's Lymphoma  . Diabetes Maternal Grandmother     IDDM  . Thyroid cancer Sister   . Diabetes Paternal Grandmother     IDDM  . Heart disease Paternal Grandmother   . Diabetes Maternal Aunt     X5 ; all IDDM  . Heart disease Maternal Aunt     "enlarged heart"  . Heart disease Maternal Uncle     "enlarged heart"  . Heart disease Paternal Grandfather     CAD - used nitroglycerin tablets  . Diabetes Brother   . Colon cancer Neg Hx   . Esophageal cancer Neg Hx   . Stomach cancer Neg Hx   . Stroke Neg Hx     Social History   Social History  . Marital status: Single    Spouse name: N/A  . Number of children: N/A  . Years of education: N/A   Occupational History  . Not on file.  Social History Main Topics  . Smoking status: Never Smoker  . Smokeless tobacco: Never Used  . Alcohol use Yes     Comment: rarely  . Drug use: No  . Sexual activity: No   Other Topics Concern  . Not on file   Social History Narrative   Lives alone   No dietary restrictions    Outpatient Medications Prior to Visit  Medication Sig Dispense Refill  . ACCU-CHEK SOFTCLIX LANCETS lancets     . blood glucose meter kit and supplies KIT Dispense based on patient and insurance preference. Use up to four times daily as directed. DX: E11.09 1 each 0  . chlorpheniramine (CHLOR-TRIMETON) 2 MG/5ML syrup Take 4 mg by mouth.    Marland Kitchen ACCU-CHEK AVIVA PLUS test strip     . diphenhydrAMINE (BENADRYL) 25 MG tablet Take 25 mg by mouth every 6 (six) hours as needed for itching.    . lamoTRIgine (LAMICTAL) 150 MG tablet      No facility-administered medications prior to visit.     Allergies  Allergen Reactions  . Cortisone Palpitations and Other (See Comments)    hyperactivity  . Pseudoephedrine     tachycardia  . Metformin And Related     04/15/15 caused dizziness     Review of Systems  Constitutional: Negative for chills, fever and malaise/fatigue.  HENT: Negative for congestion and hearing loss.   Eyes: Negative for discharge.  Respiratory: Negative for cough, sputum production and shortness of breath.   Cardiovascular: Negative for chest pain, palpitations and leg swelling.  Gastrointestinal: Negative for abdominal pain, blood in stool, constipation, diarrhea, heartburn, nausea and vomiting.  Genitourinary: Negative for dysuria, frequency, hematuria and urgency.  Musculoskeletal: Negative for back pain, falls and myalgias.  Skin: Negative for rash.  Neurological: Negative for dizziness, sensory change, loss of consciousness, weakness and headaches.  Endo/Heme/Allergies: Negative for environmental allergies. Does not bruise/bleed easily.  Psychiatric/Behavioral: Negative for depression and suicidal ideas. The patient is not nervous/anxious and does not have insomnia.        Objective:    Physical Exam  Constitutional: She is oriented to person, place, and time. She appears well-developed and well-nourished. No distress.  HENT:  Head: Normocephalic and atraumatic.  Eyes: Conjunctivae are normal.  Neck: Neck supple. No thyromegaly present.  Cardiovascular: Normal rate, regular rhythm and normal heart sounds.   No murmur heard. Pulmonary/Chest: Effort normal and breath sounds normal. No respiratory distress.  Abdominal: Soft. Bowel sounds are normal. She exhibits no distension and no mass. There is no tenderness.  Musculoskeletal: She exhibits no edema.  Lymphadenopathy:    She has no cervical adenopathy.  Neurological: She is alert and oriented to person, place, and time.  Skin: Skin is warm and dry.  Psychiatric: She has a normal mood and affect. Her behavior is normal.    BP 120/80 (BP Location: Right Arm, Patient Position: Sitting, Cuff Size: Normal)   Pulse 66   Temp 97.8 F (36.6 C) (Oral)   Ht '5\' 4"'$  (1.626 m)   Wt 163 lb 4 oz  (74 kg)   SpO2 98%   BMI 28.02 kg/m  Wt Readings from Last 3 Encounters:  06/08/16 163 lb 4 oz (74 kg)  07/11/15 156 lb (70.8 kg)  04/15/15 157 lb (71.2 kg)     Lab Results  Component Value Date   WBC 9.2 07/11/2015   HGB 11.7 (L) 07/11/2015   HCT 35.9 (L) 07/11/2015   PLT 299.0 07/11/2015  GLUCOSE 130 (H) 07/11/2015   CHOL 193 07/11/2015   TRIG 58.0 07/11/2015   HDL 72.70 07/11/2015   LDLDIRECT 146.5 08/11/2013   LDLCALC 109 (H) 07/11/2015   ALT 14 07/11/2015   AST 15 07/11/2015   NA 142 07/11/2015   K 5.1 07/11/2015   CL 106 07/11/2015   CREATININE 0.98 07/11/2015   BUN 25 (H) 07/11/2015   CO2 29 07/11/2015   TSH 1.93 07/11/2015   HGBA1C 6.7 (H) 07/11/2015   MICROALBUR <0.7 07/11/2015    Lab Results  Component Value Date   TSH 1.93 07/11/2015   Lab Results  Component Value Date   WBC 9.2 07/11/2015   HGB 11.7 (L) 07/11/2015   HCT 35.9 (L) 07/11/2015   MCV 84.2 07/11/2015   PLT 299.0 07/11/2015   Lab Results  Component Value Date   NA 142 07/11/2015   K 5.1 07/11/2015   CO2 29 07/11/2015   GLUCOSE 130 (H) 07/11/2015   BUN 25 (H) 07/11/2015   CREATININE 0.98 07/11/2015   BILITOT 0.7 07/11/2015   ALKPHOS 56 07/11/2015   AST 15 07/11/2015   ALT 14 07/11/2015   PROT 7.0 07/11/2015   ALBUMIN 4.1 07/11/2015   CALCIUM 9.5 07/11/2015   GFR 60.36 07/11/2015   Lab Results  Component Value Date   CHOL 193 07/11/2015   Lab Results  Component Value Date   HDL 72.70 07/11/2015   Lab Results  Component Value Date   LDLCALC 109 (H) 07/11/2015   Lab Results  Component Value Date   TRIG 58.0 07/11/2015   Lab Results  Component Value Date   CHOLHDL 3 07/11/2015   Lab Results  Component Value Date   HGBA1C 6.7 (H) 07/11/2015       Assessment & Plan:   Problem List Items Addressed This Visit    NEOPLASM, MALIGNANT, RECTUM    Would like to see if she can switch gastroenterologist      Hyperlipidemia    Tolerating statin, encouraged heart  healthy diet, avoid trans fats, minimize simple carbs and saturated fats. Increase exercise as tolerated      Relevant Orders   Lipid panel   Osteopenia    Encouraged to get adequate exercise, calcium and vitamin d intake      Diabetes type 2, controlled (HCC)    hgba1c acceptable, minimize simple carbs. Increase exercise as tolerated. Continue current meds      Relevant Orders   TSH   Hemoglobin A1c   Comprehensive metabolic panel   Constipation    Encouraged increased hydration and fiber in diet. Daily probiotics. If bowels not moving can use MOM 2 tbls po in 4 oz of warm prune juice by mouth every 2-3 days. If no results then repeat in 4 hours with  Dulcolax suppository pr, may repeat again in 4 more hours as needed. Seek care if symptoms worsen. Consider daily Miralax and/or Dulcolax if symptoms persist.       Decreased visual acuity   History of chicken pox   Anemia    Increase leafy greens, consider increased lean red meat and using cast iron cookware. Continue to monitor, report any concerns      Relevant Orders   CBC    Other Visit Diagnoses   None.     I have discontinued Ms. Bossler's diphenhydrAMINE. I have also changed her [place]. Additionally, I am having her maintain her chlorpheniramine, blood glucose meter kit and supplies, ACCU-CHEK SOFTCLIX LANCETS, and lamoTRIgine.  Meds ordered this encounter  Medications  . lamoTRIgine (LAMICTAL) 200 MG tablet    Sig: Take 200 mg by mouth daily.  Marland Kitchen ACCU-CHEK AVIVA PLUS test strip    Sig: Use once daily to check blood sugar.  DX E11.9    Dispense:  100 each    Refill:  1     Penni Homans, MD

## 2016-06-08 NOTE — Assessment & Plan Note (Addendum)
Would like to see if she can switch gastroenterologist

## 2016-06-08 NOTE — Progress Notes (Signed)
Pre visit review using our clinic review tool, if applicable. No additional management support is needed unless otherwise documented below in the visit note. 

## 2016-06-08 NOTE — Assessment & Plan Note (Signed)
Tolerating statin, encouraged heart healthy diet, avoid trans fats, minimize simple carbs and saturated fats. Increase exercise as tolerated 

## 2016-06-08 NOTE — Assessment & Plan Note (Signed)
Increase leafy greens, consider increased lean red meat and using cast iron cookware. Continue to monitor, report any concerns 

## 2016-06-08 NOTE — Assessment & Plan Note (Signed)
Encouraged increased hydration and fiber in diet. Daily probiotics. If bowels not moving can use MOM 2 tbls po in 4 oz of warm prune juice by mouth every 2-3 days. If no results then repeat in 4 hours with  Dulcolax suppository pr, may repeat again in 4 more hours as needed. Seek care if symptoms worsen. Consider daily Miralax and/or Dulcolax if symptoms persist.  

## 2016-06-08 NOTE — Assessment & Plan Note (Signed)
Encouraged to get adequate exercise, calcium and vitamin d intake 

## 2016-06-08 NOTE — Patient Instructions (Signed)
DASH Eating Plan  DASH stands for "Dietary Approaches to Stop Hypertension." The DASH eating plan is a healthy eating plan that has been shown to reduce high blood pressure (hypertension). Additional health benefits may include reducing the risk of type 2 diabetes mellitus, heart disease, and stroke. The DASH eating plan may also help with weight loss.  WHAT DO I NEED TO KNOW ABOUT THE DASH EATING PLAN?  For the DASH eating plan, you will follow these general guidelines:  · Choose foods with a percent daily value for sodium of less than 5% (as listed on the food label).  · Use salt-free seasonings or herbs instead of table salt or sea salt.  · Check with your health care provider or pharmacist before using salt substitutes.  · Eat lower-sodium products, often labeled as "lower sodium" or "no salt added."  · Eat fresh foods.  · Eat more vegetables, fruits, and low-fat dairy products.  · Choose whole grains. Look for the word "whole" as the first word in the ingredient list.  · Choose fish and skinless chicken or turkey more often than red meat. Limit fish, poultry, and meat to 6 oz (170 g) each day.  · Limit sweets, desserts, sugars, and sugary drinks.  · Choose heart-healthy fats.  · Limit cheese to 1 oz (28 g) per day.  · Eat more home-cooked food and less restaurant, buffet, and fast food.  · Limit fried foods.  · Cook foods using methods other than frying.  · Limit canned vegetables. If you do use them, rinse them well to decrease the sodium.  · When eating at a restaurant, ask that your food be prepared with less salt, or no salt if possible.  WHAT FOODS CAN I EAT?  Seek help from a dietitian for individual calorie needs.  Grains  Whole grain or whole wheat bread. Brown rice. Whole grain or whole wheat pasta. Quinoa, bulgur, and whole grain cereals. Low-sodium cereals. Corn or whole wheat flour tortillas. Whole grain cornbread. Whole grain crackers. Low-sodium crackers.  Vegetables  Fresh or frozen vegetables  (raw, steamed, roasted, or grilled). Low-sodium or reduced-sodium tomato and vegetable juices. Low-sodium or reduced-sodium tomato sauce and paste. Low-sodium or reduced-sodium canned vegetables.   Fruits  All fresh, canned (in natural juice), or frozen fruits.  Meat and Other Protein Products  Ground beef (85% or leaner), grass-fed beef, or beef trimmed of fat. Skinless chicken or turkey. Ground chicken or turkey. Pork trimmed of fat. All fish and seafood. Eggs. Dried beans, peas, or lentils. Unsalted nuts and seeds. Unsalted canned beans.  Dairy  Low-fat dairy products, such as skim or 1% milk, 2% or reduced-fat cheeses, low-fat ricotta or cottage cheese, or plain low-fat yogurt. Low-sodium or reduced-sodium cheeses.  Fats and Oils  Tub margarines without trans fats. Light or reduced-fat mayonnaise and salad dressings (reduced sodium). Avocado. Safflower, olive, or canola oils. Natural peanut or almond butter.  Other  Unsalted popcorn and pretzels.  The items listed above may not be a complete list of recommended foods or beverages. Contact your dietitian for more options.  WHAT FOODS ARE NOT RECOMMENDED?  Grains  White bread. White pasta. White rice. Refined cornbread. Bagels and croissants. Crackers that contain trans fat.  Vegetables  Creamed or fried vegetables. Vegetables in a cheese sauce. Regular canned vegetables. Regular canned tomato sauce and paste. Regular tomato and vegetable juices.  Fruits  Dried fruits. Canned fruit in light or heavy syrup. Fruit juice.  Meat and Other Protein   Products  Fatty cuts of meat. Ribs, chicken wings, bacon, sausage, bologna, salami, chitterlings, fatback, hot dogs, bratwurst, and packaged luncheon meats. Salted nuts and seeds. Canned beans with salt.  Dairy  Whole or 2% milk, cream, half-and-half, and cream cheese. Whole-fat or sweetened yogurt. Full-fat cheeses or blue cheese. Nondairy creamers and whipped toppings. Processed cheese, cheese spreads, or cheese  curds.  Condiments  Onion and garlic salt, seasoned salt, table salt, and sea salt. Canned and packaged gravies. Worcestershire sauce. Tartar sauce. Barbecue sauce. Teriyaki sauce. Soy sauce, including reduced sodium. Steak sauce. Fish sauce. Oyster sauce. Cocktail sauce. Horseradish. Ketchup and mustard. Meat flavorings and tenderizers. Bouillon cubes. Hot sauce. Tabasco sauce. Marinades. Taco seasonings. Relishes.  Fats and Oils  Butter, stick margarine, lard, shortening, ghee, and bacon fat. Coconut, palm kernel, or palm oils. Regular salad dressings.  Other  Pickles and olives. Salted popcorn and pretzels.  The items listed above may not be a complete list of foods and beverages to avoid. Contact your dietitian for more information.  WHERE CAN I FIND MORE INFORMATION?  National Heart, Lung, and [employer] Institute: www.nhlbi.nih.gov/health/health-topics/topics/dash/     This information is not intended to replace advice given to you by your health care provider. Make sure you discuss any questions you have with your health care provider.     Document Released: 08/13/2011 Document Revised: 09/14/2014 Document Reviewed: 06/28/2013  Elsevier Interactive Patient Education ©2016 Elsevier Inc.

## 2016-06-08 NOTE — Assessment & Plan Note (Signed)
hgba1c acceptable, minimize simple carbs. Increase exercise as tolerated. Continue current meds 

## 2016-06-09 ENCOUNTER — Other Ambulatory Visit: Payer: Self-pay | Admitting: Family Medicine

## 2016-06-09 DIAGNOSIS — D649 Anemia, unspecified: Secondary | ICD-10-CM

## 2016-06-09 DIAGNOSIS — K625 Hemorrhage of anus and rectum: Secondary | ICD-10-CM

## 2016-06-09 LAB — COMPREHENSIVE METABOLIC PANEL
ALT: 14 U/L (ref 0–35)
AST: 18 U/L (ref 0–37)
Albumin: 4.1 g/dL (ref 3.5–5.2)
Alkaline Phosphatase: 53 U/L (ref 39–117)
BILIRUBIN TOTAL: 0.4 mg/dL (ref 0.2–1.2)
BUN: 13 mg/dL (ref 6–23)
CALCIUM: 9 mg/dL (ref 8.4–10.5)
CHLORIDE: 105 meq/L (ref 96–112)
CO2: 30 meq/L (ref 19–32)
Creatinine, Ser: 1.01 mg/dL (ref 0.40–1.20)
GFR: 58.14 mL/min — AB (ref >60.00)
Glucose, Bld: 128 mg/dL — ABNORMAL HIGH (ref 70–99)
Potassium: 4.4 mEq/L (ref 3.5–5.1)
Sodium: 142 mEq/L (ref 135–145)
Total Protein: 7.3 g/dL (ref 6.0–8.3)

## 2016-06-09 LAB — CBC
HCT: 26.5 % — ABNORMAL LOW (ref 36.0–46.0)
MCHC: 31.7 g/dL (ref 30.0–36.0)
MCV: 66.4 fl — ABNORMAL LOW (ref 78.0–100.0)
PLATELETS: 402 10*3/uL — AB (ref 150.0–400.0)
RBC: 3.99 Mil/uL (ref 3.87–5.11)
RDW: 18.7 % — ABNORMAL HIGH (ref 11.5–15.5)
WBC: 7.7 10*3/uL (ref 4.0–10.5)

## 2016-06-09 LAB — LIPID PANEL
CHOL/HDL RATIO: 3
Cholesterol: 232 mg/dL — ABNORMAL HIGH (ref 0–200)
HDL: 69.8 mg/dL (ref >39.00)
LDL CALC: 145 mg/dL — AB (ref 0–99)
NonHDL: 162.25
TRIGLYCERIDES: 84 mg/dL (ref 0.0–149.0)
VLDL: 16.8 mg/dL (ref 0.0–40.0)

## 2016-06-09 LAB — TSH: TSH: 1.36 u[IU]/mL (ref 0.35–4.50)

## 2016-06-09 LAB — HEMOGLOBIN A1C: Hgb A1c MFr Bld: 7 % — ABNORMAL HIGH (ref 4.6–6.5)

## 2016-06-09 MED ORDER — FERROUS FUMARATE 324 (106 FE) MG PO TABS: 1.0000 | ORAL_TABLET | Freq: Every day | ORAL | 2 refills | Status: DC

## 2016-06-11 ENCOUNTER — Other Ambulatory Visit: Payer: Self-pay | Admitting: Family Medicine

## 2016-06-11 ENCOUNTER — Other Ambulatory Visit (INDEPENDENT_AMBULATORY_CARE_PROVIDER_SITE_OTHER): Payer: Medicare HMO

## 2016-06-11 DIAGNOSIS — L821 Other seborrheic keratosis: Secondary | ICD-10-CM | POA: Diagnosis not present

## 2016-06-11 DIAGNOSIS — D2272 Melanocytic nevi of left lower limb, including hip: Secondary | ICD-10-CM | POA: Diagnosis not present

## 2016-06-11 DIAGNOSIS — D1801 Hemangioma of skin and subcutaneous tissue: Secondary | ICD-10-CM | POA: Diagnosis not present

## 2016-06-11 DIAGNOSIS — D2262 Melanocytic nevi of left upper limb, including shoulder: Secondary | ICD-10-CM | POA: Diagnosis not present

## 2016-06-11 DIAGNOSIS — D2261 Melanocytic nevi of right upper limb, including shoulder: Secondary | ICD-10-CM | POA: Diagnosis not present

## 2016-06-11 DIAGNOSIS — D649 Anemia, unspecified: Secondary | ICD-10-CM | POA: Diagnosis not present

## 2016-06-11 DIAGNOSIS — L738 Other specified follicular disorders: Secondary | ICD-10-CM | POA: Diagnosis not present

## 2016-06-11 DIAGNOSIS — Z85828 Personal history of other malignant neoplasm of skin: Secondary | ICD-10-CM | POA: Diagnosis not present

## 2016-06-11 DIAGNOSIS — L570 Actinic keratosis: Secondary | ICD-10-CM | POA: Diagnosis not present

## 2016-06-11 DIAGNOSIS — D225 Melanocytic nevi of trunk: Secondary | ICD-10-CM | POA: Diagnosis not present

## 2016-06-11 DIAGNOSIS — D2271 Melanocytic nevi of right lower limb, including hip: Secondary | ICD-10-CM | POA: Diagnosis not present

## 2016-06-11 LAB — CBC WITH DIFFERENTIAL/PLATELET
BASOS ABS: 0 10*3/uL (ref 0.0–0.1)
BASOS PCT: 0.5 % (ref 0.0–3.0)
EOS ABS: 0.7 10*3/uL (ref 0.0–0.7)
Eosinophils Relative: 8.9 % — ABNORMAL HIGH (ref 0.0–5.0)
Hemoglobin: 8.3 g/dL — ABNORMAL LOW (ref 12.0–15.0)
LYMPHS PCT: 43.9 % (ref 12.0–46.0)
Lymphs Abs: 3.6 10*3/uL (ref 0.7–4.0)
MCHC: 31.1 g/dL (ref 30.0–36.0)
MCV: 66.7 fl — ABNORMAL LOW (ref 78.0–100.0)
MONO ABS: 0.7 10*3/uL (ref 0.1–1.0)
Monocytes Relative: 8.1 % (ref 3.0–12.0)
NEUTROS ABS: 3.2 10*3/uL (ref 1.4–7.7)
NEUTROS PCT: 38.6 % — AB (ref 43.0–77.0)
PLATELETS: 389 10*3/uL (ref 150.0–400.0)
RBC: 3.99 Mil/uL (ref 3.87–5.11)
RDW: 18.6 % — AB (ref 11.5–15.5)
WBC: 8.3 10*3/uL (ref 4.0–10.5)

## 2016-06-12 ENCOUNTER — Encounter: Payer: Self-pay | Admitting: Gastroenterology

## 2016-06-15 ENCOUNTER — Encounter: Payer: Self-pay | Admitting: Family Medicine

## 2016-06-16 ENCOUNTER — Other Ambulatory Visit (INDEPENDENT_AMBULATORY_CARE_PROVIDER_SITE_OTHER): Payer: Medicare HMO

## 2016-06-16 DIAGNOSIS — K625 Hemorrhage of anus and rectum: Secondary | ICD-10-CM

## 2016-06-16 LAB — FECAL OCCULT BLOOD, IMMUNOCHEMICAL: Fecal Occult Bld: NEGATIVE

## 2016-06-19 ENCOUNTER — Encounter: Payer: Self-pay | Admitting: Podiatry

## 2016-06-19 ENCOUNTER — Ambulatory Visit (INDEPENDENT_AMBULATORY_CARE_PROVIDER_SITE_OTHER): Payer: Medicare HMO | Admitting: Podiatry

## 2016-06-19 ENCOUNTER — Ambulatory Visit (INDEPENDENT_AMBULATORY_CARE_PROVIDER_SITE_OTHER): Payer: Medicare HMO

## 2016-06-19 VITALS — BP 116/64 | HR 70 | Resp 14

## 2016-06-19 DIAGNOSIS — R52 Pain, unspecified: Secondary | ICD-10-CM

## 2016-06-19 DIAGNOSIS — S93609A Unspecified sprain of unspecified foot, initial encounter: Secondary | ICD-10-CM

## 2016-06-19 MED ORDER — MELOXICAM 15 MG PO TABS: 15.0000 mg | ORAL_TABLET | Freq: Every day | ORAL | 0 refills | Status: DC

## 2016-06-19 NOTE — Progress Notes (Signed)
   Subjective:    Patient ID: Destiny Romero, female    DOB: 1951-02-28, 65 y.o.   MRN: [id number]  HPI this patient presents to the office saying that the outside bottom of both feet are painful and sore, especially upon rising in the morning. It is worse than her left foot. This is been going on for months. She says she has tried different inserts, but they have not helped to resolve her pain. He presents the office today stating she's had no trauma or injury to the foot    Review of Systems  All other systems reviewed and are negative.      Objective:   Physical Exam GENERAL APPEARANCE: Alert, conversant. Appropriately groomed. No acute distress.  VASCULAR: Pedal pulses are  palpable at  [facility] and PT bilateral.  Capillary refill time is immediate to all digits,  Normal temperature gradient.  Digital hair growth is present bilateral  NEUROLOGIC: sensation is normal to 5.07 monofilament at 5/5 sites bilateral.  Light touch is intact bilateral, Muscle strength normal.  MUSCULOSKELETAL: acceptable muscle strength, tone and stability bilateral.  Intrinsic muscluature intact bilateral. HAV  B/L. Pain and discomfort under the outer arch both feet. Prominent plantar fascial band medially both feet.  DERMATOLOGIC: skin color, texture, and turgor are within normal limits.  No preulcerative lesions or ulcers  are seen, no interdigital maceration noted.  No open lesions present.  Digital nails are asymptomatic. No drainage noted.         Assessment & Plan:  Foot Srain  Cuboid syndrome  B/L   IE  Xrays reveal no pathology.  Recommended she use power step insoles. She was also prescribed a prescription of Mobic 15 mg #30. Return to the office in 2 weeks for further evaluation and treatment   Gardiner Barefoot DPM

## 2016-07-01 ENCOUNTER — Ambulatory Visit (INDEPENDENT_AMBULATORY_CARE_PROVIDER_SITE_OTHER): Payer: Medicare HMO | Admitting: Nurse Practitioner

## 2016-07-01 ENCOUNTER — Encounter: Payer: Self-pay | Admitting: Nurse Practitioner

## 2016-07-01 VITALS — BP 120/76 | HR 60 | Ht 63.75 in | Wt 164.0 lb

## 2016-07-01 DIAGNOSIS — D490 Neoplasm of unspecified behavior of digestive system: Secondary | ICD-10-CM

## 2016-07-01 DIAGNOSIS — M8580 Other specified disorders of bone density and structure, unspecified site: Secondary | ICD-10-CM | POA: Diagnosis not present

## 2016-07-01 DIAGNOSIS — Z0000 Encounter for general adult medical examination without abnormal findings: Secondary | ICD-10-CM | POA: Diagnosis not present

## 2016-07-01 DIAGNOSIS — Z124 Encounter for screening for malignant neoplasm of cervix: Secondary | ICD-10-CM | POA: Diagnosis not present

## 2016-07-01 DIAGNOSIS — Z01419 Encounter for gynecological examination (general) (routine) without abnormal findings: Secondary | ICD-10-CM

## 2016-07-01 NOTE — Progress Notes (Signed)
Patient ID: Destiny Romero, female   DOB: July 27, 1951, 65 y.o.   MRN: [id number]  64 y.o. [id number] Single  Caucasian Fe here for annual exam.  No visit here since 08/2013.  Recent diagnosis of diabetes and anemia this past year.  Her most recent HGB was 8.3 and is now on Hemocyte.  She is trying diet control for DM.  She has been really stressed at her job and has now quit as of mid September.  She is trying to work on getting better herself.  Her ex husband who created a lot of stress during their divorce has passed away a year ago.  Patient's last menstrual period was 04/07/2001 (approximate).          Sexually active: No.  The current method of family planning is abstinence.    Exercising: Yes.    walking Smoker:  no  Health Maintenance: Pap: 08/17/13, Negative with neg HR HPV MMG:  08/04/13, Bi-Rads 1: negative Colonoscopy:  03/31/11, normal, repeat in 5 years due to previous ano-rectal neoplasm BMD: 05/05/12, T Score -0.1 Spine / -1.9 Right Femur Neck / -1.8 Left Femur Neck TDaP:  02/03/13 Shingles: 04/22/10 Pneumonia: Never Hep C: 07/11/15 Labs: PCP takes care of screening labs (In EPIC)   reports that she has never smoked. She has never used smokeless tobacco. She reports that she drinks alcohol. She reports that she does not use drugs.  Past Medical History:  Diagnosis Date  . Allergy   . Anemia   . Central retinal artery occlusion 06/08/2016  . Cervical dysplasia 04/1991   Dr Rica Koyanagi - CIN I, cone and freeze in 1990s with normal paps since  . Constipation 06/08/2016   moves bowels every 3 rd day   . Decreased visual acuity 06/08/2016   Retinal bleed right eye 2017  . Depression   . H/O measles   . H/O mumps   . History of chicken pox   . Mitral valve prolapse    NO MR; no SBE prophylaxis needed  . Pre-diabetes    highest A1c 6.4 %  . Squamous cell carcinoma in situ of skin    Dr [place]    Past Surgical History:  Procedure Laterality Date  . CERVICAL CONE BIOPSY    .  COLONOSCOPY  2003  . COLONOSCOPY  04/01/2011   [place] - repeat in 5 years  . COLONOSCOPY W/ POLYPECTOMY  2007   Dr Earlean Shawl  . LAMINECTOMY     1982  . OOPHORECTOMY Left 12/2011   Dr Ubaldo Glassing ; ovarian cyst  . RECTAL SURGERY  05/28/06   pre-canerous lesion removed in 2007; Dr Morton Stall, [facility]  . rt foot morton's surgery    . TUBAL LIGATION      Current Outpatient Prescriptions  Medication Sig Dispense Refill  . ACCU-CHEK AVIVA PLUS test strip Use once daily to check blood sugar.  DX E11.9 100 each 1  . ACCU-CHEK SOFTCLIX LANCETS lancets     . blood glucose meter kit and supplies KIT Dispense based on patient and insurance preference. Use up to four times daily as directed. DX: E11.09 1 each 0  . chlorpheniramine (CHLOR-TRIMETON) 2 MG/5ML syrup Take 4 mg by mouth.    . Ferrous Fumarate (HEMOCYTE) 324 (106 Fe) MG TABS tablet Take 1 tablet (106 mg of iron total) by mouth daily. 30 tablet 2  . lamoTRIgine (LAMICTAL) 200 MG tablet Take 200 mg by mouth daily.    . meloxicam (MOBIC) 15 MG tablet Take 1  tablet (15 mg total) by mouth daily. 30 tablet 0   No current facility-administered medications for this visit.     Family History  Problem Relation Age of Onset  . Diabetes Mother   . Dementia Mother   . Prostate cancer Father   . COPD Father   . Heart disease Father     CAD - used nitroglycerin tablets  . Diabetes Sister     obese  . Thyroid cancer Sister   . Diabetes Brother     not obese  . Lymphoma Brother     NHL  . Cancer Brother     non Hodgkin's Lymphoma  . Diabetes Maternal Grandmother     IDDM  . Diabetes Paternal Grandmother     IDDM  . Heart disease Paternal Grandmother   . Diabetes Maternal Aunt     X5 ; all IDDM  . Heart disease Maternal Aunt     "enlarged heart"  . Heart disease Maternal Uncle     "enlarged heart"  . Heart disease Paternal Grandfather     CAD - used nitroglycerin tablets  . Diabetes Brother   . Colon cancer Neg Hx   . Esophageal cancer Neg Hx    . Stomach cancer Neg Hx   . Stroke Neg Hx     ROS:  Pertinent items are noted in HPI.  Otherwise, a comprehensive ROS was negative.  Exam:   BP 120/76 (BP Location: Right Arm, Patient Position: Sitting, Cuff Size: Normal)   Pulse 60   Ht 5' 3.75" (1.619 m)   Wt 164 lb (74.4 kg)   LMP 04/07/2001 (Approximate)   BMI 28.37 kg/m  Height: 5' 3.75" (161.9 cm) Ht Readings from Last 3 Encounters:  07/01/16 5' 3.75" (1.619 m)  06/08/16 '5\' 4"'$  (1.626 m)  07/11/15 '5\' 4"'$  (1.626 m)    General appearance: alert, cooperative and appears stated age Head: Normocephalic, without obvious abnormality, atraumatic Neck: no adenopathy, supple, symmetrical, trachea midline and thyroid normal to inspection and palpation Lungs: clear to auscultation bilaterally Breasts: normal appearance, no masses or tenderness Heart: regular rate and rhythm Abdomen: soft, non-tender; no masses,  no organomegaly Extremities: extremities normal, atraumatic, no cyanosis or edema Skin: Skin color, texture, turgor normal. No rashes or lesions Lymph nodes: Cervical, supraclavicular, and axillary nodes normal. No abnormal inguinal nodes palpated Neurologic: Grossly normal   Pelvic: External genitalia:  no lesions              Urethra:  normal appearing urethra with no masses, tenderness or lesions              Bartholin's and Skene's: normal                 Vagina: normal appearing vagina with normal color and discharge, no lesions              Cervix: anteverted              Pap taken: Yes.  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

## 2016-07-01 NOTE — Patient Instructions (Signed)

## 2016-07-02 LAB — IPS PAP SMEAR ONLY

## 2016-07-03 ENCOUNTER — Ambulatory Visit (INDEPENDENT_AMBULATORY_CARE_PROVIDER_SITE_OTHER): Payer: Medicare HMO | Admitting: Podiatry

## 2016-07-03 ENCOUNTER — Encounter: Payer: Self-pay | Admitting: Podiatry

## 2016-07-03 VITALS — BP 95/53 | HR 74 | Resp 14

## 2016-07-03 DIAGNOSIS — S93609A Unspecified sprain of unspecified foot, initial encounter: Secondary | ICD-10-CM | POA: Diagnosis not present

## 2016-07-03 NOTE — Progress Notes (Signed)
Encounter reviewed by Dr. Claudia Greenley Amundson C. Silva.  

## 2016-07-03 NOTE — Progress Notes (Signed)
   Subjective:    Patient ID: Destiny Romero, female    DOB: 22-Apr-1951, 65 y.o.   MRN: [id number]  HPI this patient presents to the office Saying that her feet are improved from her last visit 2 weeks ago. She was diagnosed with a cuboid syndrome bilateral. She presents the office stating that she's not having pain as she walks nor in the morning upon rising. She even says she went hiking in [place] last week and wore her power steps and had last pain than she expected. She presents the office today for continued evaluation and treatment of both her feet    Review of Systems  All other systems reviewed and are negative.      Objective:   Physical Exam GENERAL APPEARANCE: Alert, conversant. Appropriately groomed. No acute distress.  VASCULAR: Pedal pulses are  palpable at  [facility] and PT bilateral.  Capillary refill time is immediate to all digits,  Normal temperature gradient.  Digital hair growth is present bilateral  NEUROLOGIC: sensation is normal to 5.07 monofilament at 5/5 sites bilateral.  Light touch is intact bilateral, Muscle strength normal.  MUSCULOSKELETAL: acceptable muscle strength, tone and stability bilateral.  Intrinsic muscluature intact bilateral. HAV  B/L.  Prominent plantar fascial band medially both feet.  DERMATOLOGIC: skin color, texture, and turgor are within normal limits.  No preulcerative lesions or ulcers  are seen, no interdigital maceration noted.  No open lesions present.  Digital nails are asymptomatic. No drainage noted.         Assessment & Plan:  Foot Srain  Cuboid syndrome  B/L   IE  Xrays reveal no pathology.  Recommended she use power step insoles in the future and take her Mobic.  RTC prn   Gardiner Barefoot DPM

## 2016-07-13 ENCOUNTER — Other Ambulatory Visit (INDEPENDENT_AMBULATORY_CARE_PROVIDER_SITE_OTHER): Payer: Medicare HMO

## 2016-07-13 DIAGNOSIS — D649 Anemia, unspecified: Secondary | ICD-10-CM

## 2016-07-13 LAB — CBC WITH DIFFERENTIAL/PLATELET
BASOS ABS: 0.1 10*3/uL (ref 0.0–0.1)
BASOS PCT: 0.7 % (ref 0.0–3.0)
Eosinophils Absolute: 0.6 10*3/uL (ref 0.0–0.7)
Eosinophils Relative: 6.7 % — ABNORMAL HIGH (ref 0.0–5.0)
HEMATOCRIT: 35.1 % — AB (ref 36.0–46.0)
Hemoglobin: 11.2 g/dL — ABNORMAL LOW (ref 12.0–15.0)
LYMPHS ABS: 3.9 10*3/uL (ref 0.7–4.0)
LYMPHS PCT: 45.2 % (ref 12.0–46.0)
MCHC: 32.1 g/dL (ref 30.0–36.0)
MCV: 75.6 fl — AB (ref 78.0–100.0)
MONOS PCT: 6.4 % (ref 3.0–12.0)
Monocytes Absolute: 0.5 10*3/uL (ref 0.1–1.0)
NEUTROS ABS: 3.5 10*3/uL (ref 1.4–7.7)
NEUTROS PCT: 41 % — AB (ref 43.0–77.0)
PLATELETS: 287 10*3/uL (ref 150.0–400.0)
RBC: 4.64 Mil/uL (ref 3.87–5.11)
RDW: 29.6 % — AB (ref 11.5–15.5)
WBC: 8.5 10*3/uL (ref 4.0–10.5)

## 2016-07-13 LAB — RETICULOCYTES
ABS Retic: 42300 cells/uL (ref 20000–80000)
RBC.: 4.7 MIL/uL (ref 3.80–5.10)
Retic Ct Pct: 0.9 %

## 2016-07-20 DIAGNOSIS — Z1231 Encounter for screening mammogram for malignant neoplasm of breast: Secondary | ICD-10-CM | POA: Diagnosis not present

## 2016-07-24 ENCOUNTER — Encounter: Payer: Self-pay | Admitting: Nurse Practitioner

## 2016-08-14 ENCOUNTER — Encounter: Payer: Medicare HMO | Admitting: Gastroenterology

## 2016-08-18 ENCOUNTER — Ambulatory Visit (AMBULATORY_SURGERY_CENTER): Payer: Self-pay

## 2016-08-18 VITALS — Ht 63.5 in | Wt 160.0 lb

## 2016-08-18 DIAGNOSIS — Z86010 Personal history of colonic polyps: Secondary | ICD-10-CM

## 2016-08-18 MED ORDER — NA SULFATE-K SULFATE-MG SULF 17.5-3.13-1.6 GM/177ML PO SOLN: ORAL | 0 refills | Status: DC

## 2016-08-18 NOTE — Progress Notes (Signed)
Per pt, no allergies to soy or egg products.Pt not taking any weight loss meds or using  O2 at home. 

## 2016-09-15 ENCOUNTER — Encounter: Payer: Self-pay | Admitting: Gastroenterology

## 2016-09-15 ENCOUNTER — Ambulatory Visit (AMBULATORY_SURGERY_CENTER): Payer: Medicare HMO | Admitting: Gastroenterology

## 2016-09-15 VITALS — BP 98/56 | HR 73 | Temp 99.1°F | Resp 15 | Ht 63.5 in | Wt 160.0 lb

## 2016-09-15 DIAGNOSIS — Z86010 Personal history of colonic polyps: Secondary | ICD-10-CM | POA: Diagnosis not present

## 2016-09-15 MED ORDER — SODIUM CHLORIDE 0.9 % IV SOLN: 500.0000 mL | INTRAVENOUS | Status: DC

## 2016-09-15 NOTE — Patient Instructions (Signed)
YOU HAD AN ENDOSCOPIC PROCEDURE TODAY AT THE [facility]:   Refer to the procedure report that was given to you for any specific questions about what was found during the examination.  If the procedure report does not answer your questions, please call your gastroenterologist to clarify.  If you requested that your care partner not be given the details of your procedure findings, then the procedure report has been included in a sealed envelope for you to review at your convenience later.  YOU SHOULD EXPECT: Some feelings of bloating in the abdomen. Passage of more gas than usual.  Walking can help get rid of the air that was put into your GI tract during the procedure and reduce the bloating. If you had a lower endoscopy (such as a colonoscopy or flexible sigmoidoscopy) you may notice spotting of blood in your stool or on the toilet paper. If you underwent a bowel prep for your procedure, you may not have a normal bowel movement for a few days.  Please Note:  You might notice some irritation and congestion in your nose or some drainage.  This is from the oxygen used during your procedure.  There is no need for concern and it should clear up in a day or so.  SYMPTOMS TO REPORT IMMEDIATELY:   Following lower endoscopy (colonoscopy or flexible sigmoidoscopy):  Excessive amounts of blood in the stool  Significant tenderness or worsening of abdominal pains  Swelling of the abdomen that is new, acute  Fever of 100F or higher   Following upper endoscopy (EGD)  Vomiting of blood or coffee ground material  New chest pain or pain under the shoulder blades  Painful or persistently difficult swallowing  New shortness of breath  Fever of 100F or higher  Black, tarry-looking stools  For urgent or emergent issues, a gastroenterologist can be reached at any hour by calling [phone].   DIET:  We do recommend a small meal at first, but then you may proceed to your regular diet.  Drink  plenty of fluids but you should avoid alcoholic beverages for 24 hours.  ACTIVITY:  You should plan to take it easy for the rest of today and you should NOT DRIVE or use heavy machinery until tomorrow (because of the sedation medicines used during the test).    FOLLOW UP: Our staff will call the number listed on your records the next business day following your procedure to check on you and address any questions or concerns that you may have regarding the information given to you following your procedure. If we do not reach you, we will leave a message.  However, if you are feeling well and you are not experiencing any problems, there is no need to return our call.  We will assume that you have returned to your regular daily activities without incident.  If any biopsies were taken you will be contacted by phone or by letter within the next 1-3 weeks.  Please call us at [phone] if you have not heard about the biopsies in 3 weeks.    SIGNATURES/CONFIDENTIALITY: You and/or your care partner have signed paperwork which will be entered into your electronic medical record.  These signatures attest to the fact that that the information above on your After Visit Summary has been reviewed and is understood.  Full responsibility of the confidentiality of this discharge information lies with you and/or your care-partner.  Thank-you for choosing us for your healthcare needs today. 

## 2016-09-15 NOTE — Op Note (Signed)
[place] Patient Name: Kyanah Felan Procedure Date: 09/15/2016 2:25 PM MRN: [id number] Endoscopist: Milus Banister , MD Age: 66 Referring MD:  Date of Birth: [birth year] Gender: Female Account #: [id number] Procedure:                Colonoscopy Indications:              High risk colon cancer surveillance: Personal                            history of colonic polyps; ano-rectal neoplasm                            remotely, diagnosed Dr. Earlean Shawl and treated by Dr.                            Morton Stall several times prior to establishing with Dr.                            Ardis Hughs; Dr. Morton Stall told her she did not need to see                            him anymore; colonoscopy 2012 was normal Dr. Ardis Hughs Medicines:                Monitored Anesthesia Care Procedure:                Pre-Anesthesia Assessment:                           - Prior to the procedure, a History and Physical                            was performed, and patient medications and                            allergies were reviewed. The patient's tolerance of                            previous anesthesia was also reviewed. The risks                            and benefits of the procedure and the sedation                            options and risks were discussed with the patient.                            All questions were answered, and informed consent                            was obtained. Prior Anticoagulants: The patient has                            taken no previous anticoagulant or antiplatelet  agents. ASA Grade Assessment: II - A patient with                            mild systemic disease. After reviewing the risks                            and benefits, the patient was deemed in                            satisfactory condition to undergo the procedure.                           After obtaining informed consent, the colonoscope                            was passed  under direct vision. Throughout the                            procedure, the patient's blood pressure, pulse, and                            oxygen saturations were monitored continuously. The                            Model [id number]-[id number]) scope was introduced                            through the anus and advanced to the the cecum,                            identified by appendiceal orifice and ileocecal                            valve. The colonoscopy was performed without                            difficulty. The patient tolerated the procedure                            well. The quality of the bowel preparation was                            good. The ileocecal valve, appendiceal orifice, and                            rectum were photographed. Scope In: 2:30:43 PM Scope Out: 2:40:53 PM Scope Withdrawal Time: 0 hours 5 minutes 40 seconds  Total Procedure Duration: 0 hours 10 minutes 10 seconds  Findings:                 The entire examined colon appeared normal on direct                            and retroflexion views. Complications:  No immediate complications. Estimated blood loss:                            None. Estimated Blood Loss:     Estimated blood loss: none. Impression:               - The entire examined colon is normal on direct and                            retroflexion views.                           - No specimens collected. Recommendation:           - Patient has a contact number available for                            emergencies. The signs and symptoms of potential                            delayed complications were discussed with the                            patient. Return to normal activities tomorrow.                            Written discharge instructions were provided to the                            patient.                           - Resume previous diet.                           - Continue present medications.                            - Repeat colonoscopy in 5 years for surveillance. Milus Banister, MD 09/15/2016 2:43:54 PM This report has been signed electronically.

## 2016-09-15 NOTE — Progress Notes (Signed)
Report to PACU, RN, vss, BBS= Clear.  

## 2016-09-16 ENCOUNTER — Telehealth: Payer: Self-pay

## 2016-09-16 NOTE — Telephone Encounter (Signed)
  Follow up Call-  Call back number 09/15/2016  Post procedure Call Back phone  # [phone]  Permission to leave phone message Yes  Some recent data might be hidden     Patient questions:  Do you have a fever, pain , or abdominal swelling? No. Pain Score  0 *  Have you tolerated food without any problems? Yes.    Have you been able to return to your normal activities? Yes.    Do you have any questions about your discharge instructions: Diet   No. Medications  No. Follow up visit  No.  Do you have questions or concerns about your Care? No.  Actions: * If pain score is 4 or above: No action needed, pain <4.

## 2016-09-17 DIAGNOSIS — R69 Illness, unspecified: Secondary | ICD-10-CM | POA: Diagnosis not present

## 2016-10-05 DIAGNOSIS — R69 Illness, unspecified: Secondary | ICD-10-CM | POA: Diagnosis not present

## 2016-10-08 ENCOUNTER — Other Ambulatory Visit: Payer: Self-pay | Admitting: Family Medicine

## 2016-10-30 ENCOUNTER — Ambulatory Visit (INDEPENDENT_AMBULATORY_CARE_PROVIDER_SITE_OTHER): Payer: Medicare HMO | Admitting: Internal Medicine

## 2016-10-30 ENCOUNTER — Other Ambulatory Visit: Payer: Self-pay

## 2016-10-30 ENCOUNTER — Telehealth: Payer: Self-pay | Admitting: Internal Medicine

## 2016-10-30 ENCOUNTER — Encounter: Payer: Self-pay | Admitting: Internal Medicine

## 2016-10-30 VITALS — BP 128/82 | HR 74 | Ht 64.0 in | Wt 165.0 lb

## 2016-10-30 DIAGNOSIS — R69 Illness, unspecified: Secondary | ICD-10-CM | POA: Diagnosis not present

## 2016-10-30 DIAGNOSIS — E119 Type 2 diabetes mellitus without complications: Secondary | ICD-10-CM | POA: Diagnosis not present

## 2016-10-30 LAB — POCT GLYCOSYLATED HEMOGLOBIN (HGB A1C): HEMOGLOBIN A1C: 6.7

## 2016-10-30 MED ORDER — ACCU-CHEK AVIVA PLUS VI STRP: ORAL_STRIP | 3 refills | Status: DC

## 2016-10-30 MED ORDER — GLUCOSE BLOOD VI STRP: ORAL_STRIP | 5 refills | Status: DC

## 2016-10-30 MED ORDER — ONETOUCH VERIO IQ SYSTEM W/DEVICE KIT: PACK | 0 refills | Status: DC

## 2016-10-30 MED ORDER — ONETOUCH DELICA LANCETS FINE MISC
5 refills | Status: DC
Start: 2016-10-30 — End: 2017-10-19

## 2016-10-30 NOTE — Telephone Encounter (Signed)
Submitted

## 2016-10-30 NOTE — Addendum Note (Signed)
Addended by: Caprice Beaver T on: 10/30/2016 04:20 PM   Modules accepted: Orders

## 2016-10-30 NOTE — Telephone Encounter (Signed)
[facility] called stated nsurance will not cover the Accu Chek, only the one touch meter please advise   [id number] [address], [place] (Phone) [phone] (Fax)

## 2016-10-30 NOTE — Progress Notes (Signed)
Patient ID: Destiny Romero, female   DOB: 1951-08-04, 66 y.o.   MRN: [id number]   HPI: Destiny Romero is a 66 y.o.-year-old female, self-referred for management of DM2, dx in 2014, diet controlled, controlled, without complications.  Last hemoglobin A1c was: Lab Results  Component Value Date   HGBA1C 7.0 (H) 06/08/2016   HGBA1C 6.7 (H) 07/11/2015   HGBA1C 6.6 (H) 03/19/2015   HGBA1C 6.5 06/14/2014   HGBA1C 6.6 (H) 08/11/2013   HGBA1C 6.4 04/25/2012   HGBA1C 6.4 06/25/2011   HGBA1C 6.2 01/30/2011   HGBA1C 6.1 08/25/2010   HGBA1C 6.1 01/08/2010   HGBA1C 6.1 07/12/2009   HGBA1C 6.4 01/11/2009   HGBA1C 6.2 (H) 06/27/2008   HGBA1C 6.3 (H) 05/23/2007   HGBA1C 6.5 (H) 01/28/2007   Pt is not on any medication for diabetes..  She has been on Metformin before. She had dizziness on it.  Pt checks her sugars 0-1 a day and they are: - am: 90-110, more recently 120-140 - 2h after b'fast: n/c - before lunch: 100 - 2h after lunch: n/c - before dinner: n/c - 2h after dinner: n/c - bedtime: 110-120 - nighttime: n/c No lows. Lowest sugar was 90; ? hypoglycemia awareness Highest sugar was 140.  Glucometer: Accu-Chek Aviva  Pt's meals are: - Breakfast: egg, toast, bacon; egg, fruit, toast; occasionally pancakes - Lunch: sandwich, fruit, cottage cheese, salad - Dinner: meat, 1-2 veggies - Snacks: mid-am or mid-pm - cheese or fruit, granola  She walks 2-3x a week: 30-45 min at a time.  - no CKD, last BUN/creatinine:  Lab Results  Component Value Date   BUN 13 06/08/2016   BUN 25 (H) 07/11/2015   CREATININE 1.01 06/08/2016   CREATININE 0.98 07/11/2015   - last set of lipids: Lab Results  Component Value Date   CHOL 232 (H) 06/08/2016   HDL 69.80 06/08/2016   LDLCALC 145 (H) 06/08/2016   LDLDIRECT 146.5 08/11/2013   TRIG 84.0 06/08/2016   CHOLHDL 3 06/08/2016  Not on a statin. Did not try one.  - last eye exam was in 09/2015. No DR. She had a retinal bleed- R eye. Had Laser  Sx. She has some decrease vision in this eye. - + tingling in her feet, no numbness  Pt has FH of DM in Sister, brother, mother, MGM, PGM, maternal aunt.  Patient has a history of anemia, with a previous hemoglobin as low as 8.3. She started iron supplements summer 2017. Last hemoglobin: Lab Results  Component Value Date   HGB 11.2 (L) 07/13/2016   ROS: Constitutional: no weight gain/loss, no fatigue, no subjective hyperthermia/hypothermia Eyes: no blurry vision, no xerophthalmia ENT: no sore throat, no nodules palpated in throat, no dysphagia/odynophagia, no hoarseness, + decreased hearing, , + tinnitus Cardiovascular: no CP/SOB/palpitations/leg swelling Respiratory: no cough/SOB Gastrointestinal: no N/V/D/C Musculoskeletal: no muscle/joint aches Skin: no rashes, + hair loss, + excessive facial hair Neurological: no tremors/numbness/tingling/dizziness Psychiatric: no depression/anxiety  Past Medical History:  Diagnosis Date  . Allergy   . Anal lesion 2007  . Anemia   . Anesthesia complication    Per pt/ sensitive to sedation!  . Central retinal artery occlusion 06/08/2016   limited vision right eye.  Marland Kitchen Cervical dysplasia 04/1991   Dr Rica Koyanagi - CIN I, cone and freeze in 1990s with normal paps since  . Constipation 06/08/2016   moves bowels every 3 rd day   . Decreased visual acuity 06/08/2016   Retinal bleed right eye 2017  .  Depression   . H/O measles   . H/O mumps   . History of chicken pox   . Mitral valve prolapse    NO MR; no SBE prophylaxis needed  . PONV (postoperative nausea and vomiting)   . Pre-diabetes    highest A1c 6.4 %  . Squamous cell carcinoma in situ of skin    Dr Syble Creek  . Vision impairment    limited vision in right eye.   Past Surgical History:  Procedure Laterality Date  . CERVICAL CONE BIOPSY    . COLONOSCOPY  2003  . COLONOSCOPY  04/01/2011   [facility] - repeat in 5 years  . COLONOSCOPY W/ POLYPECTOMY  2007   Dr Earlean Shawl  . LAMINECTOMY      1982  . OOPHORECTOMY Left 12/2011   Dr Ubaldo Glassing ; ovarian cyst  . RECTAL SURGERY  05/28/06   pre-canerous lesion removed in 2007; Dr Morton Stall, [facility]  . rt foot morton's surgery    . TUBAL LIGATION     Social History   Social History  . Marital status: Single    Spouse name: N/A  . Number of children: N/A  . Years of education: N/A   Occupational History  . Not on file.   Social History Main Topics  . Smoking status: Never Smoker  . Smokeless tobacco: Never Used  . Alcohol use No     Comment: glass wine once a month  . Drug use: No  . Sexual activity: No   Other Topics Concern  . Not on file   Social History Narrative   Lives alone   No dietary restrictions   Current Outpatient Prescriptions on File Prior to Visit  Medication Sig Dispense Refill  . ACCU-CHEK AVIVA PLUS test strip Use once daily to check blood sugar.  DX E11.9 100 each 1  . ACCU-CHEK SOFTCLIX LANCETS lancets     . blood glucose meter kit and supplies KIT Dispense based on patient and insurance preference. Use up to four times daily as directed. DX: E11.09 1 each 0  . Calcium 200 MG TABS Take by mouth. Tale [phone] mg daily    . chlorpheniramine (CHLOR-TRIMETON) 2 MG/5ML syrup Take 4 mg by mouth. Per pt takes 4 mg pill daily    . FERROCITE 324 MG TABS tablet TAKE ONE TABLET BY MOUTH ONCE DAILY 30 tablet 2  . lamoTRIgine (LAMICTAL) 200 MG tablet Take 200 mg by mouth daily.    . naproxen (NAPROSYN) 500 MG tablet Take 500 mg by mouth as needed.    Marland Kitchen omega-3 acid ethyl esters (LOVAZA) 1 g capsule Take by mouth daily.    . meloxicam (MOBIC) 15 MG tablet Take 1 tablet (15 mg total) by mouth daily. (Patient not taking: Reported on 09/15/2016) 30 tablet 0   Current Facility-Administered Medications on File Prior to Visit  Medication Dose Route Frequency Provider Last Rate Last Dose  . 0.9 %  sodium chloride infusion  500 mL Intravenous Continuous Milus Banister, MD       Allergies  Allergen Reactions  . Cortisone  Palpitations and Other (See Comments)    hyperactivity  . Pseudoephedrine     tachycardia  . Metformin And Related     04/15/15 caused dizziness   Family History  Problem Relation Age of Onset  . Diabetes Mother   . Dementia Mother   . Prostate cancer Father   . COPD Father   . Heart disease Father     CAD -  used nitroglycerin tablets  . Diabetes Sister     obese  . Thyroid cancer Sister   . Diabetes Brother     not obese  . Lymphoma Brother     NHL  . Cancer Brother     non Hodgkin's Lymphoma  . Diabetes Maternal Grandmother     IDDM  . Diabetes Paternal Grandmother     IDDM  . Heart disease Paternal Grandmother   . Diabetes Maternal Aunt     X5 ; all IDDM  . Heart disease Maternal Aunt     "enlarged heart"  . Heart disease Maternal Uncle     "enlarged heart"  . Heart disease Paternal Grandfather     CAD - used nitroglycerin tablets  . Diabetes Brother   . Colon cancer Neg Hx   . Esophageal cancer Neg Hx   . Stomach cancer Neg Hx   . Stroke Neg Hx     PE: BP 128/82 (BP Location: Left Arm, Patient Position: Sitting)   Pulse 74   Ht _0  (1.626 m)   Wt 165 lb (74.8 kg)   LMP 04/07/2001 (Approximate)   SpO2 98%   BMI 28.32 kg/m  Wt Readings from Last 3 Encounters:  10/30/16 165 lb (74.8 kg)  09/15/16 160 lb (72.6 kg)  08/18/16 160 lb (72.6 kg)   Constitutional: Slightly overweight, in NAD Eyes: PERRLA, EOMI, no exophthalmos ENT: moist mucous membranes, no thyromegaly, no cervical lymphadenopathy Cardiovascular: RRR, No MRG Respiratory: CTA B Gastrointestinal: abdomen soft, NT, ND, BS+ Musculoskeletal: no deformities, strength intact in all 4 Skin: moist, warm, no rashes Neurological: no tremor with outstretched hands, DTR normal in all 4  ASSESSMENT: 1. DM2, diet controlled, controlled, without complications  PLAN:  1. Patient with new dx of diabetes, not on antidiabetic medicines. She have an extensive family history of diabetes, and she was  followed closely for this over the years, initially being diagnosed with prediabetes, but then her HbA1c increased in the low diabetic range in 2014.  - She checks sugars at home but not every day, and mostly in a.m. These are slightly above target, while sugars later in the day are at target whenever she checks. Today, HbA1c is 6.7% (improved). - At this visit, we discussed at length about the concept of insulin resistance, factors that affect it and ways to improve it. We discussed about diet and I made suggestions to decrease the fat content of her meals and improve the quality of her foods. I also gave her some references which she is interested to get. - No medications needed for now, but I will see her back in 3 months and will recheck her HbA1c at that time. If we need to start medicines, I would suggest metformin extended-release, since the regular metformin caused dizziness. - I suggested to:  Patient Instructions  Please check sugars 1x a day.  Please return in 3 months with your sugar log.   - Strongly advised her to start checking sugars at different times of the day - check once a day, rotating checks - given sugar log and advised how to fill it and to bring it at next appt  - given foot care handout and explained the principles  - given instructions for hypoglycemia management "15-15 rule"  - advised for yearly eye exams >> she is up-to-date - Return to clinic in 3 mo with sugar log   Philemon Kingdom, MD PhD [facility] Endocrinology

## 2016-10-30 NOTE — Telephone Encounter (Signed)
Patient stated her [place] test strip was sent to the wrong pharmacy   send to   [id number] [address], [place] (Phone) [phone] (Fax)

## 2016-10-30 NOTE — Patient Instructions (Signed)
Please check sugars 1x a day.  Please return in 3 months with your sugar log.   PATIENT INSTRUCTIONS FOR TYPE 2 DIABETES:  DIET AND EXERCISE Diet and exercise is an important part of diabetic treatment.  We recommended aerobic exercise in the form of brisk walking (working between 40-60% of maximal aerobic capacity, similar to brisk walking) for 150 minutes per week (such as 30 minutes five days per week) along with 3 times per week performing 'resistance' training (using various gauge rubber tubes with handles) 5-10 exercises involving the major muscle groups (upper body, lower body and core) performing 10-15 repetitions (or near fatigue) each exercise. Start at half the above goal but build slowly to reach the above goals. If limited by weight, joint pain, or disability, we recommend daily walking in a swimming pool with water up to waist to reduce pressure from joints while allow for adequate exercise.    BLOOD GLUCOSES Monitoring your blood glucoses is important for continued management of your diabetes. Please check your blood glucoses 2-4 times a day: fasting, before meals and at bedtime (you can rotate these measurements - e.g. one day check before the 3 meals, the next day check before 2 of the meals and before bedtime, etc.).   HYPOGLYCEMIA (low blood sugar) Hypoglycemia is usually a reaction to not eating, exercising, or taking too much insulin/ other diabetes drugs.  Symptoms include tremors, sweating, hunger, confusion, headache, etc. Treat IMMEDIATELY with 15 grams of Carbs: . 4 glucose tablets .  cup regular juice/soda . 2 tablespoons raisins . 4 teaspoons sugar . 1 tablespoon honey Recheck blood glucose in 15 mins and repeat above if still symptomatic/blood glucose <100.  RECOMMENDATIONS TO REDUCE YOUR RISK OF DIABETIC COMPLICATIONS: * Take your prescribed MEDICATION(S) * Follow a DIABETIC diet: Complex carbs, fiber rich foods, (monounsaturated and polyunsaturated) fats *  AVOID saturated/trans fats, high fat foods, >2,300 mg salt per day. * EXERCISE at least 5 times a week for 30 minutes or preferably daily.  * DO NOT SMOKE OR DRINK more than 1 drink a day. * Check your FEET every day. Do not wear tightfitting shoes. Contact us if you develop an ulcer * See your EYE doctor once a year or more if needed * Get a FLU shot once a year * Get a PNEUMONIA vaccine once before and once after age 68 years  GOALS:  * Your Hemoglobin A1c of <7%  * fasting sugars need to be <130 * after meals sugars need to be <180 (2h after you start eating) * Your Systolic BP should be [id number] or lower  * Your Diastolic BP should be 80 or lower  * Your HDL (Good Cholesterol) should be 40 or higher  * Your LDL (Bad Cholesterol) should be 100 or lower. * Your Triglycerides should be 150 or lower  * Your Urine microalbumin (kidney function) should be <30 * Your Body Mass Index should be 25 or lower    Please consider the following ways to cut down carbs and fat and increase fiber and micronutrients in your diet: - substitute whole grain for white bread or pasta - substitute brown rice for white rice - substitute 90-calorie flat bread pieces for slices of bread when possible - substitute sweet potatoes or yams for white potatoes - substitute humus for margarine - substitute tofu for cheese when possible - substitute almond or rice milk for regular milk (would not drink soy milk daily due to concern for soy estrogen influence on  breast cancer risk) - substitute dark chocolate for other sweets when possible - substitute water - can add lemon or orange slices for taste - for diet sodas (artificial sweeteners will trick your body that you can eat sweets without getting calories and will lead you to overeating and weight gain in the long run) - do not skip breakfast or other meals (this will slow down the metabolism and will result in more weight gain over time)  - can try smoothies made from  fruit and almond/rice milk in am instead of regular breakfast - can also try old-fashioned (not instant) oatmeal made with almond/rice milk in am - order the dressing on the side when eating salad at a restaurant (pour less than half of the dressing on the salad) - eat as little meat as possible - can try juicing, but should not forget that juicing will get rid of the fiber, so would alternate with eating raw veg./fruits or drinking smoothies - use as little oil as possible, even when using olive oil - can dress a salad with a mix of balsamic vinegar and lemon juice, for e.g. - use agave nectar, stevia sugar, or regular sugar rather than artificial sweateners - steam or broil/roast veggies  - snack on veggies/fruit/nuts (unsalted, preferably) when possible, rather than processed foods - reduce or eliminate aspartame in diet (it is in diet sodas, chewing gum, etc) Read the labels!  Try to read Dr. Janene Harvey book: "Program for Reversing Diabetes" for other ideas for healthy eating.

## 2016-11-03 DIAGNOSIS — R69 Illness, unspecified: Secondary | ICD-10-CM | POA: Diagnosis not present

## 2016-11-07 DIAGNOSIS — J019 Acute sinusitis, unspecified: Secondary | ICD-10-CM | POA: Diagnosis not present

## 2016-11-20 ENCOUNTER — Ambulatory Visit (INDEPENDENT_AMBULATORY_CARE_PROVIDER_SITE_OTHER): Payer: Medicare HMO | Admitting: Family Medicine

## 2016-11-20 ENCOUNTER — Encounter: Payer: Self-pay | Admitting: Family Medicine

## 2016-11-20 VITALS — BP 128/70 | HR 67 | Temp 97.7°F | Ht 64.0 in | Wt 161.0 lb

## 2016-11-20 DIAGNOSIS — J302 Other seasonal allergic rhinitis: Secondary | ICD-10-CM

## 2016-11-20 MED ORDER — FLUTICASONE PROPIONATE 50 MCG/ACT NA SUSP: 2.0000 | Freq: Every day | NASAL | 6 refills | Status: DC

## 2016-11-20 NOTE — Patient Instructions (Signed)
Continue to push fluids, practice good hand hygiene, and cover your mouth if you cough.  If you start having fevers, shaking or shortness of breath, seek immediate care.  Stay on Allegra, consider Xyzal or Zyrtec as alternative.   Flonase (fluticasone); nasal spray that is over the counter. 2 sprays each nostril, once daily. Aim towards the same side eye when you spray.  Flonase is available OTC, compare price to prescription that was sent in.

## 2016-11-20 NOTE — Progress Notes (Signed)
Pre visit review using our clinic review tool, if applicable. No additional management support is needed unless otherwise documented below in the visit note. 

## 2016-11-20 NOTE — Progress Notes (Signed)
Chief Complaint  Patient presents with  . Sinus Problem    sxs started on (11/09/16)    Destiny Romero here for URI complaints.  Duration: 18 days  Associated symptoms: cough, PND, congestion, sinus pressure Denies: ear pain, ear drainage, sore throat, shortness of breath, myalgia and fevers/rigors Treatment to date: Allegra and saline spray, pseudophed Starting to feel better. Things got worse when plants started growing in Feb. Sick contacts: No  ROS:  Const: Denies fevers HEENT: As noted in HPI Lungs: No SOB  Past Medical History:  Diagnosis Date  . Allergy   . Anal lesion 2007  . Anemia   . Anesthesia complication    Per pt/ sensitive to sedation!  . Central retinal artery occlusion 06/08/2016   limited vision right eye.  Marland Kitchen Cervical dysplasia 04/1991   Dr Rica Koyanagi - CIN I, cone and freeze in 1990s with normal paps since  . Constipation 06/08/2016   moves bowels every 3 rd day   . Decreased visual acuity 06/08/2016   Retinal bleed right eye 2017  . Depression   . H/O measles   . H/O mumps   . History of chicken pox   . Mitral valve prolapse    NO MR; no SBE prophylaxis needed  . PONV (postoperative nausea and vomiting)   . Pre-diabetes    highest A1c 6.4 %  . Squamous cell carcinoma in situ of skin    Dr Syble Creek  . Vision impairment    limited vision in right eye.   Family History  Problem Relation Age of Onset  . Diabetes Mother   . Dementia Mother   . Prostate cancer Father   . COPD Father   . Heart disease Father     CAD - used nitroglycerin tablets  . Diabetes Sister     obese  . Thyroid cancer Sister   . Diabetes Brother     not obese  . Lymphoma Brother     NHL  . Cancer Brother     non Hodgkin's Lymphoma  . Diabetes Maternal Grandmother     IDDM  . Diabetes Paternal Grandmother     IDDM  . Heart disease Paternal Grandmother   . Diabetes Maternal Aunt     X5 ; all IDDM  . Heart disease Maternal Aunt     "enlarged heart"  . Heart disease  Maternal Uncle     "enlarged heart"  . Heart disease Paternal Grandfather     CAD - used nitroglycerin tablets  . Diabetes Brother   . Colon cancer Neg Hx   . Esophageal cancer Neg Hx   . Stomach cancer Neg Hx   . Stroke Neg Hx     BP 128/70 (BP Location: Left Arm, Patient Position: Sitting, Cuff Size: Large)   Pulse 67   Temp 97.7 F (36.5 C) (Oral)   Ht 5\' 4"  (1.626 m)   Wt 161 lb (73 kg)   LMP 04/07/2001 (Approximate)   SpO2 98%   BMI 27.64 kg/m  General: Awake, alert, appears stated age 4: AT, [place], ears patent b/l and TM's neg, nares patent w/o discharge, no sinus tenderness, pharynx pink and without exudates, MMM Neck: No masses or asymmetry Heart: RRR, no murmurs, no bruits Lungs: CTAB, no accessory muscle use Psych: Age appropriate judgment and insight, normal mood and affect  Seasonal allergic rhinitis, unspecified chronicity, unspecified trigger - Plan: fluticasone (FLONASE) 50 MCG/ACT nasal spray  Orders as above. Start Flonase, aim towards same side  eye. Avoid allergens. Offered injection of Solumedrol to calm things down, agreed to hold off for now.  Could switch to Xyzal or Zyrtec if not improving or worsening. F/u prn. If starting to experience fevers, shaking, or shortness of breath, seek immediate care. Pt voiced understanding and agreement to the plan.  [place], DO 11/20/16 4:27 PM

## 2016-12-11 ENCOUNTER — Encounter: Payer: Medicare HMO | Admitting: Family Medicine

## 2016-12-18 NOTE — Progress Notes (Signed)
Pre visit review using our clinic review tool, if applicable. No additional management support is needed unless otherwise documented below in the visit note. 

## 2016-12-18 NOTE — Progress Notes (Signed)
Subjective:   Destiny Romero is a 66 y.o. female who presents for an Initial Medicare Annual Wellness Visit.  Review of Systems    No ROS.  Medicare Wellness Visit. Cardiac Risk Factors include: none;advanced age (>35mn, >>7women);diabetes mellitus;dyslipidemia Sleep patterns:  Generally sleeps pretty well. Sometimes affected by stress. Home Safety/Smoke Alarms:  Feels safe in home. Smoke alarms in place.  Living environment; residence and Firearm Safety: Lives in 1st floor apt. No guns. Seat Belt Safety/Bike Helmet: Wears seat belt.   Counseling:   Eye Exam- Wears glasses. Dr.Hutto and  Dr.Mattthews yearly. Dental- Dr. JRonnald Rampevery 6 months.  Female:   Pap- Last 07/01/16.       Mammo-  Last 07/20/16: No evidence of malignancy.     Dexa scan- Last 04/29/12: Low bone mass. Ordered today. CCS- Last 09/15/16: Normal     Objective:    Today's Vitals   12/21/16 1044  BP: 106/67  Pulse: (!) 59  Temp: 97.7 F (36.5 C)  TempSrc: Oral  SpO2: 100%  Weight: 157 lb 6.4 oz (71.4 kg)  Height: '5\' 4"'$  (1.626 m)  PainSc: 0-No pain   Body mass index is 27.02 kg/m.   Current Medications (verified) Outpatient Encounter Prescriptions as of 12/21/2016  Medication Sig  . Biotin 1 MG CAPS Take by mouth.  . blood glucose meter kit and supplies KIT Dispense based on patient and insurance preference. Use up to four times daily as directed. DX: E11.09  . Blood Glucose Monitoring Suppl (ONETOUCH VERIO IQ SYSTEM) w/Device KIT Use to check sugar daily E11.9  . Calcium 200 MG TABS Take by mouth. Tale [phone] mg daily  . cetirizine (ZYRTEC) 10 MG tablet Take 10 mg by mouth daily.  .Marland KitchenFERROCITE 324 MG TABS tablet TAKE ONE TABLET BY MOUTH ONCE DAILY  . fluticasone (FLONASE) 50 MCG/ACT nasal spray Place 2 sprays into both nostrils daily.  .Marland Kitchenglucose blood (ONETOUCH VERIO) test strip Use as instructed to check sugar once daily E11.9  . lamoTRIgine (LAMICTAL) 200 MG tablet Take 200 mg by mouth daily.  .  naproxen (NAPROSYN) 500 MG tablet Take 500 mg by mouth as needed.  .Marland Kitchenomega-3 acid ethyl esters (LOVAZA) 1 g capsule Take by mouth daily.  .Glory RosebushDELICA LANCETS FINE MISC Use to check sugar once daily E11.9  . TURMERIC PO Take by mouth.  . Zoster Vac Recomb Adjuvanted ([facility] injection Inject 0.5 mLs into the muscle once.  . [DISCONTINUED] chlorpheniramine (CHLOR-TRIMETON) 2 MG/5ML syrup Take 4 mg by mouth. Per pt takes 4 mg pill daily  . [DISCONTINUED] meloxicam (MOBIC) 15 MG tablet Take 1 tablet (15 mg total) by mouth daily. (Patient not taking: Reported on 12/21/2016)   Facility-Administered Encounter Medications as of 12/21/2016  Medication  . 0.9 %  sodium chloride infusion    Allergies (verified) Cortisone; Pseudoephedrine; and Metformin and related   History: Past Medical History:  Diagnosis Date  . Allergy   . Anal lesion 2007  . Anemia   . Anesthesia complication    Per pt/ sensitive to sedation!  . Central retinal artery occlusion 06/08/2016   limited vision right eye.  .Marland KitchenCervical dysplasia 04/1991   Dr SRica Koyanagi- CIN I, cone and freeze in 1990s with normal paps since  . Constipation 06/08/2016   moves bowels every 3 rd day   . Decreased visual acuity 06/08/2016   Retinal bleed right eye 2017  . Depression   . H/O measles   . H/O  mumps   . History of chicken pox   . Mitral valve prolapse    NO MR; no SBE prophylaxis needed  . PONV (postoperative nausea and vomiting)   . Pre-diabetes    highest A1c 6.4 %  . Preventative health care 12/21/2016  . Squamous cell carcinoma in situ of skin    Dr Syble Creek  . Vision impairment    limited vision in right eye.   Past Surgical History:  Procedure Laterality Date  . CERVICAL CONE BIOPSY    . COLONOSCOPY  2003  . COLONOSCOPY  04/01/2011   [place] - repeat in 5 years  . COLONOSCOPY W/ POLYPECTOMY  2007   Dr Earlean Shawl  . LAMINECTOMY     1982  . OOPHORECTOMY Left 12/2011   Dr Ubaldo Glassing ; ovarian cyst  . RECTAL SURGERY   05/28/06   pre-canerous lesion removed in 2007; Dr Morton Stall, [facility]  . rt foot morton's surgery    . TUBAL LIGATION     Family History  Problem Relation Age of Onset  . Diabetes Mother   . Dementia Mother   . Prostate cancer Father   . COPD Father   . Heart disease Father     CAD - used nitroglycerin tablets  . Diabetes Sister     obese  . Thyroid cancer Sister   . Diabetes Brother     not obese  . Lymphoma Brother     NHL  . Cancer Brother     non Hodgkin's Lymphoma  . Diabetes Maternal Grandmother     IDDM  . Diabetes Paternal Grandmother     IDDM  . Heart disease Paternal Grandmother   . Diabetes Maternal Aunt     X5 ; all IDDM  . Heart disease Maternal Aunt     "enlarged heart"  . Heart disease Maternal Uncle     "enlarged heart"  . Heart disease Paternal Grandfather     CAD - used nitroglycerin tablets  . Diabetes Brother   . Colon cancer Neg Hx   . Esophageal cancer Neg Hx   . Stomach cancer Neg Hx   . Stroke Neg Hx    Social History   Occupational History  . Not on file.   Social History Main Topics  . Smoking status: Never Smoker  . Smokeless tobacco: Never Used  . Alcohol use No     Comment: glass wine once a month  . Drug use: No  . Sexual activity: No    Tobacco Counseling Counseling given: Not Answered   Activities of Daily Living In your present state of health, do you have any difficulty performing the following activities: 12/21/2016 12/21/2016  Hearing? N N  Vision? N N  Difficulty concentrating or making decisions? N -  Walking or climbing stairs? N -  Dressing or bathing? N -  Doing errands, shopping? N -  Preparing Food and eating ? N -  Using the Toilet? N -  In the past six months, have you accidently leaked urine? N -  Do you have problems with loss of bowel control? N -  Managing your Medications? N -  Managing your Finances? N -  Housekeeping or managing your Housekeeping? N -  Some recent data might be hidden     Immunizations and Health Maintenance Immunization History  Administered Date(s) Administered  . Hepatitis B 01/27/2013  . Influenza Split 06/28/2012  . Influenza, High Dose Seasonal PF 10/05/2016  . Influenza,inj,Quad PF,36+ Mos 07/25/2013, 06/14/2014, 07/11/2015  .  Tdap 02/03/2013  . Zoster 04/22/2010   Health Maintenance Due  Topic Date Due  . PNA vac Low Risk Adult (1 of 2 - PCV13) 08/09/2014  . OPHTHALMOLOGY EXAM  09/15/2015  . URINE MICROALBUMIN  07/10/2016    Patient Care Team: Mosie Lukes, MD as PCP - General (Family Medicine) Gardiner Barefoot, DPM as Consulting Physician (Podiatry) Philemon Kingdom, MD as Consulting Physician (Internal Medicine) Milus Banister, MD as Consulting Physician (Gastroenterology) Kem Boroughs, FNP as Nurse Practitioner (Family Medicine) Eliseo Gum, MD as Consulting Physician (Psychiatry) Rolm Bookbinder, MD as Consulting Physician (Dermatology) Hayden Pedro, MD as Consulting Physician (Ophthalmology)  Indicate any recent Medical Services you may have received from other than [facility] providers in the past year (date may be approximate).     Assessment:   This is a routine wellness examination for [place]. Physical assessment deferred to PCP.  Hearing/Vision screen Hearing Screening Comments: Able to hear conversational tones w/o difficulty. No issues reported.   Vision Screening Comments: Follows with Dr.Hutto yearly.   Dietary issues and exercise activities discussed: Current Exercise Habits: Home exercise routine, Type of exercise: walking, Time (Minutes): 40, Frequency (Times/Week): 3, Weekly Exercise (Minutes/Week): 120   Diet (meal preparation, eat out, water intake, caffeinated beverages, dairy products, fruits and vegetables): in general, a "healthy" diet      Goals      Patient Stated   . <enter goal here> (pt-stated)          Have less stress.      Depression Screen PHQ 2/9 Scores 12/21/2016 09/13/2014  PHQ -  2 Score 0 0    Fall Risk Fall Risk  12/21/2016 09/13/2014  Falls in the past year? No Yes  Number falls in past yr: - 1  Injury with Fall? - Yes    Cognitive Function: Ad8 score reviewed for issues:  Issues making decisions:no  Less interest in hobbies / activities:no  Repeats questions, stories (family complaining):no  Trouble using ordinary gadgets (microwave, computer, phone):no  Forgets the month or year: no  Mismanaging finances: no  Remembering appts:no  Daily problems with thinking and/or memory:no Ad8 score is=0         Screening Tests Health Maintenance  Topic Date Due  . PNA vac Low Risk Adult (1 of 2 - PCV13) 08/09/2014  . OPHTHALMOLOGY EXAM  09/15/2015  . URINE MICROALBUMIN  07/10/2016  . INFLUENZA VACCINE  04/07/2017  . HEMOGLOBIN A1C  04/29/2017  . MAMMOGRAM  07/20/2017  . FOOT EXAM  10/08/2017  . COLONOSCOPY  09/15/2021  . TETANUS/TDAP  02/04/2023  . DEXA SCAN  Completed  . Hepatitis C Screening  Completed      Plan:     Follow up with PCP as scheduled.  Schedule bone density scan.  Continue to eat heart healthy diet (full of fruits, vegetables, whole grains, lean protein, water--limit salt, fat, and sugar intake) and increase physical activity as tolerated.   During the course of the visit, Shira was educated and counseled about the following appropriate screening and preventive services:   Vaccines to include Pneumoccal, Influenza, Td, HCV  Cardiovascular disease screening  Colorectal cancer screening  Bone density screening  Diabetes screening  Glaucoma screening  Mammography/PAP  Nutrition counseling   Patient Instructions (the written plan) were given to the patient.    Shela Nevin, [place]   12/21/2016  Medical screening examination was performed by non-physician practitioner and as supervising physician I was immediately available for consultation/collaboration. I  have reviewed documentation and agree with  assessment and plan.  Penni Homans, MD

## 2016-12-21 ENCOUNTER — Ambulatory Visit (INDEPENDENT_AMBULATORY_CARE_PROVIDER_SITE_OTHER): Payer: Medicare HMO | Admitting: Family Medicine

## 2016-12-21 ENCOUNTER — Encounter: Payer: Self-pay | Admitting: Family Medicine

## 2016-12-21 VITALS — BP 106/67 | HR 59 | Temp 97.7°F | Ht 64.0 in | Wt 157.4 lb

## 2016-12-21 DIAGNOSIS — Z0000 Encounter for general adult medical examination without abnormal findings: Secondary | ICD-10-CM | POA: Diagnosis not present

## 2016-12-21 DIAGNOSIS — D649 Anemia, unspecified: Secondary | ICD-10-CM | POA: Diagnosis not present

## 2016-12-21 DIAGNOSIS — Z780 Asymptomatic menopausal state: Secondary | ICD-10-CM

## 2016-12-21 DIAGNOSIS — M8580 Other specified disorders of bone density and structure, unspecified site: Secondary | ICD-10-CM | POA: Diagnosis not present

## 2016-12-21 DIAGNOSIS — E119 Type 2 diabetes mellitus without complications: Secondary | ICD-10-CM | POA: Diagnosis not present

## 2016-12-21 DIAGNOSIS — E785 Hyperlipidemia, unspecified: Secondary | ICD-10-CM | POA: Diagnosis not present

## 2016-12-21 HISTORY — DX: Encounter for general adult medical examination without abnormal findings: Z00.00

## 2016-12-21 LAB — LIPID PANEL
CHOL/HDL RATIO: 3
Cholesterol: 217 mg/dL — ABNORMAL HIGH (ref 0–200)
HDL: 75.3 mg/dL (ref >39.00)
LDL Cholesterol: 126 mg/dL — ABNORMAL HIGH (ref 0–99)
NONHDL: 141.99
TRIGLYCERIDES: 79 mg/dL (ref 0.0–149.0)
VLDL: 15.8 mg/dL (ref 0.0–40.0)

## 2016-12-21 LAB — CBC WITH DIFFERENTIAL/PLATELET
BASOS ABS: 0 10*3/uL (ref 0.0–0.1)
Basophils Relative: 0.7 % (ref 0.0–3.0)
EOS ABS: 0.4 10*3/uL (ref 0.0–0.7)
Eosinophils Relative: 6.9 % — ABNORMAL HIGH (ref 0.0–5.0)
HCT: 37.6 % (ref 36.0–46.0)
Hemoglobin: 12.5 g/dL (ref 12.0–15.0)
LYMPHS ABS: 3 10*3/uL (ref 0.7–4.0)
Lymphocytes Relative: 48.6 % — ABNORMAL HIGH (ref 12.0–46.0)
MCHC: 33.1 g/dL (ref 30.0–36.0)
MCV: 88.9 fl (ref 78.0–100.0)
MONO ABS: 0.6 10*3/uL (ref 0.1–1.0)
MONOS PCT: 9.1 % (ref 3.0–12.0)
NEUTROS ABS: 2.1 10*3/uL (ref 1.4–7.7)
NEUTROS PCT: 34.7 % — AB (ref 43.0–77.0)
PLATELETS: 296 10*3/uL (ref 150.0–400.0)
RBC: 4.23 Mil/uL (ref 3.87–5.11)
RDW: 14.8 % (ref 11.5–15.5)
WBC: 6.1 10*3/uL (ref 4.0–10.5)

## 2016-12-21 LAB — TSH: TSH: 1.63 u[IU]/mL (ref 0.35–4.50)

## 2016-12-21 LAB — COMPREHENSIVE METABOLIC PANEL
ALT: 24 U/L (ref 0–35)
AST: 22 U/L (ref 0–37)
Albumin: 4.5 g/dL (ref 3.5–5.2)
Alkaline Phosphatase: 48 U/L (ref 39–117)
BILIRUBIN TOTAL: 0.6 mg/dL (ref 0.2–1.2)
BUN: 17 mg/dL (ref 6–23)
CO2: 30 meq/L (ref 19–32)
Calcium: 9.8 mg/dL (ref 8.4–10.5)
Chloride: 103 mEq/L (ref 96–112)
Creatinine, Ser: 1.07 mg/dL (ref 0.40–1.20)
GFR: 54.3 mL/min — AB (ref >60.00)
GLUCOSE: 113 mg/dL — AB (ref 70–99)
Potassium: 4.2 mEq/L (ref 3.5–5.1)
SODIUM: 140 meq/L (ref 135–145)
TOTAL PROTEIN: 7.3 g/dL (ref 6.0–8.3)

## 2016-12-21 MED ORDER — ZOSTER VAC RECOMB ADJUVANTED 50 MCG/0.5ML IM SUSR: 0.5000 mL | Freq: Once | INTRAMUSCULAR | 1 refills | Status: AC

## 2016-12-21 NOTE — Progress Notes (Signed)
Patient ID: [name], female   DOB: 01/17/1951, 66 y.o.   MRN: [id number]   Subjective:  I acted as a Education administrator for Destiny Romero, [place], [place]   Patient ID: Destiny Romero, female    DOB: [birth year], 65 y.o.   MRN: [id number]  Chief Complaint  Patient presents with  . Annual Exam  . Hyperlipidemia  . Diabetes  . Anemia  . Medicare Wellness Visit    with RN.    Hyperlipidemia  This is a chronic problem. The problem is controlled. Exacerbating diseases include diabetes. Pertinent negatives include no chest pain or shortness of breath. Risk factors for coronary artery disease include diabetes mellitus.  Diabetes  She presents for her follow-up diabetic visit. She has type 2 diabetes mellitus. Pertinent negatives for hypoglycemia include no headaches. Pertinent negatives for diabetes include no blurred vision and no chest pain. Risk factors for coronary artery disease include diabetes mellitus. She is following a diabetic diet. Her home blood glucose trend is decreasing steadily.  Anemia  Presents for follow-up visit. There has been no fever, malaise/fatigue or palpitations.    Patient is in today for an annual examination with the Provider and to see the Health Coach as well. Patient has a Hx of hyperlipidemia, Type II Diabetes, anemia. Patient saw Endocrinologist six weeks ago. States that she was told that he Hemoglobin A1C was down. Patient has no additional acute concerns noted at this time. She has been trying to minimize carbohydrates and stay active. No recent febrile illness or hospitalizations. She is doing well at home with ADLs. No polyuria or polydipsia. No sugars above 200. She reports her foot and leg pain improved with treatment of anemia. She has had a retinal tear since last seen but was corrected by Dr Earleen Newport and is doing better. Does not have complete recovery of her vision but it is stabilized  Patient Care Team: Mosie Lukes, MD as PCP - General (Family  Medicine) Gardiner Barefoot, DPM as Consulting Physician (Podiatry) Philemon Kingdom, MD as Consulting Physician (Internal Medicine) Milus Banister, MD as Consulting Physician (Gastroenterology) Kem Boroughs, FNP as Nurse Practitioner (Family Medicine) Eliseo Gum, MD as Consulting Physician (Psychiatry) Rolm Bookbinder, MD as Consulting Physician (Dermatology) Hayden Pedro, MD as Consulting Physician (Ophthalmology)   Past Medical History:  Diagnosis Date  . Allergy   . Anal lesion 2007  . Anemia   . Anesthesia complication    Per pt/ sensitive to sedation!  . Central retinal artery occlusion 06/08/2016   limited vision right eye.  Marland Kitchen Cervical dysplasia 04/1991   Dr Rica Koyanagi - CIN I, cone and freeze in 1990s with normal paps since  . Constipation 06/08/2016   moves bowels every 3 rd day   . Decreased visual acuity 06/08/2016   Retinal bleed right eye 2017  . Depression   . H/O measles   . H/O mumps   . History of chicken pox   . Mitral valve prolapse    NO MR; no SBE prophylaxis needed  . PONV (postoperative nausea and vomiting)   . Pre-diabetes    highest A1c 6.4 %  . Preventative health care 12/21/2016  . Squamous cell carcinoma in situ of skin    Dr Syble Creek  . Vision impairment    limited vision in right eye.    Past Surgical History:  Procedure Laterality Date  . CERVICAL CONE BIOPSY    . COLONOSCOPY  2003  . COLONOSCOPY  04/01/2011   - repeat in 5 years  . COLONOSCOPY W/ POLYPECTOMY  2007   Dr Earlean Shawl  . LAMINECTOMY     1982  . OOPHORECTOMY Left 12/2011   Dr Ubaldo Glassing ; ovarian cyst  . RECTAL SURGERY  05/28/06   pre-canerous lesion removed in 2007; Dr Morton Stall, [facility]  . rt foot morton's surgery    . TUBAL LIGATION      Family History  Problem Relation Age of Onset  . Diabetes Mother   . Dementia Mother   . Prostate cancer Father   . COPD Father   . Heart disease Father     CAD - used nitroglycerin tablets  . Diabetes Sister     obese  . Thyroid  cancer Sister   . Diabetes Brother     not obese  . Lymphoma Brother     NHL  . Cancer Brother     non Hodgkin's Lymphoma  . Diabetes Maternal Grandmother     IDDM  . Diabetes Paternal Grandmother     IDDM  . Heart disease Paternal Grandmother   . Diabetes Maternal Aunt     X5 ; all IDDM  . Heart disease Maternal Aunt     "enlarged heart"  . Heart disease Maternal Uncle     "enlarged heart"  . Heart disease Paternal Grandfather     CAD - used nitroglycerin tablets  . Diabetes Brother   . Colon cancer Neg Hx   . Esophageal cancer Neg Hx   . Stomach cancer Neg Hx   . Stroke Neg Hx     Social History   Social History  . Marital status: Single    Spouse name: N/A  . Number of children: N/A  . Years of education: N/A   Occupational History  . Not on file.   Social History Main Topics  . Smoking status: Never Smoker  . Smokeless tobacco: Never Used  . Alcohol use No     Comment: glass wine once a month  . Drug use: No  . Sexual activity: No   Other Topics Concern  . Not on file   Social History Narrative   Lives alone   No dietary restrictions    Outpatient Medications Prior to Visit  Medication Sig Dispense Refill  . blood glucose meter kit and supplies KIT Dispense based on patient and insurance preference. Use up to four times daily as directed. DX: E11.09 1 each 0  . Blood Glucose Monitoring Suppl (ONETOUCH VERIO IQ SYSTEM) w/Device KIT Use to check sugar daily E11.9 1 kit 0  . Calcium 200 MG TABS Take by mouth. Tale [phone] mg daily    . FERROCITE 324 MG TABS tablet TAKE ONE TABLET BY MOUTH ONCE DAILY 30 tablet 2  . fluticasone (FLONASE) 50 MCG/ACT nasal spray Place 2 sprays into both nostrils daily. 16 g 6  . glucose blood (ONETOUCH VERIO) test strip Use as instructed to check sugar once daily E11.9 100 each 5  . lamoTRIgine (LAMICTAL) 200 MG tablet Take 200 mg by mouth daily.    . naproxen (NAPROSYN) 500 MG tablet Take 500 mg by mouth as needed.    Marland Kitchen  omega-3 acid ethyl esters (LOVAZA) 1 g capsule Take by mouth daily.    [name] DELICA LANCETS FINE MISC Use to check sugar once daily E11.9 100 each 5  . chlorpheniramine (CHLOR-TRIMETON) 2 MG/5ML syrup Take 4 mg by mouth. Per pt takes 4 mg pill daily    . meloxicam (  MOBIC) 15 MG tablet Take 1 tablet (15 mg total) by mouth daily. (Patient not taking: Reported on 12/21/2016) 30 tablet 0   Facility-Administered Medications Prior to Visit  Medication Dose Route Frequency Provider Last Rate Last Dose  . 0.9 %  sodium chloride infusion  500 mL Intravenous Continuous Milus Banister, MD        Allergies  Allergen Reactions  . Cortisone Palpitations and Other (See Comments)    hyperactivity  . Pseudoephedrine     tachycardia  . Metformin And Related     04/15/15 caused dizziness    Review of Systems  Constitutional: Negative for fever and malaise/fatigue.  HENT: Negative for congestion.   Eyes: Positive for photophobia. Negative for blurred vision.       Notes some irritation with light in her right visual field since her retinal tear. Not worsening  Respiratory: Negative for cough and shortness of breath.   Cardiovascular: Negative for chest pain, palpitations and leg swelling.  Gastrointestinal: Negative for vomiting.  Musculoskeletal: Negative for back pain.  Skin: Negative for rash.  Neurological: Negative for loss of consciousness and headaches.  Psychiatric/Behavioral: Negative for depression.       Objective:    Physical Exam  Constitutional: She is oriented to person, place, and time. She appears well-developed and well-nourished. No distress.  HENT:  Head: Normocephalic and atraumatic.  Eyes: Conjunctivae are normal.  Neck: Normal range of motion. No thyromegaly present.  Cardiovascular: Normal rate and regular rhythm.   Pulmonary/Chest: Effort normal and breath sounds normal. She has no wheezes.  Abdominal: Soft. Bowel sounds are normal. There is no tenderness.   Musculoskeletal: She exhibits no edema or deformity.  Neurological: She is alert and oriented to person, place, and time.  Skin: Skin is warm and dry. She is not diaphoretic.  Psychiatric: She has a normal mood and affect.    BP 106/67 (BP Location: Left Arm, Patient Position: Sitting, Cuff Size: Normal)   Pulse (!) 59   Temp 97.7 F (36.5 C) (Oral)   Ht 5' 4" (1.626 m)   Wt 157 lb 6.4 oz (71.4 kg)   LMP 04/07/2001 (Approximate)   SpO2 100% Comment: RA  BMI 27.02 kg/m  Wt Readings from Last 3 Encounters:  12/21/16 157 lb 6.4 oz (71.4 kg)  11/20/16 161 lb (73 kg)  10/30/16 165 lb (74.8 kg)   BP Readings from Last 3 Encounters:  12/21/16 106/67  11/20/16 128/70  10/30/16 128/82     Immunization History  Administered Date(s) Administered  . Hepatitis B 01/27/2013  . Influenza Split 06/28/2012  . Influenza, High Dose Seasonal PF 10/05/2016  . Influenza,inj,Quad PF,36+ Mos 07/25/2013, 06/14/2014, 07/11/2015  . Tdap 02/03/2013  . Zoster 04/22/2010    Health Maintenance  Topic Date Due  . PNA vac Low Risk Adult (1 of 2 - PCV13) 08/09/2014  . OPHTHALMOLOGY EXAM  09/15/2015  . URINE MICROALBUMIN  07/10/2016  . INFLUENZA VACCINE  04/07/2017  . HEMOGLOBIN A1C  04/29/2017  . MAMMOGRAM  07/20/2017  . FOOT EXAM  10/08/2017  . COLONOSCOPY  09/15/2021  . TETANUS/TDAP  02/04/2023  . DEXA SCAN  Completed  . Hepatitis C Screening  Completed    Lab Results  Component Value Date   WBC 8.5 07/13/2016   HGB 11.2 (L) 07/13/2016   HCT 35.1 (L) 07/13/2016   PLT 287.0 07/13/2016   GLUCOSE 128 (H) 06/08/2016   CHOL 232 (H) 06/08/2016   TRIG 84.0 06/08/2016   HDL 69.80  06/08/2016   LDLDIRECT 146.5 08/11/2013   LDLCALC 145 (H) 06/08/2016   ALT 14 06/08/2016   AST 18 06/08/2016   NA 142 06/08/2016   K 4.4 06/08/2016   CL 105 06/08/2016   CREATININE 1.01 06/08/2016   BUN 13 06/08/2016   CO2 30 06/08/2016   TSH 1.36 06/08/2016   HGBA1C 6.7 10/30/2016   MICROALBUR <0.7  07/11/2015    Lab Results  Component Value Date   TSH 1.36 06/08/2016   Lab Results  Component Value Date   WBC 8.5 07/13/2016   HGB 11.2 (L) 07/13/2016   HCT 35.1 (L) 07/13/2016   MCV 75.6 (L) 07/13/2016   PLT 287.0 07/13/2016   Lab Results  Component Value Date   NA 142 06/08/2016   K 4.4 06/08/2016   CO2 30 06/08/2016   GLUCOSE 128 (H) 06/08/2016   BUN 13 06/08/2016   CREATININE 1.01 06/08/2016   BILITOT 0.4 06/08/2016   ALKPHOS 53 06/08/2016   AST 18 06/08/2016   ALT 14 06/08/2016   PROT 7.3 06/08/2016   ALBUMIN 4.1 06/08/2016   CALCIUM 9.0 06/08/2016   GFR 58.14 (L) 06/08/2016   Lab Results  Component Value Date   CHOL 232 (H) 06/08/2016   Lab Results  Component Value Date   HDL 69.80 06/08/2016   Lab Results  Component Value Date   LDLCALC 145 (H) 06/08/2016   Lab Results  Component Value Date   TRIG 84.0 06/08/2016   Lab Results  Component Value Date   CHOLHDL 3 06/08/2016   Lab Results  Component Value Date   HGBA1C 6.7 10/30/2016         Assessment & Plan:   Problem List Items Addressed This Visit    Hyperlipidemia    Encouraged heart healthy diet, increase exercise, avoid trans fats, consider a krill oil cap daily      Relevant Orders   Lipid panel   Osteopenia    Encouraged to get adequate exercise, calcium and vitamin d intake      Diabetes type 2, controlled (HCC)    hgba1c acceptable, minimize simple carbs. Increase exercise as tolerated. Following with endocrinology and doing well with lifestyle changes. Follows with podiatry, Dr Doyle Askew, and follows with Dr Zigmund Daniel, opthamology will request records from both. Has been released back to optometry, dr Bennie Pierini for ongoing surveillance.       Anemia    Increase leafy greens, consider increased lean red meat and using cast iron cookware. Continue to monitor, report any concerns. Mild now after treatments with iron      Relevant Orders   CBC with Differential/Platelet     Comprehensive metabolic panel   TSH   Preventative health care    Patient encouraged to maintain heart healthy diet, regular exercise, adequate sleep. Consider daily probiotics. Take medications as prescribed. She has an HCP and living will and agrees to get [place] a copy and to make sure her sister has a copy and can get at the original. Labs today      Relevant Orders   CBC with Differential/Platelet   Comprehensive metabolic panel   TSH   Lipid panel    Other Visit Diagnoses    Postmenopausal    -  Primary   Relevant Orders   DG Bone Density   Encounter for Medicare annual wellness exam          I have discontinued Ms. Mcginness's chlorpheniramine and meloxicam. I am also having her start on  Zoster Vac Recomb Adjuvanted. Additionally, I am having her maintain her blood glucose meter kit and supplies, lamoTRIgine, naproxen, Calcium, omega-3 acid ethyl esters, FERROCITE, ONETOUCH VERIO IQ SYSTEM, glucose blood, ONETOUCH DELICA LANCETS FINE, fluticasone, cetirizine, Biotin, and TURMERIC PO. We will continue to administer sodium chloride.  Meds ordered this encounter  Medications  . cetirizine (ZYRTEC) 10 MG tablet    Sig: Take 10 mg by mouth daily.  . Biotin 1 MG CAPS    Sig: Take by mouth.  . TURMERIC PO    Sig: Take by mouth.  . Zoster Vac Recomb Adjuvanted [facility]) injection    Sig: Inject 0.5 mLs into the muscle once.    Dispense:  0.5 mL    Refill:  1    CMA served as scribe during this visit. History, Physical and Plan performed by medical provider. Documentation and orders reviewed and attested to.  Destiny Homans, MD

## 2016-12-21 NOTE — Assessment & Plan Note (Signed)
Encouraged heart healthy diet, increase exercise, avoid trans fats, consider a krill oil cap daily 

## 2016-12-21 NOTE — Assessment & Plan Note (Addendum)
hgba1c acceptable, minimize simple carbs. Increase exercise as tolerated. Following with endocrinology and doing well with lifestyle changes. Follows with podiatry, Dr Doyle Askew, and follows with Dr Zigmund Daniel, opthamology will request records from both. Has been released back to optometry, dr Bennie Pierini for ongoing surveillance.

## 2016-12-21 NOTE — Progress Notes (Signed)
Pre visit review using our clinic review tool, if applicable. No additional management support is needed unless otherwise documented below in the visit note. 

## 2016-12-21 NOTE — Assessment & Plan Note (Addendum)
Patient encouraged to maintain heart healthy diet, regular exercise, adequate sleep. Consider daily probiotics. Take medications as prescribed. She has an HCP and living will and agrees to get [place] a copy and to make sure her sister has a copy and can get at the original. Labs today

## 2016-12-21 NOTE — Patient Instructions (Addendum)
Spoke with you today in regards to [place]. Encouraged to you bring a notarized copy of your Living Will and Healthcare Power of Attorney into Dr. Frederik Pear Office to be scanned into your Chart. Carbohydrate Counting for Diabetes Mellitus, Adult Carbohydrate counting is a method for keeping track of how many carbohydrates you eat. Eating carbohydrates naturally increases the amount of sugar (glucose) in the blood. Counting how many carbohydrates you eat helps keep your blood glucose within normal limits, which helps you manage your diabetes (diabetes mellitus). It is important to know how many carbohydrates you can safely have in each meal. This is different for every person. A diet and nutrition specialist (registered dietitian) can help you make a meal plan and calculate how many carbohydrates you should have at each meal and snack. Carbohydrates are found in the following foods:  Grains, such as breads and cereals.  Dried beans and soy products.  Starchy vegetables, such as potatoes, peas, and corn.  Fruit and fruit juices.  Milk and yogurt.  Sweets and snack foods, such as cake, cookies, candy, chips, and soft drinks. How do I count carbohydrates? There are two ways to count carbohydrates in food. You can use either of the methods or a combination of both. Reading "Nutrition Facts" on packaged food  The "Nutrition Facts" list is included on the labels of almost all packaged foods and beverages in the [place] It includes:  The serving size.  Information about nutrients in each serving, including the grams (g) of carbohydrate per serving. To use the "Nutrition Facts":  Decide how many servings you will have.  Multiply the number of servings by the number of carbohydrates per serving.  The resulting number is the total amount of carbohydrates that you will be having. Learning standard serving sizes of other foods  When you eat foods containing carbohydrates that are not  packaged or do not include "Nutrition Facts" on the label, you need to measure the servings in order to count the amount of carbohydrates:  Measure the foods that you will eat with a food scale or measuring cup, if needed.  Decide how many standard-size servings you will eat.  Multiply the number of servings by 15. Most carbohydrate-rich foods have about 15 g of carbohydrates per serving.  For example, if you eat 8 oz (170 g) of strawberries, you will have eaten 2 servings and 30 g of carbohydrates (2 servings x 15 g = 30 g).  For foods that have more than one food mixed, such as soups and casseroles, you must count the carbohydrates in each food that is included. The following list contains standard serving sizes of common carbohydrate-rich foods. Each of these servings has about 15 g of carbohydrates:   hamburger bun or  English muffin.   oz (15 mL) syrup.   oz (14 g) jelly.  1 slice of bread.  1 six-inch tortilla.  3 oz (85 g) cooked rice or pasta.  4 oz (113 g) cooked dried beans.  4 oz (113 g) starchy vegetable, such as peas, corn, or potatoes.  4 oz (113 g) hot cereal.  4 oz (113 g) mashed potatoes or  of a large baked potato.  4 oz (113 g) canned or frozen fruit.  4 oz (120 mL) fruit juice.  4-6 crackers.  6 chicken nuggets.  6 oz (170 g) unsweetened dry cereal.  6 oz (170 g) plain fat-free yogurt or yogurt sweetened with artificial sweeteners.  8 oz (240 mL) milk.  8 oz (170 g) fresh fruit or one small piece of fruit.  24 oz (680 g) popped popcorn. Example of carbohydrate counting Sample meal   3 oz (85 g) chicken breast.  6 oz (170 g) brown rice.  4 oz (113 g) corn.  8 oz (240 mL) milk.  8 oz (170 g) strawberries with sugar-free whipped topping. Carbohydrate calculation  1. Identify the foods that contain carbohydrates:  Rice.  Corn.  Milk.  Strawberries. 2. Calculate how many servings you have of each food:  2 servings  rice.  1 serving corn.  1 serving milk.  1 serving strawberries. 3. Multiply each number of servings by 15 g:  2 servings rice x 15 g = 30 g.  1 serving corn x 15 g = 15 g.  1 serving milk x 15 g = 15 g.  1 serving strawberries x 15 g = 15 g. 4. Add together all of the amounts to find the total grams of carbohydrates eaten:  30 g + 15 g + 15 g + 15 g = 75 g of carbohydrates total. This information is not intended to replace advice given to you by your health care provider. Make sure you discuss any questions you have with your health care provider. Document Released: 08/24/2005 Document Revised: 03/13/2016 Document Reviewed: 02/05/2016 Elsevier Interactive Patient Education  2017 [employer].

## 2016-12-21 NOTE — Assessment & Plan Note (Addendum)
Increase leafy greens, consider increased lean red meat and using cast iron cookware. Continue to monitor, report any concerns. Mild now after treatments with iron

## 2016-12-21 NOTE — Assessment & Plan Note (Signed)
Encouraged to get adequate exercise, calcium and vitamin d intake 

## 2016-12-22 DIAGNOSIS — H31001 Unspecified chorioretinal scars, right eye: Secondary | ICD-10-CM | POA: Diagnosis not present

## 2016-12-22 DIAGNOSIS — E113291 Type 2 diabetes mellitus with mild nonproliferative diabetic retinopathy without macular edema, right eye: Secondary | ICD-10-CM | POA: Diagnosis not present

## 2016-12-31 DIAGNOSIS — L821 Other seborrheic keratosis: Secondary | ICD-10-CM | POA: Diagnosis not present

## 2016-12-31 DIAGNOSIS — Z85828 Personal history of other malignant neoplasm of skin: Secondary | ICD-10-CM | POA: Diagnosis not present

## 2016-12-31 DIAGNOSIS — L820 Inflamed seborrheic keratosis: Secondary | ICD-10-CM | POA: Diagnosis not present

## 2017-01-27 ENCOUNTER — Ambulatory Visit: Payer: Medicare HMO | Admitting: Internal Medicine

## 2017-01-28 DIAGNOSIS — R69 Illness, unspecified: Secondary | ICD-10-CM | POA: Diagnosis not present

## 2017-02-03 ENCOUNTER — Other Ambulatory Visit: Payer: Self-pay | Admitting: Family Medicine

## 2017-02-03 MED ORDER — FERROUS FUMARATE 324 (106 FE) MG PO TABS: 1.0000 | ORAL_TABLET | Freq: Every day | ORAL | 0 refills | Status: DC

## 2017-03-26 ENCOUNTER — Encounter: Payer: Self-pay | Admitting: Internal Medicine

## 2017-03-26 ENCOUNTER — Ambulatory Visit (INDEPENDENT_AMBULATORY_CARE_PROVIDER_SITE_OTHER): Payer: Medicare HMO | Admitting: Internal Medicine

## 2017-03-26 VITALS — BP 120/82 | HR 60 | Ht 64.5 in | Wt 155.0 lb

## 2017-03-26 DIAGNOSIS — E119 Type 2 diabetes mellitus without complications: Secondary | ICD-10-CM

## 2017-03-26 DIAGNOSIS — D509 Iron deficiency anemia, unspecified: Secondary | ICD-10-CM

## 2017-03-26 LAB — POCT GLYCOSYLATED HEMOGLOBIN (HGB A1C): HEMOGLOBIN A1C: 6.6

## 2017-03-26 NOTE — Addendum Note (Signed)
Addended by: Ena Dawley on: 03/26/2017 09:38 AM   Modules accepted: Orders

## 2017-03-26 NOTE — Patient Instructions (Signed)
Please check sugars 1x a day.  Please return in 3 months with your sugar log.

## 2017-03-26 NOTE — Progress Notes (Signed)
Patient ID: Destiny Romero, female   DOB: 26-Nov-1950, 66 y.o.   MRN: [id number]   HPI: Destiny Romero is a 66 y.o.-year-old female, self-referred for management of DM2, dx in 2014, diet controlled, controlled, without long term complications.  Last hemoglobin A1c was: Lab Results  Component Value Date   HGBA1C 6.7 10/30/2016   HGBA1C 7.0 (H) 06/08/2016   HGBA1C 6.7 (H) 07/11/2015   HGBA1C 6.6 (H) 03/19/2015   HGBA1C 6.5 06/14/2014   HGBA1C 6.6 (H) 08/11/2013   HGBA1C 6.4 04/25/2012   HGBA1C 6.4 06/25/2011   HGBA1C 6.2 01/30/2011   HGBA1C 6.1 08/25/2010   HGBA1C 6.1 01/08/2010   HGBA1C 6.1 07/12/2009   HGBA1C 6.4 01/11/2009   HGBA1C 6.2 (H) 06/27/2008   HGBA1C 6.3 (H) 05/23/2007   HGBA1C 6.5 (H) 01/28/2007   Patient's diabetes is only diet-controlled. She had dizziness from metformin before.  Pt checks her sugars 0-1x a day - no log, no meter: - am: 90-110, more recently 120-140 >> 97, 110, 120-140 (higher if snack at night) - 2h after b'fast: n/c - before lunch: 100 >> ? Cannot remember - 2h after lunch: n/c - before dinner: n/c - 2h after dinner: n/c - bedtime: 110-120 - nighttime: n/c No lows. Lowest sugar was 90 >> 97; ? hypoglycemia awareness Highest sugar was 140 >> 180 (pizza) x1, 150.  Glucometer: Accu-Chek Aviva plus  She cut out cheese. Pt's meals are: - Breakfast: egg, toast, bacon; egg, fruit, toast; occasionally pancakes - Lunch: sandwich, fruit, cottage cheese, salad - Dinner: meat, 1-2 veggies - Snacks: mid-am or mid-pm - cheese or fruit, granola  She walks 2-3 times a week: 30-45 minutes at a time + added weight training >> sugars better in am when exercising.  - No CKD, last BUN/creatinine:  Lab Results  Component Value Date   BUN 17 12/21/2016   BUN 13 06/08/2016   CREATININE 1.07 12/21/2016   CREATININE 1.01 06/08/2016   - last set of lipids: Lab Results  Component Value Date   CHOL 217 (H) 12/21/2016   HDL 75.30 12/21/2016   LDLCALC 126  (H) 12/21/2016   LDLDIRECT 146.5 08/11/2013   TRIG 79.0 12/21/2016   CHOLHDL 3 12/21/2016  She is not on a statin. Did not try one.  - last eye exam was in 02/2017. Reportedly No DR. She had a retinal bleed- R eye. Had Laser Sx. She has decreased vision in this eye. - she has tingling in her feet, no numbness  Patient has a history of anemia, on Fe supplements since 2017. Latest Hb: Lab Results  Component Value Date   HGB 12.5 12/21/2016   ROS: Constitutional: no weight gain/no weight loss, + fatigue, no subjective hyperthermia, no subjective hypothermia Eyes: + blurry vision, no xerophthalmia ENT: no sore throat, no nodules palpated in throat, no dysphagia, no odynophagia, no hoarseness Cardiovascular: no CP/no SOB/no palpitations/no leg swelling Respiratory: no cough/no SOB/no wheezing Gastrointestinal: no N/no V/no D/no C/no acid reflux Musculoskeletal: no muscle aches/no joint aches Skin: no rashes, no hair loss Neurological: no tremors/no dizziness  I reviewed pt's medications, allergies, PMH, social hx, family hx, and changes were documented in the history of present illness. Otherwise, unchanged from my initial visit note.  Past Medical History:  Diagnosis Date  . Allergy   . Anal lesion 2007  . Anemia   . Anesthesia complication    Per pt/ sensitive to sedation!  . Central retinal artery occlusion 06/08/2016   limited vision right  eye.  . Cervical dysplasia 04/1991   Dr Rica Koyanagi - CIN I, cone and freeze in 1990s with normal paps since  . Constipation 06/08/2016   moves bowels every 3 rd day   . Decreased visual acuity 06/08/2016   Retinal bleed right eye 2017  . Depression   . H/O measles   . H/O mumps   . History of chicken pox   . Mitral valve prolapse    NO MR; no SBE prophylaxis needed  . PONV (postoperative nausea and vomiting)   . Pre-diabetes    highest A1c 6.4 %  . Preventative health care 12/21/2016  . Squamous cell carcinoma in situ of skin    Dr  [place]  . Vision impairment    limited vision in right eye.   Past Surgical History:  Procedure Laterality Date  . CERVICAL CONE BIOPSY    . COLONOSCOPY  2003  . COLONOSCOPY  04/01/2011   [place] - repeat in 5 years  . COLONOSCOPY W/ POLYPECTOMY  2007   Dr Earlean Shawl  . LAMINECTOMY     1982  . OOPHORECTOMY Left 12/2011   Dr Ubaldo Glassing ; ovarian cyst  . RECTAL SURGERY  05/28/06   pre-canerous lesion removed in 2007; Dr Morton Stall, [facility]  . rt foot morton's surgery    . TUBAL LIGATION     Social History   Social History  . Marital status: Single    Spouse name: N/A  . Number of children: N/A  . Years of education: N/A   Occupational History  . Not on file.   Social History Main Topics  . Smoking status: Never Smoker  . Smokeless tobacco: Never Used  . Alcohol use No     Comment: glass wine once a month  . Drug use: No  . Sexual activity: No   Other Topics Concern  . Not on file   Social History Narrative   Lives alone   No dietary restrictions   Current Outpatient Prescriptions on File Prior to Visit  Medication Sig Dispense Refill  . Biotin 1 MG CAPS Take by mouth.    . blood glucose meter kit and supplies KIT Dispense based on patient and insurance preference. Use up to four times daily as directed. DX: E11.09 1 each 0  . Blood Glucose Monitoring Suppl (ONETOUCH VERIO IQ SYSTEM) w/Device KIT Use to check sugar daily E11.9 1 kit 0  . Calcium 200 MG TABS Take by mouth. Tale [phone] mg daily    . cetirizine (ZYRTEC) 10 MG tablet Take 10 mg by mouth daily.    . Ferrous Fumarate (FERROCITE) 324 (106 Fe) MG TABS tablet Take 1 tablet (106 mg of iron total) by mouth daily. 90 tablet 0  . fluticasone (FLONASE) 50 MCG/ACT nasal spray Place 2 sprays into both nostrils daily. 16 g 6  . glucose blood (ONETOUCH VERIO) test strip Use as instructed to check sugar once daily E11.9 100 each 5  . lamoTRIgine (LAMICTAL) 200 MG tablet Take 200 mg by mouth daily.    . naproxen (NAPROSYN) 500 MG  tablet Take 500 mg by mouth as needed.    Marland Kitchen omega-3 acid ethyl esters (LOVAZA) 1 g capsule Take by mouth daily.    [name] DELICA LANCETS FINE MISC Use to check sugar once daily E11.9 100 each 5  . TURMERIC PO Take by mouth.     Current Facility-Administered Medications on File Prior to Visit  Medication Dose Route Frequency Provider Last Rate Last Dose  .  0.9 %  sodium chloride infusion  500 mL Intravenous Continuous Milus Banister, MD       Allergies  Allergen Reactions  . Cortisone Palpitations and Other (See Comments)    hyperactivity  . Pseudoephedrine     tachycardia  . Metformin And Related     04/15/15 caused dizziness   Family History  Problem Relation Age of Onset  . Diabetes Mother   . Dementia Mother   . Prostate cancer Father   . COPD Father   . Heart disease Father        CAD - used nitroglycerin tablets  . Diabetes Sister        obese  . Thyroid cancer Sister   . Diabetes Brother        not obese  . Lymphoma Brother        NHL  . Cancer Brother        non Hodgkin's Lymphoma  . Diabetes Maternal Grandmother        IDDM  . Diabetes Paternal Grandmother        IDDM  . Heart disease Paternal Grandmother   . Diabetes Maternal Aunt        X5 ; all IDDM  . Heart disease Maternal Aunt        "enlarged heart"  . Heart disease Maternal Uncle        "enlarged heart"  . Heart disease Paternal Grandfather        CAD - used nitroglycerin tablets  . Diabetes Brother   . Colon cancer Neg Hx   . Esophageal cancer Neg Hx   . Stomach cancer Neg Hx   . Stroke Neg Hx     PE: BP 120/82 (BP Location: Left Arm, Patient Position: Sitting)   Pulse 60   Ht 5' 4.5" (1.638 m)   Wt 155 lb (70.3 kg)   LMP 04/07/2001 (Approximate)   SpO2 96%   BMI 26.19 kg/m   Wt Readings from Last 3 Encounters:  03/26/17 155 lb (70.3 kg)  12/21/16 157 lb 6.4 oz (71.4 kg)  11/20/16 161 lb (73 kg)   Constitutional: slightly overweight, in NAD Eyes: PERRLA, EOMI, no  exophthalmos ENT: moist mucous membranes, no thyromegaly, no cervical lymphadenopathy Cardiovascular: RRR, No MRG Respiratory: CTA B Gastrointestinal: abdomen soft, NT, ND, BS+ Musculoskeletal: no deformities, strength intact in all 4 Skin: moist, warm, no rashes Neurological: no tremor with outstretched hands, DTR normal in all 4  ASSESSMENT: 1. DM2, diet controlled, controlled, without long term complications  2. Anemia  PLAN:  1. Patient with new dx of diabetes, diet-controlled. She has an extensive family history of diabetes was closely followed for this over the years, initially being diagnosed with prediabetes, but then her HbA1c increased in the low diabetic range in 2014. At last visits, we discussed about improving her diet and exercise. She continues to work on this and her HbA1c today is slightly better, at 6.6%. - No medications needed for now, but if we need to do this in the future, we can try metformin Her, since the regular metformin caused her dizziness. - I suggested to:  Patient Instructions  Please check sugars 1x a day  Please return in 4 months with your sugar log.   - today, HbA1c is 6.6% (slightly better) - continue checking sugars at different times of the day - check 1x a day, rotating checks - advised for yearly eye exams >> she is UTD - Return to clinic in  3 mo with sugar log   2. Anemia - Reviewed latest hemoglobin level, since this can impact the HbA1c result. Her hemoglobin was normal, meaning that her HbA1c result is accurate. She continues on iron po. - I reviewed the images and the results of her last 2 colonoscopies available in Epic and she only had internal hemorrhoids in 2012, no clear sources for bleeding in 2018.  Philemon Kingdom, MD PhD [facility] Endocr[facility]

## 2017-03-30 ENCOUNTER — Other Ambulatory Visit (HOSPITAL_BASED_OUTPATIENT_CLINIC_OR_DEPARTMENT_OTHER): Payer: Medicare HMO

## 2017-04-26 ENCOUNTER — Ambulatory Visit (HOSPITAL_BASED_OUTPATIENT_CLINIC_OR_DEPARTMENT_OTHER)
Admission: RE | Admit: 2017-04-26 | Discharge: 2017-04-26 | Disposition: A | Discharge status: Home / Self Care | Payer: Medicare HMO | Source: Ambulatory Visit | Attending: Family Medicine | Admitting: Family Medicine

## 2017-04-26 DIAGNOSIS — M85851 Other specified disorders of bone density and structure, right thigh: Secondary | ICD-10-CM | POA: Diagnosis not present

## 2017-04-26 DIAGNOSIS — Z780 Asymptomatic menopausal state: Secondary | ICD-10-CM | POA: Diagnosis not present

## 2017-04-26 DIAGNOSIS — R69 Illness, unspecified: Secondary | ICD-10-CM | POA: Diagnosis not present

## 2017-05-04 ENCOUNTER — Other Ambulatory Visit: Payer: Self-pay | Admitting: Family Medicine

## 2017-05-13 DIAGNOSIS — R69 Illness, unspecified: Secondary | ICD-10-CM | POA: Diagnosis not present

## 2017-06-15 DIAGNOSIS — R69 Illness, unspecified: Secondary | ICD-10-CM | POA: Diagnosis not present

## 2017-06-24 ENCOUNTER — Ambulatory Visit: Payer: Medicare HMO | Admitting: Family Medicine

## 2017-06-25 ENCOUNTER — Encounter: Payer: Self-pay | Admitting: Family Medicine

## 2017-06-25 ENCOUNTER — Ambulatory Visit (INDEPENDENT_AMBULATORY_CARE_PROVIDER_SITE_OTHER): Payer: Medicare HMO | Admitting: Family Medicine

## 2017-06-25 VITALS — BP 110/68 | HR 62 | Temp 97.0°F | Resp 18 | Wt 163.2 lb

## 2017-06-25 DIAGNOSIS — R350 Frequency of micturition: Secondary | ICD-10-CM | POA: Diagnosis not present

## 2017-06-25 DIAGNOSIS — K5900 Constipation, unspecified: Secondary | ICD-10-CM

## 2017-06-25 DIAGNOSIS — W19XXXA Unspecified fall, initial encounter: Secondary | ICD-10-CM | POA: Diagnosis not present

## 2017-06-25 DIAGNOSIS — E785 Hyperlipidemia, unspecified: Secondary | ICD-10-CM | POA: Diagnosis not present

## 2017-06-25 DIAGNOSIS — E119 Type 2 diabetes mellitus without complications: Secondary | ICD-10-CM | POA: Diagnosis not present

## 2017-06-25 DIAGNOSIS — R5383 Other fatigue: Secondary | ICD-10-CM

## 2017-06-25 LAB — CBC
HCT: 39.8 % (ref 36.0–46.0)
Hemoglobin: 13.1 g/dL (ref 12.0–15.0)
MCHC: 32.9 g/dL (ref 30.0–36.0)
MCV: 91.3 fl (ref 78.0–100.0)
Platelets: 274 10*3/uL (ref 150.0–400.0)
RBC: 4.36 Mil/uL (ref 3.87–5.11)
RDW: 14.4 % (ref 11.5–15.5)
WBC: 7.4 10*3/uL (ref 4.0–10.5)

## 2017-06-25 LAB — COMPREHENSIVE METABOLIC PANEL
ALT: 21 U/L (ref 0–35)
AST: 21 U/L (ref 0–37)
Albumin: 4.4 g/dL (ref 3.5–5.2)
Alkaline Phosphatase: 45 U/L (ref 39–117)
BUN: 13 mg/dL (ref 6–23)
CHLORIDE: 103 meq/L (ref 96–112)
CO2: 32 meq/L (ref 19–32)
CREATININE: 0.98 mg/dL (ref 0.40–1.20)
Calcium: 9.9 mg/dL (ref 8.4–10.5)
GFR: 60.01 mL/min (ref >60.00)
Glucose, Bld: 110 mg/dL — ABNORMAL HIGH (ref 70–99)
Potassium: 5.1 mEq/L (ref 3.5–5.1)
SODIUM: 142 meq/L (ref 135–145)
Total Bilirubin: 0.6 mg/dL (ref 0.2–1.2)
Total Protein: 7.1 g/dL (ref 6.0–8.3)

## 2017-06-25 LAB — LIPID PANEL
CHOL/HDL RATIO: 3
Cholesterol: 228 mg/dL — ABNORMAL HIGH (ref 0–200)
HDL: 82.9 mg/dL (ref >39.00)
LDL CALC: 123 mg/dL — AB (ref 0–99)
NonHDL: 145.36
TRIGLYCERIDES: 112 mg/dL (ref 0.0–149.0)
VLDL: 22.4 mg/dL (ref 0.0–40.0)

## 2017-06-25 LAB — URINALYSIS
Bilirubin Urine: NEGATIVE
Hgb urine dipstick: NEGATIVE
KETONES UR: NEGATIVE
Leukocytes, UA: NEGATIVE
Nitrite: NEGATIVE
SPECIFIC GRAVITY, URINE: 1.01 (ref 1.000–1.030)
Total Protein, Urine: NEGATIVE
URINE GLUCOSE: NEGATIVE
UROBILINOGEN UA: 0.2 (ref 0.0–1.0)
pH: 7 (ref 5.0–8.0)

## 2017-06-25 LAB — TSH: TSH: 1.33 u[IU]/mL (ref 0.35–4.50)

## 2017-06-25 MED ORDER — FERROUS FUMARATE 324 (106 FE) MG PO TABS: 1.0000 | ORAL_TABLET | Freq: Every day | ORAL | 0 refills | Status: DC

## 2017-06-25 NOTE — Patient Instructions (Addendum)
Call insurance and ask if they pay PT and what the copays are. Is it more cost effective to use the [employer] or a private site such as [employer], quantity referral if falls continue call for referral  64 oz of clear fluids daily  Carbohydrate counting is a method for keeping track of how many carbohydrates you eat. Eating carbohydrates naturally increases the amount of sugar (glucose) in the blood. Counting how many carbohydrates you eat helps keep your blood glucose within normal limits, which helps you manage your diabetes (diabetes mellitus). It is important to know how many carbohydrates you can safely have in each meal. This is different for every person. A diet and nutrition specialist (registered dietitian) can help you make a meal plan and calculate how many carbohydrates you should have at each meal and snack. Carbohydrates are found in the following foods:  Grains, such as breads and cereals.  Dried beans and soy products.  Starchy vegetables, such as potatoes, peas, and corn.  Fruit and fruit juices.  Milk and yogurt.  Sweets and snack foods, such as cake, cookies, candy, chips, and soft drinks.  How do I count carbohydrates? There are two ways to count carbohydrates in food. You can use either of the methods or a combination of both. Reading "Nutrition Facts" on packaged food The "Nutrition Facts" list is included on the labels of almost all packaged foods and beverages in the [place] It includes:  The serving size.  Information about nutrients in each serving, including the grams (g) of carbohydrate per serving.  To use the "Nutrition Facts":  Decide how many servings you will have.  Multiply the number of servings by the number of carbohydrates per serving.  The resulting number is the total amount of carbohydrates that you will be having.  Learning standard serving sizes of other foods When you eat foods containing carbohydrates that are not  packaged or do not include "Nutrition Facts" on the label, you need to measure the servings in order to count the amount of carbohydrates:  Measure the foods that you will eat with a food scale or measuring cup, if needed.  Decide how many standard-size servings you will eat.  Multiply the number of servings by 15. Most carbohydrate-rich foods have about 15 g of carbohydrates per serving. ? For example, if you eat 8 oz (170 g) of strawberries, you will have eaten 2 servings and 30 g of carbohydrates (2 servings x 15 g = 30 g).  For foods that have more than one food mixed, such as soups and casseroles, you must count the carbohydrates in each food that is included.  The following list contains standard serving sizes of common carbohydrate-rich foods. Each of these servings has about 15 g of carbohydrates:   hamburger bun or  English muffin.   oz (15 mL) syrup.   oz (14 g) jelly.  1 slice of bread.  1 six-inch tortilla.  3 oz (85 g) cooked rice or pasta.  4 oz (113 g) cooked dried beans.  4 oz (113 g) starchy vegetable, such as peas, corn, or potatoes.  4 oz (113 g) hot cereal.  4 oz (113 g) mashed potatoes or  of a large baked potato.  4 oz (113 g) canned or frozen fruit.  4 oz (120 mL) fruit juice.  4-6 crackers.  6 chicken nuggets.  6 oz (170 g) unsweetened dry cereal.  6 oz (170 g) plain fat-free yogurt or yogurt sweetened  with artificial sweeteners.  8 oz (240 mL) milk.  8 oz (170 g) fresh fruit or one small piece of fruit.  24 oz (680 g) popped popcorn.  Example of carbohydrate counting Sample meal  3 oz (85 g) chicken breast.  6 oz (170 g) brown rice.  4 oz (113 g) corn.  8 oz (240 mL) milk.  8 oz (170 g) strawberries with sugar-free whipped topping. Carbohydrate calculation 1. Identify the foods that contain carbohydrates: ? Rice. ? Corn. ? Milk. ? Strawberries. 2. Calculate how many servings you have of each food: ? 2 servings  rice. ? 1 serving corn. ? 1 serving milk. ? 1 serving strawberries. 3. Multiply each number of servings by 15 g: ? 2 servings rice x 15 g = 30 g. ? 1 serving corn x 15 g = 15 g. ? 1 serving milk x 15 g = 15 g. ? 1 serving strawberries x 15 g = 15 g. 4. Add together all of the amounts to find the total grams of carbohydrates eaten: ? 30 g + 15 g + 15 g + 15 g = 75 g of carbohydrates total. This information is not intended to replace advice given to you by your health care provider. Make sure you discuss any questions you have with your health care provider. Document Released: 08/24/2005 Document Revised: 03/13/2016 Document Reviewed: 02/05/2016 Elsevier Interactive Patient Education  [employer].

## 2017-06-25 NOTE — Assessment & Plan Note (Signed)
Follows with Dr Shellia Cleverly, doin well

## 2017-06-25 NOTE — Assessment & Plan Note (Signed)
Increase leafy greens, consider increased lean red meat and using cast iron cookware. Continue to monitor, report any concerns 

## 2017-06-25 NOTE — Assessment & Plan Note (Signed)
Encouraged heart healthy diet, increase exercise, avoid trans fats, consider a krill oil cap daily 

## 2017-06-25 NOTE — Progress Notes (Signed)
Subjective:  I acted as a Education administrator for Dr. Charlett Blake. Destiny Romero, [place]  Patient ID: Destiny Romero, female    DOB: 03-05-1951, 66 y.o.   MRN: [id number]  No chief complaint on file.   HPI  Patient is in today for 6 month follow up and is struggling with fatigue and stress. She has recently moved and this has been very stressed. She has had 3 falls, once off her bike another time tripping on a mat with a raised edge and once rushing. No syncope or neurologic concerns. No recent febrile illness or hospitalizatins. She is following with endorcirnology and she notes her blood sugars have been in the 110s to 120s largely. Still notes polyuria but no polydipsia. Denies CP/palp/SOB/HA/congestion/fevers/GI c/o. Taking meds as prescribed  Patient Care Team: Mosie Lukes, MD as PCP - General (Family Medicine) Gardiner Barefoot, DPM as Consulting Physician (Podiatry) Philemon Kingdom, MD as Consulting Physician (Internal Medicine) Milus Banister, MD as Consulting Physician (Gastroenterology) Kem Boroughs, [place] as Nurse Practitioner (Family Medicine) Eliseo Gum, MD as Consulting Physician (Psychiatry) Rolm Bookbinder, MD as Consulting Physician (Dermatology) Hayden Pedro, MD as Consulting Physician (Ophthalmology)   Past Medical History:  Diagnosis Date  . Allergy   . Anal lesion 2007  . Anemia   . Anesthesia complication    Per pt/ sensitive to sedation!  . Central retinal artery occlusion 06/08/2016   limited vision right eye.  Marland Kitchen Cervical dysplasia 04/1991   Dr Rica Koyanagi - CIN I, cone and freeze in 1990s with normal paps since  . Constipation 06/08/2016   moves bowels every 3 rd day   . Decreased visual acuity 06/08/2016   Retinal bleed right eye 2017  . Depression   . Fatigue 06/27/2017  . H/O measles   . H/O mumps   . History of chicken pox   . Mitral valve prolapse    NO MR; no SBE prophylaxis needed  . PONV (postoperative nausea and vomiting)   . Pre-diabetes    highest  A1c 6.4 %  . Preventative health care 12/21/2016  . Squamous cell carcinoma in situ of skin    Dr Syble Creek  . Vision impairment    limited vision in right eye.    Past Surgical History:  Procedure Laterality Date  . CERVICAL CONE BIOPSY    . COLONOSCOPY  2003  . COLONOSCOPY  04/01/2011   [place] - repeat in 5 years  . COLONOSCOPY W/ POLYPECTOMY  2007   Dr Earlean Shawl  . LAMINECTOMY     1982  . OOPHORECTOMY Left 12/2011   Dr Ubaldo Glassing ; ovarian cyst  . RECTAL SURGERY  05/28/06   pre-canerous lesion removed in 2007; Dr Morton Stall, [facility]  . rt foot morton's surgery    . TUBAL LIGATION      Family History  Problem Relation Age of Onset  . Diabetes Mother   . Dementia Mother   . Prostate cancer Father   . COPD Father   . Heart disease Father        CAD - used nitroglycerin tablets  . Diabetes Sister        obese  . Thyroid cancer Sister   . Diabetes Brother        not obese  . Lymphoma Brother        NHL  . Cancer Brother        non Hodgkin's Lymphoma  . Diabetes Maternal Grandmother        IDDM  .  Diabetes Paternal Grandmother        IDDM  . Heart disease Paternal Grandmother   . Diabetes Maternal Aunt        X5 ; all IDDM  . Heart disease Maternal Aunt        "enlarged heart"  . Heart disease Maternal Uncle        "enlarged heart"  . Heart disease Paternal Grandfather        CAD - used nitroglycerin tablets  . Diabetes Brother   . Colon cancer Neg Hx   . Esophageal cancer Neg Hx   . Stomach cancer Neg Hx   . Stroke Neg Hx     Social History   Social History  . Marital status: Single    Spouse name: N/A  . Number of children: N/A  . Years of education: N/A   Occupational History  . Not on file.   Social History Main Topics  . Smoking status: Never Smoker  . Smokeless tobacco: Never Used  . Alcohol use No     Comment: glass wine once a month  . Drug use: No  . Sexual activity: No   Other Topics Concern  . Not on file   Social History Narrative   Lives  alone   No dietary restrictions    Outpatient Medications Prior to Visit  Medication Sig Dispense Refill  . Biotin 1 MG CAPS Take by mouth.    . blood glucose meter kit and supplies KIT Dispense based on patient and insurance preference. Use up to four times daily as directed. DX: E11.09 1 each 0  . Blood Glucose Monitoring Suppl (ONETOUCH VERIO IQ SYSTEM) w/Device KIT Use to check sugar daily E11.9 1 kit 0  . Calcium 200 MG TABS Take by mouth. Tale [phone] mg daily    . cetirizine (ZYRTEC) 10 MG tablet Take 10 mg by mouth daily.    . fluticasone (FLONASE) 50 MCG/ACT nasal spray Place 2 sprays into both nostrils daily. 16 g 6  . glucose blood (ONETOUCH VERIO) test strip Use as instructed to check sugar once daily E11.9 100 each 5  . lamoTRIgine (LAMICTAL) 200 MG tablet Take 200 mg by mouth daily.    . naproxen (NAPROSYN) 500 MG tablet Take 500 mg by mouth as needed.    Marland Kitchen omega-3 acid ethyl esters (LOVAZA) 1 g capsule Take by mouth daily.    [name] DELICA LANCETS FINE MISC Use to check sugar once daily E11.9 100 each 5  . TURMERIC PO Take by mouth.    . Ferrous Fumarate (FERROCITE) 324 (106 Fe) MG TABS tablet Take 1 tablet (106 mg of iron total) by mouth daily. 90 tablet 0  . 0.9 %  sodium chloride infusion      No facility-administered medications prior to visit.     Allergies  Allergen Reactions  . Cortisone Palpitations and Other (See Comments)    hyperactivity  . Pseudoephedrine     tachycardia  . Metformin And Related     04/15/15 caused dizziness    Review of Systems  Constitutional: Positive for malaise/fatigue. Negative for fever.  HENT: Negative for congestion.   Eyes: Negative for blurred vision.  Respiratory: Negative for shortness of breath.   Cardiovascular: Negative for chest pain, palpitations and leg swelling.  Gastrointestinal: Negative for abdominal pain, blood in stool and nausea.  Genitourinary: Negative for dysuria and frequency.  Musculoskeletal:  Positive for falls and joint pain. Negative for back pain.  Skin: Negative  for rash.  Neurological: Negative for dizziness, loss of consciousness and headaches.  Endo/Heme/Allergies: Negative for environmental allergies.  Psychiatric/Behavioral: Negative for depression. The patient is nervous/anxious.        Objective:    Physical Exam  Constitutional: She is oriented to person, place, and time. She appears well-developed and well-nourished. No distress.  HENT:  Head: Normocephalic and atraumatic.  Nose: Nose normal.  Eyes: Right eye exhibits no discharge. Left eye exhibits no discharge.  Neck: Normal range of motion. Neck supple.  Cardiovascular: Normal rate and regular rhythm.   No murmur heard. Pulmonary/Chest: Effort normal and breath sounds normal.  Abdominal: Soft. Bowel sounds are normal. There is no tenderness.  Musculoskeletal: She exhibits no edema.  Neurological: She is alert and oriented to person, place, and time.  Skin: Skin is warm and dry.  Psychiatric: She has a normal mood and affect.  Nursing note and vitals reviewed.   BP 110/68 (BP Location: Left Arm, Patient Position: Sitting, Cuff Size: Normal)   Pulse 62   Temp (!) 97 F (36.1 C) (Oral)   Resp 18   Wt 163 lb 3.2 oz (74 kg)   LMP 04/07/2001 (Approximate)   SpO2 97%   BMI 27.58 kg/m  Wt Readings from Last 3 Encounters:  06/25/17 163 lb 3.2 oz (74 kg)  03/26/17 155 lb (70.3 kg)  12/21/16 157 lb 6.4 oz (71.4 kg)   BP Readings from Last 3 Encounters:  06/25/17 110/68  03/26/17 120/82  12/21/16 106/67     Immunization History  Administered Date(s) Administered  . Hepatitis B 01/27/2013  . Influenza Split 06/28/2012  . Influenza, High Dose Seasonal PF 10/05/2016, 06/15/2017  . Influenza,inj,Quad PF,6+ Mos 07/25/2013, 06/14/2014, 07/11/2015  . Tdap 02/03/2013  . Zoster 04/22/2010  . Zoster Recombinat (Shingrix) 12/29/2016    Health Maintenance  Topic Date Due  . PNA vac Low Risk Adult (1  of 2 - PCV13) 08/09/2014  . OPHTHALMOLOGY EXAM  09/15/2015  . URINE MICROALBUMIN  07/10/2016  . MAMMOGRAM  07/20/2017  . HEMOGLOBIN A1C  09/26/2017  . FOOT EXAM  10/08/2017  . COLONOSCOPY  09/15/2021  . TETANUS/TDAP  02/04/2023  . INFLUENZA VACCINE  Completed  . DEXA SCAN  Completed  . Hepatitis C Screening  Completed    Lab Results  Component Value Date   WBC 7.4 06/25/2017   HGB 13.1 06/25/2017   HCT 39.8 06/25/2017   PLT 274.0 06/25/2017   GLUCOSE 110 (H) 06/25/2017   CHOL 228 (H) 06/25/2017   TRIG 112.0 06/25/2017   HDL 82.90 06/25/2017   LDLDIRECT 146.5 08/11/2013   LDLCALC 123 (H) 06/25/2017   ALT 21 06/25/2017   AST 21 06/25/2017   NA 142 06/25/2017   K 5.1 06/25/2017   CL 103 06/25/2017   CREATININE 0.98 06/25/2017   BUN 13 06/25/2017   CO2 32 06/25/2017   TSH 1.33 06/25/2017   HGBA1C 6.6 03/26/2017   MICROALBUR <0.7 07/11/2015    Lab Results  Component Value Date   TSH 1.33 06/25/2017   Lab Results  Component Value Date   WBC 7.4 06/25/2017   HGB 13.1 06/25/2017   HCT 39.8 06/25/2017   MCV 91.3 06/25/2017   PLT 274.0 06/25/2017   Lab Results  Component Value Date   NA 142 06/25/2017   K 5.1 06/25/2017   CO2 32 06/25/2017   GLUCOSE 110 (H) 06/25/2017   BUN 13 06/25/2017   CREATININE 0.98 06/25/2017   BILITOT 0.6 06/25/2017  ALKPHOS 45 06/25/2017   AST 21 06/25/2017   ALT 21 06/25/2017   PROT 7.1 06/25/2017   ALBUMIN 4.4 06/25/2017   CALCIUM 9.9 06/25/2017   GFR 60.01 06/25/2017   Lab Results  Component Value Date   CHOL 228 (H) 06/25/2017   Lab Results  Component Value Date   HDL 82.90 06/25/2017   Lab Results  Component Value Date   LDLCALC 123 (H) 06/25/2017   Lab Results  Component Value Date   TRIG 112.0 06/25/2017   Lab Results  Component Value Date   CHOLHDL 3 06/25/2017   Lab Results  Component Value Date   HGBA1C 6.6 03/26/2017         Assessment & Plan:   Problem List Items Addressed This Visit     Hyperlipidemia    Encouraged heart healthy diet, increase exercise, avoid trans fats, consider a krill oil cap daily      Diabetes type 2, controlled ([place])    Follows with Dr Shellia Cleverly, doin well      Relevant Orders   Comprehensive metabolic panel (Completed)   Lipid panel (Completed)   TSH (Completed)   Constipation    Encouraged increased hydration and fiber in diet. Daily probiotics. If bowels not moving can use MOM 2 tbls po in 4 oz of warm prune juice by mouth every 2-3 days. If no results then repeat in 4 hours with  Dulcolax suppository pr, may repeat again in 4 more hours as needed. Seek care if symptoms worsen. Consider daily Miralax and/or Dulcolax if symptoms persist.       Urinary frequency - Primary    Urine culture negative.       Relevant Orders   Urinalysis (Completed)   Urine Culture (Completed)   Fall    She agrees to consider a referral to PT if it happens again. Encouraged to stay as active as tolerated.       Relevant Orders   CBC (Completed)   Comprehensive metabolic panel (Completed)   TSH (Completed)   Fatigue    Likely mutifactorial. No obvious source found with lab work. Encouraged adqeuate hydration and protein intake. 8 hours of sleep and adequate activity. She acknowledges she has been very stressed with moving and has not been exercising as much.          I am having Ms. Mooneyham maintain her blood glucose meter kit and supplies, lamoTRIgine, naproxen, Calcium, omega-3 acid ethyl esters, ONETOUCH VERIO IQ SYSTEM, glucose blood, ONETOUCH DELICA LANCETS FINE, fluticasone, cetirizine, Biotin, TURMERIC PO, and Ferrous Fumarate. We will stop administering sodium chloride.  Meds ordered this encounter  Medications  . Ferrous Fumarate (FERROCITE) 324 (106 Fe) MG TABS tablet    Sig: Take 1 tablet (106 mg of iron total) by mouth daily.    Dispense:  90 tablet    Refill:  0    CMA served as scribe during this visit. History, Physical and Plan performed  by medical provider. Documentation and orders reviewed and attested to.  Penni Homans, MD

## 2017-06-27 ENCOUNTER — Encounter: Payer: Self-pay | Admitting: Family Medicine

## 2017-06-27 DIAGNOSIS — R350 Frequency of micturition: Secondary | ICD-10-CM | POA: Insufficient documentation

## 2017-06-27 DIAGNOSIS — W19XXXA Unspecified fall, initial encounter: Secondary | ICD-10-CM | POA: Insufficient documentation

## 2017-06-27 DIAGNOSIS — R5383 Other fatigue: Secondary | ICD-10-CM | POA: Insufficient documentation

## 2017-06-27 HISTORY — DX: Other fatigue: R53.83

## 2017-06-27 LAB — URINE CULTURE
MICRO NUMBER:: 81171506
Result:: NO GROWTH
SPECIMEN QUALITY:: ADEQUATE

## 2017-06-27 NOTE — Assessment & Plan Note (Signed)
Likely mutifactorial. No obvious source found with lab work. Encouraged adqeuate hydration and protein intake. 8 hours of sleep and adequate activity. She acknowledges she has been very stressed with moving and has not been exercising as much.

## 2017-06-27 NOTE — Assessment & Plan Note (Signed)
She does not believe this is causing her fatigue. toleraing Lamictal. No new meds.

## 2017-06-27 NOTE — Assessment & Plan Note (Addendum)
She agrees to consider a referral to PT if it happens again. Encouraged to stay as active as tolerated.

## 2017-06-27 NOTE — Assessment & Plan Note (Signed)
Urine culture negative.

## 2017-06-27 NOTE — Assessment & Plan Note (Signed)
Encouraged increased hydration and fiber in diet. Daily probiotics. If bowels not moving can use MOM 2 tbls po in 4 oz of warm prune juice by mouth every 2-3 days. If no results then repeat in 4 hours with  Dulcolax suppository pr, may repeat again in 4 more hours as needed. Seek care if symptoms worsen. Consider daily Miralax and/or Dulcolax if symptoms persist.  

## 2017-07-05 ENCOUNTER — Encounter: Payer: Self-pay | Admitting: Obstetrics and Gynecology

## 2017-07-05 ENCOUNTER — Ambulatory Visit: Payer: Medicare HMO | Admitting: Nurse Practitioner

## 2017-07-05 ENCOUNTER — Ambulatory Visit (INDEPENDENT_AMBULATORY_CARE_PROVIDER_SITE_OTHER): Payer: Medicare HMO | Admitting: Obstetrics and Gynecology

## 2017-07-05 VITALS — BP 118/60 | HR 64 | Resp 14 | Ht 63.5 in | Wt 161.0 lb

## 2017-07-05 DIAGNOSIS — Z01419 Encounter for gynecological examination (general) (routine) without abnormal findings: Secondary | ICD-10-CM | POA: Diagnosis not present

## 2017-07-05 DIAGNOSIS — N6459 Other signs and symptoms in breast: Secondary | ICD-10-CM | POA: Diagnosis not present

## 2017-07-05 NOTE — Progress Notes (Signed)
Patient scheduled while in office. Spoke with Faith at [place] [facility]. Patient scheduled previously for screening MMG on 11/15, this has been cancelled. Patient is aware. Order for bilateral diagnostic MMG with right breast US ordered for right breast nipple inversion. Scheduled for 07/13/17 at 8:15am. Patient is agreeable to date and time.   Signed order faxed to [facility].

## 2017-07-05 NOTE — Patient Instructions (Signed)

## 2017-07-05 NOTE — Progress Notes (Addendum)
66 y.o. G0P0000 SingleCaucasianF here for annual exam.  No vaginal bleeding. Not sexually active.  HgbA1C was 6.6 in 7/18 She has a h/o treatment of an ano-rectal neoplasm in 2007. When I asked her about f/u she states she was told to just keep doing her colonoscopies.     Patient's last menstrual period was 04/07/2001 (approximate).          Sexually active: No.  The current method of family planning is post menopausal status.    Exercising: Yes.    walking/ weights Smoker:  no  Health Maintenance: Pap:  07-01-16 WNL  History of abnormal Pap:  Yes years ago cone biopsy  MMG:  11-13-17WNL  Colonoscopy: 09-14-16 WNL repeat in 5 yrs BMD:   04-26-17 osteopenia FRAX 17% and 2.5% TDaP:  02-03-13 Gardasil: N/A   reports that she has never smoked. She has never used smokeless tobacco. She reports that she drinks alcohol. She reports that she does not use drugs.Just occasional ETOH. Works part time in an office.   Past Medical History:  Diagnosis Date  . Allergy   . Anal lesion 2007  . Anemia   . Anesthesia complication    Per pt/ sensitive to sedation!  . Central retinal artery occlusion 06/08/2016   limited vision right eye.  Marland Kitchen Cervical dysplasia 04/1991   Dr Rica Koyanagi - CIN I, cone and freeze in 1990s with normal paps since  . Constipation 06/08/2016   moves bowels every 3 rd day   . Decreased visual acuity 06/08/2016   Retinal bleed right eye 2017  . Depression   . Fatigue 06/27/2017  . H/O measles   . H/O mumps   . History of chicken pox   . Mitral valve prolapse    NO MR; no SBE prophylaxis needed  . PONV (postoperative nausea and vomiting)   . Pre-diabetes    highest A1c 6.4 %  . Preventative health care 12/21/2016  . Squamous cell carcinoma in situ of skin    Dr Syble Creek  . Vision impairment    limited vision in right eye.    Past Surgical History:  Procedure Laterality Date  . CERVICAL CONE BIOPSY    . COLONOSCOPY  2003  . COLONOSCOPY  04/01/2011   [place] - repeat in  5 years  . COLONOSCOPY W/ POLYPECTOMY  2007   Dr Earlean Shawl  . LAMINECTOMY     1982  . OOPHORECTOMY Left 12/2011   Dr Ubaldo Glassing ; ovarian cyst  . RECTAL SURGERY  05/28/06   pre-canerous lesion removed in 2007; Dr Morton Stall, [facility]  . rt foot morton's surgery    . TUBAL LIGATION      Current Outpatient Prescriptions  Medication Sig Dispense Refill  . Biotin 1 MG CAPS Take by mouth.    . blood glucose meter kit and supplies KIT Dispense based on patient and insurance preference. Use up to four times daily as directed. DX: E11.09 1 each 0  . Blood Glucose Monitoring Suppl (ONETOUCH VERIO IQ SYSTEM) w/Device KIT Use to check sugar daily E11.9 1 kit 0  . Calcium 200 MG TABS Take by mouth. Tale [phone] mg daily    . cetirizine (ZYRTEC) 10 MG tablet Take 10 mg by mouth daily.    . Ferrous Fumarate (FERROCITE) 324 (106 Fe) MG TABS tablet Take 1 tablet (106 mg of iron total) by mouth daily. 90 tablet 0  . fluticasone (FLONASE) 50 MCG/ACT nasal spray Place 2 sprays into both nostrils daily. 16 g 6  .  glucose blood (ONETOUCH VERIO) test strip Use as instructed to check sugar once daily E11.9 100 each 5  . lamoTRIgine (LAMICTAL) 200 MG tablet Take 200 mg by mouth daily.    . naproxen (NAPROSYN) 500 MG tablet Take 500 mg by mouth as needed.    Marland Kitchen omega-3 acid ethyl esters (LOVAZA) 1 g capsule Take by mouth daily.    [name] DELICA LANCETS FINE MISC Use to check sugar once daily E11.9 100 each 5  . TURMERIC PO Take by mouth.     No current facility-administered medications for this visit.     Family History  Problem Relation Age of Onset  . Diabetes Mother   . Dementia Mother   . Prostate cancer Father   . COPD Father   . Heart disease Father        CAD - used nitroglycerin tablets  . Diabetes Sister        obese  . Thyroid cancer Sister   . Diabetes Brother        not obese  . Lymphoma Brother        NHL  . Cancer Brother        non Hodgkin's Lymphoma  . Diabetes Maternal Grandmother         IDDM  . Diabetes Paternal Grandmother        IDDM  . Heart disease Paternal Grandmother   . Diabetes Maternal Aunt        X5 ; all IDDM  . Heart disease Maternal Aunt        "enlarged heart"  . Heart disease Maternal Uncle        "enlarged heart"  . Heart disease Paternal Grandfather        CAD - used nitroglycerin tablets  . Diabetes Brother   . Colon cancer Neg Hx   . Esophageal cancer Neg Hx   . Stomach cancer Neg Hx   . Stroke Neg Hx     Review of Systems  Constitutional: Negative.   HENT: Negative.   Eyes: Negative.   Respiratory: Negative.   Cardiovascular: Negative.   Gastrointestinal: Negative.   Endocrine: Negative.   Genitourinary: Negative.   Musculoskeletal: Negative.   Skin: Negative.   Allergic/Immunologic: Negative.   Neurological: Negative.   Psychiatric/Behavioral: Negative.     Exam:   BP 118/60 (BP Location: Right Arm, Patient Position: Sitting, Cuff Size: Normal)   Pulse 64   Resp 14   Ht 5' 3.5" (1.613 m)   Wt 161 lb (73 kg)   LMP 04/07/2001 (Approximate)   BMI 28.07 kg/m   Weight change: '@WEIGHTCHANGE'$ @ Height:   Height: 5' 3.5" (161.3 cm)  Ht Readings from Last 3 Encounters:  07/05/17 5' 3.5" (1.613 m)  03/26/17 5' 4.5" (1.638 m)  12/21/16 '5\' 4"'$  (1.626 m)    General appearance: alert, cooperative and appears stated age Head: Normocephalic, without obvious abnormality, atraumatic Neck: no adenopathy, supple, symmetrical, trachea midline and thyroid normal to inspection and palpation Lungs: clear to auscultation bilaterally Cardiovascular: regular rate and rhythm Breasts: normal appearance, no masses or tenderness, right nipple is inverted, she states this happened a couple of years ago.  Abdomen: soft, non-tender; non distended,  no masses,  no organomegaly Extremities: extremities normal, atraumatic, no cyanosis or edema Skin: Skin color, texture, turgor normal. No rashes or lesions Lymph nodes: Cervical, supraclavicular, and axillary  nodes normal. No abnormal inguinal nodes palpated Neurologic: Grossly normal   Pelvic: External genitalia:  no lesions  Urethra:  normal appearing urethra with no masses, tenderness or lesions              Bartholins and Skenes: normal                 Vagina: normal appearing vagina with normal color and discharge, no lesions              Cervix: no lesions               Bimanual Exam:  Uterus:  normal size, contour, position, consistency, mobility, non-tender              Adnexa: no mass, fullness, tenderness               Rectovaginal: Confirms               Anus:  normal sphincter tone, no lesions  Chaperone was present for exam.  On questioning during the exam, she states that her right nipple has been inverted for a couple of years. Hasn't had diagnostic imaging, has had normal screening mammograms.   A:  Well Woman with normal exam  Right nipple inversion, she states it's been like that for a couple of years  H/O treatment of anal pre-cancer. Patient told she needed to keep up with her colonoscopies. After her exam, found note in chart in regards to the anal lesion being hpv. She likely needs anal paps    P:   No pap this year  Mammogram and ultrasound, needs diagnostic imaging secondary to right nipple inversion.   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

## 2017-07-08 ENCOUNTER — Telehealth: Payer: Self-pay

## 2017-07-08 NOTE — Telephone Encounter (Signed)
My Chart Message  The insurance needs the code for the procedure instead of the diagnosis code. I apologize I didn't give correct info before.   Thank you Destiny Romero   Called patient and she states needs procedure code for her Bil.mammogram and Rt.Br.U/S which is scheduled 07-22-17 at [facility]. Advised patient she needs to call [facility] for this code since they are doing procedure they can give her this number. She thanked me for calling her back.

## 2017-07-09 ENCOUNTER — Telehealth: Payer: Self-pay | Admitting: Obstetrics and Gynecology

## 2017-07-09 NOTE — Telephone Encounter (Signed)
Please let the patient know that I found her op note and pathology report from 2007. She had a high grade anal dysplasia with hpv effect. I do think she should return for an anal pap smear. Let her know that I have reached out to Dr Morton Stall to get his opinion as to how often she should have an anal pap assuming this one is normal.  Please set her up for an appointment.

## 2017-07-09 NOTE — Telephone Encounter (Signed)
Spoke with patient. Advised of message as seen below from [place]. Patient verbalizes understanding. Appointment scheduled for 07/12/2017 at 4 pm with Dr.Jertson. Patient is agreeable to date and time.  Routing to provider for final review. Patient agreeable to disposition. Will close encounter.

## 2017-07-12 ENCOUNTER — Encounter: Payer: Self-pay | Admitting: Obstetrics and Gynecology

## 2017-07-12 ENCOUNTER — Ambulatory Visit (INDEPENDENT_AMBULATORY_CARE_PROVIDER_SITE_OTHER): Payer: Medicare HMO | Admitting: Obstetrics and Gynecology

## 2017-07-12 ENCOUNTER — Other Ambulatory Visit (HOSPITAL_COMMUNITY)
Admission: RE | Admit: 2017-07-12 | Discharge: 2017-07-12 | Disposition: A | Discharge status: Home / Self Care | Payer: Medicare HMO | Source: Ambulatory Visit | Attending: Obstetrics and Gynecology | Admitting: Obstetrics and Gynecology

## 2017-07-12 VITALS — BP 122/70 | HR 60 | Resp 12 | Wt 164.0 lb

## 2017-07-12 DIAGNOSIS — Z8719 Personal history of other diseases of the digestive system: Secondary | ICD-10-CM

## 2017-07-12 DIAGNOSIS — R85618 Other abnormal cytological findings on specimens from anus: Secondary | ICD-10-CM | POA: Diagnosis not present

## 2017-07-12 NOTE — Progress Notes (Signed)
GYNECOLOGY  VISIT   HPI: 66 y.o.   Single  Caucasian  female   G0P0000 with Patient's last menstrual period was 04/07/2001 (approximate).   here for a anal PAP only. She has a h/o anal dysplasia.      GYNECOLOGIC HISTORY: Patient's last menstrual period was 04/07/2001 (approximate). Contraception:postmenopause Menopausal hormone therapy: none         OB History    Gravida Para Term Preterm AB Living   0 0 0 0 0 0   SAB TAB Ectopic Multiple Live Births   0 0 0 0 0         Patient Active Problem List   Diagnosis Date Noted  . Urinary frequency 06/27/2017  . Fall 06/27/2017  . Fatigue 06/27/2017  . Preventative health care 12/21/2016  . Constipation 06/08/2016  . Decreased visual acuity 06/08/2016  . History of chicken pox   . Anemia   . Diabetes type 2, controlled ([place]) 08/11/2013  . Osteopenia 08/19/2012  . NEOPLASM, MALIGNANT, RECTUM 06/27/2008  . Hyperlipidemia 06/27/2008  . SKIN CANCER, HX OF 06/27/2008  . History of colonic polyps 06/27/2008  . DEPRESSION 01/19/2007    Past Medical History:  Diagnosis Date  . Allergy   . Anal lesion 2007  . Anemia   . Anesthesia complication    Per pt/ sensitive to sedation!  . Central retinal artery occlusion 06/08/2016   limited vision right eye.  Marland Kitchen Cervical dysplasia 04/1991   Dr Rica Koyanagi - CIN I, cone and freeze in 1990s with normal paps since  . Constipation 06/08/2016   moves bowels every 3 rd day   . Decreased visual acuity 06/08/2016   Retinal bleed right eye 2017  . Depression   . Fatigue 06/27/2017  . H/O measles   . H/O mumps   . History of chicken pox   . Mitral valve prolapse    NO MR; no SBE prophylaxis needed  . PONV (postoperative nausea and vomiting)   . Pre-diabetes    highest A1c 6.4 %  . Preventative health care 12/21/2016  . Squamous cell carcinoma in situ of skin    Dr Syble Creek  . Vision impairment    limited vision in right eye.    Past Surgical History:  Procedure Laterality Date  .  CERVICAL CONE BIOPSY    . COLONOSCOPY  2003  . COLONOSCOPY  04/01/2011   [place] - repeat in 5 years  . COLONOSCOPY W/ POLYPECTOMY  2007   Dr Earlean Shawl  . LAMINECTOMY     1982  . OOPHORECTOMY Left 12/2011   Dr Ubaldo Glassing ; ovarian cyst  . RECTAL SURGERY  05/28/06   pre-canerous lesion removed in 2007; Dr Morton Stall, [facility]  . rt foot morton's surgery    . TUBAL LIGATION      Current Outpatient Medications  Medication Sig Dispense Refill  . Biotin 1 MG CAPS Take by mouth.    . blood glucose meter kit and supplies KIT Dispense based on patient and insurance preference. Use up to four times daily as directed. DX: E11.09 1 each 0  . Blood Glucose Monitoring Suppl (ONETOUCH VERIO IQ SYSTEM) w/Device KIT Use to check sugar daily E11.9 1 kit 0  . Calcium 200 MG TABS Take by mouth. Tale [phone] mg daily    . cetirizine (ZYRTEC) 10 MG tablet Take 10 mg by mouth daily.    . Ferrous Fumarate (FERROCITE) 324 (106 Fe) MG TABS tablet Take 1 tablet (106 mg of iron total) by  mouth daily. 90 tablet 0  . fluticasone (FLONASE) 50 MCG/ACT nasal spray Place 2 sprays into both nostrils daily. 16 g 6  . glucose blood (ONETOUCH VERIO) test strip Use as instructed to check sugar once daily E11.9 100 each 5  . lamoTRIgine (LAMICTAL) 200 MG tablet Take 200 mg by mouth daily.    . naproxen (NAPROSYN) 500 MG tablet Take 500 mg by mouth as needed.    Marland Kitchen omega-3 acid ethyl esters (LOVAZA) 1 g capsule Take by mouth daily.    [name] DELICA LANCETS FINE MISC Use to check sugar once daily E11.9 100 each 5  . TURMERIC PO Take by mouth.     No current facility-administered medications for this visit.      ALLERGIES: Cortisone; Pseudoephedrine; Metformin and related; and Shellfish allergy  Family History  Problem Relation Age of Onset  . Diabetes Mother   . Dementia Mother   . Prostate cancer Father   . COPD Father   . Heart disease Father        CAD - used nitroglycerin tablets  . Diabetes Sister        obese  .  Thyroid cancer Sister   . Diabetes Brother        not obese  . Lymphoma Brother        NHL  . Cancer Brother        non Hodgkin's Lymphoma  . Diabetes Maternal Grandmother        IDDM  . Diabetes Paternal Grandmother        IDDM  . Heart disease Paternal Grandmother   . Diabetes Maternal Aunt        X5 ; all IDDM  . Heart disease Maternal Aunt        "enlarged heart"  . Heart disease Maternal Uncle        "enlarged heart"  . Heart disease Paternal Grandfather        CAD - used nitroglycerin tablets  . Diabetes Brother   . Colon cancer Neg Hx   . Esophageal cancer Neg Hx   . Stomach cancer Neg Hx   . Stroke Neg Hx     Social History   Socioeconomic History  . Marital status: Single    Spouse name: Not on file  . Number of children: Not on file  . Years of education: Not on file  . Highest education level: Not on file  Social Needs  . Financial resource strain: Not on file  . Food insecurity - worry: Not on file  . Food insecurity - inability: Not on file  . Transportation needs - medical: Not on file  . Transportation needs - non-medical: Not on file  Occupational History  . Not on file  Tobacco Use  . Smoking status: Never Smoker  . Smokeless tobacco: Never Used  Substance and Sexual Activity  . Alcohol use: Yes    Comment: glass wine once a month  . Drug use: No  . Sexual activity: No    Partners: Male    Birth control/protection: Abstinence  Other Topics Concern  . Not on file  Social History Narrative   Lives alone   No dietary restrictions    Review of Systems  Constitutional: Negative.   HENT: Negative.   Eyes: Negative.   Respiratory: Negative.   Cardiovascular: Negative.   Gastrointestinal: Negative.   Genitourinary: Negative.   Musculoskeletal: Negative.   Skin: Negative.   Neurological: Negative.   Endo/Heme/Allergies: Negative.  Psychiatric/Behavioral: Negative.     PHYSICAL EXAMINATION:    BP 122/70 (BP Location: Right Arm,  Patient Position: Sitting, Cuff Size: Normal)   Pulse 60   Resp 12   Wt 164 lb (74.4 kg)   LMP 04/07/2001 (Approximate)   BMI 28.60 kg/m     General appearance: alert, cooperative and appears stated age   Anus: anal tag noted.   Chaperone was present for exam.  ASSESSMENT H/O anal dysplasia    PLAN Anal pap done   An After Visit Summary was printed and given to the patient.

## 2017-07-12 NOTE — Progress Notes (Addendum)
Review of Systems   Constitutional: Negative.    HENT: Negative.    Eyes: Negative.    Respiratory: Negative.    Cardiovascular: Negative.    Gastrointestinal: Negative.    Genitourinary: Negative.    Musculoskeletal: Negative.    Skin: Negative.    Neurological: Negative.    Endo/Heme/Allergies: Negative.    Psychiatric/Behavioral: Negative.

## 2017-07-13 DIAGNOSIS — Z853 Personal history of malignant neoplasm of breast: Secondary | ICD-10-CM | POA: Diagnosis not present

## 2017-07-24 DIAGNOSIS — R69 Illness, unspecified: Secondary | ICD-10-CM | POA: Diagnosis not present

## 2017-07-27 ENCOUNTER — Ambulatory Visit: Payer: Medicare HMO | Admitting: Internal Medicine

## 2017-08-12 DIAGNOSIS — R69 Illness, unspecified: Secondary | ICD-10-CM | POA: Diagnosis not present

## 2017-08-13 DIAGNOSIS — D2262 Melanocytic nevi of left upper limb, including shoulder: Secondary | ICD-10-CM | POA: Diagnosis not present

## 2017-08-13 DIAGNOSIS — D1801 Hemangioma of skin and subcutaneous tissue: Secondary | ICD-10-CM | POA: Diagnosis not present

## 2017-08-13 DIAGNOSIS — D2271 Melanocytic nevi of right lower limb, including hip: Secondary | ICD-10-CM | POA: Diagnosis not present

## 2017-08-13 DIAGNOSIS — Z85828 Personal history of other malignant neoplasm of skin: Secondary | ICD-10-CM | POA: Diagnosis not present

## 2017-08-13 DIAGNOSIS — D2272 Melanocytic nevi of left lower limb, including hip: Secondary | ICD-10-CM | POA: Diagnosis not present

## 2017-08-13 DIAGNOSIS — D225 Melanocytic nevi of trunk: Secondary | ICD-10-CM | POA: Diagnosis not present

## 2017-08-13 DIAGNOSIS — L819 Disorder of pigmentation, unspecified: Secondary | ICD-10-CM | POA: Diagnosis not present

## 2017-08-13 DIAGNOSIS — D2261 Melanocytic nevi of right upper limb, including shoulder: Secondary | ICD-10-CM | POA: Diagnosis not present

## 2017-08-13 DIAGNOSIS — L918 Other hypertrophic disorders of the skin: Secondary | ICD-10-CM | POA: Diagnosis not present

## 2017-08-13 DIAGNOSIS — L820 Inflamed seborrheic keratosis: Secondary | ICD-10-CM | POA: Diagnosis not present

## 2017-09-28 ENCOUNTER — Encounter: Payer: Self-pay | Admitting: Obstetrics and Gynecology

## 2017-09-30 ENCOUNTER — Telehealth: Payer: Self-pay | Admitting: Family Medicine

## 2017-09-30 ENCOUNTER — Ambulatory Visit (INDEPENDENT_AMBULATORY_CARE_PROVIDER_SITE_OTHER): Payer: Medicare HMO | Admitting: Family Medicine

## 2017-09-30 DIAGNOSIS — E785 Hyperlipidemia, unspecified: Secondary | ICD-10-CM | POA: Diagnosis not present

## 2017-09-30 DIAGNOSIS — M8580 Other specified disorders of bone density and structure, unspecified site: Secondary | ICD-10-CM

## 2017-09-30 DIAGNOSIS — K5900 Constipation, unspecified: Secondary | ICD-10-CM

## 2017-09-30 DIAGNOSIS — E119 Type 2 diabetes mellitus without complications: Secondary | ICD-10-CM | POA: Diagnosis not present

## 2017-09-30 DIAGNOSIS — D649 Anemia, unspecified: Secondary | ICD-10-CM

## 2017-09-30 DIAGNOSIS — Z0000 Encounter for general adult medical examination without abnormal findings: Secondary | ICD-10-CM

## 2017-09-30 LAB — COMPREHENSIVE METABOLIC PANEL
ALBUMIN: 4.5 g/dL (ref 3.5–5.2)
ALT: 18 U/L (ref 0–35)
AST: 19 U/L (ref 0–37)
Alkaline Phosphatase: 52 U/L (ref 39–117)
BILIRUBIN TOTAL: 0.6 mg/dL (ref 0.2–1.2)
BUN: 20 mg/dL (ref 6–23)
CO2: 32 mEq/L (ref 19–32)
CREATININE: 0.96 mg/dL (ref 0.40–1.20)
Calcium: 9.6 mg/dL (ref 8.4–10.5)
Chloride: 102 mEq/L (ref 96–112)
GFR: 61.4 mL/min (ref >60.00)
GLUCOSE: 143 mg/dL — AB (ref 70–99)
Potassium: 5.2 mEq/L — ABNORMAL HIGH (ref 3.5–5.1)
SODIUM: 140 meq/L (ref 135–145)
TOTAL PROTEIN: 7.3 g/dL (ref 6.0–8.3)

## 2017-09-30 LAB — CBC
HCT: 39.1 % (ref 36.0–46.0)
Hemoglobin: 13 g/dL (ref 12.0–15.0)
MCHC: 33.2 g/dL (ref 30.0–36.0)
MCV: 89.7 fl (ref 78.0–100.0)
Platelets: 284 10*3/uL (ref 150.0–400.0)
RBC: 4.36 Mil/uL (ref 3.87–5.11)
RDW: 13.4 % (ref 11.5–15.5)
WBC: 7.2 10*3/uL (ref 4.0–10.5)

## 2017-09-30 LAB — LIPID PANEL
Cholesterol: 235 mg/dL — ABNORMAL HIGH (ref 0–200)
HDL: 83.7 mg/dL (ref >39.00)
LDL Cholesterol: 139 mg/dL — ABNORMAL HIGH (ref 0–99)
NONHDL: 151.41
Total CHOL/HDL Ratio: 3
Triglycerides: 61 mg/dL (ref 0.0–149.0)
VLDL: 12.2 mg/dL (ref 0.0–40.0)

## 2017-09-30 LAB — TSH: TSH: 1.92 u[IU]/mL (ref 0.35–4.50)

## 2017-09-30 NOTE — Assessment & Plan Note (Signed)
Is struggling with significant stress from moving and having complications. Is managing with current meds. No changes today

## 2017-09-30 NOTE — Assessment & Plan Note (Signed)
Patient encouraged to maintain heart healthy diet, regular exercise, adequate sleep. Consider daily probiotics. Take medications as prescribed. Labs reviewed 

## 2017-09-30 NOTE — Patient Instructions (Signed)
Anemia Anemia is a condition in which you do not have enough red blood cells or hemoglobin. Hemoglobin is a substance in red blood cells that carries oxygen. When you do not have enough red blood cells or hemoglobin (are anemic), your body cannot get enough oxygen and your organs may not work properly. As a result, you may feel very tired or have other problems. What are the causes? Common causes of anemia include:  Excessive bleeding. Anemia can be caused by excessive bleeding inside or outside the body, including bleeding from the intestine or from periods in women.  Poor nutrition.  Long-lasting (chronic) kidney, thyroid, and liver disease.  Bone marrow disorders.  Cancer and treatments for cancer.  HIV (human immunodeficiency virus) and AIDS (acquired immunodeficiency syndrome).  Treatments for HIV and AIDS.  Spleen problems.  Blood disorders.  Infections, medicines, and autoimmune disorders that destroy red blood cells.  What are the signs or symptoms? Symptoms of this condition include:  Minor weakness.  Dizziness.  Headache.  Feeling heartbeats that are irregular or faster than normal (palpitations).  Shortness of breath, especially with exercise.  Paleness.  Cold sensitivity.  Indigestion.  Nausea.  Difficulty sleeping.  Difficulty concentrating.  Symptoms may occur suddenly or develop slowly. If your anemia is mild, you may not have symptoms. How is this diagnosed? This condition is diagnosed based on:  Blood tests.  Your medical history.  A physical exam.  Bone marrow biopsy.  Your health care provider may also check your stool (feces) for blood and may do additional testing to look for the cause of your bleeding. You may also have other tests, including:  Imaging tests, such as a CT scan or MRI.  Endoscopy.  Colonoscopy.  How is this treated? Treatment for this condition depends on the cause. If you continue to lose a lot of blood,  you may need to be treated at a hospital. Treatment may include:  Taking supplements of iron, vitamin B12, or folic acid.  Taking a hormone medicine (erythropoietin) that can help to stimulate red blood cell growth.  Having a blood transfusion. This may be needed if you lose a lot of blood.  Making changes to your diet.  Having surgery to remove your spleen.  Follow these instructions at home:  Take over-the-counter and prescription medicines only as told by your health care provider.  Take supplements only as told by your health care provider.  Follow any diet instructions that you were given.  Keep all follow-up visits as told by your health care provider. This is important. Contact a health care provider if:  You develop new bleeding anywhere in the body. Get help right away if:  You are very weak.  You are short of breath.  You have pain in your abdomen or chest.  You are dizzy or feel faint.  You have trouble concentrating.  You have bloody or black, tarry stools.  You vomit repeatedly or you vomit up blood. Summary  Anemia is a condition in which you do not have enough red blood cells or enough of a substance in your red blood cells that carries oxygen (hemoglobin).  Symptoms may occur suddenly or develop slowly.  If your anemia is mild, you may not have symptoms.  This condition is diagnosed with blood tests as well as a medical history and physical exam. Other tests may be needed.  Treatment for this condition depends on the cause of the anemia. This information is not intended to replace advice   given to you by your health care provider. Make sure you discuss any questions you have with your health care provider. Document Released: 10/01/2004 Document Revised: 09/25/2016 Document Reviewed: 09/25/2016 Elsevier Interactive Patient Education  [employer].

## 2017-09-30 NOTE — Telephone Encounter (Signed)
Copied from [place] [id number]. Topic: General - Other >> Sep 30, 2017  1:42 PM Yvette Rack wrote: Reason for CRM: [id number] [place] [place] [facility] to let your practice know that the patient isn't a patient of [facility] and never filled a RX for patient and that the [facility] on [address] and at the one on [facility]  >> Sep 30, 2017  1:49 PM Yvette Rack wrote: They has received a fax of patients immunizations for Flu Shots

## 2017-09-30 NOTE — Assessment & Plan Note (Signed)
Encouraged heart healthy diet, increase exercise, avoid trans fats, consider a krill oil cap daily 

## 2017-09-30 NOTE — Progress Notes (Signed)
Subjective:  I acted as a Education administrator for Dr. Charlett Blake. [place], [place]  Patient ID: Destiny Romero, female    DOB: 11/05/1950, 67 y.o.   MRN: [id number]  No chief complaint on file.   HPI  Patient is in today for a 3 month follow up and is feeling mostly well today but she is noting some increased fatigue and sob with exertion. It is not severe but feels similar to when she became anemic in past. No recent febrile illness or hospitalizations. She has not been exercising but is agreeing to start again this week. Denies CP/palp/HA/congestion/fevers/GI or GU c/o. Taking meds as prescribed  Patient Care Team: Mosie Lukes, MD as PCP - General (Family Medicine) Gardiner Barefoot, DPM as Consulting Physician (Podiatry) Philemon Kingdom, MD as Consulting Physician (Internal Medicine) Milus Banister, MD as Consulting Physician (Gastroenterology) Kem Boroughs, [place] as Nurse Practitioner (Family Medicine) Eliseo Gum, MD as Consulting Physician (Psychiatry) Rolm Bookbinder, MD as Consulting Physician (Dermatology) Hayden Pedro, MD as Consulting Physician (Ophthalmology)   Past Medical History:  Diagnosis Date  . Allergy   . Anal lesion 2007  . Anemia   . Anesthesia complication    Per pt/ sensitive to sedation!  . Central retinal artery occlusion 06/08/2016   limited vision right eye.  Marland Kitchen Cervical dysplasia 04/1991   Dr Rica Koyanagi - CIN I, cone and freeze in 1990s with normal paps since  . Constipation 06/08/2016   moves bowels every 3 rd day   . Decreased visual acuity 06/08/2016   Retinal bleed right eye 2017  . Depression   . Fatigue 06/27/2017  . H/O measles   . H/O mumps   . History of chicken pox   . Mitral valve prolapse    NO MR; no SBE prophylaxis needed  . PONV (postoperative nausea and vomiting)   . Pre-diabetes    highest A1c 6.4 %  . Preventative health care 12/21/2016  . Squamous cell carcinoma in situ of skin    Dr Syble Creek  . Vision impairment    limited vision  in right eye.    Past Surgical History:  Procedure Laterality Date  . CERVICAL CONE BIOPSY    . COLONOSCOPY  2003  . COLONOSCOPY  04/01/2011   [place] - repeat in 5 years  . COLONOSCOPY W/ POLYPECTOMY  2007   Dr Earlean Shawl  . LAMINECTOMY     1982  . OOPHORECTOMY Left 12/2011   Dr Ubaldo Glassing ; ovarian cyst  . RECTAL SURGERY  05/28/06   pre-canerous lesion removed in 2007; Dr Morton Stall, [facility]  . rt foot morton's surgery    . TUBAL LIGATION      Family History  Problem Relation Age of Onset  . Diabetes Mother   . Dementia Mother   . Prostate cancer Father   . COPD Father   . Heart disease Father        CAD - used nitroglycerin tablets  . Diabetes Sister        obese  . Thyroid cancer Sister   . Diabetes Brother        not obese  . Lymphoma Brother        NHL  . Cancer Brother        non Hodgkin's Lymphoma  . Diabetes Maternal Grandmother        IDDM  . Diabetes Paternal Grandmother        IDDM  . Heart disease Paternal Grandmother   . Diabetes Maternal  Aunt        X5 ; all IDDM  . Heart disease Maternal Aunt        "enlarged heart"  . Heart disease Maternal Uncle        "enlarged heart"  . Heart disease Paternal Grandfather        CAD - used nitroglycerin tablets  . Diabetes Brother   . Colon cancer Neg Hx   . Esophageal cancer Neg Hx   . Stomach cancer Neg Hx   . Stroke Neg Hx     Social History   Socioeconomic History  . Marital status: Single    Spouse name: Not on file  . Number of children: Not on file  . Years of education: Not on file  . Highest education level: Not on file  Social Needs  . Financial resource strain: Not on file  . Food insecurity - worry: Not on file  . Food insecurity - inability: Not on file  . Transportation needs - medical: Not on file  . Transportation needs - non-medical: Not on file  Occupational History  . Not on file  Tobacco Use  . Smoking status: Never Smoker  . Smokeless tobacco: Never Used  Substance and Sexual Activity    . Alcohol use: Yes    Comment: glass wine once a month  . Drug use: No  . Sexual activity: No    Partners: Male    Birth control/protection: Abstinence  Other Topics Concern  . Not on file  Social History Narrative   Lives alone   No dietary restrictions    Outpatient Medications Prior to Visit  Medication Sig Dispense Refill  . Biotin 1 MG CAPS Take by mouth.    . blood glucose meter kit and supplies KIT Dispense based on patient and insurance preference. Use up to four times daily as directed. DX: E11.09 1 each 0  . Blood Glucose Monitoring Suppl (ONETOUCH VERIO IQ SYSTEM) w/Device KIT Use to check sugar daily E11.9 1 kit 0  . Calcium 200 MG TABS Take by mouth. Tale [phone] mg daily    . cetirizine (ZYRTEC) 10 MG tablet Take 10 mg by mouth daily.    . Ferrous Fumarate (FERROCITE) 324 (106 Fe) MG TABS tablet Take 1 tablet (106 mg of iron total) by mouth daily. 90 tablet 0  . fluticasone (FLONASE) 50 MCG/ACT nasal spray Place 2 sprays into both nostrils daily. 16 g 6  . glucose blood (ONETOUCH VERIO) test strip Use as instructed to check sugar once daily E11.9 100 each 5  . lamoTRIgine (LAMICTAL) 200 MG tablet Take 200 mg by mouth daily.    . naproxen (NAPROSYN) 500 MG tablet Take 500 mg by mouth as needed.    Marland Kitchen omega-3 acid ethyl esters (LOVAZA) 1 g capsule Take by mouth daily.    [name] DELICA LANCETS FINE MISC Use to check sugar once daily E11.9 100 each 5  . TURMERIC PO Take by mouth.     No facility-administered medications prior to visit.     Allergies  Allergen Reactions  . Cortisone Palpitations and Other (See Comments)    hyperactivity  . Pseudoephedrine     tachycardia  . Metformin And Related     04/15/15 caused dizziness  . Shellfish Allergy Other (See Comments)    [other]    Review of Systems  Constitutional: Positive for malaise/fatigue. Negative for fever.  HENT: Negative for congestion.   Eyes: Negative for blurred vision.  Respiratory: Negative  for cough and shortness of breath.   Cardiovascular: Negative for chest pain, palpitations and leg swelling.  Gastrointestinal: Negative for vomiting.  Musculoskeletal: Negative for back pain.  Skin: Negative for rash.  Neurological: Negative for loss of consciousness and headaches.  Psychiatric/Behavioral: Positive for depression.       Objective:    Physical Exam  Constitutional: She is oriented to person, place, and time. She appears well-developed and well-nourished. No distress.  HENT:  Head: Normocephalic and atraumatic.  Eyes: Conjunctivae are normal.  Neck: Normal range of motion. No thyromegaly present.  Cardiovascular: Normal rate and regular rhythm.  Pulmonary/Chest: Effort normal and breath sounds normal. She has no wheezes.  Abdominal: Soft. Bowel sounds are normal. There is no tenderness.  Musculoskeletal: Normal range of motion. She exhibits no edema or deformity.  Neurological: She is alert and oriented to person, place, and time.  Skin: Skin is warm and dry. She is not diaphoretic.  Psychiatric: She has a normal mood and affect.    BP 108/68 (BP Location: Left Arm, Patient Position: Sitting, Cuff Size: Normal)   Pulse (!) 59   Temp 97.8 F (36.6 C) (Oral)   Resp 18   Wt 164 lb 3.2 oz (74.5 kg)   LMP 04/07/2001 (Approximate)   SpO2 98%   BMI 28.63 kg/m  Wt Readings from Last 3 Encounters:  09/30/17 164 lb 3.2 oz (74.5 kg)  07/12/17 164 lb (74.4 kg)  07/05/17 161 lb (73 kg)   BP Readings from Last 3 Encounters:  09/30/17 108/68  07/12/17 122/70  07/05/17 118/60     Immunization History  Administered Date(s) Administered  . Hepatitis B 01/27/2013  . Influenza Split 06/28/2012  . Influenza, High Dose Seasonal PF 10/05/2016, 06/15/2017  . Influenza,inj,Quad PF,6+ Mos 07/25/2013, 06/14/2014, 07/11/2015  . Tdap 02/03/2013  . Zoster 04/22/2010  . Zoster Recombinat (Shingrix) 12/29/2016    Health Maintenance  Topic Date Due  . PNA vac Low Risk  Adult (1 of 2 - PCV13) 08/09/2014  . OPHTHALMOLOGY EXAM  09/15/2015  . URINE MICROALBUMIN  07/10/2016  . HEMOGLOBIN A1C  09/26/2017  . FOOT EXAM  10/08/2017  . MAMMOGRAM  07/13/2018  . COLONOSCOPY  09/15/2021  . TETANUS/TDAP  02/04/2023  . INFLUENZA VACCINE  Completed  . DEXA SCAN  Completed  . Hepatitis C Screening  Completed    Lab Results  Component Value Date   WBC 7.4 06/25/2017   HGB 13.1 06/25/2017   HCT 39.8 06/25/2017   PLT 274.0 06/25/2017   GLUCOSE 110 (H) 06/25/2017   CHOL 228 (H) 06/25/2017   TRIG 112.0 06/25/2017   HDL 82.90 06/25/2017   LDLDIRECT 146.5 08/11/2013   LDLCALC 123 (H) 06/25/2017   ALT 21 06/25/2017   AST 21 06/25/2017   NA 142 06/25/2017   K 5.1 06/25/2017   CL 103 06/25/2017   CREATININE 0.98 06/25/2017   BUN 13 06/25/2017   CO2 32 06/25/2017   TSH 1.33 06/25/2017   HGBA1C 6.6 03/26/2017   MICROALBUR <0.7 07/11/2015    Lab Results  Component Value Date   TSH 1.33 06/25/2017   Lab Results  Component Value Date   WBC 7.4 06/25/2017   HGB 13.1 06/25/2017   HCT 39.8 06/25/2017   MCV 91.3 06/25/2017   PLT 274.0 06/25/2017   Lab Results  Component Value Date   NA 142 06/25/2017   K 5.1 06/25/2017   CO2 32 06/25/2017   GLUCOSE 110 (H) 06/25/2017   BUN 13 06/25/2017  CREATININE 0.98 06/25/2017   BILITOT 0.6 06/25/2017   ALKPHOS 45 06/25/2017   AST 21 06/25/2017   ALT 21 06/25/2017   PROT 7.1 06/25/2017   ALBUMIN 4.4 06/25/2017   CALCIUM 9.9 06/25/2017   GFR 60.01 06/25/2017   Lab Results  Component Value Date   CHOL 228 (H) 06/25/2017   Lab Results  Component Value Date   HDL 82.90 06/25/2017   Lab Results  Component Value Date   LDLCALC 123 (H) 06/25/2017   Lab Results  Component Value Date   TRIG 112.0 06/25/2017   Lab Results  Component Value Date   CHOLHDL 3 06/25/2017   Lab Results  Component Value Date   HGBA1C 6.6 03/26/2017         Assessment & Plan:   Problem List Items Addressed This Visit     Hyperlipidemia    Encouraged heart healthy diet, increase exercise, avoid trans fats, consider a krill oil cap daily      Osteopenia    Encouraged to get adequate exercise, calcium and vitamin d intake      Diabetes type 2, controlled (HCC)    hgba1c acceptable, minimize simple carbs. Increase exercise as tolerated. Continue current meds      Anemia    Increase leafy greens, consider increased lean red meat and using cast iron cookware. Continue to monitor, report any concerns         I am having Vivi Barrack maintain her blood glucose meter kit and supplies, lamoTRIgine, naproxen, Calcium, omega-3 acid ethyl esters, ONETOUCH VERIO IQ SYSTEM, glucose blood, ONETOUCH DELICA LANCETS FINE, fluticasone, cetirizine, Biotin, TURMERIC PO, and Ferrous Fumarate.  No orders of the defined types were placed in this encounter.   CMA served as Education administrator during this visit. History, Physical and Plan performed by medical provider. Documentation and orders reviewed and attested to.  Penni Homans, MD

## 2017-09-30 NOTE — Assessment & Plan Note (Signed)
Encouraged to get adequate exercise, calcium and vitamin d intake 

## 2017-09-30 NOTE — Telephone Encounter (Signed)
Copied from [place] [id number]. Topic: General - Other >> Sep 30, 2017  1:42 PM Yvette Rack wrote: Reason for CRM: [id number] [place] [place] [facility] to let your practice know that the patient isn't a patient of [facility] and never filled a RX for patient and that the [facility] the [facility] on [address] and at the one on [facility]

## 2017-09-30 NOTE — Assessment & Plan Note (Signed)
hgba1c acceptable, minimize simple carbs. Increase exercise as tolerated. Continue current meds 

## 2017-09-30 NOTE — Assessment & Plan Note (Signed)
Is worried about leaky gut.

## 2017-09-30 NOTE — Assessment & Plan Note (Signed)
Increase leafy greens, consider increased lean red meat and using cast iron cookware. Continue to monitor, report any concerns 

## 2017-09-30 NOTE — Telephone Encounter (Signed)
Ca we use information provided to do a new release of records to the correct [facility] for her immunizations. please

## 2017-10-05 NOTE — Telephone Encounter (Signed)
Do you have a copy of her release of records

## 2017-10-06 NOTE — Telephone Encounter (Signed)
Kennyth Lose scanned it to her file so [facility] [address] . Hopefully this will be sufficient.

## 2017-10-12 ENCOUNTER — Ambulatory Visit: Payer: Medicare HMO | Admitting: Internal Medicine

## 2017-10-19 ENCOUNTER — Other Ambulatory Visit: Payer: Self-pay

## 2017-10-19 ENCOUNTER — Encounter: Payer: Self-pay | Admitting: Internal Medicine

## 2017-10-19 ENCOUNTER — Ambulatory Visit: Payer: Medicare HMO | Admitting: Internal Medicine

## 2017-10-19 VITALS — BP 136/84 | HR 62 | Ht 63.5 in | Wt 167.8 lb

## 2017-10-19 DIAGNOSIS — E119 Type 2 diabetes mellitus without complications: Secondary | ICD-10-CM

## 2017-10-19 DIAGNOSIS — R69 Illness, unspecified: Secondary | ICD-10-CM | POA: Diagnosis not present

## 2017-10-19 LAB — POCT GLYCOSYLATED HEMOGLOBIN (HGB A1C): Hemoglobin A1C: 6.9

## 2017-10-19 MED ORDER — GLUCOSE BLOOD VI STRP: ORAL_STRIP | 3 refills | Status: DC

## 2017-10-19 MED ORDER — GLUCOSE BLOOD VI STRP: ORAL_STRIP | 4 refills | Status: DC

## 2017-10-19 MED ORDER — ACCU-CHEK FASTCLIX LANCETS MISC: 3 refills | Status: DC

## 2017-10-19 NOTE — Progress Notes (Signed)
Patient ID: Destiny Romero, female   DOB: 27-Apr-1951, 67 y.o.   MRN: [id number]   HPI: Destiny Romero is a 67 y.o.-year-old female, returning for follow-up for DM2, dx in 2014, diet controlled, controlled, without long term complications.  Last visit 7 months ago.  Last hemoglobin A1c was: Lab Results  Component Value Date   HGBA1C 6.9 10/19/2017   HGBA1C 6.6 03/26/2017   HGBA1C 6.7 10/30/2016   HGBA1C 7.0 (H) 06/08/2016   HGBA1C 6.7 (H) 07/11/2015   HGBA1C 6.6 (H) 03/19/2015   HGBA1C 6.5 06/14/2014   HGBA1C 6.6 (H) 08/11/2013   HGBA1C 6.4 04/25/2012   HGBA1C 6.4 06/25/2011   HGBA1C 6.2 01/30/2011   HGBA1C 6.1 08/25/2010   HGBA1C 6.1 01/08/2010   HGBA1C 6.1 07/12/2009   HGBA1C 6.4 01/11/2009   HGBA1C 6.2 (H) 06/27/2008   HGBA1C 6.3 (H) 05/23/2007   HGBA1C 6.5 (H) 01/28/2007   Patient's diabetes is diet controlled. She has dizziness from metformin before.  Pt checks her sugars 0 to once a day: - am: 90-110, more recently 120-140 >> 97, 110, 120-140 (higher if snack at night) >> 120-150 - 2h after b'fast: n/c - before lunch: 100 >> n/c - 2h after lunch: n/c - before dinner: n/c - 2h after dinner: n/c - bedtime: 110-120 >> n/c - nighttime: n/c No lows. Lowest sugar was 90 >> 97 >> 110; unclear at what level she has hypoglycemia awareness Highest sugar was 140 >> 180 (pizza) x1, 150 >> 150  Glucometer: Accu-Chek Aviva plus  Pt's meals are: - Breakfast: egg, toast, bacon; egg, fruit, toast; occasionally pancakes - Lunch: sandwich, fruit, cottage cheese, salad - Dinner: meat, 1-2 veggies - Snacks: mid-am or mid-pm - cheese or fruit, granola  She was exercising at the gym (restarted generally) >> 30-45 minutes at a time + weight training >> sugars are better in a.m. when she exercises  -No CKD, last BUN/creatinine:  Lab Results  Component Value Date   BUN 20 09/30/2017   BUN 13 06/25/2017   CREATININE 0.96 09/30/2017   CREATININE 0.98 06/25/2017   -+ HL; last set of  lipids: Lab Results  Component Value Date   CHOL 235 (H) 09/30/2017   HDL 83.70 09/30/2017   LDLCALC 139 (H) 09/30/2017   LDLDIRECT 146.5 08/11/2013   TRIG 61.0 09/30/2017   CHOLHDL 3 09/30/2017  She is not on a statin. - last eye exam was in 02/2017: No DR she had a retinal bleed- R eye. Had Laser Sx. she has decreased vision in the right eye - + tingling in her feet, no numbness  Patient has a history of anemia, on Fe supplements since 2017.  Latest hemoglobin normal: Lab Results  Component Value Date   HGB 13.0 09/30/2017   ROS: Constitutional: + weight gain/no weight loss, + fatigue, no subjective hyperthermia, + subjective hypothermia Eyes:+ blurry vision, no xerophthalmia ENT: no sore throat, no nodules palpated in throat, no dysphagia, no odynophagia, no hoarseness Cardiovascular: no CP/+ SOB/no palpitations/no leg swelling Respiratory: no cough/+ SOB/no wheezing Gastrointestinal: no N/no V/no D/no C/no acid reflux Musculoskeletal: no muscle aches/no joint aches Skin: no rashes, no hair loss Neurological: no tremors/no numbness/+ tingling/no dizziness  I reviewed pt's medications, allergies, PMH, social hx, family hx, and changes were documented in the history of present illness. Otherwise, unchanged from my initial visit note.  Past Medical History:  Diagnosis Date  . Allergy   . Anal lesion 2007  . Anemia   . Anesthesia  complication    Per pt/ sensitive to sedation!  . Central retinal artery occlusion 06/08/2016   limited vision right eye.  Marland Kitchen Cervical dysplasia 04/1991   Dr Rica Koyanagi - CIN I, cone and freeze in 1990s with normal paps since  . Constipation 06/08/2016   moves bowels every 3 rd day   . Decreased visual acuity 06/08/2016   Retinal bleed right eye 2017  . Depression   . Fatigue 06/27/2017  . H/O measles   . H/O mumps   . History of chicken pox   . Mitral valve prolapse    NO MR; no SBE prophylaxis needed  . PONV (postoperative nausea and vomiting)    . Pre-diabetes    highest A1c 6.4 %  . Preventative health care 12/21/2016  . Squamous cell carcinoma in situ of skin    Dr Syble Creek  . Vision impairment    limited vision in right eye.   Past Surgical History:  Procedure Laterality Date  . CERVICAL CONE BIOPSY    . COLONOSCOPY  2003  . COLONOSCOPY  04/01/2011   [place] - repeat in 5 years  . COLONOSCOPY W/ POLYPECTOMY  2007   Dr Earlean Shawl  . LAMINECTOMY     1982  . OOPHORECTOMY Left 12/2011   Dr Ubaldo Glassing ; ovarian cyst  . RECTAL SURGERY  05/28/06   pre-canerous lesion removed in 2007; Dr Morton Stall, [facility]  . rt foot morton's surgery    . TUBAL LIGATION     Social History   Socioeconomic History  . Marital status: Single    Spouse name: Not on file  . Number of children: Not on file  . Years of education: Not on file  . Highest education level: Not on file  Social Needs  . Financial resource strain: Not on file  . Food insecurity - worry: Not on file  . Food insecurity - inability: Not on file  . Transportation needs - medical: Not on file  . Transportation needs - non-medical: Not on file  Occupational History  . Not on file  Tobacco Use  . Smoking status: Never Smoker  . Smokeless tobacco: Never Used  Substance and Sexual Activity  . Alcohol use: Yes    Comment: glass wine once a month  . Drug use: No  . Sexual activity: No    Partners: Male    Birth control/protection: Abstinence  Other Topics Concern  . Not on file  Social History Narrative   Lives alone   No dietary restrictions   Current Outpatient Medications on File Prior to Visit  Medication Sig Dispense Refill  . Biotin 1 MG CAPS Take by mouth.    . blood glucose meter kit and supplies KIT Dispense based on patient and insurance preference. Use up to four times daily as directed. DX: E11.09 1 each 0  . Blood Glucose Monitoring Suppl (ONETOUCH VERIO IQ SYSTEM) w/Device KIT Use to check sugar daily E11.9 1 kit 0  . Calcium 200 MG TABS Take by mouth. Tale  [phone] mg daily    . cetirizine (ZYRTEC) 10 MG tablet Take 10 mg by mouth daily.    . Ferrous Fumarate (FERROCITE) 324 (106 Fe) MG TABS tablet Take 1 tablet (106 mg of iron total) by mouth daily. 90 tablet 0  . fluticasone (FLONASE) 50 MCG/ACT nasal spray Place 2 sprays into both nostrils daily. 16 g 6  . glucose blood (ONETOUCH VERIO) test strip Use as instructed to check sugar once daily E11.9 100 each  5  . lamoTRIgine (LAMICTAL) 200 MG tablet Take 200 mg by mouth daily.    . naproxen (NAPROSYN) 500 MG tablet Take 500 mg by mouth as needed.    Marland Kitchen omega-3 acid ethyl esters (LOVAZA) 1 g capsule Take by mouth daily.    [name] DELICA LANCETS FINE MISC Use to check sugar once daily E11.9 100 each 5  . TURMERIC PO Take by mouth.     No current facility-administered medications on file prior to visit.    Allergies  Allergen Reactions  . Cortisone Palpitations and Other (See Comments)    hyperactivity  . Pseudoephedrine     tachycardia  . Metformin And Related     04/15/15 caused dizziness  . Shellfish Allergy Other (See Comments)    [other]   Family History  Problem Relation Age of Onset  . Diabetes Mother   . Dementia Mother   . Prostate cancer Father   . COPD Father   . Heart disease Father        CAD - used nitroglycerin tablets  . Diabetes Sister        obese  . Thyroid cancer Sister   . Diabetes Brother        not obese  . Lymphoma Brother        NHL  . Cancer Brother        non Hodgkin's Lymphoma  . Diabetes Maternal Grandmother        IDDM  . Diabetes Paternal Grandmother        IDDM  . Heart disease Paternal Grandmother   . Diabetes Maternal Aunt        X5 ; all IDDM  . Heart disease Maternal Aunt        "enlarged heart"  . Heart disease Maternal Uncle        "enlarged heart"  . Heart disease Paternal Grandfather        CAD - used nitroglycerin tablets  . Diabetes Brother   . Colon cancer Neg Hx   . Esophageal cancer Neg Hx   . Stomach cancer Neg Hx   .  Stroke Neg Hx     PE: BP 136/84 (BP Location: Left Arm, Patient Position: Sitting, Cuff Size: Normal)   Pulse 62   Ht 5' 3.5" (1.613 m)   Wt 167 lb 12.8 oz (76.1 kg)   LMP 04/07/2001 (Approximate)   BMI 29.26 kg/m  Wt Readings from Last 3 Encounters:  10/19/17 167 lb 12.8 oz (76.1 kg)  09/30/17 164 lb 3.2 oz (74.5 kg)  07/12/17 164 lb (74.4 kg)   Constitutional: slightly weight, in NAD Eyes: PERRLA, EOMI, no exophthalmos ENT: moist mucous membranes, no thyromegaly, no cervical lymphadenopathy Cardiovascular: RRR, No MRG Respiratory: CTA B Gastrointestinal: abdomen soft, NT, ND, BS+ Musculoskeletal: no deformities, strength intact in all 4 Skin: moist, warm, no rashes Neurological: no tremor with outstretched hands, DTR normal in all 4  ASSESSMENT: 1. DM2, diet controlled, controlled, without long term complications  2. Anemia  PLAN:  1. Patient with relatively recent diagnosis of diabetes, diet controlled.  She has an extensive family history of diabetes and was closely followed for the development of diabetes over the years, with the HbA1c increasing in the low diabetic range in 2014.  At last visit, HbA1c was 6.6%, while today, it is HbA1c is 6.9% (higher).  She is more stressed at work, was not exercising that much since last visit, and also relax her diet during the holiday.  She also has seasonal dysthymia during the winter.  She did restart to go to the gym recently and would like to continue.  Discussed benefits of exercise on both blood sugars and mood. - She did not check many sugars at home since last visit and I strongly advised her to start doing so >>  She needs to check once a day or every other day - Today I gave her a new meter, Accu-Chek guide and sent prescription for lancets and strips to her pharmacy - No medication needed for now - I suggested to:  Patient Instructions  Please check sugars 1x a day.  Please let me know if the sugars are consistently <80 or  >200.  Please return in 6 months with your sugar log.   - advised for yearly eye exams >> she is UTD - Return to clinic in 3 mo with sugar log   2. HL - Reviewed lipid panel from last month: LDL slightly improved, but still high  in the context of diabetes - discussed about restarting exercise - She may need to start a statin if no improvement in LDL beyond 100 mg/dL  - time spent with the patient: 25 minutes, of which >50% was spent in obtaining information about her symptoms, reviewing her previous labs, evaluations, and treatments, counseling her about her condition (please see  Above).   Philemon Kingdom, MD PhD [facility]

## 2017-10-19 NOTE — Patient Instructions (Signed)
Please check sugars 1x a day.  Please let me know if the sugars are consistently <80 or >200.  Please return in 6 months with your sugar log.  

## 2017-10-21 ENCOUNTER — Other Ambulatory Visit: Payer: Self-pay

## 2017-10-21 DIAGNOSIS — R69 Illness, unspecified: Secondary | ICD-10-CM | POA: Diagnosis not present

## 2017-10-21 MED ORDER — ONETOUCH LANCETS MISC: 1 refills | Status: DC

## 2017-10-21 MED ORDER — GLUCOSE BLOOD VI STRP: ORAL_STRIP | 1 refills | Status: DC

## 2017-10-21 MED ORDER — ONETOUCH VERIO W/DEVICE KIT: 1.0000 | PACK | Freq: Once | 0 refills | Status: AC

## 2017-11-02 DIAGNOSIS — G43109 Migraine with aura, not intractable, without status migrainosus: Secondary | ICD-10-CM | POA: Diagnosis not present

## 2017-11-02 DIAGNOSIS — H43391 Other vitreous opacities, right eye: Secondary | ICD-10-CM | POA: Diagnosis not present

## 2017-11-03 DIAGNOSIS — R69 Illness, unspecified: Secondary | ICD-10-CM | POA: Diagnosis not present

## 2017-12-17 NOTE — Progress Notes (Deleted)
Subjective:   Destiny Romero is a 67 y.o. female who presents for Medicare Annual (Subsequent) preventive examination.  Review of Systems: No ROS.  Medicare Wellness Visit. Additional risk factors are reflected in the social history.    Sleep patterns:   Home Safety/Smoke Alarms: Feels safe in home. Smoke alarms in place.  Living environment; residence and Firearm Safety:    Female:   Pap-   utd    Mammo- utd      Dexa scan- utd       CCS-utd  Objective:     Vitals: LMP 04/07/2001 (Approximate)   There is no height or weight on file to calculate BMI.  Advanced Directives 12/21/2016 09/15/2016 09/06/2014  Does Patient Have a Medical Advance Directive? Yes Yes Yes  Type of Paramedic of [place];Living will - Living will;Out of facility DNR (pink MOST or yellow form)  Copy of [place] in Chart? No - copy requested - -    Tobacco Social History   Tobacco Use  Smoking Status Never Smoker  Smokeless Tobacco Never Used     Counseling given: Not Answered   Clinical Intake:                       Past Medical History:  Diagnosis Date  . Allergy   . Anal lesion 2007  . Anemia   . Anesthesia complication    Per pt/ sensitive to sedation!  . Central retinal artery occlusion 06/08/2016   limited vision right eye.  Marland Kitchen Cervical dysplasia 04/1991   Dr Rica Koyanagi - CIN I, cone and freeze in 1990s with normal paps since  . Constipation 06/08/2016   moves bowels every 3 rd day   . Decreased visual acuity 06/08/2016   Retinal bleed right eye 2017  . Depression   . Fatigue 06/27/2017  . H/O measles   . H/O mumps   . History of chicken pox   . Mitral valve prolapse    NO MR; no SBE prophylaxis needed  . PONV (postoperative nausea and vomiting)   . Pre-diabetes    highest A1c 6.4 %  . Preventative health care 12/21/2016  . Squamous cell carcinoma in situ of skin    Dr Syble Creek  . Vision impairment    limited vision in  right eye.   Past Surgical History:  Procedure Laterality Date  . CERVICAL CONE BIOPSY    . COLONOSCOPY  2003  . COLONOSCOPY  04/01/2011   [place] - repeat in 5 years  . COLONOSCOPY W/ POLYPECTOMY  2007   Dr Earlean Shawl  . LAMINECTOMY     1982  . OOPHORECTOMY Left 12/2011   Dr Ubaldo Glassing ; ovarian cyst  . RECTAL SURGERY  05/28/06   pre-canerous lesion removed in 2007; Dr Morton Stall, [place] [facility]  . rt foot morton's surgery    . TUBAL LIGATION     Family History  Problem Relation Age of Onset  . Diabetes Mother   . Dementia Mother   . Prostate cancer Father   . COPD Father   . Heart disease Father        CAD - used nitroglycerin tablets  . Diabetes Sister        obese  . Thyroid cancer Sister   . Diabetes Brother        not obese  . Lymphoma Brother        NHL  . Cancer Brother  non Hodgkin's Lymphoma  . Diabetes Maternal Grandmother        IDDM  . Diabetes Paternal Grandmother        IDDM  . Heart disease Paternal Grandmother   . Diabetes Maternal Aunt        X5 ; all IDDM  . Heart disease Maternal Aunt        "enlarged heart"  . Heart disease Maternal Uncle        "enlarged heart"  . Heart disease Paternal Grandfather        CAD - used nitroglycerin tablets  . Diabetes Brother   . Colon cancer Neg Hx   . Esophageal cancer Neg Hx   . Stomach cancer Neg Hx   . Stroke Neg Hx    Social History   Socioeconomic History  . Marital status: Single    Spouse name: Not on file  . Number of children: Not on file  . Years of education: Not on file  . Highest education level: Not on file  Occupational History  . Not on file  Social Needs  . Financial resource strain: Not on file  . Food insecurity:    Worry: Not on file    Inability: Not on file  . Transportation needs:    Medical: Not on file    Non-medical: Not on file  Tobacco Use  . Smoking status: Never Smoker  . Smokeless tobacco: Never Used  Substance and Sexual Activity  . Alcohol use: Yes    Comment: glass wine  once a month  . Drug use: No  . Sexual activity: Never    Partners: Male    Birth control/protection: Abstinence  Lifestyle  . Physical activity:    Days per week: Not on file    Minutes per session: Not on file  . Stress: Not on file  Relationships  . Social connections:    Talks on phone: Not on file    Gets together: Not on file    Attends religious service: Not on file    Active member of club or organization: Not on file    Attends meetings of clubs or organizations: Not on file    Relationship status: Not on file  Other Topics Concern  . Not on file  Social History Narrative   Lives alone   No dietary restrictions    Outpatient Encounter Medications as of 12/22/2017  Medication Sig  . Biotin 1 MG CAPS Take by mouth.  . Calcium 200 MG TABS Take by mouth. Tale [phone] mg daily  . cetirizine (ZYRTEC) 10 MG tablet Take 10 mg by mouth daily.  . Ferrous Fumarate (FERROCITE) 324 (106 Fe) MG TABS tablet Take 1 tablet (106 mg of iron total) by mouth daily.  . fluticasone (FLONASE) 50 MCG/ACT nasal spray Place 2 sprays into both nostrils daily.  Marland Kitchen glucose blood (ONETOUCH VERIO) test strip Use to test blood sugar once daily  . lamoTRIgine (LAMICTAL) 200 MG tablet Take 200 mg by mouth daily.  . naproxen (NAPROSYN) 500 MG tablet Take 500 mg by mouth as needed.  Marland Kitchen omega-3 acid ethyl esters (LOVAZA) 1 g capsule Take by mouth daily.  . ONE TOUCH LANCETS MISC Use to test blood sugar once daily  . TURMERIC PO Take by mouth.   No facility-administered encounter medications on file as of 12/22/2017.     Activities of Daily Living In your present state of health, do you have any difficulty performing the following activities: 12/21/2016 12/21/2016  Hearing? N N  Vision? N N  Comment - -  Difficulty concentrating or making decisions? N -  Walking or climbing stairs? N -  Dressing or bathing? N -  Doing errands, shopping? N -  Preparing Food and eating ? N -  Using the Toilet? N -    In the past six months, have you accidently leaked urine? N -  Do you have problems with loss of bowel control? N -  Managing your Medications? N -  Managing your Finances? N -  Housekeeping or managing your Housekeeping? N -  Some recent data might be hidden    Patient Care Team: Mosie Lukes, MD as PCP - General (Family Medicine) Gardiner Barefoot, DPM as Consulting Physician (Podiatry) Philemon Kingdom, MD as Consulting Physician (Internal Medicine) Milus Banister, MD as Consulting Physician (Gastroenterology) Kem Boroughs, [place] as Nurse Practitioner (Family Medicine) Eliseo Gum, MD as Consulting Physician (Psychiatry) Rolm Bookbinder, MD as Consulting Physician (Dermatology) Hayden Pedro, MD as Consulting Physician (Ophthalmology)    Assessment:   This is a routine wellness examination for Destiny Romero. Physical assessment deferred to PCP.   Exercise Activities and Dietary recommendations   Diet (meal preparation, eat out, water intake, caffeinated beverages, dairy products, fruits and vegetables): {Desc; diets:16563} Breakfast: Lunch:  Dinner:      Goals    None      Fall Risk Fall Risk  12/21/2016 09/13/2014  Falls in the past year? No Yes  Number falls in past yr: - 1  Injury with Fall? - Yes     Depression Screen PHQ 2/9 Scores 12/21/2016 09/13/2014  PHQ - 2 Score 0 0     Cognitive Function        Immunization History  Administered Date(s) Administered  . Hepatitis B 01/27/2013  . Influenza Split 06/28/2012  . Influenza, High Dose Seasonal PF 10/05/2016, 06/15/2017  . Influenza,inj,Quad PF,6+ Mos 07/25/2013, 06/14/2014, 07/11/2015  . Tdap 02/03/2013  . Zoster 04/22/2010  . Zoster Recombinat (Shingrix) 12/29/2016    Screening Tests Health Maintenance  Topic Date Due  . PNA vac Low Risk Adult (1 of 2 - PCV13) 08/09/2014  . OPHTHALMOLOGY EXAM  09/15/2015  . URINE MICROALBUMIN  07/10/2016  . FOOT EXAM  10/08/2017  . INFLUENZA VACCINE   04/07/2018  . HEMOGLOBIN A1C  04/18/2018  . MAMMOGRAM  07/13/2018  . COLONOSCOPY  09/15/2021  . TETANUS/TDAP  02/04/2023  . DEXA SCAN  Completed  . Hepatitis C Screening  Completed      Plan:   ***   I have personally reviewed and noted the following in the patient's chart:   . Medical and social history . Use of alcohol, tobacco or illicit drugs  . Current medications and supplements . Functional ability and status . Nutritional status . Physical activity . Advanced directives . List of other physicians . Hospitalizations, surgeries, and ER visits in previous 12 months . Vitals . Screenings to include cognitive, depression, and falls . Referrals and appointments  In addition, I have reviewed and discussed with patient certain preventive protocols, quality metrics, and best practice recommendations. A written personalized care plan for preventive services as well as general preventive health recommendations were provided to patient.     Shela Nevin, [place]  12/17/2017

## 2017-12-22 ENCOUNTER — Ambulatory Visit: Payer: Medicare HMO | Admitting: *Deleted

## 2017-12-23 DIAGNOSIS — H2513 Age-related nuclear cataract, bilateral: Secondary | ICD-10-CM | POA: Diagnosis not present

## 2017-12-23 DIAGNOSIS — E119 Type 2 diabetes mellitus without complications: Secondary | ICD-10-CM | POA: Diagnosis not present

## 2017-12-23 DIAGNOSIS — H35371 Puckering of macula, right eye: Secondary | ICD-10-CM | POA: Diagnosis not present

## 2017-12-23 LAB — HM DIABETES EYE EXAM

## 2018-01-31 ENCOUNTER — Encounter: Payer: Self-pay | Admitting: Internal Medicine

## 2018-02-01 ENCOUNTER — Other Ambulatory Visit: Payer: Self-pay | Admitting: Internal Medicine

## 2018-02-01 DIAGNOSIS — R69 Illness, unspecified: Secondary | ICD-10-CM | POA: Diagnosis not present

## 2018-02-01 MED ORDER — GLUCOSE BLOOD VI STRP: ORAL_STRIP | 3 refills | Status: DC

## 2018-02-03 DIAGNOSIS — R69 Illness, unspecified: Secondary | ICD-10-CM | POA: Diagnosis not present

## 2018-02-16 ENCOUNTER — Other Ambulatory Visit: Payer: Self-pay | Admitting: Family Medicine

## 2018-02-17 ENCOUNTER — Telehealth: Payer: Self-pay | Admitting: Family Medicine

## 2018-02-17 NOTE — Telephone Encounter (Signed)
Copied from [place] [id number]. Topic: Quick Communication - See Telephone Encounter >> Feb 17, 2018 10:28 AM Genella Rife H wrote: CRM for notification. See Telephone encounter for: 02/17/18.  Left voicemail appt needs to be rescheduled per pcp.

## 2018-03-09 NOTE — Progress Notes (Addendum)
Subjective:   Destiny Romero is a 67 y.o. female who presents for Medicare Annual (Subsequent) preventive examination.  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   Review of Systems: No ROS.  Medicare Wellness Visit. Additional risk factors are reflected in the social history. Cardiac Risk Factors include: advanced age (>17men, >73 women);dyslipidemia;diabetes mellitus Sleep patterns:   Sleeps at least 6 hrs. Feels rested. Home Safety/Smoke Alarms: Feels safe in home. Smoke alarms in place.  Living environment; residence and Firearm Safety: 1 story home. Lives alone.    Female:   Pap-  07/01/16     Mammo- utd      Dexa scan- utd       CCS-09/15/16. Recall 5 yrs Eye-Dr.Hutto yearly. Wears glasses.     Objective:     Vitals: BP 100/60 (BP Location: Left Arm, Patient Position: Sitting, Cuff Size: Normal)   Pulse 62   Ht 5\' 4"  (1.626 m)   Wt 164 lb 3.2 oz (74.5 kg)   LMP 04/07/2001 (Approximate)   SpO2 98%   BMI 28.18 kg/m   Body mass index is 28.18 kg/m.  Advanced Directives 03/15/2018 12/21/2016 09/15/2016 09/06/2014  Does Patient Have a Medical Advance Directive? Yes Yes Yes Yes  Type of Paramedic of [place];Living will [place];Living will - Living will;Out of facility DNR (pink MOST or yellow form)  Copy of Pump Back in Chart? No - copy requested No - copy requested - -    Tobacco Social History   Tobacco Use  Smoking Status Never Smoker  Smokeless Tobacco Never Used     Counseling given: Not Answered   Clinical Intake: Pain : No/denies pain    Past Medical History:  Diagnosis Date  . Allergy   . Anal lesion 2007  . Anemia   . Anesthesia complication    Per pt/ sensitive to sedation!  . Central retinal artery occlusion 06/08/2016   limited vision right eye.  Marland Kitchen Cervical dysplasia 04/1991   Dr Rica Koyanagi - CIN I, cone and freeze in 1990s with normal paps since  . Constipation  06/08/2016   moves bowels every 3 rd day   . Decreased visual acuity 06/08/2016   Retinal bleed right eye 2017  . Depression   . Fatigue 06/27/2017  . H/O measles   . H/O mumps   . History of chicken pox   . Mitral valve prolapse    NO MR; no SBE prophylaxis needed  . PONV (postoperative nausea and vomiting)   . Pre-diabetes    highest A1c 6.4 %  . Preventative health care 12/21/2016  . Squamous cell carcinoma in situ of skin    Dr [place]  . Vision impairment    limited vision in right eye.   Past Surgical History:  Procedure Laterality Date  . CERVICAL CONE BIOPSY    . COLONOSCOPY  2003  . COLONOSCOPY  04/01/2011    - repeat in 5 years  . COLONOSCOPY W/ POLYPECTOMY  2007   Dr Earlean Shawl  . LAMINECTOMY     1982  . OOPHORECTOMY Left 12/2011   Dr Ubaldo Glassing ; ovarian cyst  . RECTAL SURGERY  05/28/06   pre-canerous lesion removed in 2007; Dr Morton Stall, [facility]  . rt foot morton's surgery    . TUBAL LIGATION     Family History  Problem Relation Age of Onset  . Diabetes Mother   . Dementia Mother   . Prostate cancer  Father   . COPD Father   . Heart disease Father        CAD - used nitroglycerin tablets  . Diabetes Sister        obese  . Thyroid cancer Sister   . Diabetes Brother        not obese  . Lymphoma Brother        NHL  . Cancer Brother        non Hodgkin's Lymphoma  . Diabetes Maternal Grandmother        IDDM  . Diabetes Paternal Grandmother        IDDM  . Heart disease Paternal Grandmother   . Diabetes Maternal Aunt        X5 ; all IDDM  . Heart disease Maternal Aunt        "enlarged heart"  . Heart disease Maternal Uncle        "enlarged heart"  . Heart disease Paternal Grandfather        CAD - used nitroglycerin tablets  . Diabetes Brother   . Colon cancer Neg Hx   . Esophageal cancer Neg Hx   . Stomach cancer Neg Hx   . Stroke Neg Hx    Social History   Socioeconomic History  . Marital status: Single    Spouse name: Not on file  . Number of  children: Not on file  . Years of education: Not on file  . Highest education level: Not on file  Occupational History  . Not on file  Social Needs  . Financial resource strain: Not on file  . Food insecurity:    Worry: Not on file    Inability: Not on file  . Transportation needs:    Medical: Not on file    Non-medical: Not on file  Tobacco Use  . Smoking status: Never Smoker  . Smokeless tobacco: Never Used  Substance and Sexual Activity  . Alcohol use: Yes    Comment: glass wine once a month  . Drug use: No  . Sexual activity: Never    Partners: Male    Birth control/protection: Abstinence  Lifestyle  . Physical activity:    Days per week: Not on file    Minutes per session: Not on file  . Stress: Not on file  Relationships  . Social connections:    Talks on phone: Not on file    Gets together: Not on file    Attends religious service: Not on file    Active member of club or organization: Not on file    Attends meetings of clubs or organizations: Not on file    Relationship status: Not on file  Other Topics Concern  . Not on file  Social History Narrative   Lives alone   No dietary restrictions    Outpatient Encounter Medications as of 03/15/2018  Medication Sig  . Calcium 200 MG TABS Take by mouth. Tale [id number] mg daily  . cetirizine (ZYRTEC) 10 MG tablet Take 10 mg by mouth daily.  . Cholecalciferol (VITAMIN D) 2000 units CAPS   . Evening Primrose Oil 1000 MG CAPS   . FERROCITE 324 MG TABS tablet TAKE 1 TABLET (106 MG OF IRON TOTAL) BY MOUTH DAILY.  Marland Kitchen glucose blood (ONETOUCH VERIO) test strip Use to test blood sugar 3x daily  . lamoTRIgine (LAMICTAL) 200 MG tablet Take 200 mg by mouth daily.  . naproxen (NAPROSYN) 500 MG tablet Take 500 mg by mouth as needed.  Marland Kitchen  omega-3 acid ethyl esters (LOVAZA) 1 g capsule Take by mouth daily.  . ONE TOUCH LANCETS MISC Use to test blood sugar once daily  . TURMERIC PO Take by mouth.  . [DISCONTINUED] Biotin 1 MG CAPS Take  by mouth.  . [DISCONTINUED] fluticasone (FLONASE) 50 MCG/ACT nasal spray Place 2 sprays into both nostrils daily.   No facility-administered encounter medications on file as of 03/15/2018.     Activities of Daily Living In your present state of health, do you have any difficulty performing the following activities: 03/15/2018  Hearing? N  Vision? N  Difficulty concentrating or making decisions? N  Walking or climbing stairs? N  Dressing or bathing? N  Doing errands, shopping? N  Preparing Food and eating ? N  Using the Toilet? N  In the past six months, have you accidently leaked urine? N  Do you have problems with loss of bowel control? N  Managing your Medications? N  Managing your Finances? N  Housekeeping or managing your Housekeeping? N  Some recent data might be hidden    Patient Care Team: Mosie Lukes, MD as PCP - General (Family Medicine) Philemon Kingdom, MD as Consulting Physician (Internal Medicine) Milus Banister, MD as Consulting Physician (Gastroenterology) Eliseo Gum, MD as Consulting Physician (Psychiatry) Rolm Bookbinder, MD as Consulting Physician (Dermatology) Hayden Pedro, MD as Consulting Physician (Ophthalmology)    Assessment:   This is a routine wellness examination for Lilia. Physical assessment deferred to PCP.  Exercise Activities and Dietary recommendations Current Exercise Habits: Home exercise routine;Structured exercise class, Type of exercise: treadmill;strength training/weights, Time (Minutes): 60, Frequency (Times/Week): 3, Weekly Exercise (Minutes/Week): 180, Intensity: Mild, Exercise limited by: None identified Diet (meal preparation, eat out, water intake, caffeinated beverages, dairy products, fruits and vegetables): in general, a "healthy" diet  , well balanced. Pt states weight watchers this week.       Goals    . Weight (lb) < 145 lb (65.8 kg)       Fall Risk Fall Risk  03/15/2018 12/21/2016 09/13/2014  Falls in the past  year? Yes No Yes  Number falls in past yr: 2 or more - 1  Injury with Fall? No - Yes  Risk for fall due to : History of fall(s) - -  Follow up Education provided;Falls prevention discussed - -    Depression Screen PHQ 2/9 Scores 03/15/2018 12/21/2016 09/13/2014  PHQ - 2 Score 0 0 0     Cognitive Function Ad8 score reviewed for issues:  Issues making decisions:no  Less interest in hobbies / activities:no  Repeats questions, stories (family complaining):no  Trouble using ordinary gadgets (microwave, computer, phone):no  Forgets the month or year: no  Mismanaging finances: no  Remembering appts:no  Daily problems with thinking and/or memory:no Ad8 score is=0        Immunization History  Administered Date(s) Administered  . Hepatitis B 01/27/2013  . Influenza Split 06/28/2012  . Influenza, High Dose Seasonal PF 10/05/2016, 06/15/2017  . Influenza,inj,Quad PF,6+ Mos 07/25/2013, 06/14/2014, 07/11/2015  . Tdap 02/03/2013  . Zoster 04/22/2010  . Zoster Recombinat (Shingrix) 12/29/2016    Screening Tests Health Maintenance  Topic Date Due  . URINE MICROALBUMIN  07/10/2016  . PNA vac Low Risk Adult (1 of 2 - PCV13) 04/08/2019 (Originally 08/09/2014)  . INFLUENZA VACCINE  04/07/2018  . HEMOGLOBIN A1C  04/18/2018  . MAMMOGRAM  07/13/2018  . OPHTHALMOLOGY EXAM  12/24/2018  . FOOT EXAM  03/16/2019  . COLONOSCOPY  09/15/2021  . TETANUS/TDAP  02/04/2023  . DEXA SCAN  Completed  . Hepatitis C Screening  Completed       Plan:    Please schedule your next medicare wellness visit with me in 1 yr.  Continue to eat heart healthy diet (full of fruits, vegetables, whole grains, lean protein, water--limit salt, fat, and sugar intake) and increase physical activity as tolerated.  Continue doing brain stimulating activities (puzzles, reading, adult coloring books, staying active) to keep memory sharp.   Bring a copy of your living will and/or healthcare power of attorney to your  next office visit.    I have personally reviewed and noted the following in the patient's chart:   . Medical and social history . Use of alcohol, tobacco or illicit drugs  . Current medications and supplements . Functional ability and status . Nutritional status . Physical activity . Advanced directives . List of other physicians . Hospitalizations, surgeries, and ER visits in previous 12 months . Vitals . Screenings to include cognitive, depression, and falls . Referrals and appointments  In addition, I have reviewed and discussed with patient certain preventive protocols, quality metrics, and best practice recommendations. A written personalized care plan for preventive services as well as general preventive health recommendations were provided to patient.     Naaman Plummer [place], [place]  03/15/2018   Kathlene November, MD

## 2018-03-15 ENCOUNTER — Ambulatory Visit (INDEPENDENT_AMBULATORY_CARE_PROVIDER_SITE_OTHER): Payer: Medicare HMO | Admitting: *Deleted

## 2018-03-15 ENCOUNTER — Encounter: Payer: Self-pay | Admitting: *Deleted

## 2018-03-15 VITALS — BP 100/60 | HR 62 | Ht 64.0 in | Wt 164.2 lb

## 2018-03-15 DIAGNOSIS — Z0000 Encounter for general adult medical examination without abnormal findings: Secondary | ICD-10-CM | POA: Diagnosis not present

## 2018-03-15 DIAGNOSIS — H2513 Age-related nuclear cataract, bilateral: Secondary | ICD-10-CM | POA: Diagnosis not present

## 2018-03-15 DIAGNOSIS — H2511 Age-related nuclear cataract, right eye: Secondary | ICD-10-CM | POA: Diagnosis not present

## 2018-03-15 DIAGNOSIS — E119 Type 2 diabetes mellitus without complications: Secondary | ICD-10-CM | POA: Diagnosis not present

## 2018-03-15 DIAGNOSIS — H35371 Puckering of macula, right eye: Secondary | ICD-10-CM | POA: Diagnosis not present

## 2018-03-15 NOTE — Patient Instructions (Signed)
Please schedule your next medicare wellness visit with me in 1 yr.  Continue to eat heart healthy diet (full of fruits, vegetables, whole grains, lean protein, water--limit salt, fat, and sugar intake) and increase physical activity as tolerated.  Continue doing brain stimulating activities (puzzles, reading, adult coloring books, staying active) to keep memory sharp.   Bring a copy of your living will and/or healthcare power of attorney to your next office visit.   Destiny Romero , Thank you for taking time to come for your Medicare Wellness Visit. I appreciate your ongoing commitment to your health goals. Please review the following plan we discussed and let me know if I can assist you in the future.   These are the goals we discussed: Goals    . Weight (lb) < 145 lb (65.8 kg)       This is a list of the screening recommended for you and due dates:  Health Maintenance  Topic Date Due  . Urine Protein Check  07/10/2016  . Pneumonia vaccines (1 of 2 - PCV13) 04/08/2019*  . Flu Shot  04/07/2018  . Hemoglobin A1C  04/18/2018  . Mammogram  07/13/2018  . Eye exam for diabetics  12/24/2018  . Complete foot exam   03/16/2019  . Colon Cancer Screening  09/15/2021  . Tetanus Vaccine  02/04/2023  . DEXA scan (bone density measurement)  Completed  .  Hepatitis C: One time screening is recommended by Center for Disease Control  (CDC) for  adults born from [birth year] through 1967.   Completed  *Topic was postponed. The date shown is not the original due date.    Health Maintenance for Postmenopausal Women Menopause is a normal process in which your reproductive ability comes to an end. This process happens gradually over a span of months to years, usually between the ages of 45 and 35. Menopause is complete when you have missed 12 consecutive menstrual periods. It is important to talk with your health care provider about some of the most common conditions that affect postmenopausal women, such as  heart disease, cancer, and bone loss (osteoporosis). Adopting a healthy lifestyle and getting preventive care can help to promote your health and wellness. Those actions can also lower your chances of developing some of these common conditions. What should I know about menopause? During menopause, you may experience a number of symptoms, such as:  Moderate-to-severe hot flashes.  Night sweats.  Decrease in sex drive.  Mood swings.  Headaches.  Tiredness.  Irritability.  Memory problems.  Insomnia.  Choosing to treat or not to treat menopausal changes is an individual decision that you make with your health care provider. What should I know about hormone replacement therapy and supplements? Hormone therapy products are effective for treating symptoms that are associated with menopause, such as hot flashes and night sweats. Hormone replacement carries certain risks, especially as you become older. If you are thinking about using estrogen or estrogen with progestin treatments, discuss the benefits and risks with your health care provider. What should I know about heart disease and stroke? Heart disease, heart attack, and stroke become more likely as you age. This may be due, in part, to the hormonal changes that your body experiences during menopause. These can affect how your body processes dietary fats, triglycerides, and cholesterol. Heart attack and stroke are both medical emergencies. There are many things that you can do to help prevent heart disease and stroke:  Have your blood pressure checked at least every  1-2 years. High blood pressure causes heart disease and increases the risk of stroke.  If you are 30-63 years old, ask your health care provider if you should take aspirin to prevent a heart attack or a stroke.  Do not use any tobacco products, including cigarettes, chewing tobacco, or electronic cigarettes. If you need help quitting, ask your health care provider.  It is  important to eat a healthy diet and maintain a healthy weight. ? Be sure to include plenty of vegetables, fruits, low-fat dairy products, and lean protein. ? Avoid eating foods that are high in solid fats, added sugars, or salt (sodium).  Get regular exercise. This is one of the most important things that you can do for your health. ? Try to exercise for at least 150 minutes each week. The type of exercise that you do should increase your heart rate and make you sweat. This is known as moderate-intensity exercise. ? Try to do strengthening exercises at least twice each week. Do these in addition to the moderate-intensity exercise.  Know your numbers.Ask your health care provider to check your cholesterol and your blood glucose. Continue to have your blood tested as directed by your health care provider.  What should I know about cancer screening? There are several types of cancer. Take the following steps to reduce your risk and to catch any cancer development as early as possible. Breast Cancer  Practice breast self-awareness. ? This means understanding how your breasts normally appear and feel. ? It also means doing regular breast self-exams. Let your health care provider know about any changes, no matter how small.  If you are 30 or older, have a clinician do a breast exam (clinical breast exam or CBE) every year. Depending on your age, family history, and medical history, it may be recommended that you also have a yearly breast X-ray (mammogram).  If you have a family history of breast cancer, talk with your health care provider about genetic screening.  If you are at high risk for breast cancer, talk with your health care provider about having an MRI and a mammogram every year.  Breast cancer (BRCA) gene test is recommended for women who have family members with BRCA-related cancers. Results of the assessment will determine the need for genetic counseling and BRCA1 and for BRCA2  testing. BRCA-related cancers include these types: ? Breast. This occurs in males or females. ? Ovarian. ? Tubal. This may also be called fallopian tube cancer. ? Cancer of the abdominal or pelvic lining (peritoneal cancer). ? Prostate. ? Pancreatic.  Cervical, Uterine, and Ovarian Cancer Your health care provider may recommend that you be screened regularly for cancer of the pelvic organs. These include your ovaries, uterus, and vagina. This screening involves a pelvic exam, which includes checking for microscopic changes to the surface of your cervix (Pap test).  For women ages 21-65, health care providers may recommend a pelvic exam and a Pap test every three years. For women ages 76-65, they may recommend the Pap test and pelvic exam, combined with testing for human papilloma virus (HPV), every five years. Some types of HPV increase your risk of cervical cancer. Testing for HPV may also be done on women of any age who have unclear Pap test results.  Other health care providers may not recommend any screening for nonpregnant women who are considered low risk for pelvic cancer and have no symptoms. Ask your health care provider if a screening pelvic exam is right for you.  If you have had past treatment for cervical cancer or a condition that could lead to cancer, you need Pap tests and screening for cancer for at least 20 years after your treatment. If Pap tests have been discontinued for you, your risk factors (such as having a new sexual partner) need to be reassessed to determine if you should start having screenings again. Some women have medical problems that increase the chance of getting cervical cancer. In these cases, your health care provider may recommend that you have screening and Pap tests more often.  If you have a family history of uterine cancer or ovarian cancer, talk with your health care provider about genetic screening.  If you have vaginal bleeding after reaching  menopause, tell your health care provider.  There are currently no reliable tests available to screen for ovarian cancer.  Lung Cancer Lung cancer screening is recommended for adults 6-47 years old who are at high risk for lung cancer because of a history of smoking. A yearly low-dose CT scan of the lungs is recommended if you:  Currently smoke.  Have a history of at least 30 pack-years of smoking and you currently smoke or have quit within the past 15 years. A pack-year is smoking an average of one pack of cigarettes per day for one year.  Yearly screening should:  Continue until it has been 15 years since you quit.  Stop if you develop a health problem that would prevent you from having lung cancer treatment.  Colorectal Cancer  This type of cancer can be detected and can often be prevented.  Routine colorectal cancer screening usually begins at age 70 and continues through age 6.  If you have risk factors for colon cancer, your health care provider may recommend that you be screened at an earlier age.  If you have a family history of colorectal cancer, talk with your health care provider about genetic screening.  Your health care provider may also recommend using home test kits to check for hidden blood in your stool.  A small camera at the end of a tube can be used to examine your colon directly (sigmoidoscopy or colonoscopy). This is done to check for the earliest forms of colorectal cancer.  Direct examination of the colon should be repeated every 5-10 years until age 36. However, if early forms of precancerous polyps or small growths are found or if you have a family history or genetic risk for colorectal cancer, you may need to be screened more often.  Skin Cancer  Check your skin from head to toe regularly.  Monitor any moles. Be sure to tell your health care provider: ? About any new moles or changes in moles, especially if there is a change in a mole's shape or  color. ? If you have a mole that is larger than the size of a pencil eraser.  If any of your family members has a history of skin cancer, especially at a young age, talk with your health care provider about genetic screening.  Always use sunscreen. Apply sunscreen liberally and repeatedly throughout the day.  Whenever you are outside, protect yourself by wearing long sleeves, pants, a wide-brimmed hat, and sunglasses.  What should I know about osteoporosis? Osteoporosis is a condition in which bone destruction happens more quickly than new bone creation. After menopause, you may be at an increased risk for osteoporosis. To help prevent osteoporosis or the bone fractures that can happen because of osteoporosis, the following is  recommended:  If you are 48-58 years old, get at least 1,000 mg of calcium and at least 600 mg of vitamin D per day.  If you are older than age 23 but younger than age 74, get at least 1,200 mg of calcium and at least 600 mg of vitamin D per day.  If you are older than age 35, get at least 1,200 mg of calcium and at least 800 mg of vitamin D per day.  Smoking and excessive alcohol intake increase the risk of osteoporosis. Eat foods that are rich in calcium and vitamin D, and do weight-bearing exercises several times each week as directed by your health care provider. What should I know about how menopause affects my mental health? Depression may occur at any age, but it is more common as you become older. Common symptoms of depression include:  Low or sad mood.  Changes in sleep patterns.  Changes in appetite or eating patterns.  Feeling an overall lack of motivation or enjoyment of activities that you previously enjoyed.  Frequent crying spells.  Talk with your health care provider if you think that you are experiencing depression. What should I know about immunizations? It is important that you get and maintain your immunizations. These include:  Tetanus,  diphtheria, and pertussis (Tdap) booster vaccine.  Influenza every year before the flu season begins.  Pneumonia vaccine.  Shingles vaccine.  Your health care provider may also recommend other immunizations. This information is not intended to replace advice given to you by your health care provider. Make sure you discuss any questions you have with your health care provider. Document Released: 10/16/2005 Document Revised: 03/13/2016 Document Reviewed: 05/28/2015 Elsevier Interactive Patient Education  2018 [employer].

## 2018-03-23 DIAGNOSIS — S0502XA Injury of conjunctiva and corneal abrasion without foreign body, left eye, initial encounter: Secondary | ICD-10-CM | POA: Diagnosis not present

## 2018-03-24 DIAGNOSIS — S0502XD Injury of conjunctiva and corneal abrasion without foreign body, left eye, subsequent encounter: Secondary | ICD-10-CM | POA: Diagnosis not present

## 2018-04-04 ENCOUNTER — Encounter: Payer: Medicare HMO | Admitting: Family Medicine

## 2018-04-26 ENCOUNTER — Ambulatory Visit: Payer: Medicare HMO | Admitting: Internal Medicine

## 2018-05-02 DIAGNOSIS — R69 Illness, unspecified: Secondary | ICD-10-CM | POA: Diagnosis not present

## 2018-05-03 ENCOUNTER — Ambulatory Visit (INDEPENDENT_AMBULATORY_CARE_PROVIDER_SITE_OTHER): Payer: Medicare HMO | Admitting: Family Medicine

## 2018-05-03 ENCOUNTER — Encounter: Payer: Self-pay | Admitting: Family Medicine

## 2018-05-03 VITALS — BP 110/68 | HR 55 | Temp 98.1°F | Resp 18 | Ht 64.5 in | Wt 156.0 lb

## 2018-05-03 DIAGNOSIS — Z0000 Encounter for general adult medical examination without abnormal findings: Secondary | ICD-10-CM

## 2018-05-03 DIAGNOSIS — M79642 Pain in left hand: Secondary | ICD-10-CM | POA: Diagnosis not present

## 2018-05-03 DIAGNOSIS — M8580 Other specified disorders of bone density and structure, unspecified site: Secondary | ICD-10-CM | POA: Diagnosis not present

## 2018-05-03 DIAGNOSIS — E119 Type 2 diabetes mellitus without complications: Secondary | ICD-10-CM

## 2018-05-03 DIAGNOSIS — E785 Hyperlipidemia, unspecified: Secondary | ICD-10-CM

## 2018-05-03 DIAGNOSIS — D649 Anemia, unspecified: Secondary | ICD-10-CM | POA: Diagnosis not present

## 2018-05-03 DIAGNOSIS — R350 Frequency of micturition: Secondary | ICD-10-CM

## 2018-05-03 NOTE — Assessment & Plan Note (Signed)
Encouraged heart healthy diet, increase exercise, avoid trans fats, consider a krill oil cap daily 

## 2018-05-03 NOTE — Patient Instructions (Signed)
Preventive Care 69 Years and Older, Female Preventive care refers to lifestyle choices and visits with your health care provider that can promote health and wellness. What does preventive care include?  A yearly physical exam. This is also called an annual well check.  Dental exams once or twice a year.  Routine eye exams. Ask your health care provider how often you should have your eyes checked.  Personal lifestyle choices, including: ? Daily care of your teeth and gums. ? Regular physical activity. ? Eating a healthy diet. ? Avoiding tobacco and drug use. ? Limiting alcohol use. ? Practicing safe sex. ? Taking low-dose aspirin every day. ? Taking vitamin and mineral supplements as recommended by your health care provider. What happens during an annual well check? The services and screenings done by your health care provider during your annual well check will depend on your age, overall health, lifestyle risk factors, and family history of disease. Counseling Your health care provider may ask you questions about your:  Alcohol use.  Tobacco use.  Drug use.  Emotional well-being.  Home and relationship well-being.  Sexual activity.  Eating habits.  History of falls.  Memory and ability to understand (cognition).  Work and work environment.  Reproductive health.  Screening You may have the following tests or measurements:  Height, weight, and BMI.  Blood pressure.  Lipid and cholesterol levels. These may be checked every 5 years, or more frequently if you are over 50 years old.  Skin check.  Lung cancer screening. You may have this screening every year starting at age 55 if you have a 30-pack-year history of smoking and currently smoke or have quit within the past 15 years.  Fecal occult blood test (FOBT) of the stool. You may have this test every year starting at age 50.  Flexible sigmoidoscopy or colonoscopy. You may have a sigmoidoscopy every 5 years or  a colonoscopy every 10 years starting at age 50.  Hepatitis C blood test.  Hepatitis B blood test.  Sexually transmitted disease (STD) testing.  Diabetes screening. This is done by checking your blood sugar (glucose) after you have not eaten for a while (fasting). You may have this done every 1-3 years.  Bone density scan. This is done to screen for osteoporosis. You may have this done starting at age 69.  Mammogram. This may be done every 1-2 years. Talk to your health care provider about how often you should have regular mammograms.  Talk with your health care provider about your test results, treatment options, and if necessary, the need for more tests. Vaccines Your health care provider may recommend certain vaccines, such as:  Influenza vaccine. This is recommended every year.  Tetanus, diphtheria, and acellular pertussis (Tdap, Td) vaccine. You may need a Td booster every 10 years.  Varicella vaccine. You may need this if you have not been vaccinated.  Zoster vaccine. You may need this after age 60.  Measles, mumps, and rubella (MMR) vaccine. You may need at least one dose of MMR if you were born in 1957 or later. You may also need a second dose.  Pneumococcal 13-valent conjugate (PCV13) vaccine. One dose is recommended after age 69.  Pneumococcal polysaccharide (PPSV23) vaccine. One dose is recommended after age 69.  Meningococcal vaccine. You may need this if you have certain conditions.  Hepatitis A vaccine. You may need this if you have certain conditions or if you travel or work in places where you may be exposed to hepatitis   A.  Hepatitis B vaccine. You may need this if you have certain conditions or if you travel or work in places where you may be exposed to hepatitis B.  Haemophilus influenzae type b (Hib) vaccine. You may need this if you have certain conditions.  Talk to your health care provider about which screenings and vaccines you need and how often you  need them. This information is not intended to replace advice given to you by your health care provider. Make sure you discuss any questions you have with your health care provider. Document Released: 09/20/2015 Document Revised: 05/13/2016 Document Reviewed: 06/25/2015 Elsevier Interactive Patient Education  2018 [employer] Inc.  

## 2018-05-03 NOTE — Assessment & Plan Note (Signed)
hgba1c acceptable, minimize simple carbs. Increase exercise as tolerated.  

## 2018-05-03 NOTE — Assessment & Plan Note (Signed)
Urinalysis and culture today. 

## 2018-05-03 NOTE — Assessment & Plan Note (Signed)
Increase leafy greens, consider increased lean red meat and using cast iron cookware. Continue to monitor, report any concerns 

## 2018-05-04 LAB — COMPREHENSIVE METABOLIC PANEL
ALT: 21 U/L (ref 0–35)
AST: 21 U/L (ref 0–37)
Albumin: 4.6 g/dL (ref 3.5–5.2)
Alkaline Phosphatase: 54 U/L (ref 39–117)
BUN: 15 mg/dL (ref 6–23)
CHLORIDE: 103 meq/L (ref 96–112)
CO2: 29 meq/L (ref 19–32)
CREATININE: 1.14 mg/dL (ref 0.40–1.20)
Calcium: 9.9 mg/dL (ref 8.4–10.5)
GFR: 50.27 mL/min — ABNORMAL LOW (ref >60.00)
Glucose, Bld: 98 mg/dL (ref 70–99)
POTASSIUM: 4.7 meq/L (ref 3.5–5.1)
SODIUM: 140 meq/L (ref 135–145)
Total Bilirubin: 1 mg/dL (ref 0.2–1.2)
Total Protein: 7.2 g/dL (ref 6.0–8.3)

## 2018-05-04 LAB — LIPID PANEL
CHOL/HDL RATIO: 4
Cholesterol: 277 mg/dL — ABNORMAL HIGH (ref 0–200)
HDL: 69.8 mg/dL (ref >39.00)
LDL CALC: 183 mg/dL — AB (ref 0–99)
NonHDL: 207.53
Triglycerides: 122 mg/dL (ref 0.0–149.0)
VLDL: 24.4 mg/dL (ref 0.0–40.0)

## 2018-05-04 LAB — CBC WITH DIFFERENTIAL/PLATELET
BASOS PCT: 0.8 % (ref 0.0–3.0)
Basophils Absolute: 0.1 10*3/uL (ref 0.0–0.1)
EOS ABS: 0.5 10*3/uL (ref 0.0–0.7)
Eosinophils Relative: 6 % — ABNORMAL HIGH (ref 0.0–5.0)
HCT: 39.7 % (ref 36.0–46.0)
Hemoglobin: 13.4 g/dL (ref 12.0–15.0)
LYMPHS ABS: 3.9 10*3/uL (ref 0.7–4.0)
Lymphocytes Relative: 49.2 % — ABNORMAL HIGH (ref 12.0–46.0)
MCHC: 33.8 g/dL (ref 30.0–36.0)
MCV: 88.5 fl (ref 78.0–100.0)
MONO ABS: 0.6 10*3/uL (ref 0.1–1.0)
Monocytes Relative: 7 % (ref 3.0–12.0)
NEUTROS ABS: 2.9 10*3/uL (ref 1.4–7.7)
NEUTROS PCT: 37 % — AB (ref 43.0–77.0)
PLATELETS: 268 10*3/uL (ref 150.0–400.0)
RBC: 4.48 Mil/uL (ref 3.87–5.11)
RDW: 13.8 % (ref 11.5–15.5)
WBC: 8 10*3/uL (ref 4.0–10.5)

## 2018-05-04 LAB — SEDIMENTATION RATE: Sed Rate: 10 mm/hr (ref 0–30)

## 2018-05-04 LAB — TSH: TSH: 1.15 u[IU]/mL (ref 0.35–4.50)

## 2018-05-04 LAB — T4, FREE: FREE T4: 0.79 ng/dL (ref 0.60–1.60)

## 2018-05-04 LAB — HEMOGLOBIN A1C: Hgb A1c MFr Bld: 6.9 % — ABNORMAL HIGH (ref 4.6–6.5)

## 2018-05-09 DIAGNOSIS — M79642 Pain in left hand: Secondary | ICD-10-CM | POA: Insufficient documentation

## 2018-05-09 NOTE — Progress Notes (Signed)
Subjective:    Patient ID: Destiny Romero, female    DOB: 05-23-1951, 67 y.o.   MRN: [id number]  No chief complaint on file.   HPI Patient is in today for annual preventative exam and follow up on chronic medical concerns including hyperlipidemia, osteopenia and diabetes. No polyuria or polydipsia. No recent febrile illness or hospitalizations. Is trying to eat well and stays active. She is noting some recent trouble with her ight eye pain and is following with opthamology. No pain presently. Also notes some left hand pain in her 4th finger has been present for several months. No trauma o redness. Denies CP/palp/SOB/HA/congestion/fevers/GI or GU c/o. Taking meds as prescribed  Past Medical History:  Diagnosis Date  . Allergy   . Anal lesion 2007  . Anemia   . Anesthesia complication    Per pt/ sensitive to sedation!  . Central retinal artery occlusion 06/08/2016   limited vision right eye.  Marland Kitchen Cervical dysplasia 04/1991   Dr Rica Koyanagi - CIN I, cone and freeze in 1990s with normal paps since  . Constipation 06/08/2016   moves bowels every 3 rd day   . Decreased visual acuity 06/08/2016   Retinal bleed right eye 2017  . Depression   . Fatigue 06/27/2017  . H/O measles   . H/O mumps   . History of chicken pox   . Mitral valve prolapse    NO MR; no SBE prophylaxis needed  . PONV (postoperative nausea and vomiting)   . Pre-diabetes    highest A1c 6.4 %  . Preventative health care 12/21/2016  . Squamous cell carcinoma in situ of skin    Dr [place]  . Vision impairment    limited vision in right eye.    Past Surgical History:  Procedure Laterality Date  . CERVICAL CONE BIOPSY    . COLONOSCOPY  2003  . COLONOSCOPY  04/01/2011   [place] - repeat in 5 years  . COLONOSCOPY W/ POLYPECTOMY  2007   Dr Earlean Shawl  . LAMINECTOMY     1982  . OOPHORECTOMY Left 12/2011   Dr Ubaldo Glassing ; ovarian cyst  . RECTAL SURGERY  05/28/06   pre-canerous lesion removed in 2007; Dr Morton Stall, [facility]  . rt foot  mort[facility]'s surgery    . TUBAL LIGATION      Family History  Problem Relation Age of Onset  . Diabetes Mother   . Dementia Mother   . Prostate cancer Father   . COPD Father   . Heart disease Father        CAD - used nitroglycerin tablets  . Diabetes Sister        obese  . Thyroid cancer Sister   . Diabetes Brother        not obese  . Lymphoma Brother        NHL  . Cancer Brother        non Hodgkin's Lymphoma  . Diabetes Maternal Grandmother        IDDM  . Diabetes Paternal Grandmother        IDDM  . Heart disease Paternal Grandmother   . Diabetes Maternal Aunt        X5 ; all IDDM  . Heart disease Maternal Aunt        "enlarged heart"  . Heart disease Maternal Uncle        "enlarged heart"  . Heart disease Paternal Grandfather        CAD - used nitroglycerin tablets  .  Diabetes Brother   . Colon cancer Neg Hx   . Esophageal cancer Neg Hx   . Stomach cancer Neg Hx   . Stroke Neg Hx     Social History   Socioeconomic History  . Marital status: Single    Spouse name: Not on file  . Number of children: Not on file  . Years of education: Not on file  . Highest education level: Not on file  Occupational History  . Not on file  Social Needs  . Financial resource strain: Not on file  . Food insecurity:    Worry: Not on file    Inability: Not on file  . Transportation needs:    Medical: Not on file    Non-medical: Not on file  Tobacco Use  . Smoking status: Never Smoker  . Smokeless tobacco: Never Used  Substance and Sexual Activity  . Alcohol use: Yes    Comment: glass wine once a month  . Drug use: No  . Sexual activity: Never    Partners: Male    Birth control/protection: Abstinence  Lifestyle  . Physical activity:    Days per week: Not on file    Minutes per session: Not on file  . Stress: Not on file  Relationships  . Social connections:    Talks on phone: Not on file    Gets together: Not on file    Attends religious service: Not on file     Active member of club or organization: Not on file    Attends meetings of clubs or organizations: Not on file    Relationship status: Not on file  . Intimate partner violence:    Fear of current or ex partner: Not on file    Emotionally abused: Not on file    Physically abused: Not on file    Forced sexual activity: Not on file  Other Topics Concern  . Not on file  Social History Narrative   Lives alone   No dietary restrictions    Outpatient Medications Prior to Visit  Medication Sig Dispense Refill  . Calcium 200 MG TABS Take by mouth. Tale [phone] mg daily    . cetirizine (ZYRTEC) 10 MG tablet Take 10 mg by mouth daily.    . Cholecalciferol (VITAMIN D) 2000 units CAPS     . Evening Primrose Oil 1000 MG CAPS     . FERROCITE 324 MG TABS tablet TAKE 1 TABLET (106 MG OF IRON TOTAL) BY MOUTH DAILY. 90 tablet 0  . glucose blood (ONETOUCH VERIO) test strip Use to test blood sugar 3x daily 300 each 3  . lamoTRIgine (LAMICTAL) 200 MG tablet Take 200 mg by mouth daily.    . naproxen (NAPROSYN) 500 MG tablet Take 500 mg by mouth as needed.    Marland Kitchen omega-3 acid ethyl esters (LOVAZA) 1 g capsule Take by mouth daily.    . ONE TOUCH LANCETS MISC Use to test blood sugar once daily 100 each 1  . TURMERIC PO Take by mouth.     No facility-administered medications prior to visit.     Allergies  Allergen Reactions  . Cortisone Palpitations and Other (See Comments)    hyperactivity  . Pseudoephedrine     tachycardia  . Metformin And Related     04/15/15 caused dizziness  . Shellfish Allergy Other (See Comments)    [other]    Review of Systems  Constitutional: Negative for chills, fever and malaise/fatigue.  HENT: Negative for congestion and  hearing loss.   Eyes: Negative for discharge.  Respiratory: Negative for cough, sputum production and shortness of breath.   Cardiovascular: Negative for chest pain, palpitations and leg swelling.  Gastrointestinal: Negative for abdominal pain, blood  in stool, constipation, diarrhea, heartburn, nausea and vomiting.  Genitourinary: Negative for dysuria, frequency, hematuria and urgency.  Musculoskeletal: Positive for joint pain. Negative for back pain, falls and myalgias.  Skin: Negative for rash.  Neurological: Negative for dizziness, sensory change, loss of consciousness, weakness and headaches.  Endo/Heme/Allergies: Negative for environmental allergies. Does not bruise/bleed easily.  Psychiatric/Behavioral: Negative for depression and suicidal ideas. The patient is not nervous/anxious and does not have insomnia.        Objective:    Physical Exam  Constitutional: She is oriented to person, place, and time. She appears well-developed and well-nourished. No distress.  HENT:  Head: Normocephalic and atraumatic.  Eyes: Conjunctivae are normal.  Neck: Neck supple. No thyromegaly present.  Cardiovascular: Normal rate, regular rhythm and normal heart sounds.  No murmur heard. Pulmonary/Chest: Effort normal and breath sounds normal. No respiratory distress.  Abdominal: Soft. Bowel sounds are normal. She exhibits no distension and no mass. There is no tenderness.  Musculoskeletal: She exhibits no edema.  Lymphadenopathy:    She has no cervical adenopathy.  Neurological: She is alert and oriented to person, place, and time.  Skin: Skin is warm and dry.  Psychiatric: She has a normal mood and affect. Her behavior is normal.    BP 110/68 (BP Location: Left Arm, Patient Position: Sitting, Cuff Size: Normal)   Pulse (!) 55   Temp 98.1 F (36.7 C) (Oral)   Resp 18   Ht 5' 4.5" (1.638 m)   Wt 156 lb (70.8 kg)   LMP 04/07/2001 (Approximate)   SpO2 98%   BMI 26.36 kg/m  Wt Readings from Last 3 Encounters:  05/03/18 156 lb (70.8 kg)  03/15/18 164 lb 3.2 oz (74.5 kg)  10/19/17 167 lb 12.8 oz (76.1 kg)     Lab Results  Component Value Date   WBC 8.0 05/03/2018   HGB 13.4 05/03/2018   HCT 39.7 05/03/2018   PLT 268.0 05/03/2018    GLUCOSE 98 05/03/2018   CHOL 277 (H) 05/03/2018   TRIG 122.0 05/03/2018   HDL 69.80 05/03/2018   LDLDIRECT 146.5 08/11/2013   LDLCALC 183 (H) 05/03/2018   ALT 21 05/03/2018   AST 21 05/03/2018   NA 140 05/03/2018   K 4.7 05/03/2018   CL 103 05/03/2018   CREATININE 1.14 05/03/2018   BUN 15 05/03/2018   CO2 29 05/03/2018   TSH 1.15 05/03/2018   HGBA1C 6.9 (H) 05/03/2018   MICROALBUR <0.7 07/11/2015    Lab Results  Component Value Date   TSH 1.15 05/03/2018   Lab Results  Component Value Date   WBC 8.0 05/03/2018   HGB 13.4 05/03/2018   HCT 39.7 05/03/2018   MCV 88.5 05/03/2018   PLT 268.0 05/03/2018   Lab Results  Component Value Date   NA 140 05/03/2018   K 4.7 05/03/2018   CO2 29 05/03/2018   GLUCOSE 98 05/03/2018   BUN 15 05/03/2018   CREATININE 1.14 05/03/2018   BILITOT 1.0 05/03/2018   ALKPHOS 54 05/03/2018   AST 21 05/03/2018   ALT 21 05/03/2018   PROT 7.2 05/03/2018   ALBUMIN 4.6 05/03/2018   CALCIUM 9.9 05/03/2018   GFR 50.27 (L) 05/03/2018   Lab Results  Component Value Date   CHOL 277 (H)  05/03/2018   Lab Results  Component Value Date   HDL 69.80 05/03/2018   Lab Results  Component Value Date   LDLCALC 183 (H) 05/03/2018   Lab Results  Component Value Date   TRIG 122.0 05/03/2018   Lab Results  Component Value Date   CHOLHDL 4 05/03/2018   Lab Results  Component Value Date   HGBA1C 6.9 (H) 05/03/2018       Assessment & Plan:   Problem List Items Addressed This Visit    Hyperlipidemia    Encouraged heart healthy diet, increase exercise, avoid trans fats, consider a krill oil cap daily      Relevant Orders   Lipid panel (Completed)   TSH (Completed)   T4, free (Completed)   Osteopenia    Encouraged to get adequate exercise, calcium and vitamin d intake      Diabetes type 2, controlled (HCC)    hgba1c acceptable, minimize simple carbs. Increase exercise as tolerated.       Relevant Orders   Hemoglobin A1c (Completed)     Comprehensive metabolic panel (Completed)   TSH (Completed)   T4, free (Completed)   Sedimentation rate (Completed)   Anemia    Increase leafy greens, consider increased lean red meat and using cast iron cookware. Continue to monitor, report any concerns      Relevant Orders   Sedimentation rate (Completed)   Preventative health care    Patient encouraged to maintain heart healthy diet, regular exercise, adequate sleep. Consider daily probiotics. Take medications as prescribed      Urinary frequency    Urinalysis and culture today      Relevant Orders   CBC with Differential/Platelet (Completed)   Sedimentation rate (Completed)   Hand pain, left - Primary    Persistent for several months. 4th finger most notably. No injury or redness. Referred to hand surgeon for furthe consideration of possible duputyrens contracture.       Relevant Orders   Ambulatory referral to Hand Surgery      I am having Destiny Romero maintain her lamoTRIgine, naproxen, Calcium, omega-3 acid ethyl esters, cetirizine, TURMERIC PO, ONE TOUCH LANCETS, glucose blood, FERROCITE, Vitamin D, and Evening Primrose Oil.  No orders of the defined types were placed in this encounter.    Penni Homans, MD

## 2018-05-09 NOTE — Assessment & Plan Note (Signed)
Patient encouraged to maintain heart healthy diet, regular exercise, adequate sleep. Consider daily probiotics. Take medications as prescribed 

## 2018-05-09 NOTE — Assessment & Plan Note (Signed)
Encouraged to get adequate exercise, calcium and vitamin d intake 

## 2018-05-09 NOTE — Assessment & Plan Note (Signed)
Persistent for several months. 4th finger most notably. No injury or redness. Referred to hand surgeon for furthe consideration of possible duputyrens contracture.

## 2018-05-12 DIAGNOSIS — R69 Illness, unspecified: Secondary | ICD-10-CM | POA: Diagnosis not present

## 2018-05-23 DIAGNOSIS — H2511 Age-related nuclear cataract, right eye: Secondary | ICD-10-CM | POA: Diagnosis not present

## 2018-05-24 DIAGNOSIS — H2512 Age-related nuclear cataract, left eye: Secondary | ICD-10-CM | POA: Diagnosis not present

## 2018-05-27 DIAGNOSIS — R69 Illness, unspecified: Secondary | ICD-10-CM | POA: Diagnosis not present

## 2018-05-30 DIAGNOSIS — M720 Palmar fascial fibromatosis [Dupuytren]: Secondary | ICD-10-CM | POA: Insufficient documentation

## 2018-05-30 DIAGNOSIS — M65341 Trigger finger, right ring finger: Secondary | ICD-10-CM | POA: Insufficient documentation

## 2018-05-30 DIAGNOSIS — M65342 Trigger finger, left ring finger: Secondary | ICD-10-CM | POA: Insufficient documentation

## 2018-05-30 DIAGNOSIS — M65321 Trigger finger, right index finger: Secondary | ICD-10-CM | POA: Insufficient documentation

## 2018-06-06 DIAGNOSIS — H2512 Age-related nuclear cataract, left eye: Secondary | ICD-10-CM | POA: Diagnosis not present

## 2018-06-16 ENCOUNTER — Telehealth: Payer: Self-pay | Admitting: Family Medicine

## 2018-06-16 DIAGNOSIS — R69 Illness, unspecified: Secondary | ICD-10-CM | POA: Diagnosis not present

## 2018-06-16 NOTE — Telephone Encounter (Signed)
Copied from [place] [phone]. Topic: Quick Communication - See Telephone Encounter >> Jun 16, 2018  4:18 PM Rosalin Hawking wrote: CRM for notification. See Telephone encounter for: 06/16/18.  Pt dropped off document to be filled out by provider ([facility] Physical - 2 pages) Pt would like to be called tel [phone]. Document put at front office tray under providers name.

## 2018-06-20 NOTE — Telephone Encounter (Signed)
Patient has Health Exam Certificate for [employer], pt would like to know if there's anything she needs that she could get on the same day as her PPD placement [06/24/18], do not see a 2019 Influenza and/or a pneumonia vaccine listed; forwarded to provider/SLS 10/14

## 2018-06-20 NOTE — Telephone Encounter (Signed)
Patient can have flu and Prevnar-13 on 06/24/18 with the nurse

## 2018-06-24 ENCOUNTER — Ambulatory Visit (INDEPENDENT_AMBULATORY_CARE_PROVIDER_SITE_OTHER): Payer: Medicare HMO

## 2018-06-24 ENCOUNTER — Telehealth: Payer: Self-pay

## 2018-06-24 DIAGNOSIS — Z111 Encounter for screening for respiratory tuberculosis: Secondary | ICD-10-CM | POA: Diagnosis not present

## 2018-06-24 DIAGNOSIS — Z0184 Encounter for antibody response examination: Secondary | ICD-10-CM

## 2018-06-24 NOTE — Telephone Encounter (Signed)
Patient came into the office today to have a PPD test done. Patient has form that states she needs Tdap, MMR and Hep B.  Patient was advised about the MMR lab work. She is scheduled for Monday if lab work for MMR is approved.   Is there anything she needs to do for the Hep B.  Please advise

## 2018-06-24 NOTE — Progress Notes (Signed)
Tuberculin skin test applied to left  ventral forearm. Explained how to read the test, measuring induration not just erythema; she will come into office on Monday to have it read.    Patient has form that needs to be filled out

## 2018-06-26 NOTE — Telephone Encounter (Signed)
Yes order the labs to check immunity to MMR and an acute hepatitis panel but she can also have the Tdap and the Hep B immunizations. Should just know what she needs them for. Not sure if her insurance will cove the Tdap is all. If we know why she needs it it will help.

## 2018-06-27 ENCOUNTER — Other Ambulatory Visit (INDEPENDENT_AMBULATORY_CARE_PROVIDER_SITE_OTHER): Payer: Medicare HMO

## 2018-06-27 ENCOUNTER — Ambulatory Visit: Payer: Medicare HMO

## 2018-06-27 DIAGNOSIS — Z021 Encounter for pre-employment examination: Secondary | ICD-10-CM

## 2018-06-27 DIAGNOSIS — Z0184 Encounter for antibody response examination: Secondary | ICD-10-CM | POA: Diagnosis not present

## 2018-06-27 DIAGNOSIS — Z111 Encounter for screening for respiratory tuberculosis: Secondary | ICD-10-CM

## 2018-06-27 LAB — READ PPD: TB SKIN TEST: NEGATIVE

## 2018-06-27 NOTE — Telephone Encounter (Signed)
Orders placed.

## 2018-06-27 NOTE — Progress Notes (Signed)
Pt. here for PPD read, and bloodwork prior to receiving hepatitis B vaccine. Pt. Already received prevnar at her pharmacy, tdap in 2014. Pt. Denied any recent penetrating injuries that would require booster of Td. Site of PPD bruised, but negative for induration or erythema.

## 2018-06-28 LAB — MEASLES/MUMPS/RUBELLA IMMUNITY
Mumps IgG: 210 AU/mL
Rubella: 2.17 index
Rubeola IgG: >300 AU/mL

## 2018-06-28 LAB — HEPATITIS PANEL, ACUTE
HEP C AB: NONREACTIVE
Hep A IgM: NONREACTIVE
Hep B C IgM: NONREACTIVE
Hepatitis B Surface Ag: NONREACTIVE
SIGNAL TO CUT-OFF: 0.02 (ref <1.00)

## 2018-06-29 ENCOUNTER — Telehealth: Payer: Self-pay

## 2018-06-29 NOTE — Telephone Encounter (Signed)
Author phoned pt. to relay that paperwork has been completed and is ready for pick-up at the front desk.

## 2018-07-11 ENCOUNTER — Ambulatory Visit: Payer: Medicare HMO | Admitting: Obstetrics and Gynecology

## 2018-07-14 DIAGNOSIS — Z1231 Encounter for screening mammogram for malignant neoplasm of breast: Secondary | ICD-10-CM | POA: Diagnosis not present

## 2018-07-26 DIAGNOSIS — R69 Illness, unspecified: Secondary | ICD-10-CM | POA: Diagnosis not present

## 2018-07-27 ENCOUNTER — Other Ambulatory Visit: Payer: Self-pay | Admitting: Family Medicine

## 2018-08-09 ENCOUNTER — Ambulatory Visit (INDEPENDENT_AMBULATORY_CARE_PROVIDER_SITE_OTHER): Payer: Medicare HMO | Admitting: Internal Medicine

## 2018-08-09 ENCOUNTER — Encounter: Payer: Self-pay | Admitting: Internal Medicine

## 2018-08-09 VITALS — BP 128/60 | HR 61 | Ht 64.5 in | Wt 151.0 lb

## 2018-08-09 DIAGNOSIS — E11319 Type 2 diabetes mellitus with unspecified diabetic retinopathy without macular edema: Secondary | ICD-10-CM

## 2018-08-09 DIAGNOSIS — E785 Hyperlipidemia, unspecified: Secondary | ICD-10-CM

## 2018-08-09 LAB — POCT GLYCOSYLATED HEMOGLOBIN (HGB A1C): HEMOGLOBIN A1C: 6 % — AB (ref 4.0–5.6)

## 2018-08-09 NOTE — Progress Notes (Signed)
Patient ID: Destiny Romero, female   DOB: [birth year], 67 y.o.   MRN: [id number]   HPI: Destiny Romero is a 67 y.o.-year-old female, returning for follow-up for DM2, dx in 2014, diet controlled, with long term complications (DR).  Last visit 10 months ago.  She restarted to go to the gym and change her diet >> lost 17 lbs and sugars improved.  Last hemoglobin A1c was: Lab Results  Component Value Date   HGBA1C 6.9 (H) 05/03/2018   HGBA1C 6.9 10/19/2017   HGBA1C 6.6 03/26/2017   HGBA1C 6.7 10/30/2016   HGBA1C 7.0 (H) 06/08/2016   HGBA1C 6.7 (H) 07/11/2015   HGBA1C 6.6 (H) 03/19/2015   HGBA1C 6.5 06/14/2014   HGBA1C 6.6 (H) 08/11/2013   HGBA1C 6.4 04/25/2012   HGBA1C 6.4 06/25/2011   HGBA1C 6.2 01/30/2011   HGBA1C 6.1 08/25/2010   HGBA1C 6.1 01/08/2010   HGBA1C 6.1 07/12/2009   HGBA1C 6.4 01/11/2009   HGBA1C 6.2 (H) 06/27/2008   HGBA1C 6.3 (H) 05/23/2007   HGBA1C 6.5 (H) 01/28/2007   Patient's diabetes is diet-controlled. She had dizziness from metformin before.  Pt checks her sugars 1-2x a day - per review of her detailed log: - am: 97, 110, 120-140 >> 120-150 >> 94-135 - 2h after b'fast: n/c >> 116-157, 206 (oatmeal) - before lunch: 100 >> n/c >> 81-98 - 2h after lunch: n/c >> 122-153 - before dinner: n/c >> 87-115 - 2h after dinner: n/c >> 98-160 - bedtime: 110-120 >> n/c >> 91-140, 177 - nighttime: n/c No lows. Lowest sugar was 110 >> 81; unclear at which level she has hypogly awareness Highest sugar was 150 >> >200 (steroid inj for trigger finger)  Glucometer: Accu-Chek Aviva plus  After last visit she started to exercise at the gym-includes weight training.  - No CKD; last BUN/creatinine:  Lab Results  Component Value Date   BUN 15 05/03/2018   BUN 20 09/30/2017   CREATININE 1.14 05/03/2018   CREATININE 0.96 09/30/2017   - + HL; last set of lipids: Lab Results  Component Value Date   CHOL 277 (H) 05/03/2018   HDL 69.80 05/03/2018   LDLCALC 183 (H)  05/03/2018   LDLDIRECT 146.5 08/11/2013   TRIG 122.0 05/03/2018   CHOLHDL 4 05/03/2018  Not on a statin. - last eye exam was in 12/2017: + DR; + she had a retinal bleed- R eye. H/O laser Sx. + B cataract sx 06/2018. - + tingling in her feet, no numbness  Patient has a history of anemia, on Fe supplements since 2017.  Latest hemoglobin normal: Lab Results  Component Value Date   HGB 13.4 05/03/2018   ROS: Constitutional: + weight loss, no fatigue, no subjective hyperthermia, no subjective hypothermia Eyes: no blurry vision, no xerophthalmia ENT: no sore throat, no nodules palpated in neck, no dysphagia, no odynophagia, no hoarseness Cardiovascular: no CP/no SOB/no palpitations/no leg swelling Respiratory: no cough/no SOB/no wheezing Gastrointestinal: no N/no V/+ D/no C/no acid reflux Musculoskeletal: no muscle aches/no joint aches Skin: no rashes, no hair loss Neurological: no tremors/no numbness/+ tingling/no dizziness  I reviewed pt's medications, allergies, PMH, social hx, family hx, and changes were documented in the history of present illness. Otherwise, unchanged from my initial visit note.  Past Medical History:  Diagnosis Date  . Allergy   . Anal lesion 2007  . Anemia   . Anesthesia complication    Per pt/ sensitive to sedation!  . Central retinal artery occlusion 06/08/2016   limited  vision right eye.  Marland Kitchen Cervical dysplasia 04/1991   Dr Rica Koyanagi - CIN I, cone and freeze in 1990s with normal paps since  . Constipation 06/08/2016   moves bowels every 3 rd day   . Decreased visual acuity 06/08/2016   Retinal bleed right eye 2017  . Depression   . Fatigue 06/27/2017  . H/O measles   . H/O mumps   . History of chicken pox   . Mitral valve prolapse    NO MR; no SBE prophylaxis needed  . PONV (postoperative nausea and vomiting)   . Pre-diabetes    highest A1c 6.4 %  . Preventative health care 12/21/2016  . Squamous cell carcinoma in situ of skin    Dr [place]  .  Vision impairment    limited vision in right eye.   Past Surgical History:  Procedure Laterality Date  . CERVICAL CONE BIOPSY    . COLONOSCOPY  2003  . COLONOSCOPY  04/01/2011   [place] - repeat in 5 years  . COLONOSCOPY W/ POLYPECTOMY  2007   Dr Earlean Shawl  . LAMINECTOMY     1982  . OOPHORECTOMY Left 12/2011   Dr Ubaldo Glassing ; ovarian cyst  . RECTAL SURGERY  05/28/06   pre-canerous lesion removed in 2007; Dr Morton Stall, [facility]  . rt foot morton's surgery    . TUBAL LIGATION     Social History   Socioeconomic History  . Marital status: Single    Spouse name: Not on file  . Number of children: Not on file  . Years of education: Not on file  . Highest education level: Not on file  Occupational History  . Not on file  Social Needs  . Financial resource strain: Not on file  . Food insecurity:    Worry: Not on file    Inability: Not on file  . Transportation needs:    Medical: Not on file    Non-medical: Not on file  Tobacco Use  . Smoking status: Never Smoker  . Smokeless tobacco: Never Used  Substance and Sexual Activity  . Alcohol use: Yes    Comment: glass wine once a month  . Drug use: No  . Sexual activity: Never    Partners: Male    Birth control/protection: Abstinence  Lifestyle  . Physical activity:    Days per week: Not on file    Minutes per session: Not on file  . Stress: Not on file  Relationships  . Social connections:    Talks on phone: Not on file    Gets together: Not on file    Attends religious service: Not on file    Active member of club or organization: Not on file    Attends meetings of clubs or organizations: Not on file    Relationship status: Not on file  . Intimate partner violence:    Fear of current or ex partner: Not on file    Emotionally abused: Not on file    Physically abused: Not on file    Forced sexual activity: Not on file  Other Topics Concern  . Not on file  Social History Narrative   Lives alone   No dietary restrictions    Current Outpatient Medications on File Prior to Visit  Medication Sig Dispense Refill  . Calcium 200 MG TABS Take by mouth. Tale [phone] mg daily    . cetirizine (ZYRTEC) 10 MG tablet Take 10 mg by mouth daily.    . Cholecalciferol (VITAMIN D) 2000  units CAPS     . Evening Primrose Oil 1000 MG CAPS     . FERROCITE 324 MG TABS tablet TAKE 1 TABLET (106 MG OF IRON TOTAL) BY MOUTH DAILY. 90 tablet 0  . glucose blood (ONETOUCH VERIO) test strip Use to test blood sugar 3x daily 300 each 3  . lamoTRIgine (LAMICTAL) 200 MG tablet Take 200 mg by mouth daily.    . naproxen (NAPROSYN) 500 MG tablet Take 500 mg by mouth as needed.    Marland Kitchen omega-3 acid ethyl esters (LOVAZA) 1 g capsule Take by mouth daily.    . ONE TOUCH LANCETS MISC Use to test blood sugar once daily 100 each 1  . TURMERIC PO Take by mouth.     No current facility-administered medications on file prior to visit.    Allergies  Allergen Reactions  . Cortisone Palpitations and Other (See Comments)    hyperactivity  . Pseudoephedrine     tachycardia  . Metformin And Related     04/15/15 caused dizziness  . Shellfish Allergy Other (See Comments)    [other]   Family History  Problem Relation Age of Onset  . Diabetes Mother   . Dementia Mother   . Prostate cancer Father   . COPD Father   . Heart disease Father        CAD - used nitroglycerin tablets  . Diabetes Sister        obese  . Thyroid cancer Sister   . Diabetes Brother        not obese  . Lymphoma Brother        NHL  . Cancer Brother        non Hodgkin's Lymphoma  . Diabetes Maternal Grandmother        IDDM  . Diabetes Paternal Grandmother        IDDM  . Heart disease Paternal Grandmother   . Diabetes Maternal Aunt        X5 ; all IDDM  . Heart disease Maternal Aunt        "enlarged heart"  . Heart disease Maternal Uncle        "enlarged heart"  . Heart disease Paternal Grandfather        CAD - used nitroglycerin tablets  . Diabetes Brother   . Colon  cancer Neg Hx   . Esophageal cancer Neg Hx   . Stomach cancer Neg Hx   . Stroke Neg Hx     PE: BP 128/60   Pulse 61   Ht 5' 4.5" (1.638 m) Comment: measured  Wt 151 lb (68.5 kg)   LMP 04/07/2001 (Approximate)   SpO2 99%   BMI 25.52 kg/m  Wt Readings from Last 3 Encounters:  08/09/18 151 lb (68.5 kg)  05/03/18 156 lb (70.8 kg)  03/15/18 164 lb 3.2 oz (74.5 kg)   Constitutional: overweight, in NAD Eyes: PERRLA, EOMI, no exophthalmos ENT: moist mucous membranes, no thyromegaly, no cervical lymphadenopathy Cardiovascular: RRR, No MRG Respiratory: CTA B Gastrointestinal: abdomen soft, NT, ND, BS+ Musculoskeletal: no deformities, strength intact in all 4 Skin: moist, warm, no rashes Neurological: no tremor with outstretched hands, DTR normal in all 4  ASSESSMENT: 1. DM2, diet controlled, controlled, with complications - DR  2. HL  3. Overweight  PLAN:  1. Patient with history of diet-controlled diabetes.  At last visit, her HbA1c was slightly higher, at 6.9%, but still at goal.  She was not exercising much at that time and relaxed her  diet over the holidays.  She usually does have seasonal dysthymia during the winter.  Right before last visit she restarted to go to the gym. I gave her a new meter, Accu-Chek guide at next visit she was not checking sugars consistently.  I strongly advised her to check every day. -Since last visit, she started to do a very good job checking her sugars daily, and she continues to exercise and to pay attention to her diet.  She is following a mostly plant-based diet.  Sugars improved significantly, with the exception of the time when she had to have a steroid injection.  Sugars increased to 250's afterwards. -However, per review of her log, they are at or very close to goal throughout the day so no medication is needed for now for her diabetes. -I strongly advised her to continue with her diet and exercise - I suggested to:  Patient Instructions   Please check sugars 1x a day.  Please let me know if the sugars are consistently <80 or >200.  Please return in 6 months with your sugar log.   - today, HbA1c is 6.0% (much better) - continue checking sugars at different times of the day - check 1x a day, rotating checks - advised for yearly eye exams >> she is UTD - Return to clinic in 6 mo with sugar log   2. HL - Reviewed latest lipid panel from 04/2018: LDL was very high! Lab Results  Component Value Date   CHOL 277 (H) 05/03/2018   HDL 69.80 05/03/2018   LDLCALC 183 (H) 05/03/2018   LDLDIRECT 146.5 08/11/2013   TRIG 122.0 05/03/2018   CHOLHDL 4 05/03/2018  - On Lov[place], not on a statin. -I think improvement in her weight and her diabetes control will definitely help her LDL but we discussed that a statin would help beyond lowering her cholesterol -She will have another lipid panel with PCP at next visit.  At that time, she may need a statin  3. Overweight - lost 17 lbs since last OV - congratulated her - Discussed about what the healthy BMI means.  Her target weight is 140 pounds.  As of now, in the morning, she weighs 146 pounds. - continue her mostly plant-based diet   Philemon Kingdom, MD PhD [facility]

## 2018-08-09 NOTE — Patient Instructions (Signed)
Please check sugars 1x a day.  Please let me know if the sugars are consistently <80 or >200.  Please return in 6 months with your sugar log.

## 2018-08-15 DIAGNOSIS — R69 Illness, unspecified: Secondary | ICD-10-CM | POA: Diagnosis not present

## 2018-08-16 DIAGNOSIS — R69 Illness, unspecified: Secondary | ICD-10-CM | POA: Diagnosis not present

## 2018-09-05 ENCOUNTER — Telehealth: Payer: Self-pay

## 2018-09-05 DIAGNOSIS — E119 Type 2 diabetes mellitus without complications: Secondary | ICD-10-CM

## 2018-09-05 NOTE — Telephone Encounter (Signed)
Received notice from [facility] regarding care gaps, recommending urine microalbumin. Routed to Dr. Charlett Blake to advise.

## 2018-09-08 NOTE — Telephone Encounter (Signed)
It is reasonable to get a urine microalb for DM. If patient is willing set up lab appt to get that done.

## 2018-09-08 NOTE — Telephone Encounter (Signed)
Notified pt and scheduled lab appt for 09/14/18 at 7am. Future lab order has been entered.

## 2018-09-14 ENCOUNTER — Other Ambulatory Visit: Payer: Medicare HMO

## 2018-09-30 DIAGNOSIS — D2271 Melanocytic nevi of right lower limb, including hip: Secondary | ICD-10-CM | POA: Diagnosis not present

## 2018-09-30 DIAGNOSIS — D485 Neoplasm of uncertain behavior of skin: Secondary | ICD-10-CM | POA: Diagnosis not present

## 2018-09-30 DIAGNOSIS — L570 Actinic keratosis: Secondary | ICD-10-CM | POA: Diagnosis not present

## 2018-09-30 DIAGNOSIS — D2272 Melanocytic nevi of left lower limb, including hip: Secondary | ICD-10-CM | POA: Diagnosis not present

## 2018-09-30 DIAGNOSIS — D1801 Hemangioma of skin and subcutaneous tissue: Secondary | ICD-10-CM | POA: Diagnosis not present

## 2018-09-30 DIAGNOSIS — D2262 Melanocytic nevi of left upper limb, including shoulder: Secondary | ICD-10-CM | POA: Diagnosis not present

## 2018-09-30 DIAGNOSIS — D2261 Melanocytic nevi of right upper limb, including shoulder: Secondary | ICD-10-CM | POA: Diagnosis not present

## 2018-09-30 DIAGNOSIS — L820 Inflamed seborrheic keratosis: Secondary | ICD-10-CM | POA: Diagnosis not present

## 2018-09-30 DIAGNOSIS — Z85828 Personal history of other malignant neoplasm of skin: Secondary | ICD-10-CM | POA: Diagnosis not present

## 2018-09-30 DIAGNOSIS — L821 Other seborrheic keratosis: Secondary | ICD-10-CM | POA: Diagnosis not present

## 2018-09-30 DIAGNOSIS — L814 Other melanin hyperpigmentation: Secondary | ICD-10-CM | POA: Diagnosis not present

## 2018-10-17 ENCOUNTER — Encounter: Payer: Self-pay | Admitting: Family Medicine

## 2018-10-21 DIAGNOSIS — R69 Illness, unspecified: Secondary | ICD-10-CM | POA: Diagnosis not present

## 2018-10-24 ENCOUNTER — Other Ambulatory Visit: Payer: Self-pay | Admitting: Family Medicine

## 2018-11-07 ENCOUNTER — Encounter: Payer: Self-pay | Admitting: Family Medicine

## 2018-11-07 ENCOUNTER — Ambulatory Visit (INDEPENDENT_AMBULATORY_CARE_PROVIDER_SITE_OTHER): Payer: Medicare HMO | Admitting: Family Medicine

## 2018-11-07 DIAGNOSIS — R0600 Dyspnea, unspecified: Secondary | ICD-10-CM | POA: Insufficient documentation

## 2018-11-07 DIAGNOSIS — R0602 Shortness of breath: Secondary | ICD-10-CM | POA: Diagnosis not present

## 2018-11-07 DIAGNOSIS — E663 Overweight: Secondary | ICD-10-CM

## 2018-11-07 DIAGNOSIS — F418 Other specified anxiety disorders: Secondary | ICD-10-CM | POA: Diagnosis not present

## 2018-11-07 DIAGNOSIS — M8580 Other specified disorders of bone density and structure, unspecified site: Secondary | ICD-10-CM

## 2018-11-07 DIAGNOSIS — R0789 Other chest pain: Secondary | ICD-10-CM | POA: Diagnosis not present

## 2018-11-07 DIAGNOSIS — R69 Illness, unspecified: Secondary | ICD-10-CM | POA: Diagnosis not present

## 2018-11-07 DIAGNOSIS — E11319 Type 2 diabetes mellitus with unspecified diabetic retinopathy without macular edema: Secondary | ICD-10-CM

## 2018-11-07 DIAGNOSIS — E785 Hyperlipidemia, unspecified: Secondary | ICD-10-CM

## 2018-11-07 DIAGNOSIS — Z6829 Body mass index (BMI) 29.0-29.9, adult: Secondary | ICD-10-CM | POA: Insufficient documentation

## 2018-11-07 LAB — COMPREHENSIVE METABOLIC PANEL
ALK PHOS: 51 U/L (ref 39–117)
ALT: 20 U/L (ref 0–35)
AST: 18 U/L (ref 0–37)
Albumin: 4.6 g/dL (ref 3.5–5.2)
BILIRUBIN TOTAL: 0.8 mg/dL (ref 0.2–1.2)
BUN: 21 mg/dL (ref 6–23)
CO2: 30 mEq/L (ref 19–32)
Calcium: 10.2 mg/dL (ref 8.4–10.5)
Chloride: 104 mEq/L (ref 96–112)
Creatinine, Ser: 1.1 mg/dL (ref 0.40–1.20)
GFR: 49.21 mL/min — ABNORMAL LOW (ref >60.00)
GLUCOSE: 139 mg/dL — AB (ref 70–99)
Potassium: 4.8 mEq/L (ref 3.5–5.1)
SODIUM: 143 meq/L (ref 135–145)
TOTAL PROTEIN: 6.9 g/dL (ref 6.0–8.3)

## 2018-11-07 LAB — CBC
HCT: 41.7 % (ref 36.0–46.0)
Hemoglobin: 14.1 g/dL (ref 12.0–15.0)
MCHC: 33.8 g/dL (ref 30.0–36.0)
MCV: 90.2 fl (ref 78.0–100.0)
Platelets: 255 10*3/uL (ref 150.0–400.0)
RBC: 4.63 Mil/uL (ref 3.87–5.11)
RDW: 14 % (ref 11.5–15.5)
WBC: 7.8 10*3/uL (ref 4.0–10.5)

## 2018-11-07 LAB — LIPID PANEL
Cholesterol: 306 mg/dL — ABNORMAL HIGH (ref 0–200)
HDL: 89.9 mg/dL (ref >39.00)
LDL Cholesterol: 200 mg/dL — ABNORMAL HIGH (ref 0–99)
NONHDL: 216.31
Total CHOL/HDL Ratio: 3
Triglycerides: 83 mg/dL (ref 0.0–149.0)
VLDL: 16.6 mg/dL (ref 0.0–40.0)

## 2018-11-07 LAB — TSH: TSH: 2.55 u[IU]/mL (ref 0.35–4.50)

## 2018-11-07 NOTE — Assessment & Plan Note (Signed)
Is occurring when she really exerts herself especially in cold air. Will proceed with EKG, Echo and referral to cardiology. Start NTG 0.4 mg SL tabs as directed. Avoid mad dashes til seen

## 2018-11-07 NOTE — Assessment & Plan Note (Signed)
Has done well with the weight watchers platform

## 2018-11-07 NOTE — Assessment & Plan Note (Signed)
Encouraged heart healthy diet, increase exercise, avoid trans fats, consider a krill oil cap daily 

## 2018-11-07 NOTE — Assessment & Plan Note (Signed)
Encouraged to get adequate exercise, calcium and vitamin d intake 

## 2018-11-07 NOTE — Assessment & Plan Note (Signed)
liekly mutifactorial and complicated by fatigue and stress but will proceed with echo. She is OK on treadmill at the gym but when she is running errands it starts.

## 2018-11-07 NOTE — Assessment & Plan Note (Addendum)
Worsening recently with family stress of a niece being very ill. She is following closely with psychiatry and counselor. She thinks the seasonal darkness affects her some but she chooses not to take

## 2018-11-07 NOTE — Assessment & Plan Note (Addendum)
hgba1c acceptable and down to 6.0 with endocrinology, minimize simple carbs. Increase exercise as tolerated. Continue current meds

## 2018-11-07 NOTE — Progress Notes (Signed)
Subjective:    Patient ID: Destiny Romero, female    DOB: July 11, 1951, 68 y.o.   MRN: [id number]  No chief complaint on file.   HPI Patient is in today for follow-up.  She notes she has been under and ignored in amount of stress lately and been feeling more anxious than usual.  She has had a very sick family member amongst other concerns.  She is endorsing a couple of complaints one is have worsening shortness of breath with exertion.  She notes doing all right on the treadmill at a constant pace but if she is rushing in and out of stores she will get winded fairly easily.  She does not necessarily have palpitations diaphoresis or nausea but she does endorse some episodes occurring with some atypical chest pressure which again resolves with rest.  This is having more frequently over the past week and she does have an episode in the office although her EKG is unremarkable when checked.  No recent febrile illness or hospitalizations.Denies palp/HA/congestion/fevers/GI or GU c/o. Taking meds as prescribed  Past Medical History:  Diagnosis Date  . Allergy   . Anal lesion 2007  . Anemia   . Anesthesia complication    Per pt/ sensitive to sedation!  . Central retinal artery occlusion 06/08/2016   limited vision right eye.  Marland Kitchen Cervical dysplasia 04/1991   Dr Rica Koyanagi - CIN I, cone and freeze in 1990s with normal paps since  . Constipation 06/08/2016   moves bowels every 3 rd day   . Decreased visual acuity 06/08/2016   Retinal bleed right eye 2017  . Depression   . Fatigue 06/27/2017  . H/O measles   . H/O mumps   . History of chicken pox   . Mitral valve prolapse    NO MR; no SBE prophylaxis needed  . PONV (postoperative nausea and vomiting)   . Pre-diabetes    highest A1c 6.4 %  . Preventative health care 12/21/2016  . Squamous cell carcinoma in situ of skin    Dr Syble Creek  . Vision impairment    limited vision in right eye.    Past Surgical History:  Procedure Laterality Date  .  CERVICAL CONE BIOPSY    . COLONOSCOPY  2003  . COLONOSCOPY  04/01/2011   [place] - repeat in 5 years  . COLONOSCOPY W/ POLYPECTOMY  2007   Dr Earlean Shawl  . LAMINECTOMY     1982  . OOPHORECTOMY Left 12/2011   Dr Ubaldo Glassing ; ovarian cyst  . RECTAL SURGERY  05/28/06   pre-canerous lesion removed in 2007; Dr Morton Stall, [facility]  . rt foot morton's surgery    . TUBAL LIGATION      Family History  Problem Relation Age of Onset  . Diabetes Mother   . Dementia Mother   . Prostate cancer Father   . COPD Father   . Heart disease Father        CAD - used nitroglycerin tablets  . Diabetes Sister        obese  . Thyroid cancer Sister   . Diabetes Brother        not obese  . Lymphoma Brother        NHL  . Cancer Brother        non Hodgkin's Lymphoma  . Diabetes Maternal Grandmother        IDDM  . Diabetes Paternal Grandmother        IDDM  . Heart disease Paternal Grandmother   .  Diabetes Maternal Aunt        X5 ; all IDDM  . Heart disease Maternal Aunt        "enlarged heart"  . Heart disease Maternal Uncle        "enlarged heart"  . Heart disease Paternal Grandfather        CAD - used nitroglycerin tablets  . Diabetes Brother   . Colon cancer Neg Hx   . Esophageal cancer Neg Hx   . Stomach cancer Neg Hx   . Stroke Neg Hx     Social History   Socioeconomic History  . Marital status: Single    Spouse name: Not on file  . Number of children: Not on file  . Years of education: Not on file  . Highest education level: Not on file  Occupational History  . Not on file  Social Needs  . Financial resource strain: Not on file  . Food insecurity:    Worry: Not on file    Inability: Not on file  . Transportation needs:    Medical: Not on file    Non-medical: Not on file  Tobacco Use  . Smoking status: Never Smoker  . Smokeless tobacco: Never Used  Substance and Sexual Activity  . Alcohol use: Yes    Comment: glass wine once a month  . Drug use: No  . Sexual activity: Never     Partners: Male    Birth control/protection: Abstinence  Lifestyle  . Physical activity:    Days per week: Not on file    Minutes per session: Not on file  . Stress: Not on file  Relationships  . Social connections:    Talks on phone: Not on file    Gets together: Not on file    Attends religious service: Not on file    Active member of club or organization: Not on file    Attends meetings of clubs or organizations: Not on file    Relationship status: Not on file  . Intimate partner violence:    Fear of current or ex partner: Not on file    Emotionally abused: Not on file    Physically abused: Not on file    Forced sexual activity: Not on file  Other Topics Concern  . Not on file  Social History Narrative   Lives alone   No dietary restrictions    Outpatient Medications Prior to Visit  Medication Sig Dispense Refill  . Calcium 200 MG TABS Take by mouth. Tale [phone] mg daily    . cetirizine (ZYRTEC) 10 MG tablet Take 10 mg by mouth daily.    . Cholecalciferol (VITAMIN D) 2000 units CAPS     . Evening Primrose Oil 1000 MG CAPS     . FERROCITE 324 MG TABS tablet TAKE 1 TABLET (106 MG OF IRON TOTAL) BY MOUTH DAILY. 90 tablet 0  . glucose blood (ONETOUCH VERIO) test strip Use to test blood sugar 3x daily 300 each 3  . lamoTRIgine (LAMICTAL) 200 MG tablet Take 200 mg by mouth daily.    . naproxen (NAPROSYN) 500 MG tablet Take 500 mg by mouth as needed.    Marland Kitchen omega-3 acid ethyl esters (LOVAZA) 1 g capsule Take by mouth daily.    . ONE TOUCH LANCETS MISC Use to test blood sugar once daily 100 each 1  . TURMERIC PO Take by mouth.     No facility-administered medications prior to visit.     Allergies  Allergen  Reactions  . Cortisone Palpitations and Other (See Comments)    hyperactivity  . Pseudoephedrine     tachycardia  . Metformin And Related     04/15/15 caused dizziness  . Shellfish Allergy Other (See Comments)    [other]    Review of Systems  Constitutional:  Positive for malaise/fatigue. Negative for fever.  HENT: Negative for congestion and ear pain.   Eyes: Negative for blurred vision.  Respiratory: Positive for shortness of breath.   Cardiovascular: Positive for chest pain. Negative for palpitations and leg swelling.  Gastrointestinal: Negative for abdominal pain, blood in stool and nausea.  Genitourinary: Negative for dysuria and frequency.  Musculoskeletal: Negative for falls.  Skin: Negative for rash.  Neurological: Negative for dizziness, loss of consciousness and headaches.  Endo/Heme/Allergies: Negative for environmental allergies.  Psychiatric/Behavioral: Positive for depression. The patient is nervous/anxious.        Objective:    Physical Exam Vitals signs and nursing note reviewed.  Constitutional:      General: She is not in acute distress.    Appearance: She is well-developed.  HENT:     Head: Normocephalic and atraumatic.     Nose: Nose normal.  Eyes:     General:        Right eye: No discharge.        Left eye: No discharge.  Neck:     Musculoskeletal: Normal range of motion and neck supple.  Cardiovascular:     Rate and Rhythm: Normal rate and regular rhythm.     Heart sounds: No murmur.  Pulmonary:     Effort: Pulmonary effort is normal.     Breath sounds: Normal breath sounds.  Abdominal:     General: Bowel sounds are normal.     Palpations: Abdomen is soft.     Tenderness: There is no abdominal tenderness.  Skin:    General: Skin is warm and dry.  Neurological:     Mental Status: She is alert and oriented to person, place, and time.     BP 108/70 (BP Location: Left Arm, Patient Position: Sitting, Cuff Size: Normal)   Pulse 83   Temp 97.6 F (36.4 C) (Oral)   Resp 18   Wt 150 lb 9.6 oz (68.3 kg)   LMP 04/07/2001 (Approximate)   SpO2 98%   BMI 25.45 kg/m  Wt Readings from Last 3 Encounters:  11/07/18 150 lb 9.6 oz (68.3 kg)  08/09/18 151 lb (68.5 kg)  05/03/18 156 lb (70.8 kg)     Lab  Results  Component Value Date   WBC 8.0 05/03/2018   HGB 13.4 05/03/2018   HCT 39.7 05/03/2018   PLT 268.0 05/03/2018   GLUCOSE 98 05/03/2018   CHOL 277 (H) 05/03/2018   TRIG 122.0 05/03/2018   HDL 69.80 05/03/2018   LDLDIRECT 146.5 08/11/2013   LDLCALC 183 (H) 05/03/2018   ALT 21 05/03/2018   AST 21 05/03/2018   NA 140 05/03/2018   K 4.7 05/03/2018   CL 103 05/03/2018   CREATININE 1.14 05/03/2018   BUN 15 05/03/2018   CO2 29 05/03/2018   TSH 1.15 05/03/2018   HGBA1C 6.0 (A) 08/09/2018   MICROALBUR <0.7 07/11/2015    Lab Results  Component Value Date   TSH 1.15 05/03/2018   Lab Results  Component Value Date   WBC 8.0 05/03/2018   HGB 13.4 05/03/2018   HCT 39.7 05/03/2018   MCV 88.5 05/03/2018   PLT 268.0 05/03/2018   Lab Results  Component Value Date   NA 140 05/03/2018   K 4.7 05/03/2018   CO2 29 05/03/2018   GLUCOSE 98 05/03/2018   BUN 15 05/03/2018   CREATININE 1.14 05/03/2018   BILITOT 1.0 05/03/2018   ALKPHOS 54 05/03/2018   AST 21 05/03/2018   ALT 21 05/03/2018   PROT 7.2 05/03/2018   ALBUMIN 4.6 05/03/2018   CALCIUM 9.9 05/03/2018   GFR 50.27 (L) 05/03/2018   Lab Results  Component Value Date   CHOL 277 (H) 05/03/2018   Lab Results  Component Value Date   HDL 69.80 05/03/2018   Lab Results  Component Value Date   LDLCALC 183 (H) 05/03/2018   Lab Results  Component Value Date   TRIG 122.0 05/03/2018   Lab Results  Component Value Date   CHOLHDL 4 05/03/2018   Lab Results  Component Value Date   HGBA1C 6.0 (A) 08/09/2018       Assessment & Plan:   Problem List Items Addressed This Visit    Controlled type 2 diabetes with retinopathy ([place]) (Chronic)    hgba1c acceptable and down to 6.0 with endocrinology, minimize simple carbs. Increase exercise as tolerated. Continue current meds      Hyperlipidemia    Encouraged heart healthy diet, increase exercise, avoid trans fats, consider a krill oil cap daily      Relevant Orders     Lipid panel   Depression with anxiety    Worsening recently with family stress of a niece being very ill. She is following closely with psychiatry and counselor. She thinks the seasonal darkness affects her some but she chooses not to take      Osteopenia    Encouraged to get adequate exercise, calcium and vitamin d intake      SOB (shortness of breath)    liekly mutifactorial and complicated by fatigue and stress but will proceed with echo. She is OK on treadmill at the gym but when she is running errands it starts.       Atypical chest pain    Is occurring when she really exerts herself especially in cold air. Will proceed with EKG, Echo and referral to cardiology. Start NTG 0.4 mg SL tabs as directed. Avoid mad dashes til seen      Relevant Orders   EKG 12-Lead   CBC   Comprehensive metabolic panel   TSH   ECHOCARDIOGRAM COMPLETE   EKG 12-Lead   Overweight    Has done well with the weight watchers platform         I am having Vivi Barrack maintain her lamoTRIgine, naproxen, Calcium, omega-3 acid ethyl esters, cetirizine, TURMERIC PO, ONE TOUCH LANCETS, glucose blood, Vitamin D, Evening Primrose Oil, and FERROCITE.  No orders of the defined types were placed in this encounter.    Penni Homans, MD

## 2018-11-08 MED ORDER — ATORVASTATIN CALCIUM 10 MG PO TABS: 10.0000 mg | ORAL_TABLET | Freq: Every day | ORAL | 3 refills | Status: DC

## 2018-11-08 NOTE — Addendum Note (Signed)
Addended by: Magdalene Molly A on: 11/08/2018 12:20 PM   Modules accepted: Orders

## 2018-11-17 ENCOUNTER — Other Ambulatory Visit: Payer: Self-pay

## 2018-11-17 ENCOUNTER — Ambulatory Visit: Payer: Medicare HMO | Admitting: Family Medicine

## 2018-11-25 ENCOUNTER — Ambulatory Visit: Payer: Medicare HMO | Admitting: Family Medicine

## 2018-12-08 DIAGNOSIS — R69 Illness, unspecified: Secondary | ICD-10-CM | POA: Diagnosis not present

## 2019-01-11 DIAGNOSIS — E119 Type 2 diabetes mellitus without complications: Secondary | ICD-10-CM | POA: Diagnosis not present

## 2019-01-16 ENCOUNTER — Encounter (INDEPENDENT_AMBULATORY_CARE_PROVIDER_SITE_OTHER): Payer: Medicare HMO | Admitting: Ophthalmology

## 2019-01-16 ENCOUNTER — Other Ambulatory Visit: Payer: Self-pay

## 2019-01-16 DIAGNOSIS — H35371 Puckering of macula, right eye: Secondary | ICD-10-CM

## 2019-01-16 DIAGNOSIS — H43813 Vitreous degeneration, bilateral: Secondary | ICD-10-CM | POA: Diagnosis not present

## 2019-01-16 DIAGNOSIS — H318 Other specified disorders of choroid: Secondary | ICD-10-CM | POA: Diagnosis not present

## 2019-01-17 ENCOUNTER — Other Ambulatory Visit: Payer: Self-pay | Admitting: Family Medicine

## 2019-01-18 ENCOUNTER — Other Ambulatory Visit: Payer: Self-pay | Admitting: Internal Medicine

## 2019-01-18 DIAGNOSIS — R69 Illness, unspecified: Secondary | ICD-10-CM | POA: Diagnosis not present

## 2019-02-09 ENCOUNTER — Ambulatory Visit: Payer: Medicare HMO | Admitting: Internal Medicine

## 2019-02-15 DIAGNOSIS — R69 Illness, unspecified: Secondary | ICD-10-CM | POA: Diagnosis not present

## 2019-02-20 DIAGNOSIS — M65342 Trigger finger, left ring finger: Secondary | ICD-10-CM | POA: Diagnosis not present

## 2019-02-20 DIAGNOSIS — R69 Illness, unspecified: Secondary | ICD-10-CM | POA: Diagnosis not present

## 2019-02-20 DIAGNOSIS — M720 Palmar fascial fibromatosis [Dupuytren]: Secondary | ICD-10-CM | POA: Diagnosis not present

## 2019-03-16 NOTE — Progress Notes (Deleted)
Virtual Visit via Video Note  I connected with patient on 03/17/19 at  8:00 AM EDT by a video enabled telemedicine application and verified that I am speaking with the correct person using two identifiers.   THIS ENCOUNTER IS A VIRTUAL VISIT DUE TO COVID-19 - PATIENT WAS NOT SEEN IN THE OFFICE. PATIENT HAS CONSENTED TO VIRTUAL VISIT / TELEMEDICINE VISIT   Location of patient: home  Location of provider: office  I discussed the limitations of evaluation and management by telemedicine and the availability of in person appointments. The patient expressed understanding and agreed to proceed.   Subjective:   Destiny Romero is a 68 y.o. female who presents for Medicare Annual (Subsequent) preventive examination.  Review of Systems: No ROS.  Medicare Wellness Virtual Visit.  Visual/audio telehealth visit, UTA vital signs.   See social history for additional risk factors.   Sleep patterns: Home Safety/Smoke Alarms: Feels safe in home. Smoke alarms in place.  Lives alone in 1 story home.   Female:   Mammo-       Dexa scan- 04/26/17        CCS-09/15/16. 5 yr recall     Objective:     Vitals: LMP 04/07/2001 (Approximate)   There is no height or weight on file to calculate BMI.  Advanced Directives 03/15/2018 12/21/2016 09/15/2016 09/06/2014  Does Patient Have a Medical Advance Directive? Yes Yes Yes Yes  Type of Paramedic of [place];Living will [place];Living will - Living will;Out of facility DNR (pink MOST or yellow form)  Copy of [place] in Chart? No - copy requested No - copy requested - -    Tobacco Social History   Tobacco Use  Smoking Status Never Smoker  Smokeless Tobacco Never Used     Counseling given: Not Answered   Clinical Intake:                       Past Medical History:  Diagnosis Date   Allergy    Anal lesion 2007   Anemia    Anesthesia complication    Per pt/ sensitive to  sedation!   Central retinal artery occlusion 06/08/2016   limited vision right eye.   Cervical dysplasia 04/1991   Dr Rica Koyanagi - CIN I, cone and freeze in 1990s with normal paps since   Constipation 06/08/2016   moves bowels every 3 rd day    Decreased visual acuity 06/08/2016   Retinal bleed right eye 2017   Depression    Fatigue 06/27/2017   H/O measles    H/O mumps    History of chicken pox    Mitral valve prolapse    NO MR; no SBE prophylaxis needed   PONV (postoperative nausea and vomiting)    Pre-diabetes    highest A1c 6.4 %   Preventative health care 12/21/2016   Squamous cell carcinoma in situ of skin    Dr Syble Creek   Vision impairment    limited vision in right eye.   Past Surgical History:  Procedure Laterality Date   CERVICAL CONE BIOPSY     COLONOSCOPY  2003   COLONOSCOPY  04/01/2011   [place] - repeat in 5 years   COLONOSCOPY W/ POLYPECTOMY  2007   Dr Earlean Shawl   EYE SURGERY Bilateral    cataracts   LAMINECTOMY     1982   OOPHORECTOMY Left 12/2011   Dr Ubaldo Glassing ; ovarian cyst   RECTAL SURGERY  05/28/06   pre-canerous lesion removed in 2007; Dr Morton Stall, [facility]   rt foot morton's surgery     TUBAL LIGATION     Family History  Problem Relation Age of Onset   Diabetes Mother    Dementia Mother    Prostate cancer Father    COPD Father    Heart disease Father        CAD - used nitroglycerin tablets   Diabetes Sister        obese   Thyroid cancer Sister    Diabetes Brother        not obese   Lymphoma Brother        NHL   Cancer Brother        non Hodgkin's Lymphoma   Diabetes Maternal Grandmother        IDDM   Diabetes Paternal Grandmother        IDDM   Heart disease Paternal Grandmother    Diabetes Maternal Aunt        X5 ; all IDDM   Heart disease Maternal Aunt        "enlarged heart"   Heart disease Maternal Uncle        "enlarged heart"   Heart disease Paternal Grandfather        CAD - used nitroglycerin  tablets   Diabetes Brother    Colon cancer Neg Hx    Esophageal cancer Neg Hx    Stomach cancer Neg Hx    Stroke Neg Hx    Social History   Socioeconomic History   Marital status: Single    Spouse name: Not on file   Number of children: Not on file   Years of education: Not on file   Highest education level: Not on file  Occupational History   Not on file  Social Needs   Financial resource strain: Not on file   Food insecurity    Worry: Not on file    Inability: Not on file   Transportation needs    Medical: Not on file    Non-medical: Not on file  Tobacco Use   Smoking status: Never Smoker   Smokeless tobacco: Never Used  Substance and Sexual Activity   Alcohol use: Yes    Comment: glass wine once a month   Drug use: No   Sexual activity: Never    Partners: Male    Birth control/protection: Abstinence  Lifestyle   Physical activity    Days per week: Not on file    Minutes per session: Not on file   Stress: Not on file  Relationships   Social connections    Talks on phone: Not on file    Gets together: Not on file    Attends religious service: Not on file    Active member of club or organization: Not on file    Attends meetings of clubs or organizations: Not on file    Relationship status: Not on file  Other Topics Concern   Not on file  Social History Narrative   Lives alone   No dietary restrictions    Outpatient Encounter Medications as of 03/17/2019  Medication Sig   atorvastatin (LIPITOR) 10 MG tablet Take 1 tablet (10 mg total) by mouth daily.   Calcium 200 MG TABS Take by mouth. Tale [phone] mg daily   cetirizine (ZYRTEC) 10 MG tablet Take 10 mg by mouth daily.   Cholecalciferol (VITAMIN D) 2000 units CAPS    Evening Primrose Oil 1000  MG CAPS    FERROCITE 324 MG TABS tablet TAKE 1 TABLET (106 MG OF IRON TOTAL) BY MOUTH DAILY.   lamoTRIgine (LAMICTAL) 200 MG tablet Take 200 mg by mouth daily.   naproxen (NAPROSYN) 500  MG tablet Take 500 mg by mouth as needed.   omega-3 acid ethyl esters (LOVAZA) 1 g capsule Take by mouth daily.   ONE TOUCH LANCETS MISC Use to test blood sugar once daily   ONETOUCH VERIO test strip USE TO TEST BLOOD SUGAR 3X DAILY   TURMERIC PO Take by mouth.   No facility-administered encounter medications on file as of 03/17/2019.     Activities of Daily Living No flowsheet data found.  Patient Care Team: Mosie Lukes, MD as PCP - General (Family Medicine) Philemon Kingdom, MD as Consulting Physician (Internal Medicine) Milus Banister, MD as Consulting Physician (Gastroenterology) Eliseo Gum, MD as Consulting Physician (Psychiatry) Rolm Bookbinder, MD as Consulting Physician (Dermatology) Hayden Pedro, MD as Consulting Physician (Ophthalmology) Salvadore Dom, MD as Consulting Physician (Obstetrics and Gynecology) Darleen Crocker, MD as Consulting Physician (Ophthalmology)    Assessment:   This is a routine wellness examination for [place]. Physical assessment deferred to PCP.  Exercise Activities and Dietary recommendations   Diet (meal preparation, eat out, water intake, caffeinated beverages, dairy products, fruits and vegetables): {Desc; diets:16563} Breakfast: Lunch:  Dinner:      Goals     <enter goal here> (pt-stated)     Have less stress.     Weight (lb) < 145 lb (65.8 kg)       Fall Risk Fall Risk  05/03/2018 03/15/2018 12/21/2016 09/13/2014  Falls in the past year? Yes Yes No Yes  Number falls in past yr: 1 2 or more - 1  Injury with Fall? No No - Yes  Risk for fall due to : - History of fall(s) - -  Follow up - Education provided;Falls prevention discussed - -     Depression Screen PHQ 2/9 Scores 05/03/2018 03/15/2018 12/21/2016 09/13/2014  PHQ - 2 Score 0 0 0 0     Cognitive Function Ad8 score reviewed for issues:  Issues making decisions:  Less interest in hobbies / activities:  Repeats questions, stories (family  complaining):  Trouble using ordinary gadgets (microwave, computer, phone):  Forgets the month or year:   Mismanaging finances:   Remembering appts:  Daily problems with thinking and/or memory: Ad8 score is=        Immunization History  Administered Date(s) Administered   Hepatitis B 01/27/2013   Influenza Split 06/28/2012   Influenza, High Dose Seasonal PF 10/05/2016, 06/15/2017, 06/16/2018   Influenza,inj,Quad PF,6+ Mos 07/25/2013, 06/14/2014, 07/11/2015   Influenza-Unspecified 06/16/2018   Pneumococcal Conjugate-13 06/16/2018   Tdap 02/03/2013   Zoster 04/22/2010   Zoster Recombinat (Shingrix) 12/29/2016, 03/02/2017    Screening Tests Health Maintenance  Topic Date Due   URINE MICROALBUMIN  07/10/2016   MAMMOGRAM  07/13/2018   OPHTHALMOLOGY EXAM  12/24/2018   HEMOGLOBIN A1C  02/08/2019   FOOT EXAM  03/16/2019   INFLUENZA VACCINE  04/08/2019   PNA vac Low Risk Adult (2 of 2 - PPSV23) 06/17/2019   COLONOSCOPY  09/15/2021   TETANUS/TDAP  02/04/2023   DEXA SCAN  Completed   Hepatitis C Screening  Completed      Plan:   ***   I have personally reviewed and noted the following in the patients chart:    Medical and social history  Use of alcohol,  tobacco or illicit drugs   Current medications and supplements  Functional ability and status  Nutritional status  Physical activity  Advanced directives  List of other physicians  Hospitalizations, surgeries, and ER visits in previous 12 months  Vitals  Screenings to include cognitive, depression, and falls  Referrals and appointments  In addition, I have reviewed and discussed with patient certain preventive protocols, quality metrics, and best practice recommendations. A written personalized care plan for preventive services as well as general preventive health recommendations were provided to patient.     Shela Nevin, [place]  03/16/2019

## 2019-03-17 ENCOUNTER — Ambulatory Visit: Payer: Medicare HMO | Admitting: *Deleted

## 2019-03-17 ENCOUNTER — Other Ambulatory Visit: Payer: Self-pay

## 2019-03-18 DIAGNOSIS — R69 Illness, unspecified: Secondary | ICD-10-CM | POA: Diagnosis not present

## 2019-03-20 NOTE — Progress Notes (Signed)
Virtual Visit via Video Note  I connected with patient on 03/21/19 at  8:00 AM EDT by audio enabled telemedicine application and verified that I am speaking with the correct person using two identifiers.   THIS ENCOUNTER IS A VIRTUAL VISIT DUE TO COVID-19 - PATIENT WAS NOT SEEN IN THE OFFICE. PATIENT HAS CONSENTED TO VIRTUAL VISIT / TELEMEDICINE VISIT   Location of patient: home  Location of provider: office  I discussed the limitations of evaluation and management by telemedicine and the availability of in person appointments. The patient expressed understanding and agreed to proceed.   Subjective:   Destiny Romero is a 68 y.o. female who presents for Medicare Annual (Subsequent) preventive examination.  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   Review of Systems: No ROS.  Medicare Wellness Virtual Visit.  Visual/audio telehealth visit, UTA vital signs.   See social history for additional risk factors. Cardiac Risk Factors include: advanced age (>14men, >34 women);diabetes mellitus;dyslipidemia Sleep patterns: Sleeping well.  Home Safety/Smoke Alarms: Feels safe in home. Smoke alarms in place.  Lives alone in 1 story home.   Female:    Mammo- pt states she had done 07/14/2018 at [facility]. Reports she will fill out record release form at PCP appt 05/11/19 Dexa scan- 04/26/17        CCS- 09/15/16. 5 yr recall     Objective:    Advanced Directives 03/21/2019 03/15/2018 12/21/2016 09/15/2016 09/06/2014  Does Patient Have a Medical Advance Directive? Yes Yes Yes Yes Yes  Type of Paramedic of [place];Living will [place];Living will [place];Living will - Living will;Out of facility DNR (pink MOST or yellow form)  Does patient want to make changes to medical advance directive? No - Patient declined - - - -  Copy of [employer] in Chart? No - copy requested No - copy requested No - copy requested - -     Tobacco Social History   Tobacco Use  Smoking Status Never Smoker  Smokeless Tobacco Never Used     Counseling given: Not Answered   Clinical Intake: Pain : No/denies pain    Past Medical History:  Diagnosis Date  . Allergy   . Anal lesion 2007  . Anemia   . Anesthesia complication    Per pt/ sensitive to sedation!  . Central retinal artery occlusion 06/08/2016   limited vision right eye.  Marland Kitchen Cervical dysplasia 04/1991   Dr Rica Koyanagi - CIN I, cone and freeze in 1990s with normal paps since  . Constipation 06/08/2016   moves bowels every 3 rd day   . Decreased visual acuity 06/08/2016   Retinal bleed right eye 2017  . Depression   . Fatigue 06/27/2017  . H/O measles   . H/O mumps   . History of chicken pox   . Mitral valve prolapse    NO MR; no SBE prophylaxis needed  . PONV (postoperative nausea and vomiting)   . Pre-diabetes    highest A1c 6.4 %  . Preventative health care 12/21/2016  . Squamous cell carcinoma in situ of skin    Dr Syble Creek  . Vision impairment    limited vision in right eye.   Past Surgical History:  Procedure Laterality Date  . CERVICAL CONE BIOPSY    . COLONOSCOPY  2003  . COLONOSCOPY  04/01/2011   [place] - repeat in 5 years  . COLONOSCOPY W/ POLYPECTOMY  2007   Dr  Medoff  . EYE SURGERY Bilateral    cataracts  . LAMINECTOMY     1982  . OOPHORECTOMY Left 12/2011   Dr Ubaldo Glassing ; ovarian cyst  . RECTAL SURGERY  05/28/06   pre-canerous lesion removed in 2007; Dr Morton Stall, [facility]  . rt foot morton's surgery    . TUBAL LIGATION     Family History  Problem Relation Age of Onset  . Diabetes Mother   . Dementia Mother   . Prostate cancer Father   . COPD Father   . Heart disease Father        CAD - used nitroglycerin tablets  . Diabetes Sister        obese  . Thyroid cancer Sister   . Diabetes Brother        not obese  . Lymphoma Brother        NHL  . Cancer Brother        non Hodgkin's Lymphoma  . Diabetes Maternal Grandmother         IDDM  . Diabetes Paternal Grandmother        IDDM  . Heart disease Paternal Grandmother   . Diabetes Maternal Aunt        X5 ; all IDDM  . Heart disease Maternal Aunt        "enlarged heart"  . Heart disease Maternal Uncle        "enlarged heart"  . Heart disease Paternal Grandfather        CAD - used nitroglycerin tablets  . Diabetes Brother   . Colon cancer Neg Hx   . Esophageal cancer Neg Hx   . Stomach cancer Neg Hx   . Stroke Neg Hx    Social History   Socioeconomic History  . Marital status: Single    Spouse name: Not on file  . Number of children: Not on file  . Years of education: Not on file  . Highest education level: Not on file  Occupational History  . Not on file  Social Needs  . Financial resource strain: Not on file  . Food insecurity    Worry: Not on file    Inability: Not on file  . Transportation needs    Medical: Not on file    Non-medical: Not on file  Tobacco Use  . Smoking status: Never Smoker  . Smokeless tobacco: Never Used  Substance and Sexual Activity  . Alcohol use: Yes    Comment: glass wine once a month  . Drug use: No  . Sexual activity: Never    Partners: Male    Birth control/protection: Abstinence  Lifestyle  . Physical activity    Days per week: Not on file    Minutes per session: Not on file  . Stress: Not on file  Relationships  . Social Herbalist on phone: Not on file    Gets together: Not on file    Attends religious service: Not on file    Active member of club or organization: Not on file    Attends meetings of clubs or organizations: Not on file    Relationship status: Not on file  Other Topics Concern  . Not on file  Social History Narrative   Lives alone   No dietary restrictions    Outpatient Encounter Medications as of 03/21/2019  Medication Sig  . Calcium 200 MG TABS Take by mouth. Tale [phone] mg daily  . cetirizine (ZYRTEC) 10 MG tablet Take 10  mg by mouth daily.  . Cholecalciferol  (VITAMIN D) 2000 units CAPS   . Evening Primrose Oil 1000 MG CAPS   . FERROCITE 324 MG TABS tablet TAKE 1 TABLET (106 MG OF IRON TOTAL) BY MOUTH DAILY.  Marland Kitchen lamoTRIgine (LAMICTAL) 200 MG tablet Take 200 mg by mouth daily.  . naproxen (NAPROSYN) 500 MG tablet Take 500 mg by mouth as needed.  Marland Kitchen omega-3 acid ethyl esters (LOVAZA) 1 g capsule Take by mouth daily.  . ONE TOUCH LANCETS MISC Use to test blood sugar once daily  . ONETOUCH VERIO test strip USE TO TEST BLOOD SUGAR 3X DAILY  . atorvastatin (LIPITOR) 10 MG tablet Take 1 tablet (10 mg total) by mouth daily. (Patient not taking: Reported on 03/21/2019)  . TURMERIC PO Take by mouth.   No facility-administered encounter medications on file as of 03/21/2019.     Activities of Daily Living In your present state of health, do you have any difficulty performing the following activities: 03/21/2019  Hearing? N  Vision? N  Difficulty concentrating or making decisions? N  Walking or climbing stairs? N  Dressing or bathing? N  Doing errands, shopping? N  Preparing Food and eating ? N  Using the Toilet? N  In the past six months, have you accidently leaked urine? N  Do you have problems with loss of bowel control? N  Managing your Medications? N  Managing your Finances? N  Housekeeping or managing your Housekeeping? N  Some recent data might be hidden    Patient Care Team: Mosie Lukes, MD as PCP - General (Family Medicine) Philemon Kingdom, MD as Consulting Physician (Internal Medicine) Milus Banister, MD as Consulting Physician (Gastroenterology) Eliseo Gum, MD as Consulting Physician (Psychiatry) Rolm Bookbinder, MD as Consulting Physician (Dermatology) Hayden Pedro, MD as Consulting Physician (Ophthalmology) Salvadore Dom, MD as Consulting Physician (Obstetrics and Gynecology) Darleen Crocker, MD as Consulting Physician (Ophthalmology)    Assessment:   This is a routine wellness examination for Destiny Romero. Physical  assessment deferred to PCP.  Exercise Activities and Dietary recommendations Current Exercise Habits: Home exercise routine, Type of exercise: walking, Time (Minutes): 45, Frequency (Times/Week): 3, Weekly Exercise (Minutes/Week): 135, Intensity: Mild, Exercise limited by: None identified   Diet (meal preparation, eat out, water intake, caffeinated beverages, dairy products, fruits and vegetables): in general, a "healthy" diet  , well balanced, on average, 3 meals per day Breakfast: egg and grapefruit Lunch: vegetables Dinner:    Salad and watermelon  Goals    . <enter goal here> (pt-stated)     Have less stress.    . Weight (lb) < 145 lb (65.8 kg)       Fall Risk Fall Risk  03/21/2019 05/03/2018 03/15/2018 12/21/2016 09/13/2014  Falls in the past year? 0 Yes Yes No Yes  Number falls in past yr: 0 1 2 or more - 1  Injury with Fall? - No No - Yes  Risk for fall due to : - - History of fall(s) - -  Follow up - - Education provided;Falls prevention discussed - -    Depression Screen PHQ 2/9 Scores 03/21/2019 05/03/2018 03/15/2018 12/21/2016  PHQ - 2 Score 2 0 0 0  PHQ- 9 Score 8 - - -     Cognitive Function Ad8 score reviewed for issues:  Issues making decisions:no  Less interest in hobbies / activities:no  Repeats questions, stories (family complaining):no  Trouble using ordinary gadgets (microwave, computer, phone):no  Forgets the month  or year: no  Mismanaging finances: no  Remembering appts:no  Daily problems with thinking and/or memory:no Ad8 score is=0         Immunization History  Administered Date(s) Administered  . Hepatitis B 01/27/2013  . Influenza Split 06/28/2012  . Influenza, High Dose Seasonal PF 10/05/2016, 06/15/2017, 06/16/2018  . Influenza,inj,Quad PF,6+ Mos 07/25/2013, 06/14/2014, 07/11/2015  . Influenza-Unspecified 06/16/2018  . Pneumococcal Conjugate-13 06/16/2018  . Tdap 02/03/2013  . Zoster 04/22/2010  . Zoster Recombinat (Shingrix)  12/29/2016, 03/02/2017    Screening Tests Health Maintenance  Topic Date Due  . URINE MICROALBUMIN  07/10/2016  . MAMMOGRAM  07/13/2018  . OPHTHALMOLOGY EXAM  12/24/2018  . HEMOGLOBIN A1C  02/08/2019  . FOOT EXAM  03/16/2019  . INFLUENZA VACCINE  04/08/2019  . PNA vac Low Risk Adult (2 of 2 - PPSV23) 06/17/2019  . COLONOSCOPY  09/15/2021  . TETANUS/TDAP  02/04/2023  . DEXA SCAN  Completed  . Hepatitis C Screening  Completed       Plan:   See you next year!  Continue to eat heart healthy diet (full of fruits, vegetables, whole grains, lean protein, water--limit salt, fat, and sugar intake) and increase physical activity as tolerated.  Continue doing brain stimulating activities (puzzles, reading, adult coloring books, staying active) to keep memory sharp.   Bring a copy of your living will and/or healthcare power of attorney to your next office visit.  Try video chats with friends to lift your spirits.   I have personally reviewed and noted the following in the patient's chart:   . Medical and social history . Use of alcohol, tobacco or illicit drugs  . Current medications and supplements . Functional ability and status . Nutritional status . Physical activity . Advanced directives . List of other physicians . Hospitalizations, surgeries, and ER visits in previous 12 months . Vitals . Screenings to include cognitive, depression, and falls . Referrals and appointments  In addition, I have reviewed and discussed with patient certain preventive protocols, quality metrics, and best practice recommendations. A written personalized care plan for preventive services as well as general preventive health recommendations were provided to patient.     Shela Nevin, [place]  03/21/2019

## 2019-03-21 ENCOUNTER — Encounter: Payer: Self-pay | Admitting: *Deleted

## 2019-03-21 ENCOUNTER — Ambulatory Visit (INDEPENDENT_AMBULATORY_CARE_PROVIDER_SITE_OTHER): Payer: Medicare HMO | Admitting: *Deleted

## 2019-03-21 ENCOUNTER — Other Ambulatory Visit: Payer: Self-pay

## 2019-03-21 DIAGNOSIS — Z0000 Encounter for general adult medical examination without abnormal findings: Secondary | ICD-10-CM

## 2019-03-21 NOTE — Patient Instructions (Addendum)
See you next year!  Continue to eat heart healthy diet (full of fruits, vegetables, whole grains, lean protein, water--limit salt, fat, and sugar intake) and increase physical activity as tolerated.  Continue doing brain stimulating activities (puzzles, reading, adult coloring books, staying active) to keep memory sharp.   Bring a copy of your living will and/or healthcare power of attorney to your next office visit.  Try video chats with friends to lift your spirits.  Destiny Romero , Thank you for taking time to come for your Medicare Wellness Visit. I appreciate your ongoing commitment to your health goals. Please review the following plan we discussed and let me know if I can assist you in the future.   These are the goals we discussed: Goals    . <enter goal here> (pt-stated)     Have less stress.    . Weight (lb) < 145 lb (65.8 kg)       This is a list of the screening recommended for you and due dates:  Health Maintenance  Topic Date Due  . Urine Protein Check  07/10/2016  . Mammogram  07/13/2018  . Eye exam for diabetics  12/24/2018  . Hemoglobin A1C  02/08/2019  . Complete foot exam   03/16/2019  . Flu Shot  04/08/2019  . Pneumonia vaccines (2 of 2 - PPSV23) 06/17/2019  . Colon Cancer Screening  09/15/2021  . Tetanus Vaccine  02/04/2023  . DEXA scan (bone density measurement)  Completed  .  Hepatitis C: One time screening is recommended by Center for Disease Control  (CDC) for  adults born from [birth year] through 1967.   Completed    Health Maintenance After Age 5 After age 74, you are at a higher risk for certain long-term diseases and infections as well as injuries from falls. Falls are a major cause of broken bones and head injuries in people who are older than age 43. Getting regular preventive care can help to keep you healthy and well. Preventive care includes getting regular testing and making lifestyle changes as recommended by your health care provider. Talk with your  health care provider about:  Which screenings and tests you should have. A screening is a test that checks for a disease when you have no symptoms.  A diet and exercise plan that is right for you. What should I know about screenings and tests to prevent falls? Screening and testing are the best ways to find a health problem early. Early diagnosis and treatment give you the best chance of managing medical conditions that are common after age 51. Certain conditions and lifestyle choices may make you more likely to have a fall. Your health care provider may recommend:  Regular vision checks. Poor vision and conditions such as cataracts can make you more likely to have a fall. If you wear glasses, make sure to get your prescription updated if your vision changes.  Medicine review. Work with your health care provider to regularly review all of the medicines you are taking, including over-the-counter medicines. Ask your health care provider about any side effects that may make you more likely to have a fall. Tell your health care provider if any medicines that you take make you feel dizzy or sleepy.  Osteoporosis screening. Osteoporosis is a condition that causes the bones to get weaker. This can make the bones weak and cause them to break more easily.  Blood pressure screening. Blood pressure changes and medicines to control blood pressure can make you  feel dizzy.  Strength and balance checks. Your health care provider may recommend certain tests to check your strength and balance while standing, walking, or changing positions.  Foot health exam. Foot pain and numbness, as well as not wearing proper footwear, can make you more likely to have a fall.  Depression screening. You may be more likely to have a fall if you have a fear of falling, feel emotionally low, or feel unable to do activities that you used to do.  Alcohol use screening. Using too much alcohol can affect your balance and may make you  more likely to have a fall. What actions can I take to lower my risk of falls? General instructions  Talk with your health care provider about your risks for falling. Tell your health care provider if: ? You fall. Be sure to tell your health care provider about all falls, even ones that seem minor. ? You feel dizzy, sleepy, or off-balance.  Take over-the-counter and prescription medicines only as told by your health care provider. These include any supplements.  Eat a healthy diet and maintain a healthy weight. A healthy diet includes low-fat dairy products, low-fat (lean) meats, and fiber from whole grains, beans, and lots of fruits and vegetables. Home safety  Remove any tripping hazards, such as rugs, cords, and clutter.  Install safety equipment such as grab bars in bathrooms and safety rails on stairs.  Keep rooms and walkways well-lit. Activity   Follow a regular exercise program to stay fit. This will help you maintain your balance. Ask your health care provider what types of exercise are appropriate for you.  If you need a cane or walker, use it as recommended by your health care provider.  Wear supportive shoes that have nonskid soles. Lifestyle  Do not drink alcohol if your health care provider tells you not to drink.  If you drink alcohol, limit how much you have: ? 0-1 drink a day for women. ? 0-2 drinks a day for men.  Be aware of how much alcohol is in your drink. In the [place] one drink equals one typical bottle of beer (12 oz), one-half glass of wine (5 oz), or one shot of hard liquor (1 oz).  Do not use any products that contain nicotine or tobacco, such as cigarettes and e-cigarettes. If you need help quitting, ask your health care provider. Summary  Having a healthy lifestyle and getting preventive care can help to protect your health and wellness after age 7.  Screening and testing are the best way to find a health problem early and help you avoid having  a fall. Early diagnosis and treatment give you the best chance for managing medical conditions that are more common for people who are older than age 70.  Falls are a major cause of broken bones and head injuries in people who are older than age 60. Take precautions to prevent a fall at home.  Work with your health care provider to learn what changes you can make to improve your health and wellness and to prevent falls. This information is not intended to replace advice given to you by your health care provider. Make sure you discuss any questions you have with your health care provider. Document Released: 07/07/2017 Document Revised: 12/15/2018 Document Reviewed: 07/07/2017 Elsevier Patient Education  2020 [employer].

## 2019-04-03 DIAGNOSIS — M65342 Trigger finger, left ring finger: Secondary | ICD-10-CM | POA: Diagnosis not present

## 2019-04-03 DIAGNOSIS — M720 Palmar fascial fibromatosis [Dupuytren]: Secondary | ICD-10-CM | POA: Diagnosis not present

## 2019-04-12 ENCOUNTER — Other Ambulatory Visit: Payer: Self-pay | Admitting: Family Medicine

## 2019-04-19 DIAGNOSIS — R69 Illness, unspecified: Secondary | ICD-10-CM | POA: Diagnosis not present

## 2019-05-05 ENCOUNTER — Other Ambulatory Visit: Payer: Self-pay

## 2019-05-10 ENCOUNTER — Other Ambulatory Visit: Payer: Self-pay

## 2019-05-11 ENCOUNTER — Ambulatory Visit (INDEPENDENT_AMBULATORY_CARE_PROVIDER_SITE_OTHER): Payer: Medicare HMO | Admitting: Family Medicine

## 2019-05-11 VITALS — BP 100/68 | HR 62 | Temp 97.6°F | Resp 18 | Wt 161.2 lb

## 2019-05-11 DIAGNOSIS — Z23 Encounter for immunization: Secondary | ICD-10-CM | POA: Diagnosis not present

## 2019-05-11 DIAGNOSIS — M8580 Other specified disorders of bone density and structure, unspecified site: Secondary | ICD-10-CM | POA: Diagnosis not present

## 2019-05-11 DIAGNOSIS — Z0000 Encounter for general adult medical examination without abnormal findings: Secondary | ICD-10-CM

## 2019-05-11 DIAGNOSIS — E11319 Type 2 diabetes mellitus with unspecified diabetic retinopathy without macular edema: Secondary | ICD-10-CM | POA: Diagnosis not present

## 2019-05-11 DIAGNOSIS — T7840XD Allergy, unspecified, subsequent encounter: Secondary | ICD-10-CM

## 2019-05-11 DIAGNOSIS — Z0001 Encounter for general adult medical examination with abnormal findings: Secondary | ICD-10-CM

## 2019-05-11 DIAGNOSIS — R0789 Other chest pain: Secondary | ICD-10-CM

## 2019-05-11 DIAGNOSIS — E785 Hyperlipidemia, unspecified: Secondary | ICD-10-CM | POA: Diagnosis not present

## 2019-05-11 LAB — COMPREHENSIVE METABOLIC PANEL
ALT: 18 U/L (ref 0–35)
AST: 21 U/L (ref 0–37)
Albumin: 4.6 g/dL (ref 3.5–5.2)
Alkaline Phosphatase: 47 U/L (ref 39–117)
BUN: 17 mg/dL (ref 6–23)
CO2: 32 mEq/L (ref 19–32)
Calcium: 9.8 mg/dL (ref 8.4–10.5)
Chloride: 103 mEq/L (ref 96–112)
Creatinine, Ser: 0.99 mg/dL (ref 0.40–1.20)
GFR: 55.49 mL/min — ABNORMAL LOW (ref >60.00)
Glucose, Bld: 130 mg/dL — ABNORMAL HIGH (ref 70–99)
Potassium: 4.8 mEq/L (ref 3.5–5.1)
Sodium: 141 mEq/L (ref 135–145)
Total Bilirubin: 0.7 mg/dL (ref 0.2–1.2)
Total Protein: 7.1 g/dL (ref 6.0–8.3)

## 2019-05-11 LAB — CBC WITH DIFFERENTIAL/PLATELET
Basophils Absolute: 0 10*3/uL (ref 0.0–0.1)
Basophils Relative: 0.6 % (ref 0.0–3.0)
Eosinophils Absolute: 0.5 10*3/uL (ref 0.0–0.7)
Eosinophils Relative: 7.4 % — ABNORMAL HIGH (ref 0.0–5.0)
HCT: 39.6 % (ref 36.0–46.0)
Hemoglobin: 13 g/dL (ref 12.0–15.0)
Lymphocytes Relative: 50.1 % — ABNORMAL HIGH (ref 12.0–46.0)
Lymphs Abs: 3.5 10*3/uL (ref 0.7–4.0)
MCHC: 32.8 g/dL (ref 30.0–36.0)
MCV: 90.8 fl (ref 78.0–100.0)
Monocytes Absolute: 0.6 10*3/uL (ref 0.1–1.0)
Monocytes Relative: 8.7 % (ref 3.0–12.0)
Neutro Abs: 2.3 10*3/uL (ref 1.4–7.7)
Neutrophils Relative %: 33.2 % — ABNORMAL LOW (ref 43.0–77.0)
Platelets: 266 10*3/uL (ref 150.0–400.0)
RBC: 4.36 Mil/uL (ref 3.87–5.11)
RDW: 13.1 % (ref 11.5–15.5)
WBC: 6.9 10*3/uL (ref 4.0–10.5)

## 2019-05-11 LAB — TSH: TSH: 3.09 u[IU]/mL (ref 0.35–4.50)

## 2019-05-11 LAB — LIPID PANEL
Cholesterol: 239 mg/dL — ABNORMAL HIGH (ref 0–200)
HDL: 88.9 mg/dL (ref >39.00)
LDL Cholesterol: 137 mg/dL — ABNORMAL HIGH (ref 0–99)
NonHDL: 149.89
Total CHOL/HDL Ratio: 3
Triglycerides: 65 mg/dL (ref 0.0–149.0)
VLDL: 13 mg/dL (ref 0.0–40.0)

## 2019-05-11 LAB — HEMOGLOBIN A1C: Hgb A1c MFr Bld: 7.2 % — ABNORMAL HIGH (ref 4.6–6.5)

## 2019-05-11 MED ORDER — FERROUS FUMARATE 324 (106 FE) MG PO TABS: 1.0000 | ORAL_TABLET | Freq: Every day | ORAL | 1 refills | Status: DC

## 2019-05-11 NOTE — Assessment & Plan Note (Signed)
Encouraged heart healthy diet, increase exercise, avoid trans fats, consider a krill oil cap daily. She has declined statins in the past despite high numbers. Will check lipids today

## 2019-05-11 NOTE — Assessment & Plan Note (Signed)
hgba1c acceptable, minimize simple carbs. Increase exercise as tolerated. Continue current meds 

## 2019-05-11 NOTE — Patient Instructions (Addendum)
[employer]ron Blood pressure cuff  Upper arm  Pulse Oximeter  Check vitals weekly  Medify air purifier   CoQ10 enzyme daily   Crestor/Rosuvastatin is the water soluble statin to try if insurance will let us  Preventive Care 65 Years and Older, Female Preventive care refers to lifestyle choices and visits with your health care provider that can promote health and wellness. This includes:  A yearly physical exam. This is also called an annual well check.  Regular dental and eye exams.  Immunizations.  Screening for certain conditions.  Healthy lifestyle choices, such as diet and exercise. What can I expect for my preventive care visit? Physical exam Your health care provider will check:  Height and weight. These may be used to calculate body mass index (BMI), which is a measurement that tells if you are at a healthy weight.  Heart rate and blood pressure.  Your skin for abnormal spots. Counseling Your health care provider may ask you questions about:  Alcohol, tobacco, and drug use.  Emotional well-being.  Home and relationship well-being.  Sexual activity.  Eating habits.  History of falls.  Memory and ability to understand (cognition).  Work and work Statistician.  Pregnancy and menstrual history. What immunizations do I need?  Influenza (flu) vaccine  This is recommended every year. Tetanus, diphtheria, and pertussis (Tdap) vaccine  You may need a Td booster every 10 years. Varicella (chickenpox) vaccine  You may need this vaccine if you have not already been vaccinated. Zoster (shingles) vaccine  You may need this after age 47. Pneumococcal conjugate (PCV13) vaccine  One dose is recommended after age 95. Pneumococcal polysaccharide (PPSV23) vaccine  One dose is recommended after age 16. Measles, mumps, and rubella (MMR) vaccine  You may need at least one dose of MMR if you were born in 1957 or later. You may also need a second dose. Meningococcal  conjugate (MenACWY) vaccine  You may need this if you have certain conditions. Hepatitis A vaccine  You may need this if you have certain conditions or if you travel or work in places where you may be exposed to hepatitis A. Hepatitis B vaccine  You may need this if you have certain conditions or if you travel or work in places where you may be exposed to hepatitis B. Haemophilus influenzae type b (Hib) vaccine  You may need this if you have certain conditions. You may receive vaccines as individual doses or as more than one vaccine together in one shot (combination vaccines). Talk with your health care provider about the risks and benefits of combination vaccines. What tests do I need? Blood tests  Lipid and cholesterol levels. These may be checked every 5 years, or more frequently depending on your overall health.  Hepatitis C test.  Hepatitis B test. Screening  Lung cancer screening. You may have this screening every year starting at age 44 if you have a 30-pack-year history of smoking and currently smoke or have quit within the past 15 years.  Colorectal cancer screening. All adults should have this screening starting at age 91 and continuing until age 48. Your health care provider may recommend screening at age 34 if you are at increased risk. You will have tests every 1-10 years, depending on your results and the type of screening test.  Diabetes screening. This is done by checking your blood sugar (glucose) after you have not eaten for a while (fasting). You may have this done every 1-3 years.  Mammogram. This may be  done every 1-2 years. Talk with your health care provider about how often you should have regular mammograms.  BRCA-related cancer screening. This may be done if you have a family history of breast, ovarian, tubal, or peritoneal cancers. Other tests  Sexually transmitted disease (STD) testing.  Bone density scan. This is done to screen for osteoporosis. You may  have this done starting at age 32. Follow these instructions at home: Eating and drinking  Eat a diet that includes fresh fruits and vegetables, whole grains, lean protein, and low-fat dairy products. Limit your intake of foods with high amounts of sugar, saturated fats, and salt.  Take vitamin and mineral supplements as recommended by your health care provider.  Do not drink alcohol if your health care provider tells you not to drink.  If you drink alcohol: ? Limit how much you have to 0-1 drink a day. ? Be aware of how much alcohol is in your drink. In the [place] one drink equals one 12 oz bottle of beer (355 mL), one 5 oz glass of wine (148 mL), or one 1 oz glass of hard liquor (44 mL). Lifestyle  Take daily care of your teeth and gums.  Stay active. Exercise for at least 30 minutes on 5 or more days each week.  Do not use any products that contain nicotine or tobacco, such as cigarettes, e-cigarettes, and chewing tobacco. If you need help quitting, ask your health care provider.  If you are sexually active, practice safe sex. Use a condom or other form of protection in order to prevent STIs (sexually transmitted infections).  Talk with your health care provider about taking a low-dose aspirin or statin. What's next?  Go to your health care provider once a year for a well check visit.  Ask your health care provider how often you should have your eyes and teeth checked.  Stay up to date on all vaccines. This information is not intended to replace advice given to you by your health care provider. Make sure you discuss any questions you have with your health care provider. Document Released: 09/20/2015 Document Revised: 08/18/2018 Document Reviewed: 08/18/2018 Elsevier Patient Education  2020 [employer].

## 2019-05-14 ENCOUNTER — Telehealth: Payer: Self-pay | Admitting: Internal Medicine

## 2019-05-15 NOTE — Assessment & Plan Note (Signed)
Patient encouraged to maintain heart healthy diet, regular exercise, adequate sleep. Consider daily probiotics. Take medications as prescribed. Labs reviewed 

## 2019-05-15 NOTE — Progress Notes (Signed)
Subjective:    Patient ID: Destiny Romero, female    DOB: 10-Mar-1951, 68 y.o.   MRN: [id number]  Chief Complaint  Patient presents with   Annual Exam    HPI Patient is in today for annual preventative exam and follow up on chronic medical concerns including diabetes, hyperlipidemia, osteopenia and more. No recent medical concerns. Or hospitalizations. She has been struggling with hand pain and working with Dr Fredna Dow. She is a Psychologist, sport and exercise well. No polyuria or polydipsia. She is trying to maintain a heart healthy diet and stay active. Denies CP/palp/SOB/HA/congestion/fevers/GI or GU c/o. Taking meds as prescribed. Is handling the stress of the pandemic on Lamictal. Notes some anhedonia but no suicidal ideation. Is maintaining contact with close family members. Is using Xyzal to manage her allergies.   Past Medical History:  Diagnosis Date   Allergy    Anal lesion 2007   Anemia    Anesthesia complication    Per pt/ sensitive to sedation!   Central retinal artery occlusion 06/08/2016   limited vision right eye.   Cervical dysplasia 04/1991   Dr Rica Koyanagi - CIN I, cone and freeze in 1990s with normal paps since   Constipation 06/08/2016   moves bowels every 3 rd day    Decreased visual acuity 06/08/2016   Retinal bleed right eye 2017   Depression    Fatigue 06/27/2017   H/O measles    H/O mumps    History of chicken pox    Mitral valve prolapse    NO MR; no SBE prophylaxis needed   PONV (postoperative nausea and vomiting)    Pre-diabetes    highest A1c 6.4 %   Preventative health care 12/21/2016   Squamous cell carcinoma in situ of skin    Dr [place]   Vision impairment    limited vision in right eye.    Past Surgical History:  Procedure Laterality Date   CERVICAL CONE BIOPSY     COLONOSCOPY  2003   COLONOSCOPY  04/01/2011   [place] - repeat in 5 years   COLONOSCOPY W/ POLYPECTOMY  2007   Dr Earlean Shawl   EYE SURGERY Bilateral    cataracts    LAMINECTOMY     1982   OOPHORECTOMY Left 12/2011   Dr Ubaldo Glassing ; ovarian cyst   RECTAL SURGERY  05/28/06   pre-canerous lesion removed in 2007; Dr Morton Stall, [facility]   rt foot morton's surgery     TUBAL LIGATION      Family History  Problem Relation Age of Onset   Diabetes Mother    Dementia Mother    Prostate cancer Father    COPD Father    Heart disease Father        CAD - used nitroglycerin tablets   Diabetes Sister        obese   Thyroid cancer Sister    Diabetes Brother        not obese   Lymphoma Brother        NHL   Cancer Brother        non Hodgkin's Lymphoma   Diabetes Maternal Grandmother        IDDM   Diabetes Paternal Grandmother        IDDM   Heart disease Paternal Grandmother    Diabetes Maternal Aunt        X5 ; all IDDM   Heart disease Maternal Aunt        "enlarged heart"   Heart disease Maternal  Uncle        "enlarged heart"   Heart disease Paternal Grandfather        CAD - used nitroglycerin tablets   Diabetes Brother    Colon cancer Neg Hx    Esophageal cancer Neg Hx    Stomach cancer Neg Hx    Stroke Neg Hx     Social History   Socioeconomic History   Marital status: Single    Spouse name: Not on file   Number of children: Not on file   Years of education: Not on file   Highest education level: Not on file  Occupational History   Not on file  Social Needs   Financial resource strain: Not on file   Food insecurity    Worry: Not on file    Inability: Not on file   Transportation needs    Medical: Not on file    Non-medical: Not on file  Tobacco Use   Smoking status: Never Smoker   Smokeless tobacco: Never Used  Substance and Sexual Activity   Alcohol use: Yes    Comment: glass wine once a month   Drug use: No   Sexual activity: Never    Partners: Male    Birth control/protection: Abstinence  Lifestyle   Physical activity    Days per week: Not on file    Minutes per session: Not on file    Stress: Not on file  Relationships   Social connections    Talks on phone: Not on file    Gets together: Not on file    Attends religious service: Not on file    Active member of club or organization: Not on file    Attends meetings of clubs or organizations: Not on file    Relationship status: Not on file   Intimate partner violence    Fear of current or ex partner: Not on file    Emotionally abused: Not on file    Physically abused: Not on file    Forced sexual activity: Not on file  Other Topics Concern   Not on file  Social History Narrative   Lives alone   No dietary restrictions    Outpatient Medications Prior to Visit  Medication Sig Dispense Refill   Calcium 200 MG TABS Take by mouth. Tale [phone] mg daily     cetirizine (ZYRTEC) 10 MG tablet Take 10 mg by mouth daily.     Cholecalciferol (VITAMIN D) 2000 units CAPS      Evening Primrose Oil 1000 MG CAPS      lamoTRIgine (LAMICTAL) 200 MG tablet Take 200 mg by mouth daily.     naproxen (NAPROSYN) 500 MG tablet Take 500 mg by mouth as needed.     omega-3 acid ethyl esters (LOVAZA) 1 g capsule Take by mouth daily.     ONE TOUCH LANCETS MISC Use to test blood sugar once daily 100 each 1   ONETOUCH VERIO test strip USE TO TEST BLOOD SUGAR 3X DAILY 100 each 3   TURMERIC PO Take by mouth.     atorvastatin (LIPITOR) 10 MG tablet Take 1 tablet (10 mg total) by mouth daily. (Patient not taking: Reported on 03/21/2019) 90 tablet 3   FERROCITE 324 MG TABS tablet TAKE 1 TABLET (106 MG OF IRON TOTAL) BY MOUTH DAILY. 90 tablet 0   No facility-administered medications prior to visit.     Allergies  Allergen Reactions   Cortisone Palpitations, Other (See Comments) and Nausea And  Vomiting    hyperactivity hyperactivity   Pseudoephedrine Other (See Comments)    tachycardia tachycardia   Atorvastatin Other (See Comments)    Low dose   Codeine    Fluoxetine Hcl Other (See Comments)   Metformin Other (See  Comments)    04/15/15 caused dizziness   Metformin And Related     04/15/15 caused dizziness   Shellfish Allergy Other (See Comments)    [other]    Review of Systems  Constitutional: Negative for chills, fever and malaise/fatigue.  HENT: Negative for congestion and hearing loss.   Eyes: Negative for discharge.  Respiratory: Negative for cough, sputum production and shortness of breath.   Cardiovascular: Negative for chest pain, palpitations and leg swelling.  Gastrointestinal: Negative for abdominal pain, blood in stool, constipation, diarrhea, heartburn, nausea and vomiting.  Genitourinary: Negative for dysuria, frequency, hematuria and urgency.  Musculoskeletal: Negative for back pain, falls and myalgias.  Skin: Negative for rash.  Neurological: Negative for dizziness, sensory change, loss of consciousness, weakness and headaches.  Endo/Heme/Allergies: Negative for environmental allergies. Does not bruise/bleed easily.  Psychiatric/Behavioral: Negative for depression and suicidal ideas. The patient is not nervous/anxious and does not have insomnia.        Objective:    Physical Exam Constitutional:      General: She is not in acute distress.    Appearance: She is well-developed.  HENT:     Head: Normocephalic and atraumatic.  Eyes:     Conjunctiva/sclera: Conjunctivae normal.  Neck:     Musculoskeletal: Neck supple.     Thyroid: No thyromegaly.  Cardiovascular:     Rate and Rhythm: Normal rate and regular rhythm.     Heart sounds: Normal heart sounds. No murmur.  Pulmonary:     Effort: Pulmonary effort is normal. No respiratory distress.     Breath sounds: Normal breath sounds.  Abdominal:     General: Bowel sounds are normal. There is no distension.     Palpations: Abdomen is soft. There is no mass.     Tenderness: There is no abdominal tenderness.  Lymphadenopathy:     Cervical: No cervical adenopathy.  Skin:    General: Skin is warm and dry.  Neurological:      Mental Status: She is alert and oriented to person, place, and time.  Psychiatric:        Behavior: Behavior normal.     BP 100/68 (BP Location: Left Arm, Patient Position: Sitting, Cuff Size: Normal)    Pulse 62    Temp 97.6 F (36.4 C) (Temporal)    Resp 18    Wt 161 lb 3.2 oz (73.1 kg)    LMP 04/07/2001 (Approximate)    SpO2 98%    BMI 27.24 kg/m  Wt Readings from Last 3 Encounters:  05/11/19 161 lb 3.2 oz (73.1 kg)  11/07/18 150 lb 9.6 oz (68.3 kg)  08/09/18 151 lb (68.5 kg)    Diabetic Foot Exam - Simple   No data filed     Lab Results  Component Value Date   WBC 6.9 05/11/2019   HGB 13.0 05/11/2019   HCT 39.6 05/11/2019   PLT 266.0 05/11/2019   GLUCOSE 130 (H) 05/11/2019   CHOL 239 (H) 05/11/2019   TRIG 65.0 05/11/2019   HDL 88.90 05/11/2019   LDLDIRECT 146.5 08/11/2013   LDLCALC 137 (H) 05/11/2019   ALT 18 05/11/2019   AST 21 05/11/2019   NA 141 05/11/2019   K 4.8 05/11/2019   CL 103  05/11/2019   CREATININE 0.99 05/11/2019   BUN 17 05/11/2019   CO2 32 05/11/2019   TSH 3.09 05/11/2019   HGBA1C 7.2 (H) 05/11/2019   MICROALBUR <0.7 07/11/2015    Lab Results  Component Value Date   TSH 3.09 05/11/2019   Lab Results  Component Value Date   WBC 6.9 05/11/2019   HGB 13.0 05/11/2019   HCT 39.6 05/11/2019   MCV 90.8 05/11/2019   PLT 266.0 05/11/2019   Lab Results  Component Value Date   NA 141 05/11/2019   K 4.8 05/11/2019   CO2 32 05/11/2019   GLUCOSE 130 (H) 05/11/2019   BUN 17 05/11/2019   CREATININE 0.99 05/11/2019   BILITOT 0.7 05/11/2019   ALKPHOS 47 05/11/2019   AST 21 05/11/2019   ALT 18 05/11/2019   PROT 7.1 05/11/2019   ALBUMIN 4.6 05/11/2019   CALCIUM 9.8 05/11/2019   GFR 55.49 (L) 05/11/2019   Lab Results  Component Value Date   CHOL 239 (H) 05/11/2019   Lab Results  Component Value Date   HDL 88.90 05/11/2019   Lab Results  Component Value Date   LDLCALC 137 (H) 05/11/2019   Lab Results  Component Value Date   TRIG 65.0  05/11/2019   Lab Results  Component Value Date   CHOLHDL 3 05/11/2019   Lab Results  Component Value Date   HGBA1C 7.2 (H) 05/11/2019       Assessment & Plan:   Problem List Items Addressed This Visit    Controlled type 2 diabetes with retinopathy ([place]) (Chronic)    hgba1c acceptable, minimize simple carbs. Increase exercise as tolerated. Continue current meds.      Relevant Orders   Hemoglobin A1c (Completed)   Comprehensive metabolic panel (Completed)   TSH (Completed)   Hyperlipidemia    Encouraged heart healthy diet, increase exercise, avoid trans fats, consider a krill oil cap daily. She has declined statins in the past despite high numbers. Will check lipids today      Relevant Orders   Lipid panel (Completed)   Osteopenia    Encouraged to get adequate exercise, calcium and vitamin d intake      Preventative health care    Patient encouraged to maintain heart healthy diet, regular exercise, adequate sleep. Consider daily probiotics. Take medications as prescribed. Labs reviewed.       Atypical chest pain   Relevant Orders   Comprehensive metabolic panel (Completed)   TSH (Completed)    Other Visit Diagnoses    Needs flu shot    -  Primary   Relevant Orders   Flu Vaccine QUAD High Dose(Fluad)   Allergic state, subsequent encounter       Relevant Orders   CBC w/Diff (Completed)      I have discontinued Jonni Sanger. Wolf's atorvastatin. I have also changed her Ferrocite to Ferrous Fumarate. Additionally, I am having her maintain her lamoTRIgine, naproxen, Calcium, omega-3 acid ethyl esters, cetirizine, TURMERIC PO, ONE TOUCH LANCETS, Vitamin D, Evening Primrose Oil, and OneTouch Verio.  Meds ordered this encounter  Medications   Ferrous Fumarate (FERROCITE) 324 (106 Fe) MG TABS tablet    Sig: Take 1 tablet (106 mg of iron total) by mouth daily.    Dispense:  90 tablet    Refill:  1     Penni Homans, MD

## 2019-05-15 NOTE — Assessment & Plan Note (Signed)
Encouraged to get adequate exercise, calcium and vitamin d intake 

## 2019-05-16 NOTE — Telephone Encounter (Signed)
OK for 3 mo - needs appt within the next 3 mo.Marland Kitchen

## 2019-05-16 NOTE — Telephone Encounter (Signed)
Last office visit 08/25/2018  Cancel/No-show? 1  Future office visit scheduled? no

## 2019-05-17 DIAGNOSIS — R69 Illness, unspecified: Secondary | ICD-10-CM | POA: Diagnosis not present

## 2019-05-17 NOTE — Telephone Encounter (Signed)
LVM to Schedule °

## 2019-05-17 NOTE — Telephone Encounter (Signed)
RX sent.  Please contact patient for follow up. 

## 2019-05-18 NOTE — Telephone Encounter (Signed)
2nd Attempt - LVM to Schedule

## 2019-05-23 ENCOUNTER — Encounter: Payer: Self-pay | Admitting: Internal Medicine

## 2019-05-23 NOTE — Telephone Encounter (Signed)
LVM to Schedule x 3   Mailing Letter

## 2019-07-12 ENCOUNTER — Other Ambulatory Visit: Payer: Self-pay

## 2019-07-12 NOTE — Progress Notes (Signed)
68 y.o. G0P0000 Single White or Caucasian Not Hispanic or Latino female here for annual exam.  No vaginal bleeding. No bowel or bladder issues.     History of high grade anal dysplasia with hpv in 2007. Anal pap and hpv were negative in 11/18.   Her depression has been worse with the isolation with covid. 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   Patient's last menstrual period was 04/07/2001 (approximate).          Sexually active: No.  The current method of family planning is post menopausal status.    Exercising: Yes.    walking  Smoker:  no  Health Maintenance: Pap:  07-01-16 WNL  History of abnormal Pap:  Yes years ago cone biopsy (over 20 years ago).   MMG: 07/13/18  Bi-rads 2  Colonoscopy: 09-14-16 WNL repeat in 5 yrs BMD:   04-26-17 osteopenia FRAX 17% and 2.5% TDaP:  02-03-13 Gardasil: N/A   reports that she has never smoked. She has never used smokeless tobacco. She reports current alcohol use. She reports that she does not use drugs. Drinks 2 glasses of ETOH a month.   Past Medical History:  Diagnosis Date  . Allergy   . Anal lesion 2007  . Anemia   . Anesthesia complication    Per pt/ sensitive to sedation!  . Central retinal artery occlusion 06/08/2016   limited vision right eye.  Marland Kitchen Cervical dysplasia 04/1991   Dr Rica Koyanagi - [facility], cone and freeze in 1990s with normal paps since  . Constipation 06/08/2016   moves bowels every 3 rd day   . Decreased visual acuity 06/08/2016   Retinal bleed right eye 2017  . Depression   . Fatigue 06/27/2017  . H/O measles   . H/O mumps   . History of chicken pox   . Mitral valve prolapse    NO MR; no SBE prophylaxis needed  . PONV (postoperative nausea and vomiting)   . Pre-diabetes    highest A1c 6.4 %  . Preventative health care 12/21/2016  . Squamous cell carcinoma in situ of skin    Dr [place]  . Vision impairment    limited vision in right eye.    Past Surgical History:  Procedure Laterality Date  . CERVICAL CONE BIOPSY    .  COLONOSCOPY  2003  . COLONOSCOPY  04/01/2011   [place] - repeat in 5 years  . COLONOSCOPY W/ POLYPECTOMY  2007   Dr Earlean Shawl  . EYE SURGERY Bilateral    cataracts  . LAMINECTOMY     1982  . OOPHORECTOMY Left 12/2011   Dr Ubaldo Glassing ; ovarian cyst  . RECTAL SURGERY  05/28/06   pre-canerous lesion removed in 2007; Dr Morton Stall, [facility]  . rt foot morton's surgery    . TUBAL LIGATION      Current Outpatient Medications  Medication Sig Dispense Refill  . Calcium 200 MG TABS Take by mouth. Tale [phone] mg daily    . cetirizine (ZYRTEC) 10 MG tablet Take 10 mg by mouth daily.    . Cholecalciferol (VITAMIN D) 2000 units CAPS     . Evening Primrose Oil 1000 MG CAPS     . Ferrous Fumarate (FERROCITE) 324 (106 Fe) MG TABS tablet Take 1 tablet (106 mg of iron total) by mouth daily. 90 tablet 1  . lamoTRIgine (LAMICTAL) 200 MG tablet Take 200 mg by mouth daily.    . naproxen (NAPROSYN) 500 MG tablet Take 500 mg by mouth as needed.    Marland Kitchen  omega-3 acid ethyl esters (LOVAZA) 1 g capsule Take by mouth daily.    . ONE TOUCH LANCETS MISC Use to test blood sugar once daily 100 each 1  . ONETOUCH VERIO test strip USE TO TEST BLOOD SUGAR 3X DAILY 100 strip 3  . TURMERIC PO Take by mouth.     No current facility-administered medications for this visit.     Family History  Problem Relation Age of Onset  . Diabetes Mother   . Dementia Mother   . Prostate cancer Father   . COPD Father   . Heart disease Father        CAD - used nitroglycerin tablets  . Diabetes Sister        obese  . Thyroid cancer Sister   . Diabetes Brother        not obese  . Lymphoma Brother        NHL  . Cancer Brother        non Hodgkin's Lymphoma  . Diabetes Maternal Grandmother        IDDM  . Diabetes Paternal Grandmother        IDDM  . Heart disease Paternal Grandmother   . Diabetes Maternal Aunt        X5 ; all IDDM  . Heart disease Maternal Aunt        "enlarged heart"  . Heart disease Maternal Uncle        "enlarged  heart"  . Heart disease Paternal Grandfather        CAD - used nitroglycerin tablets  . Diabetes Brother   . Colon cancer Neg Hx   . Esophageal cancer Neg Hx   . Stomach cancer Neg Hx   . Stroke Neg Hx     Review of Systems  All other systems reviewed and are negative.   Exam:   LMP 04/07/2001 (Approximate)   Weight change: @WEIGHTCHANGE @ Height:      Ht Readings from Last 3 Encounters:  08/09/18 5' 4.5" (1.638 m)  05/03/18 5' 4.5" (1.638 m)  03/15/18 5\' 4"  (1.626 m)    General appearance: alert, cooperative and appears stated age Head: Normocephalic, without obvious abnormality, atraumatic Neck: no adenopathy, supple, symmetrical, trachea midline and thyroid normal to inspection and palpation Lungs: clear to auscultation bilaterally Cardiovascular: regular rate and rhythm Breasts: normal appearance, no masses or tenderness, bilaterally inverted nipples, no change.  Abdomen: soft, non-tender; non distended,  no masses,  no organomegaly Extremities: extremities normal, atraumatic, no cyanosis or edema Skin: Skin color, texture, turgor normal. No rashes or lesions Lymph nodes: Cervical, supraclavicular, and axillary nodes normal. No abnormal inguinal nodes palpated Neurologic: Grossly normal   Pelvic: External genitalia:  no lesions              Urethra:  normal appearing urethra with no masses, tenderness or lesions              Bartholins and Skenes: normal                 Vagina: atrophic appearing vagina with normal color and discharge, no lesions              Cervix: no lesions               Bimanual Exam:  Uterus:  normal size, contour, position, consistency, mobility, non-tender              Adnexa: no mass, fullness, tenderness  Rectovaginal: Confirms               Anus:  normal sphincter tone, hemorrhoid noted, no skin changes  Chaperone was present for exam.  A:  Well Woman with normal exam  H/O anal dysplasia in 2017, released from f/u. Last  anal pap in 11/18 was negative with negative hpv. She was told to do colonoscopies every 5 years.  P:   Plan cervical pap every 5 years  Plan anal pap next year (no clear guidelines)  Mammogram scheduled  DEXA due, will schedule with her primary  Discussed breast self exam  Discussed calcium and vit D intake

## 2019-07-13 ENCOUNTER — Encounter: Payer: Self-pay | Admitting: Obstetrics and Gynecology

## 2019-07-13 ENCOUNTER — Ambulatory Visit (INDEPENDENT_AMBULATORY_CARE_PROVIDER_SITE_OTHER): Payer: Medicare HMO | Admitting: Obstetrics and Gynecology

## 2019-07-13 VITALS — BP 100/60 | HR 71 | Temp 97.3°F | Ht 63.75 in | Wt 165.0 lb

## 2019-07-13 DIAGNOSIS — Z01419 Encounter for gynecological examination (general) (routine) without abnormal findings: Secondary | ICD-10-CM

## 2019-07-13 NOTE — Patient Instructions (Signed)
EXERCISE AND DIET:  We recommended that you start or continue a regular exercise program for good health. Regular exercise means any activity that makes your heart beat faster and makes you sweat.  We recommend exercising at least 30 minutes per day at least 3 days a week, preferably 4 or 5.  We also recommend a diet low in fat and sugar.  Inactivity, poor dietary choices and obesity can cause diabetes, heart attack, stroke, and kidney damage, among others.    ALCOHOL AND SMOKING:  Women should limit their alcohol intake to no more than 7 drinks/beers/glasses of wine (combined, not each!) per week. Moderation of alcohol intake to this level decreases your risk of breast cancer and liver damage. And of course, no recreational drugs are part of a healthy lifestyle.  And absolutely no smoking or even second hand smoke. Most people know smoking can cause heart and lung diseases, but did you know it also contributes to weakening of your bones? Aging of your skin?  Yellowing of your teeth and nails?  CALCIUM AND VITAMIN D:  Adequate intake of calcium and Vitamin D are recommended.  The recommendations for exact amounts of these supplements seem to change often, but generally speaking 1,200 mg of calcium (between diet and supplement) and 800 units of Vitamin D per day seems prudent. Certain women may benefit from higher intake of Vitamin D.  If you are among these women, your doctor will have told you during your visit.    PAP SMEARS:  Pap smears, to check for cervical cancer or precancers,  have traditionally been done yearly, although recent scientific advances have shown that most women can have pap smears less often.  However, every woman still should have a physical exam from her gynecologist every year. It will include a breast check, inspection of the vulva and vagina to check for abnormal growths or skin changes, a visual exam of the cervix, and then an exam to evaluate the size and shape of the uterus and  ovaries.  And after 70 years of age, a rectal exam is indicated to check for rectal cancers. We will also provide age appropriate advice regarding health maintenance, like when you should have certain vaccines, screening for sexually transmitted diseases, bone density testing, colonoscopy, mammograms, etc.   MAMMOGRAMS:  All women over 40 years old should have a yearly mammogram. Many facilities now offer a "3D" mammogram, which may cost around $50 extra out of pocket. If possible,  we recommend you accept the option to have the 3D mammogram performed.  It both reduces the number of women who will be called back for extra views which then turn out to be normal, and it is better than the routine mammogram at detecting truly abnormal areas.    COLON CANCER SCREENING: Now recommend starting at age 45. At this time colonoscopy is not covered for routine screening until 50. There are take home tests that can be done between 45-49.   COLONOSCOPY:  Colonoscopy to screen for colon cancer is recommended for all women at age 50.  We know, you hate the idea of the prep.  We agree, BUT, having colon cancer and not knowing it is worse!!  Colon cancer so often starts as a polyp that can be seen and removed at colonscopy, which can quite literally save your life!  And if your first colonoscopy is normal and you have no family history of colon cancer, most women don't have to have it again for   10 years.  Once every ten years, you can do something that may end up saving your life, right?  We will be happy to help you get it scheduled when you are ready.  Be sure to check your insurance coverage so you understand how much it will cost.  It may be covered as a preventative service at no cost, but you should check your particular policy.      Breast Self-Awareness Breast self-awareness means being familiar with how your breasts look and feel. It involves checking your breasts regularly and reporting any changes to your  health care provider. Practicing breast self-awareness is important. A change in your breasts can be a sign of a serious medical problem. Being familiar with how your breasts look and feel allows you to find any problems early, when treatment is more likely to be successful. All women should practice breast self-awareness, including women who have had breast implants. How to do a breast self-exam One way to learn what is normal for your breasts and whether your breasts are changing is to do a breast self-exam. To do a breast self-exam: Look for Changes  1. Remove all the clothing above your waist. 2. Stand in front of a mirror in a room with good lighting. 3. Put your hands on your hips. 4. Push your hands firmly downward. 5. Compare your breasts in the mirror. Look for differences between them (asymmetry), such as: ? Differences in shape. ? Differences in size. ? Puckers, dips, and bumps in one breast and not the other. 6. Look at each breast for changes in your skin, such as: ? Redness. ? Scaly areas. 7. Look for changes in your nipples, such as: ? Discharge. ? Bleeding. ? Dimpling. ? Redness. ? A change in position. Feel for Changes Carefully feel your breasts for lumps and changes. It is best to do this while lying on your back on the floor and again while sitting or standing in the shower or tub with soapy water on your skin. Feel each breast in the following way:  Place the arm on the side of the breast you are examining above your head.  Feel your breast with the other hand.  Start in the nipple area and make  inch (2 cm) overlapping circles to feel your breast. Use the pads of your three middle fingers to do this. Apply light pressure, then medium pressure, then firm pressure. The light pressure will allow you to feel the tissue closest to the skin. The medium pressure will allow you to feel the tissue that is a little deeper. The firm pressure will allow you to feel the tissue  close to the ribs.  Continue the overlapping circles, moving downward over the breast until you feel your ribs below your breast.  Move one finger-width toward the center of the body. Continue to use the  inch (2 cm) overlapping circles to feel your breast as you move slowly up toward your collarbone.  Continue the up and down exam using all three pressures until you reach your armpit.  Write Down What You Find  Write down what is normal for each breast and any changes that you find. Keep a written record with breast changes or normal findings for each breast. By writing this information down, you do not need to depend only on memory for size, tenderness, or location. Write down where you are in your menstrual cycle, if you are still menstruating. If you are having trouble noticing differences   in your breasts, do not get discouraged. With time you will become more familiar with the variations in your breasts and more comfortable with the exam. How often should I examine my breasts? Examine your breasts every month. If you are breastfeeding, the best time to examine your breasts is after a feeding or after using a breast pump. If you menstruate, the best time to examine your breasts is 5-7 days after your period is over. During your period, your breasts are lumpier, and it may be more difficult to notice changes. When should I see my health care provider? See your health care provider if you notice:  A change in shape or size of your breasts or nipples.  A change in the skin of your breast or nipples, such as a reddened or scaly area.  Unusual discharge from your nipples.  A lump or thick area that was not there before.  Pain in your breasts.  Anything that concerns you.  

## 2019-07-24 ENCOUNTER — Encounter: Payer: Self-pay | Admitting: Obstetrics and Gynecology

## 2019-07-24 DIAGNOSIS — Z1231 Encounter for screening mammogram for malignant neoplasm of breast: Secondary | ICD-10-CM | POA: Diagnosis not present

## 2019-09-18 DIAGNOSIS — R69 Illness, unspecified: Secondary | ICD-10-CM | POA: Diagnosis not present

## 2019-10-11 DIAGNOSIS — M65321 Trigger finger, right index finger: Secondary | ICD-10-CM | POA: Diagnosis not present

## 2019-10-11 DIAGNOSIS — M79641 Pain in right hand: Secondary | ICD-10-CM | POA: Diagnosis not present

## 2019-11-01 ENCOUNTER — Ambulatory Visit (HOSPITAL_BASED_OUTPATIENT_CLINIC_OR_DEPARTMENT_OTHER)
Admission: RE | Admit: 2019-11-01 | Discharge: 2019-11-01 | Disposition: A | Discharge status: Home / Self Care | Payer: Medicare HMO | Source: Ambulatory Visit | Attending: Family Medicine | Admitting: Family Medicine

## 2019-11-01 ENCOUNTER — Other Ambulatory Visit: Payer: Self-pay

## 2019-11-01 DIAGNOSIS — R0789 Other chest pain: Secondary | ICD-10-CM | POA: Insufficient documentation

## 2019-11-01 NOTE — Progress Notes (Signed)
  Echocardiogram 2D Echocardiogram has been performed.  Destiny Romero 11/01/2019, 8:35 AM

## 2019-11-08 DIAGNOSIS — M65321 Trigger finger, right index finger: Secondary | ICD-10-CM | POA: Diagnosis not present

## 2019-11-08 DIAGNOSIS — M720 Palmar fascial fibromatosis [Dupuytren]: Secondary | ICD-10-CM | POA: Diagnosis not present

## 2019-11-08 DIAGNOSIS — M65342 Trigger finger, left ring finger: Secondary | ICD-10-CM | POA: Diagnosis not present

## 2019-11-27 ENCOUNTER — Other Ambulatory Visit: Payer: Self-pay | Admitting: *Deleted

## 2019-11-27 ENCOUNTER — Other Ambulatory Visit: Payer: Self-pay

## 2019-11-27 ENCOUNTER — Ambulatory Visit (INDEPENDENT_AMBULATORY_CARE_PROVIDER_SITE_OTHER): Payer: Medicare HMO | Admitting: Family Medicine

## 2019-11-27 VITALS — HR 70

## 2019-11-27 DIAGNOSIS — E663 Overweight: Secondary | ICD-10-CM | POA: Diagnosis not present

## 2019-11-27 DIAGNOSIS — R0789 Other chest pain: Secondary | ICD-10-CM

## 2019-11-27 DIAGNOSIS — E11319 Type 2 diabetes mellitus with unspecified diabetic retinopathy without macular edema: Secondary | ICD-10-CM

## 2019-11-27 DIAGNOSIS — D649 Anemia, unspecified: Secondary | ICD-10-CM

## 2019-11-27 DIAGNOSIS — K219 Gastro-esophageal reflux disease without esophagitis: Secondary | ICD-10-CM | POA: Diagnosis not present

## 2019-11-27 DIAGNOSIS — E785 Hyperlipidemia, unspecified: Secondary | ICD-10-CM

## 2019-11-27 DIAGNOSIS — R69 Illness, unspecified: Secondary | ICD-10-CM | POA: Diagnosis not present

## 2019-11-27 DIAGNOSIS — F418 Other specified anxiety disorders: Secondary | ICD-10-CM

## 2019-11-27 MED ORDER — GLIMEPIRIDE 1 MG PO TABS: 1.0000 mg | ORAL_TABLET | Freq: Every day | ORAL | 3 refills | Status: DC

## 2019-11-27 NOTE — Assessment & Plan Note (Signed)
Avoid offending foods, start probiotics. Do not eat large meals in late evening and consider raising head of bed. She notes this has worsened as she has gained weight and she notes this causes her chest pain at times. Advised to use cold brew coffee and attempt weight loss. Use Famotidine 20-40 mg qhs.

## 2019-11-27 NOTE — Patient Instructions (Addendum)
Encouraged DASH or MIND diet, decrease po intake and increase exercise as tolerated. Needs 7-8 hours of sleep nightly. Avoid trans fats, eat small, frequent meals every 4-5 hours with lean proteins, complex carbs and healthy fats. Minimize simple carbs. Weight watchers APP is possible   Omron Blood Pressure cuff, upper arm, want BP 100-140/60-90 Pulse oximeter, want oxygen in 90s  Weekly vitals  Take Multivitamin with minerals, selenium Vitamin D 1000-2000 IU daily Probiotic with lactobacillus and bifidophilus Asprin EC 81 mg daily Fish or krill oil daily Melatonin 2-5 mg at bedtime  [email or web address]

## 2019-11-27 NOTE — Assessment & Plan Note (Signed)
Increase leafy greens, consider increased lean red meat and using cast iron cookware. Continue to monitor, report any concerns 

## 2019-11-27 NOTE — Assessment & Plan Note (Signed)
The isolation of the pandemic has taken it's toll but she she has managed on current meds and will continue on Lamictal

## 2019-11-27 NOTE — Assessment & Plan Note (Signed)
Encouraged heart healthy diet, increase exercise, avoid trans fats, consider a krill oil cap daily 

## 2019-11-27 NOTE — Progress Notes (Signed)
Virtual Visit via Video Note  I connected with Destiny Romero on 11/27/19 at  2:00 PM EDT by a video enabled telemedicine application and verified that I am speaking with the correct person using two identifiers.  Location: Patient: home Provider: home   I discussed the limitations of evaluation and management by telemedicine and the availability of in person appointments. The patient expressed understanding and agreed to proceed. Kem Boroughs, CMA was able to get the patient set up on a visit, video   Subjective:    Patient ID: Destiny Romero, female    DOB: 1950/12/02, 69 y.o.   MRN: [id number]  Chief Complaint  Patient presents with  . Hyperglycemia    last reading this morning 149    HPI Patient is in today for follow up on chronic medical concerns. She is noting weight gain and anhedonia during the pandemic which has resulted in increased blood sugars up as high as 200 in am and lowest number of 119 in past month. Notes some polydipsia as well but polyuria. No recent febrile illness or hospitalizations. She is working on cleaning up her diet and moving more. Denies palp/SOB/HA/congestion/fevers or GU c/o. Taking meds as prescribed  Past Medical History:  Diagnosis Date  . Allergy   . Anal lesion 2007  . Anemia   . Anesthesia complication    Per pt/ sensitive to sedation!  . Central retinal artery occlusion 06/08/2016   limited vision right eye.  Marland Kitchen Cervical dysplasia 04/1991   Dr Rica Koyanagi - CIN I, cone and freeze in 1990s with normal paps since  . Constipation 06/08/2016   moves bowels every 3 rd day   . Decreased visual acuity 06/08/2016   Retinal bleed right eye 2017  . Depression   . Fatigue 06/27/2017  . H/O measles   . H/O mumps   . History of chicken pox   . Mitral valve prolapse    NO MR; no SBE prophylaxis needed  . PONV (postoperative nausea and vomiting)   . Pre-diabetes    highest A1c 6.4 %  . Preventative health care 12/21/2016  . Squamous cell  carcinoma in situ of skin    Dr [place]  . Vision impairment    limited vision in right eye.    Past Surgical History:  Procedure Laterality Date  . CERVICAL CONE BIOPSY    . COLONOSCOPY  2003  . COLONOSCOPY  04/01/2011   [facility] - repeat in 5 years  . COLONOSCOPY W/ POLYPECTOMY  2007   Dr Earlean Shawl  . EYE SURGERY Bilateral    cataracts  . LAMINECTOMY     1982  . OOPHORECTOMY Left 12/2011   Dr Ubaldo Glassing ; ovarian cyst  . RECTAL SURGERY  05/28/06   pre-canerous lesion removed in 2007; Dr Morton Stall, [facility]  . rt foot morton's surgery    . TUBAL LIGATION      Family History  Problem Relation Age of Onset  . Diabetes Mother   . Dementia Mother   . Prostate cancer Father   . COPD Father   . Heart disease Father        CAD - used nitroglycerin tablets  . Diabetes Sister        obese  . Thyroid cancer Sister   . Diabetes Brother        not obese  . Lymphoma Brother        NHL  . Cancer Brother        non Hodgkin's Lymphoma  .  Diabetes Maternal Grandmother        IDDM  . Diabetes Paternal Grandmother        IDDM  . Heart disease Paternal Grandmother   . Diabetes Maternal Aunt        X5 ; all IDDM  . Heart disease Maternal Aunt        "enlarged heart"  . Heart disease Maternal Uncle        "enlarged heart"  . Heart disease Paternal Grandfather        CAD - used nitroglycerin tablets  . Diabetes Brother   . Colon cancer Neg Hx   . Esophageal cancer Neg Hx   . Stomach cancer Neg Hx   . Stroke Neg Hx     Social History   Socioeconomic History  . Marital status: Single    Spouse name: Not on file  . Number of children: Not on file  . Years of education: Not on file  . Highest education level: Not on file  Occupational History  . Not on file  Tobacco Use  . Smoking status: Never Smoker  . Smokeless tobacco: Never Used  Substance and Sexual Activity  . Alcohol use: Yes    Comment: glass wine once a month  . Drug use: No  . Sexual activity: Never    Partners: Male     Birth control/protection: Abstinence  Other Topics Concern  . Not on file  Social History Narrative   Lives alone   No dietary restrictions   Social Determinants of Health   Financial Resource Strain:   . Difficulty of Paying Living Expenses:   Food Insecurity:   . Worried About Charity fundraiser in the Last Year:   . Arboriculturist in the Last Year:   Transportation Needs:   . Film/video editor (Medical):   Marland Kitchen Lack of Transportation (Non-Medical):   Physical Activity:   . Days of Exercise per Week:   . Minutes of Exercise per Session:   Stress:   . Feeling of Stress :   Social Connections:   . Frequency of Communication with Friends and Family:   . Frequency of Social Gatherings with Friends and Family:   . Attends Religious Services:   . Active Member of Clubs or Organizations:   . Attends Archivist Meetings:   Marland Kitchen Marital Status:   Intimate Partner Violence:   . Fear of Current or Ex-Partner:   . Emotionally Abused:   Marland Kitchen Physically Abused:   . Sexually Abused:     Outpatient Medications Prior to Visit  Medication Sig Dispense Refill  . Calcium 200 MG TABS Take by mouth. Tale [phone] mg daily    . cetirizine (ZYRTEC) 10 MG tablet Take 10 mg by mouth daily.    . Cholecalciferol (VITAMIN D) 2000 units CAPS     . Evening Primrose Oil 1000 MG CAPS     . Ferrous Fumarate (FERROCITE) 324 (106 Fe) MG TABS tablet Take 1 tablet (106 mg of iron total) by mouth daily. 90 tablet 1  . lamoTRIgine (LAMICTAL) 200 MG tablet Take 200 mg by mouth daily.    . naproxen (NAPROSYN) 500 MG tablet Take 500 mg by mouth as needed.    Marland Kitchen omega-3 acid ethyl esters (LOVAZA) 1 g capsule Take by mouth daily.    . ONE TOUCH LANCETS MISC Use to test blood sugar once daily 100 each 1  . ONETOUCH VERIO test strip USE TO TEST BLOOD SUGAR  3X DAILY 100 strip 3  . TURMERIC PO Take by mouth.     No facility-administered medications prior to visit.    Allergies  Allergen Reactions  .  Cortisone Palpitations, Other (See Comments) and Nausea And Vomiting    hyperactivity hyperactivity  . Pseudoephedrine Other (See Comments)    tachycardia tachycardia  . Atorvastatin Other (See Comments)    Low dose  . Codeine   . Fluoxetine Hcl Other (See Comments)  . Metformin Other (See Comments)    04/15/15 caused dizziness  . Metformin And Related     04/15/15 caused dizziness  . Shellfish Allergy Other (See Comments)    [other]    Review of Systems  Constitutional: Positive for malaise/fatigue. Negative for fever.  HENT: Negative for congestion.   Eyes: Negative for blurred vision.  Respiratory: Negative for shortness of breath.   Cardiovascular: Positive for chest pain. Negative for palpitations and leg swelling.  Gastrointestinal: Positive for heartburn. Negative for abdominal pain, blood in stool and nausea.  Genitourinary: Negative for dysuria and frequency.  Musculoskeletal: Negative for falls.  Skin: Negative for rash.  Neurological: Negative for dizziness, loss of consciousness and headaches.  Endo/Heme/Allergies: Negative for environmental allergies.  Psychiatric/Behavioral: Positive for depression. The patient is not nervous/anxious.        Objective:    Physical Exam Constitutional:      Appearance: Normal appearance. She is not ill-appearing.  HENT:     Head: Normocephalic and atraumatic.     Right Ear: External ear normal.     Left Ear: External ear normal.     Nose: Nose normal.  Eyes:     General:        Right eye: No discharge.        Left eye: No discharge.  Pulmonary:     Effort: Pulmonary effort is normal.  Neurological:     Mental Status: She is alert and oriented to person, place, and time.  Psychiatric:        Behavior: Behavior normal.     Pulse 70   LMP 04/07/2001 (Approximate)  Wt Readings from Last 3 Encounters:  07/13/19 165 lb (74.8 kg)  05/11/19 161 lb 3.2 oz (73.1 kg)  11/07/18 150 lb 9.6 oz (68.3 kg)    Diabetic Foot  Exam - Simple   No data filed     Lab Results  Component Value Date   WBC 6.9 05/11/2019   HGB 13.0 05/11/2019   HCT 39.6 05/11/2019   PLT 266.0 05/11/2019   GLUCOSE 130 (H) 05/11/2019   CHOL 239 (H) 05/11/2019   TRIG 65.0 05/11/2019   HDL 88.90 05/11/2019   LDLDIRECT 146.5 08/11/2013   LDLCALC 137 (H) 05/11/2019   ALT 18 05/11/2019   AST 21 05/11/2019   NA 141 05/11/2019   K 4.8 05/11/2019   CL 103 05/11/2019   CREATININE 0.99 05/11/2019   BUN 17 05/11/2019   CO2 32 05/11/2019   TSH 3.09 05/11/2019   HGBA1C 7.2 (H) 05/11/2019   MICROALBUR <0.7 07/11/2015    Lab Results  Component Value Date   TSH 3.09 05/11/2019   Lab Results  Component Value Date   WBC 6.9 05/11/2019   HGB 13.0 05/11/2019   HCT 39.6 05/11/2019   MCV 90.8 05/11/2019   PLT 266.0 05/11/2019   Lab Results  Component Value Date   NA 141 05/11/2019   K 4.8 05/11/2019   CO2 32 05/11/2019   GLUCOSE 130 (H) 05/11/2019   BUN  17 05/11/2019   CREATININE 0.99 05/11/2019   BILITOT 0.7 05/11/2019   ALKPHOS 47 05/11/2019   AST 21 05/11/2019   ALT 18 05/11/2019   PROT 7.1 05/11/2019   ALBUMIN 4.6 05/11/2019   CALCIUM 9.8 05/11/2019   GFR 55.49 (L) 05/11/2019   Lab Results  Component Value Date   CHOL 239 (H) 05/11/2019   Lab Results  Component Value Date   HDL 88.90 05/11/2019   Lab Results  Component Value Date   LDLCALC 137 (H) 05/11/2019   Lab Results  Component Value Date   TRIG 65.0 05/11/2019   Lab Results  Component Value Date   CHOLHDL 3 05/11/2019   Lab Results  Component Value Date   HGBA1C 7.2 (H) 05/11/2019       Assessment & Plan:   Problem List Items Addressed This Visit    Controlled type 2 diabetes with retinopathy ([place]) - Primary (Chronic)    Sugar have trended up over the past few months. Often seeing numbers between 170 and 200 in the morning and endorsing some polydipsia. Does not tolerate Metformin so we will add Glimeperide 1 mg daily and recheck hgba1c.  minimize simple carbs. Increase exercise as tolerated.      Relevant Medications   glimepiride (AMARYL) 1 MG tablet   Other Relevant Orders   Comprehensive metabolic panel   Hemoglobin A1c   TSH   Hyperlipidemia    Encouraged heart healthy diet, increase exercise, avoid trans fats, consider a krill oil cap daily      Relevant Orders   Lipid panel   TSH   Depression with anxiety    The isolation of the pandemic has taken it's toll but she she has managed on current meds and will continue on Lamictal      Anemia    Increase leafy greens, consider increased lean red meat and using cast iron cookware. Continue to monitor, report any concerns      Atypical chest pain   Relevant Orders   TSH   Overweight    Encouraged DASH or MIND diet, decrease po intake and increase exercise as tolerated. Needs 7-8 hours of sleep nightly. Avoid trans fats, eat small, frequent meals every 4-5 hours with lean proteins, complex carbs and healthy fats. Minimize simple carbs, consider [employer] APP      Acid reflux    Avoid offending foods, start probiotics. Do not eat large meals in late evening and consider raising head of bed. She notes this has worsened as she has gained weight and she notes this causes her chest pain at times. Advised to use cold brew coffee and attempt weight loss. Use Famotidine 20-40 mg qhs.       Relevant Orders   CBC   TSH      I have discontinued Pho[place]ia Ybarra. [place]'s TURMERIC PO. I am also having her start on glimepiride. Additionally, I am having her maintain her lamoTRIgine, naproxen, Calcium, omega-3 acid ethyl esters, cetirizine, ONE TOUCH LANCETS, Vitamin D, Evening Primrose Oil, Ferrous Fumarate, and OneTouch Verio.  Meds ordered this encounter  Medications  . glimepiride (AMARYL) 1 MG tablet    Sig: Take 1 tablet (1 mg total) by mouth daily with breakfast.    Dispense:  30 tablet    Refill:  3     I discussed the assessment and treatment plan with the patient. The  patient was provided an opportunity to ask questions and all were answered. The patient agreed with the plan and  demonstrated an understanding of the instructions.   The patient was advised to call back or seek an in-person evaluation if the symptoms worsen or if the condition fails to improve as anticipated.  I provided 25 minutes of non-face-to-face time during this encounter.   Penni Homans, MD

## 2019-11-27 NOTE — Assessment & Plan Note (Signed)
Encouraged DASH or MIND diet, decrease po intake and increase exercise as tolerated. Needs 7-8 hours of sleep nightly. Avoid trans fats, eat small, frequent meals every 4-5 hours with lean proteins, complex carbs and healthy fats. Minimize simple carbs, consider [employer] APP

## 2019-11-27 NOTE — Assessment & Plan Note (Signed)
Sugar have trended up over the past few months. Often seeing numbers between 170 and 200 in the morning and endorsing some polydipsia. Does not tolerate Metformin so we will add Glimeperide 1 mg daily and recheck hgba1c. minimize simple carbs. Increase exercise as tolerated.

## 2019-12-06 DIAGNOSIS — D2271 Melanocytic nevi of right lower limb, including hip: Secondary | ICD-10-CM | POA: Diagnosis not present

## 2019-12-06 DIAGNOSIS — D2272 Melanocytic nevi of left lower limb, including hip: Secondary | ICD-10-CM | POA: Diagnosis not present

## 2019-12-06 DIAGNOSIS — L821 Other seborrheic keratosis: Secondary | ICD-10-CM | POA: Diagnosis not present

## 2019-12-06 DIAGNOSIS — D1801 Hemangioma of skin and subcutaneous tissue: Secondary | ICD-10-CM | POA: Diagnosis not present

## 2019-12-06 DIAGNOSIS — D2372 Other benign neoplasm of skin of left lower limb, including hip: Secondary | ICD-10-CM | POA: Diagnosis not present

## 2019-12-06 DIAGNOSIS — D225 Melanocytic nevi of trunk: Secondary | ICD-10-CM | POA: Diagnosis not present

## 2019-12-06 DIAGNOSIS — D2261 Melanocytic nevi of right upper limb, including shoulder: Secondary | ICD-10-CM | POA: Diagnosis not present

## 2019-12-06 DIAGNOSIS — L814 Other melanin hyperpigmentation: Secondary | ICD-10-CM | POA: Diagnosis not present

## 2019-12-06 DIAGNOSIS — Z85828 Personal history of other malignant neoplasm of skin: Secondary | ICD-10-CM | POA: Diagnosis not present

## 2019-12-06 DIAGNOSIS — L570 Actinic keratosis: Secondary | ICD-10-CM | POA: Diagnosis not present

## 2019-12-07 ENCOUNTER — Other Ambulatory Visit: Payer: Self-pay

## 2019-12-07 ENCOUNTER — Other Ambulatory Visit (INDEPENDENT_AMBULATORY_CARE_PROVIDER_SITE_OTHER): Payer: Medicare HMO

## 2019-12-07 ENCOUNTER — Encounter: Payer: Self-pay | Admitting: Family Medicine

## 2019-12-07 DIAGNOSIS — E785 Hyperlipidemia, unspecified: Secondary | ICD-10-CM | POA: Diagnosis not present

## 2019-12-07 DIAGNOSIS — E11319 Type 2 diabetes mellitus with unspecified diabetic retinopathy without macular edema: Secondary | ICD-10-CM | POA: Diagnosis not present

## 2019-12-07 DIAGNOSIS — R0789 Other chest pain: Secondary | ICD-10-CM | POA: Diagnosis not present

## 2019-12-07 DIAGNOSIS — K219 Gastro-esophageal reflux disease without esophagitis: Secondary | ICD-10-CM

## 2019-12-07 LAB — COMPREHENSIVE METABOLIC PANEL
ALT: 20 U/L (ref 0–35)
AST: 21 U/L (ref 0–37)
Albumin: 4.5 g/dL (ref 3.5–5.2)
Alkaline Phosphatase: 59 U/L (ref 39–117)
BUN: 17 mg/dL (ref 6–23)
CO2: 29 mEq/L (ref 19–32)
Calcium: 9.4 mg/dL (ref 8.4–10.5)
Chloride: 104 mEq/L (ref 96–112)
Creatinine, Ser: 1.1 mg/dL (ref 0.40–1.20)
GFR: 49.06 mL/min — ABNORMAL LOW (ref >60.00)
Glucose, Bld: 114 mg/dL — ABNORMAL HIGH (ref 70–99)
Potassium: 4.6 mEq/L (ref 3.5–5.1)
Sodium: 141 mEq/L (ref 135–145)
Total Bilirubin: 0.9 mg/dL (ref 0.2–1.2)
Total Protein: 6.7 g/dL (ref 6.0–8.3)

## 2019-12-07 LAB — CBC
HCT: 38.3 % (ref 36.0–46.0)
Hemoglobin: 12.7 g/dL (ref 12.0–15.0)
MCHC: 33.2 g/dL (ref 30.0–36.0)
MCV: 89.4 fl (ref 78.0–100.0)
Platelets: 259 10*3/uL (ref 150.0–400.0)
RBC: 4.28 Mil/uL (ref 3.87–5.11)
RDW: 13.7 % (ref 11.5–15.5)
WBC: 7.6 10*3/uL (ref 4.0–10.5)

## 2019-12-07 LAB — LIPID PANEL
Cholesterol: 236 mg/dL — ABNORMAL HIGH (ref 0–200)
HDL: 65.9 mg/dL (ref >39.00)
LDL Cholesterol: 147 mg/dL — ABNORMAL HIGH (ref 0–99)
NonHDL: 170.27
Total CHOL/HDL Ratio: 4
Triglycerides: 116 mg/dL (ref 0.0–149.0)
VLDL: 23.2 mg/dL (ref 0.0–40.0)

## 2019-12-07 LAB — TSH: TSH: 2.3 u[IU]/mL (ref 0.35–4.50)

## 2019-12-07 LAB — HEMOGLOBIN A1C: Hgb A1c MFr Bld: 8 % — ABNORMAL HIGH (ref 4.6–6.5)

## 2019-12-11 MED ORDER — GLIMEPIRIDE 1 MG PO TABS: 1.0000 mg | ORAL_TABLET | Freq: Two times a day (BID) | ORAL | 3 refills | Status: DC

## 2019-12-11 MED ORDER — ROSUVASTATIN CALCIUM 5 MG PO TABS: 5.0000 mg | ORAL_TABLET | ORAL | 3 refills | Status: DC

## 2020-01-01 DIAGNOSIS — M720 Palmar fascial fibromatosis [Dupuytren]: Secondary | ICD-10-CM | POA: Diagnosis not present

## 2020-01-01 DIAGNOSIS — M65342 Trigger finger, left ring finger: Secondary | ICD-10-CM | POA: Diagnosis not present

## 2020-01-01 DIAGNOSIS — M65321 Trigger finger, right index finger: Secondary | ICD-10-CM | POA: Diagnosis not present

## 2020-01-08 DIAGNOSIS — R69 Illness, unspecified: Secondary | ICD-10-CM | POA: Diagnosis not present

## 2020-01-17 ENCOUNTER — Encounter: Payer: Self-pay | Admitting: Family Medicine

## 2020-01-17 DIAGNOSIS — R69 Illness, unspecified: Secondary | ICD-10-CM | POA: Diagnosis not present

## 2020-01-17 MED ORDER — ONETOUCH DELICA LANCETS 33G MISC: 1 refills | Status: DC

## 2020-01-17 MED ORDER — ONETOUCH VERIO VI STRP: ORAL_STRIP | 1 refills | Status: DC

## 2020-01-17 NOTE — Telephone Encounter (Signed)
I have already sent in her test strips and lancets.

## 2020-01-19 ENCOUNTER — Encounter (INDEPENDENT_AMBULATORY_CARE_PROVIDER_SITE_OTHER): Payer: Medicare HMO | Admitting: Ophthalmology

## 2020-02-06 ENCOUNTER — Encounter (INDEPENDENT_AMBULATORY_CARE_PROVIDER_SITE_OTHER): Payer: Medicare HMO | Admitting: Ophthalmology

## 2020-02-19 ENCOUNTER — Other Ambulatory Visit: Payer: Self-pay | Admitting: Family Medicine

## 2020-03-02 ENCOUNTER — Other Ambulatory Visit: Payer: Self-pay | Admitting: Family Medicine

## 2020-03-06 DIAGNOSIS — M79641 Pain in right hand: Secondary | ICD-10-CM | POA: Diagnosis not present

## 2020-03-06 DIAGNOSIS — M65321 Trigger finger, right index finger: Secondary | ICD-10-CM | POA: Diagnosis not present

## 2020-03-06 DIAGNOSIS — M65342 Trigger finger, left ring finger: Secondary | ICD-10-CM | POA: Diagnosis not present

## 2020-03-07 ENCOUNTER — Other Ambulatory Visit: Payer: Self-pay

## 2020-03-07 ENCOUNTER — Other Ambulatory Visit: Payer: Self-pay | Admitting: *Deleted

## 2020-03-07 ENCOUNTER — Other Ambulatory Visit (INDEPENDENT_AMBULATORY_CARE_PROVIDER_SITE_OTHER): Payer: Medicare HMO

## 2020-03-07 DIAGNOSIS — E785 Hyperlipidemia, unspecified: Secondary | ICD-10-CM

## 2020-03-07 DIAGNOSIS — H35371 Puckering of macula, right eye: Secondary | ICD-10-CM | POA: Diagnosis not present

## 2020-03-07 DIAGNOSIS — Z0100 Encounter for examination of eyes and vision without abnormal findings: Secondary | ICD-10-CM | POA: Diagnosis not present

## 2020-03-07 DIAGNOSIS — E11319 Type 2 diabetes mellitus with unspecified diabetic retinopathy without macular edema: Secondary | ICD-10-CM | POA: Diagnosis not present

## 2020-03-07 DIAGNOSIS — E119 Type 2 diabetes mellitus without complications: Secondary | ICD-10-CM | POA: Diagnosis not present

## 2020-03-07 LAB — HM DIABETES EYE EXAM

## 2020-03-07 LAB — LIPID PANEL
Cholesterol: 223 mg/dL — ABNORMAL HIGH (ref 0–200)
HDL: 79.2 mg/dL (ref >39.00)
LDL Cholesterol: 124 mg/dL — ABNORMAL HIGH (ref 0–99)
NonHDL: 144.09
Total CHOL/HDL Ratio: 3
Triglycerides: 102 mg/dL (ref 0.0–149.0)
VLDL: 20.4 mg/dL (ref 0.0–40.0)

## 2020-03-07 LAB — HEMOGLOBIN A1C: Hgb A1c MFr Bld: 6.7 % — ABNORMAL HIGH (ref 4.6–6.5)

## 2020-03-08 ENCOUNTER — Encounter: Payer: Self-pay | Admitting: *Deleted

## 2020-03-14 ENCOUNTER — Encounter: Payer: Self-pay | Admitting: Family Medicine

## 2020-03-14 ENCOUNTER — Other Ambulatory Visit: Payer: Self-pay

## 2020-03-14 ENCOUNTER — Ambulatory Visit (INDEPENDENT_AMBULATORY_CARE_PROVIDER_SITE_OTHER): Payer: Medicare HMO | Admitting: Family Medicine

## 2020-03-14 VITALS — BP 110/60 | HR 67 | Temp 98.0°F | Resp 12 | Ht 64.0 in | Wt 171.2 lb

## 2020-03-14 DIAGNOSIS — K219 Gastro-esophageal reflux disease without esophagitis: Secondary | ICD-10-CM

## 2020-03-14 DIAGNOSIS — M79641 Pain in right hand: Secondary | ICD-10-CM | POA: Insufficient documentation

## 2020-03-14 DIAGNOSIS — M65342 Trigger finger, left ring finger: Secondary | ICD-10-CM

## 2020-03-14 DIAGNOSIS — M79674 Pain in right toe(s): Secondary | ICD-10-CM

## 2020-03-14 DIAGNOSIS — M8580 Other specified disorders of bone density and structure, unspecified site: Secondary | ICD-10-CM | POA: Diagnosis not present

## 2020-03-14 DIAGNOSIS — E785 Hyperlipidemia, unspecified: Secondary | ICD-10-CM

## 2020-03-14 DIAGNOSIS — R69 Illness, unspecified: Secondary | ICD-10-CM | POA: Diagnosis not present

## 2020-03-14 DIAGNOSIS — M79642 Pain in left hand: Secondary | ICD-10-CM

## 2020-03-14 DIAGNOSIS — M65341 Trigger finger, right ring finger: Secondary | ICD-10-CM | POA: Diagnosis not present

## 2020-03-14 DIAGNOSIS — E11319 Type 2 diabetes mellitus with unspecified diabetic retinopathy without macular edema: Secondary | ICD-10-CM

## 2020-03-14 DIAGNOSIS — F418 Other specified anxiety disorders: Secondary | ICD-10-CM

## 2020-03-14 LAB — SEDIMENTATION RATE: Sed Rate: 2 mm/hr (ref 0–30)

## 2020-03-14 LAB — COMPREHENSIVE METABOLIC PANEL
ALT: 24 U/L (ref 0–35)
AST: 21 U/L (ref 0–37)
Albumin: 4.5 g/dL (ref 3.5–5.2)
Alkaline Phosphatase: 40 U/L (ref 39–117)
BUN: 21 mg/dL (ref 6–23)
CO2: 29 mEq/L (ref 19–32)
Calcium: 9.5 mg/dL (ref 8.4–10.5)
Chloride: 103 mEq/L (ref 96–112)
Creatinine, Ser: 1.1 mg/dL (ref 0.40–1.20)
GFR: 49.02 mL/min — ABNORMAL LOW (ref >60.00)
Glucose, Bld: 120 mg/dL — ABNORMAL HIGH (ref 70–99)
Potassium: 4.4 mEq/L (ref 3.5–5.1)
Sodium: 139 mEq/L (ref 135–145)
Total Bilirubin: 0.6 mg/dL (ref 0.2–1.2)
Total Protein: 6.7 g/dL (ref 6.0–8.3)

## 2020-03-14 LAB — CBC WITH DIFFERENTIAL/PLATELET
Basophils Absolute: 0 10*3/uL (ref 0.0–0.1)
Basophils Relative: 0.5 % (ref 0.0–3.0)
Eosinophils Absolute: 0.5 10*3/uL (ref 0.0–0.7)
Eosinophils Relative: 7.5 % — ABNORMAL HIGH (ref 0.0–5.0)
HCT: 36.6 % (ref 36.0–46.0)
Hemoglobin: 12.1 g/dL (ref 12.0–15.0)
Lymphocytes Relative: 48.7 % — ABNORMAL HIGH (ref 12.0–46.0)
Lymphs Abs: 2.9 10*3/uL (ref 0.7–4.0)
MCHC: 33 g/dL (ref 30.0–36.0)
MCV: 89.7 fl (ref 78.0–100.0)
Monocytes Absolute: 0.6 10*3/uL (ref 0.1–1.0)
Monocytes Relative: 9.4 % (ref 3.0–12.0)
Neutro Abs: 2 10*3/uL (ref 1.4–7.7)
Neutrophils Relative %: 33.9 % — ABNORMAL LOW (ref 43.0–77.0)
Platelets: 236 10*3/uL (ref 150.0–400.0)
RBC: 4.08 Mil/uL (ref 3.87–5.11)
RDW: 14 % (ref 11.5–15.5)
WBC: 6 10*3/uL (ref 4.0–10.5)

## 2020-03-14 LAB — URIC ACID: Uric Acid, Serum: 5.2 mg/dL (ref 2.4–7.0)

## 2020-03-14 NOTE — Assessment & Plan Note (Signed)
hgba1c acceptable, minimize simple carbs. Increase exercise as tolerated. Continue current meds 

## 2020-03-14 NOTE — Patient Instructions (Signed)
Gout  Gout is a condition that causes painful swelling of the joints. Gout is a type of inflammation of the joints (arthritis). This condition is caused by having too much uric acid in the body. Uric acid is a chemical that forms when the body breaks down substances called purines. Purines are important for building body proteins. When the body has too much uric acid, sharp crystals can form and build up inside the joints. This causes pain and swelling. Gout attacks can happen quickly and may be very painful (acute gout). Over time, the attacks can affect more joints and become more frequent (chronic gout). Gout can also cause uric acid to build up under the skin and inside the kidneys. What are the causes? This condition is caused by too much uric acid in your blood. This can happen because:  Your kidneys do not remove enough uric acid from your blood. This is the most common cause.  Your body makes too much uric acid. This can happen with some cancers and cancer treatments. It can also occur if your body is breaking down too many red blood cells (hemolytic anemia).  You eat too many foods that are high in purines. These foods include organ meats and some seafood. Alcohol, especially beer, is also high in purines. A gout attack may be triggered by trauma or stress. What increases the risk? You are more likely to develop this condition if you:  Have a family history of gout.  Are female and middle-aged.  Are female and have gone through menopause.  Are obese.  Frequently drink alcohol, especially beer.  Are dehydrated.  Lose weight too quickly.  Have an organ transplant.  Have lead poisoning.  Take certain medicines, including aspirin, cyclosporine, diuretics, levodopa, and niacin.  Have kidney disease.  Have a skin condition called psoriasis. What are the signs or symptoms? An attack of acute gout happens quickly. It usually occurs in just one joint. The most common place is  the big toe. Attacks often start at night. Other joints that may be affected include joints of the feet, ankle, knee, fingers, wrist, or elbow. Symptoms of this condition may include:  Severe pain.  Warmth.  Swelling.  Stiffness.  Tenderness. The affected joint may be very painful to touch.  Shiny, red, or purple skin.  Chills and fever. Chronic gout may cause symptoms more frequently. More joints may be involved. You may also have white or yellow lumps (tophi) on your hands or feet or in other areas near your joints. How is this diagnosed? This condition is diagnosed based on your symptoms, medical history, and physical exam. You may have tests, such as:  Blood tests to measure uric acid levels.  Removal of joint fluid with a thin needle (aspiration) to look for uric acid crystals.  X-rays to look for joint damage. How is this treated? Treatment for this condition has two phases: treating an acute attack and preventing future attacks. Acute gout treatment may include medicines to reduce pain and swelling, including:  NSAIDs.  Steroids. These are strong anti-inflammatory medicines that can be taken by mouth (orally) or injected into a joint.  Colchicine. This medicine relieves pain and swelling when it is taken soon after an attack. It can be given by mouth or through an IV. Preventive treatment may include:  Daily use of smaller doses of NSAIDs or colchicine.  Use of a medicine that reduces uric acid levels in your blood.  Changes to your diet. You may   need to see a dietitian about what to eat and drink to prevent gout. Follow these instructions at home: During a gout attack   If directed, put ice on the affected area: ? Put ice in a plastic bag. ? Place a towel between your skin and the bag. ? Leave the ice on for 20 minutes, 2-3 times a day.  Raise (elevate) the affected joint above the level of your heart as often as possible.  Rest the joint as much as possible.  If the affected joint is in your leg, you may be given crutches to use.  Follow instructions from your health care provider about eating or drinking restrictions. Avoiding future gout attacks  Follow a low-purine diet as told by your dietitian or health care provider. Avoid foods and drinks that are high in purines, including liver, kidney, anchovies, asparagus, herring, mushrooms, mussels, and beer.  Maintain a healthy weight or lose weight if you are overweight. If you want to lose weight, talk with your health care provider. It is important that you do not lose weight too quickly.  Start or maintain an exercise program as told by your health care provider. Eating and drinking  Drink enough fluids to keep your urine pale yellow.  If you drink alcohol: ? Limit how much you use to:  0-1 drink a day for women.  0-2 drinks a day for men. ? Be aware of how much alcohol is in your drink. In the [place] one drink equals one 12 oz bottle of beer (355 mL) one 5 oz glass of wine (148 mL), or one 1 oz glass of hard liquor (44 mL). General instructions  Take over-the-counter and prescription medicines only as told by your health care provider.  Do not drive or use heavy machinery while taking prescription pain medicine.  Return to your normal activities as told by your health care provider. Ask your health care provider what activities are safe for you.  Keep all follow-up visits as told by your health care provider. This is important. Contact a health care provider if you have:  Another gout attack.  Continuing symptoms of a gout attack after 10 days of treatment.  Side effects from your medicines.  Chills or a fever.  Burning pain when you urinate.  Pain in your lower back or belly. Get help right away if you:  Have severe or uncontrolled pain.  Cannot urinate. Summary  Gout is painful swelling of the joints caused by inflammation.  The most common site of pain is the big  toe, but it can affect other joints in the body.  Medicines and dietary changes can help to prevent and treat gout attacks. This information is not intended to replace advice given to you by your health care provider. Make sure you discuss any questions you have with your health care provider. Document Revised: 03/16/2018 Document Reviewed: 03/16/2018 Elsevier Patient Education  2020 [employer]  

## 2020-03-14 NOTE — Assessment & Plan Note (Signed)
Encouraged to get adequate exercise, calcium and vitamin d intake 

## 2020-03-14 NOTE — Progress Notes (Signed)
Subjective:    Patient ID: Destiny Romero, female    DOB: 04-06-1951, 69 y.o.   MRN: [id number]  Chief Complaint  Patient presents with  . Diabetes    HPI Patient is in today for follow up on chronic medical concerns. She is still struggling with anhedonia and depression but does not endorse suicidal ideation. She notes work has been very stressful. No recent febrile illness or hospitalizations. She is noting increased hand and arm pain but denies any recent injury or fall. No redness warmth or swelling. Denies CP/palp/SOB/HA/congestion/fevers/GI or GU c/o. Taking meds as prescribed  Past Medical History:  Diagnosis Date  . Allergy   . Anal lesion 2007  . Anemia   . Anesthesia complication    Per pt/ sensitive to sedation!  . Central retinal artery occlusion 06/08/2016   limited vision right eye.  Marland Kitchen Cervical dysplasia 04/1991   Dr Rica Koyanagi - CIN I, cone and freeze in 1990s with normal paps since  . Constipation 06/08/2016   moves bowels every 3 rd day   . Decreased visual acuity 06/08/2016   Retinal bleed right eye 2017  . Depression   . Fatigue 06/27/2017  . H/O measles   . H/O mumps   . History of chicken pox   . Mitral valve prolapse    NO MR; no SBE prophylaxis needed  . PONV (postoperative nausea and vomiting)   . Pre-diabetes    highest A1c 6.4 %  . Preventative health care 12/21/2016  . Squamous cell carcinoma in situ of skin    Dr [place]  . Vision impairment    limited vision in right eye.    Past Surgical History:  Procedure Laterality Date  . CERVICAL CONE BIOPSY    . COLONOSCOPY  2003  . COLONOSCOPY  04/01/2011   [place] - repeat in 5 years  . COLONOSCOPY W/ POLYPECTOMY  2007   Dr Earlean Shawl  . EYE SURGERY Bilateral    cataracts  . LAMINECTOMY     1982  . OOPHORECTOMY Left 12/2011   Dr Ubaldo Glassing ; ovarian cyst  . RECTAL SURGERY  05/28/06   pre-canerous lesion removed in 2007; Dr Morton Stall, [facility]  . rt foot morton's surgery    . TUBAL LIGATION      Family  History  Problem Relation Age of Onset  . Diabetes Mother   . Dementia Mother   . Prostate cancer Father   . COPD Father   . Heart disease Father        CAD - used nitroglycerin tablets  . Diabetes Sister        obese  . Thyroid cancer Sister   . Diabetes Brother        not obese  . Lymphoma Brother        NHL  . Cancer Brother        non Hodgkin's Lymphoma  . Diabetes Maternal Grandmother        IDDM  . Diabetes Paternal Grandmother        IDDM  . Heart disease Paternal Grandmother   . Diabetes Maternal Aunt        X5 ; all IDDM  . Heart disease Maternal Aunt        "enlarged heart"  . Heart disease Maternal Uncle        "enlarged heart"  . Heart disease Paternal Grandfather        CAD - used nitroglycerin tablets  . Diabetes Brother   .  Colon cancer Neg Hx   . Esophageal cancer Neg Hx   . Stomach cancer Neg Hx   . Stroke Neg Hx     Social History   Socioeconomic History  . Marital status: Single    Spouse name: Not on file  . Number of children: Not on file  . Years of education: Not on file  . Highest education level: Not on file  Occupational History  . Not on file  Tobacco Use  . Smoking status: Never Smoker  . Smokeless tobacco: Never Used  Vaping Use  . Vaping Use: Never used  Substance and Sexual Activity  . Alcohol use: Yes    Comment: glass wine once a month  . Drug use: No  . Sexual activity: Never    Partners: Male    Birth control/protection: Abstinence  Other Topics Concern  . Not on file  Social History Narrative   Lives alone   No dietary restrictions   Social Determinants of Health   Financial Resource Strain:   . Difficulty of Paying Living Expenses:   Food Insecurity:   . Worried About Charity fundraiser in the Last Year:   . Arboriculturist in the Last Year:   Transportation Needs:   . Film/video editor (Medical):   Marland Kitchen Lack of Transportation (Non-Medical):   Physical Activity:   . Days of Exercise per Week:   .  Minutes of Exercise per Session:   Stress:   . Feeling of Stress :   Social Connections:   . Frequency of Communication with Friends and Family:   . Frequency of Social Gatherings with Friends and Family:   . Attends Religious Services:   . Active Member of Clubs or Organizations:   . Attends Archivist Meetings:   Marland Kitchen Marital Status:   Intimate Partner Violence:   . Fear of Current or Ex-Partner:   . Emotionally Abused:   Marland Kitchen Physically Abused:   . Sexually Abused:     Outpatient Medications Prior to Visit  Medication Sig Dispense Refill  . Calcium 200 MG TABS Take by mouth. Tale [phone] mg daily    . cetirizine (ZYRTEC) 10 MG tablet Take 10 mg by mouth daily.    . Cholecalciferol (VITAMIN D) 2000 units CAPS     . Evening Primrose Oil 1000 MG CAPS     . Ferrous Fumarate (FERROCITE) 324 (106 Fe) MG TABS tablet Take 1 tablet (106 mg of iron total) by mouth daily. 90 tablet 1  . glimepiride (AMARYL) 1 MG tablet TAKE 1 TABLET (1 MG TOTAL) BY MOUTH DAILY WITH BREAKFAST. 90 tablet 1  . glucose blood (ONETOUCH VERIO) test strip USE TO TEST BLOOD SUGAR 3X DAILY.  Dx code: E11.65 300 strip 1  . lamoTRIgine (LAMICTAL) 200 MG tablet Take 200 mg by mouth daily.    . meloxicam (MOBIC) 7.5 MG tablet Take 7.5 mg by mouth daily.    . naproxen (NAPROSYN) 500 MG tablet Take 500 mg by mouth as needed.    Marland Kitchen omega-3 acid ethyl esters (LOVAZA) 1 g capsule Take by mouth daily.    [name] Delica Lancets 96V MISC Use to test blood sugar 3X daily.  Dx Code: E11.65 300 each 1  . rosuvastatin (CRESTOR) 5 MG tablet TAKE 1 TABLET (5 MG TOTAL) BY MOUTH 2 (TWO) TIMES A WEEK. 24 tablet 1   No facility-administered medications prior to visit.    Allergies  Allergen Reactions  . Cortisone  Palpitations, Other (See Comments) and Nausea And Vomiting    hyperactivity hyperactivity  . Pseudoephedrine Other (See Comments)    tachycardia tachycardia  . Atorvastatin Other (See Comments)    Low dose  .  Codeine   . Fluoxetine Hcl Other (See Comments)  . Metformin Other (See Comments)    04/15/15 caused dizziness  . Metformin And Related     04/15/15 caused dizziness  . Shellfish Allergy Other (See Comments)    [other]    Review of Systems  Constitutional: Negative for fever and malaise/fatigue.  HENT: Negative for congestion.   Eyes: Negative for blurred vision.  Respiratory: Negative for shortness of breath.   Cardiovascular: Negative for chest pain, palpitations and leg swelling.  Gastrointestinal: Negative for abdominal pain, blood in stool and nausea.  Genitourinary: Negative for dysuria and frequency.  Musculoskeletal: Positive for joint pain and myalgias. Negative for falls.  Skin: Negative for rash.  Neurological: Negative for dizziness, loss of consciousness and headaches.  Endo/Heme/Allergies: Negative for environmental allergies.  Psychiatric/Behavioral: Negative for depression. The patient is not nervous/anxious.        Objective:    Physical Exam Vitals and nursing note reviewed.  Constitutional:      General: She is not in acute distress.    Appearance: She is well-developed.  HENT:     Head: Normocephalic and atraumatic.     Nose: Nose normal.  Eyes:     General:        Right eye: No discharge.        Left eye: No discharge.  Cardiovascular:     Rate and Rhythm: Normal rate and regular rhythm.     Heart sounds: No murmur heard.   Pulmonary:     Effort: Pulmonary effort is normal.     Breath sounds: Normal breath sounds.  Abdominal:     General: Bowel sounds are normal.     Palpations: Abdomen is soft.     Tenderness: There is no abdominal tenderness.  Musculoskeletal:     Cervical back: Normal range of motion and neck supple.  Skin:    General: Skin is warm and dry.  Neurological:     Mental Status: She is alert and oriented to person, place, and time.     BP 110/60 (BP Location: Left Arm, Cuff Size: Large)   Pulse 67   Temp 98 F (36.7 C)  (Temporal)   Resp 12   Ht 5\' 4"  (1.626 m)   Wt 171 lb 3.2 oz (77.7 kg)   LMP 04/07/2001 (Approximate)   SpO2 97%   BMI 29.39 kg/m  Wt Readings from Last 3 Encounters:  03/14/20 171 lb 3.2 oz (77.7 kg)  07/13/19 165 lb (74.8 kg)  05/11/19 161 lb 3.2 oz (73.1 kg)    Diabetic Foot Exam - Simple   No data filed     Lab Results  Component Value Date   WBC 6.0 03/14/2020   HGB 12.1 03/14/2020   HCT 36.6 03/14/2020   PLT 236.0 03/14/2020   GLUCOSE 120 (H) 03/14/2020   CHOL 223 (H) 03/07/2020   TRIG 102.0 03/07/2020   HDL 79.20 03/07/2020   LDLDIRECT 146.5 08/11/2013   LDLCALC 124 (H) 03/07/2020   ALT 24 03/14/2020   AST 21 03/14/2020   NA 139 03/14/2020   K 4.4 03/14/2020   CL 103 03/14/2020   CREATININE 1.10 03/14/2020   BUN 21 03/14/2020   CO2 29 03/14/2020   TSH 2.30 12/07/2019   HGBA1C  6.7 (H) 03/07/2020   MICROALBUR <0.7 07/11/2015    Lab Results  Component Value Date   TSH 2.30 12/07/2019   Lab Results  Component Value Date   WBC 6.0 03/14/2020   HGB 12.1 03/14/2020   HCT 36.6 03/14/2020   MCV 89.7 03/14/2020   PLT 236.0 03/14/2020   Lab Results  Component Value Date   NA 139 03/14/2020   K 4.4 03/14/2020   CO2 29 03/14/2020   GLUCOSE 120 (H) 03/14/2020   BUN 21 03/14/2020   CREATININE 1.10 03/14/2020   BILITOT 0.6 03/14/2020   ALKPHOS 40 03/14/2020   AST 21 03/14/2020   ALT 24 03/14/2020   PROT 6.7 03/14/2020   ALBUMIN 4.5 03/14/2020   CALCIUM 9.5 03/14/2020   GFR 49.02 (L) 03/14/2020   Lab Results  Component Value Date   CHOL 223 (H) 03/07/2020   Lab Results  Component Value Date   HDL 79.20 03/07/2020   Lab Results  Component Value Date   LDLCALC 124 (H) 03/07/2020   Lab Results  Component Value Date   TRIG 102.0 03/07/2020   Lab Results  Component Value Date   CHOLHDL 3 03/07/2020   Lab Results  Component Value Date   HGBA1C 6.7 (H) 03/07/2020       Assessment & Plan:   Problem List Items Addressed This Visit     Controlled type 2 diabetes with retinopathy ([place]) (Chronic)    hgba1c acceptable, minimize simple carbs. Increase exercise as tolerated. Continue current meds      Hyperlipidemia    Encouraged heart healthy diet, increase exercise, avoid trans fats, consider a krill oil cap daily      Depression with anxiety    Managing but has some bad days. Not always getting up some days. Follows with psychiatry. No changes to medications. Work has been very stressful.       Osteopenia    Encouraged to get adequate exercise, calcium and vitamin d intake      Acquired trigger finger of both ring fingers    Works with Dr Fredna Dow, has had steroid shots in right 2nd finger but it spiked her sugar and was very painful so she does not want another. Continue Meloxicam and check labs      Acid reflux    Avoid offending foods, start probiotics. Do not eat large meals in late evening and consider raising head of bed.       Bilateral hand pain    With paresthesias up arms at times and associated shoulder and neck stiffness and  Pain. Will refer to [facility] for further evaluation      Relevant Orders   Sedimentation rate (Completed)   Rheumatoid Factor (Completed)   Antinuclear Antib (ANA) (Completed)   Ambulatory referral to Orthopedic Surgery   Toe pain, right - Primary    Check labs and hydrate well      Relevant Orders   Uric acid (Completed)   Comprehensive metabolic panel (Completed)   CBC w/Diff (Completed)      I have discontinued Jonni Sanger. Vivero's rosuvastatin. I am also having her maintain her lamoTRIgine, naproxen, Calcium, omega-3 acid ethyl esters, cetirizine, Vitamin D, Evening Primrose Oil, Ferrous Fumarate, OneTouch Verio, OneTouch Delica Lancets 29J, glimepiride, and meloxicam.  No orders of the defined types were placed in this encounter.    Penni Homans, MD

## 2020-03-14 NOTE — Assessment & Plan Note (Addendum)
Managing but has some bad days. Not always getting up some days. Follows with psychiatry. No changes to medications. Work has been very stressful.

## 2020-03-14 NOTE — Assessment & Plan Note (Addendum)
Works with Dr Fredna Dow, has had steroid shots in right 2nd finger but it spiked her sugar and was very painful so she does not want another. Continue Meloxicam and check labs

## 2020-03-14 NOTE — Assessment & Plan Note (Signed)
With paresthesias up arms at times and associated shoulder and neck stiffness and  Pain. Will refer to [facility] for further evaluation

## 2020-03-15 ENCOUNTER — Other Ambulatory Visit: Payer: Self-pay | Admitting: *Deleted

## 2020-03-15 DIAGNOSIS — R7989 Other specified abnormal findings of blood chemistry: Secondary | ICD-10-CM

## 2020-03-15 LAB — RHEUMATOID FACTOR: Rheumatoid fact SerPl-aCnc: <14 IU/mL (ref <14)

## 2020-03-15 LAB — ANA: Anti Nuclear Antibody (ANA): NEGATIVE

## 2020-03-18 ENCOUNTER — Encounter: Payer: Self-pay | Admitting: Family Medicine

## 2020-03-18 NOTE — Assessment & Plan Note (Signed)
Check labs and hydrate well 

## 2020-03-18 NOTE — Assessment & Plan Note (Signed)
Avoid offending foods, start probiotics. Do not eat large meals in late evening and consider raising head of bed.  

## 2020-03-18 NOTE — Assessment & Plan Note (Signed)
Encouraged heart healthy diet, increase exercise, avoid trans fats, consider a krill oil cap daily 

## 2020-03-20 NOTE — Progress Notes (Signed)
I connected with Destiny Romero today by telephone and verified that I am speaking with the correct person using two identifiers. Location patient: home Location provider: work Persons participating in the virtual visit: patient, Marine scientist.    I discussed the limitations, risks, security and privacy concerns of performing an evaluation and management service by telephone and the availability of in person appointments. I also discussed with the patient that there may be a patient responsible charge related to this service. The patient expressed understanding and verbally consented to this telephonic visit.    Interactive audio and video telecommunications were attempted between this provider and patient, however failed, due to patient having technical difficulties OR patient did not have access to video capability.  We continued and completed visit with audio only.  Some vital signs may be absent or patient reported.   Subjective:   Destiny Romero is a 69 y.o. female who presents for Medicare Annual (Subsequent) preventive examination.  Review of Systems    Cardiac Risk Factors include: advanced age (>27men, >41 women);dyslipidemia;diabetes mellitus     Objective:     Advanced Directives 03/21/2020 03/21/2019 03/15/2018 12/21/2016 09/15/2016 09/06/2014  Does Patient Have a Medical Advance Directive? Yes Yes Yes Yes Yes Yes  Type of Paramedic of [place];Living will [place];Living will [place];Living will [place];Living will - Living will;Out of facility DNR (pink MOST or yellow form)  Does patient want to make changes to medical advance directive? No - Patient declined No - Patient declined - - - -  Copy of [place] in Chart? No - copy requested No - copy requested No - copy requested No - copy requested - -    Current Medications (verified) Outpatient Encounter Medications as of 03/21/2020    Medication Sig  . Calcium 200 MG TABS Take by mouth. Tale [phone] mg daily  . cetirizine (ZYRTEC) 10 MG tablet Take 10 mg by mouth daily.  . Cholecalciferol (VITAMIN D) 2000 units CAPS   . Evening Primrose Oil 1000 MG CAPS   . Ferrous Fumarate (FERROCITE) 324 (106 Fe) MG TABS tablet Take 1 tablet (106 mg of iron total) by mouth daily.  Marland Kitchen glimepiride (AMARYL) 1 MG tablet TAKE 1 TABLET (1 MG TOTAL) BY MOUTH DAILY WITH BREAKFAST.  Marland Kitchen glucose blood (ONETOUCH VERIO) test strip USE TO TEST BLOOD SUGAR 3X DAILY.  Dx code: E11.65  . lamoTRIgine (LAMICTAL) 200 MG tablet Take 200 mg by mouth daily.  . meloxicam (MOBIC) 7.5 MG tablet Take 7.5 mg by mouth daily.  . naproxen (NAPROSYN) 500 MG tablet Take 500 mg by mouth as needed.  Marland Kitchen omega-3 acid ethyl esters (LOVAZA) 1 g capsule Take by mouth daily.  [name] Delica Lancets 81X MISC Use to test blood sugar 3X daily.  Dx Code: E11.65   No facility-administered encounter medications on file as of 03/21/2020.    Allergies (verified) Cortisone, Pseudoephedrine, Atorvastatin, Codeine, Fluoxetine hcl, Metformin, Metformin and related, and Shellfish allergy   History: Past Medical History:  Diagnosis Date  . Allergy   . Anal lesion 2007  . Anemia   . Anesthesia complication    Per pt/ sensitive to sedation!  . Central retinal artery occlusion 06/08/2016   limited vision right eye.  Marland Kitchen Cervical dysplasia 04/1991   Dr Rica Koyanagi - [facility] I, [facility] and freeze in 1990s with normal paps since  . Constipation 06/08/2016   moves bowels every 3 rd day   .  Decreased visual acuity 06/08/2016   Retinal bleed right eye 2017  . Depression   . Fatigue 06/27/2017  . H/O measles   . H/O mumps   . History of chicken pox   . Mitral valve prolapse    NO MR; no SBE prophylaxis needed  . PONV (postoperative nausea and vomiting)   . Pre-diabetes    highest A1c 6.4 %  . Preventative health care 12/21/2016  . Squamous cell carcinoma in situ of skin    Dr [place]  .  Vision impairment    limited vision in right eye.   Past Surgical History:  Procedure Laterality Date  . CERVICAL CONE BIOPSY    . COLONOSCOPY  2003  . COLONOSCOPY  04/01/2011   [place] - repeat in 5 years  . COLONOSCOPY W/ POLYPECTOMY  2007   Dr Earlean Shawl  . EYE SURGERY Bilateral    cataracts  . LAMINECTOMY     1982  . OOPHORECTOMY Left 12/2011   Dr Ubaldo Glassing ; ovarian cyst  . RECTAL SURGERY  05/28/06   pre-canerous lesion removed in 2007; Dr Morton Stall, [facility]  . rt foot morton's surgery    . TUBAL LIGATION     Family History  Problem Relation Age of Onset  . Diabetes Mother   . Dementia Mother   . Prostate cancer Father   . COPD Father   . Heart disease Father        CAD - used nitroglycerin tablets  . Diabetes Sister        obese  . Thyroid cancer Sister   . Diabetes Brother        not obese  . Lymphoma Brother        NHL  . Cancer Brother        non Hodgkin's Lymphoma  . Diabetes Maternal Grandmother        IDDM  . Diabetes Paternal Grandmother        IDDM  . Heart disease Paternal Grandmother   . Diabetes Maternal Aunt        X5 ; all IDDM  . Heart disease Maternal Aunt        "enlarged heart"  . Heart disease Maternal Uncle        "enlarged heart"  . Heart disease Paternal Grandfather        CAD - used nitroglycerin tablets  . Diabetes Brother   . Colon cancer Neg Hx   . Esophageal cancer Neg Hx   . Stomach cancer Neg Hx   . Stroke Neg Hx    Social History   Socioeconomic History  . Marital status: Single    Spouse name: Not on file  . Number of children: Not on file  . Years of education: Not on file  . Highest education level: Not on file  Occupational History  . Not on file  Tobacco Use  . Smoking status: Never Smoker  . Smokeless tobacco: Never Used  Vaping Use  . Vaping Use: Never used  Substance and Sexual Activity  . Alcohol use: Yes    Comment: glass wine once a month  . Drug use: No  . Sexual activity: Never    Partners: Male    Birth  control/protection: Abstinence  Other Topics Concern  . Not on file  Social History Narrative   Lives alone   No dietary restrictions   Social Determinants of Health   Financial Resource Strain: Low Risk   . Difficulty of Paying Living Expenses:  Not hard at all  Food Insecurity: No Food Insecurity  . Worried About Charity fundraiser in the Last Year: Never true  . Ran Out of Food in the Last Year: Never true  Transportation Needs: No Transportation Needs  . Lack of Transportation (Medical): No  . Lack of Transportation (Non-Medical): No  Physical Activity:   . Days of Exercise per Week:   . Minutes of Exercise per Session:   Stress:   . Feeling of Stress :   Social Connections:   . Frequency of Communication with Friends and Family:   . Frequency of Social Gatherings with Friends and Family:   . Attends Religious Services:   . Active Member of Clubs or Organizations:   . Attends Archivist Meetings:   Marland Kitchen Marital Status:     Tobacco Counseling Counseling given: Not Answered   Clinical Intake:     Pain : No/denies pain     Activities of Daily Living In your present state of health, do you have any difficulty performing the following activities: 03/21/2020  Hearing? N  Vision? N  Difficulty concentrating or making decisions? N  Walking or climbing stairs? N  Dressing or bathing? N  Doing errands, shopping? N  Preparing Food and eating ? N  Using the Toilet? N  In the past six months, have you accidently leaked urine? N  Do you have problems with loss of bowel control? N  Managing your Medications? N  Managing your Finances? N  Housekeeping or managing your Housekeeping? N  Some recent data might be hidden    Patient Care Team: Mosie Lukes, MD as PCP - General (Family Medicine) Philemon Kingdom, MD as Consulting Physician (Internal Medicine) Milus Banister, MD as Consulting Physician (Gastroenterology) Eliseo Gum, MD as Consulting  Physician (Psychiatry) Rolm Bookbinder, MD as Consulting Physician (Dermatology) Hayden Pedro, MD as Consulting Physician (Ophthalmology) Salvadore Dom, MD as Consulting Physician (Obstetrics and Gynecology) Darleen Crocker, MD as Consulting Physician (Ophthalmology)  Indicate any recent Medical Services you may have received from other than [facility] providers in the past year (date may be approximate).     Assessment:   This is a routine wellness examination for Destiny Romero.  Dietary issues and exercise activities discussed: Current Exercise Habits: The patient does not participate in regular exercise at present, Exercise limited by: None identified Diet (meal preparation, eat out, water intake, caffeinated beverages, dairy products, fruits and vegetables): well balanced   Goals    .  <enter goal here> (pt-stated)      Have less stress.    .  Weight (lb) < 145 lb (65.8 kg)      Depression Screen PHQ 2/9 Scores 03/21/2020 03/14/2020 03/21/2019 05/03/2018 03/15/2018 12/21/2016 09/13/2014  PHQ - 2 Score 2 4 2  0 0 0 0  PHQ- 9 Score 6 18 8  - - - -    Fall Risk Fall Risk  03/21/2020 03/14/2020 03/21/2019 05/03/2018 03/15/2018  Falls in the past year? 0 0 0 Yes Yes  Number falls in past yr: 0 - 0 1 2 or more  Injury with Fall? 0 - - No No  Risk for fall due to : - - - - History of fall(s)  Follow up Education provided;Falls prevention discussed - - - Education provided;Falls prevention discussed   Lives alone in 1 story home.  Any stairs in or around the home? No  If so, are there any without handrails? No  Home free of  loose throw rugs in walkways, pet beds, electrical cords, etc? Yes  Adequate lighting in your home to reduce risk of falls? Yes   Cognitive Function: Ad8 score reviewed for issues:  Issues making decisions:no  Less interest in hobbies / activities:no  Repeats questions, stories (family complaining):no  Trouble using ordinary gadgets (microwave, computer, phone):no  Forgets  the month or year: no  Mismanaging finances: no  Remembering appts:no Daily problems with thinking and/or memory:no Ad8 score is=0         Immunizations Immunization History  Administered Date(s) Administered  . Fluad Quad(high Dose 65+) 05/11/2019  . Hepatitis B 01/27/2013  . Influenza Split 06/28/2012  . Influenza, High Dose Seasonal PF 10/05/2016, 06/15/2017, 06/16/2018  . Influenza,inj,Quad PF,6+ Mos 07/25/2013, 06/14/2014, 07/11/2015  . Influenza-Unspecified 06/16/2018  . PFIZER SARS-COV-2 Vaccination 11/02/2019, 12/01/2019  . Pneumococcal Conjugate-13 06/16/2018  . Tdap 02/03/2013  . Zoster 04/22/2010  . Zoster Recombinat (Shingrix) 12/29/2016, 03/02/2017    TDAP status: Up to date Flu Vaccine status: Up to date Covid-19 vaccine status: Completed vaccines  Qualifies for Shingles Vaccine? Yes   Zostavax completed Yes   Shingrix Completed?: Yes  Screening Tests Health Maintenance  Topic Date Due  . URINE MICROALBUMIN  07/10/2016  . FOOT EXAM  03/16/2019  . PNA vac Low Risk Adult (2 of 2 - PPSV23) 06/17/2019  . INFLUENZA VACCINE  04/07/2020  . MAMMOGRAM  07/23/2020  . HEMOGLOBIN A1C  09/07/2020  . OPHTHALMOLOGY EXAM  03/14/2021  . COLONOSCOPY  09/15/2021  . TETANUS/TDAP  02/04/2023  . DEXA SCAN  Completed  . COVID-19 Vaccine  Completed  . Hepatitis C Screening  Completed    Health Maintenance  Health Maintenance Due  Topic Date Due  . URINE MICROALBUMIN  07/10/2016  . FOOT EXAM  03/16/2019  . PNA vac Low Risk Adult (2 of 2 - PPSV23) 06/17/2019    Colorectal cancer screening: Completed 09/15/16. Repeat every 5 years Mammogram status: Completed 07/24/19. Repeat every year Bone Density status: Completed 04/26/17. Results reflect: Bone density results: OSTEOPENIA. Repeat every 2 years.  Lung Cancer Screening: (Low Dose CT Chest recommended if Age 61-80 years, 30 pack-year currently smoking OR have quit w/in 15years.) does not qualify.    Additional  Screening:  Hepatitis C Screening: does qualify; Completed 07/11/15  Vision Screening: Recommended annual ophthalmology exams for early detection of glaucoma and other disorders of the eye. Is the patient up to date with their annual eye exam?  Yes  Who is the provider or what is the name of the office in which the patient attends annual eye exams? Dr. Lynn Ito   Dental Screening: Recommended annual dental exams for proper oral hygiene  Community Resource Referral / Chronic Care Management: CRR required this visit?  No   CCM required this visit?  No      Plan:    Please schedule your next medicare wellness visit with me in 1 yr.  Continue to eat heart healthy diet (full of fruits, vegetables, whole grains, lean protein, water--limit salt, fat, and sugar intake) and increase physical activity as tolerated.  Continue doing brain stimulating activities (puzzles, reading, adult coloring books, staying active) to keep memory sharp.   Bring a copy of your living will and/or healthcare power of attorney to your next office visit.   I have personally reviewed and noted the following in the patient's chart:   . Medical and social history . Use of alcohol, tobacco or illicit drugs  . Current medications  and supplements . Functional ability and status . Nutritional status . Physical activity . Advanced directives . List of other physicians . Hospitalizations, surgeries, and ER visits in previous 12 months . Vitals . Screenings to include cognitive, depression, and falls . Referrals and appointments  In addition, I have reviewed and discussed with patient certain preventive protocols, quality metrics, and best practice recommendations. A written personalized care plan for preventive services as well as general preventive health recommendations were provided to patient.    Due to this being a telephonic visit, the after visit summary with patients personalized plan was offered to  patient via mail or my-chart.  Patient would like to access on my-chart.  Shela Nevin, [place]   03/21/2020   Nurse Notes:  Works part to full time as Training and development officer.

## 2020-03-21 ENCOUNTER — Encounter: Payer: Self-pay | Admitting: *Deleted

## 2020-03-21 ENCOUNTER — Ambulatory Visit (INDEPENDENT_AMBULATORY_CARE_PROVIDER_SITE_OTHER): Payer: Medicare HMO | Admitting: *Deleted

## 2020-03-21 ENCOUNTER — Other Ambulatory Visit: Payer: Self-pay

## 2020-03-21 DIAGNOSIS — Z0000 Encounter for general adult medical examination without abnormal findings: Secondary | ICD-10-CM

## 2020-03-21 NOTE — Patient Instructions (Signed)
Destiny Romero , Thank you for taking time to come for your Medicare Wellness Visit. I appreciate your ongoing commitment to your health goals. Please review the following plan we discussed and let me know if I can assist you in the future.   Screening recommendations/referrals: Colorectal cancer screening: Completed 09/15/16. Repeat every 5 years Mammogram status: Completed 07/24/19. Repeat every year Bone Density status: Completed 04/26/17. Results reflect: Bone density results: OSTEOPENIA. Repeat every 2 years. Recommended yearly ophthalmology/optometry visit for glaucoma screening and checkup Recommended yearly dental visit for hygiene and checkup  Vaccinations: TDAP status: Up to date Flu Vaccine status: Up to date Covid-19 vaccine status: Completed vaccines  Qualifies for Shingles Vaccine? Yes   Zostavax completed Yes   Shingrix Completed?: Yes  Advanced directives: Bring a copy of your living will and/or healthcare power of attorney to your next office visit.   Next appointment: Follow up in one year for your annual wellness visit   Preventive Care 65 Years and Older, Female Preventive care refers to lifestyle choices and visits with your health care provider that can promote health and wellness. What does preventive care include?  A yearly physical exam. This is also called an annual well check.  Dental exams once or twice a year.  Routine eye exams. Ask your health care provider how often you should have your eyes checked.  Personal lifestyle choices, including:  Daily care of your teeth and gums.  Regular physical activity.  Eating a healthy diet.  Avoiding tobacco and drug use.  Limiting alcohol use.  Practicing safe sex.  Taking low-dose aspirin every day.  Taking vitamin and mineral supplements as recommended by your health care provider. What happens during an annual well check? The services and screenings done by your health care provider during your  annual well check will depend on your age, overall health, lifestyle risk factors, and family history of disease. Counseling  Your health care provider may ask you questions about your:  Alcohol use.  Tobacco use.  Drug use.  Emotional well-being.  Home and relationship well-being.  Sexual activity.  Eating habits.  History of falls.  Memory and ability to understand (cognition).  Work and work Statistician.  Reproductive health. Screening  You may have the following tests or measurements:  Height, weight, and BMI.  Blood pressure.  Lipid and cholesterol levels. These may be checked every 5 years, or more frequently if you are over 74 years old.  Skin check.  Lung cancer screening. You may have this screening every year starting at age 61 if you have a 30-pack-year history of smoking and currently smoke or have quit within the past 15 years.  Fecal occult blood test (FOBT) of the stool. You may have this test every year starting at age 23.  Flexible sigmoidoscopy or colonoscopy. You may have a sigmoidoscopy every 5 years or a colonoscopy every 10 years starting at age 36.  Hepatitis C blood test.  Hepatitis B blood test.  Sexually transmitted disease (STD) testing.  Diabetes screening. This is done by checking your blood sugar (glucose) after you have not eaten for a while (fasting). You may have this done every 1-3 years.  Bone density scan. This is done to screen for osteoporosis. You may have this done starting at age 15.  Mammogram. This may be done every 1-2 years. Talk to your health care provider about how often you should have regular mammograms. Talk with your health care provider about your test results, treatment  options, and if necessary, the need for more tests. Vaccines  Your health care provider may recommend certain vaccines, such as:  Influenza vaccine. This is recommended every year.  Tetanus, diphtheria, and acellular pertussis (Tdap, Td)  vaccine. You may need a Td booster every 10 years.  Zoster vaccine. You may need this after age 29.  Pneumococcal 13-valent conjugate (PCV13) vaccine. One dose is recommended after age 84.  Pneumococcal polysaccharide (PPSV23) vaccine. One dose is recommended after age 23. Talk to your health care provider about which screenings and vaccines you need and how often you need them. This information is not intended to replace advice given to you by your health care provider. Make sure you discuss any questions you have with your health care provider. Document Released: 09/20/2015 Document Revised: 05/13/2016 Document Reviewed: 06/25/2015 Elsevier Interactive Patient Education  2017 [place] Prevention in the Home Falls can cause injuries. They can happen to people of all ages. There are many things you can do to make your home safe and to help prevent falls. What can I do on the outside of my home?  Regularly fix the edges of walkways and driveways and fix any cracks.  Remove anything that might make you trip as you walk through a door, such as a raised step or threshold.  Trim any bushes or trees on the path to your home.  Use bright outdoor lighting.  Clear any walking paths of anything that might make someone trip, such as rocks or tools.  Regularly check to see if handrails are loose or broken. Make sure that both sides of any steps have handrails.  Any raised decks and porches should have guardrails on the edges.  Have any leaves, snow, or ice cleared regularly.  Use sand or salt on walking paths during winter.  Clean up any spills in your garage right away. This includes oil or grease spills. What can I do in the bathroom?  Use night lights.  Install grab bars by the toilet and in the tub and shower. Do not use towel bars as grab bars.  Use non-skid mats or decals in the tub or shower.  If you need to sit down in the shower, use a plastic, non-slip  stool.  Keep the floor dry. Clean up any water that spills on the floor as soon as it happens.  Remove soap buildup in the tub or shower regularly.  Attach bath mats securely with double-sided non-slip rug tape.  Do not have throw rugs and other things on the floor that can make you trip. What can I do in the bedroom?  Use night lights.  Make sure that you have a light by your bed that is easy to reach.  Do not use any sheets or blankets that are too big for your bed. They should not hang down onto the floor.  Have a firm chair that has side arms. You can use this for support while you get dressed.  Do not have throw rugs and other things on the floor that can make you trip. What can I do in the kitchen?  Clean up any spills right away.  Avoid walking on wet floors.  Keep items that you use a lot in easy-to-reach places.  If you need to reach something above you, use a strong step stool that has a grab bar.  Keep electrical cords out of the way.  Do not use floor polish or wax that makes floors slippery.  If you must use wax, use non-skid floor wax.  Do not have throw rugs and other things on the floor that can make you trip. What can I do with my stairs?  Do not leave any items on the stairs.  Make sure that there are handrails on both sides of the stairs and use them. Fix handrails that are broken or loose. Make sure that handrails are as long as the stairways.  Check any carpeting to make sure that it is firmly attached to the stairs. Fix any carpet that is loose or worn.  Avoid having throw rugs at the top or bottom of the stairs. If you do have throw rugs, attach them to the floor with carpet tape.  Make sure that you have a light switch at the top of the stairs and the bottom of the stairs. If you do not have them, ask someone to add them for you. What else can I do to help prevent falls?  Wear shoes that:  Do not have high heels.  Have rubber bottoms.  Are  comfortable and fit you well.  Are closed at the toe. Do not wear sandals.  If you use a stepladder:  Make sure that it is fully opened. Do not climb a closed stepladder.  Make sure that both sides of the stepladder are locked into place.  Ask someone to hold it for you, if possible.  Clearly mark and make sure that you can see:  Any grab bars or handrails.  First and last steps.  Where the edge of each step is.  Use tools that help you move around (mobility aids) if they are needed. These include:  Canes.  Walkers.  Scooters.  Crutches.  Turn on the lights when you go into a dark area. Replace any light bulbs as soon as they burn out.  Set up your furniture so you have a clear path. Avoid moving your furniture around.  If any of your floors are uneven, fix them.  If there are any pets around you, be aware of where they are.  Review your medicines with your doctor. Some medicines can make you feel dizzy. This can increase your chance of falling. Ask your doctor what other things that you can do to help prevent falls. This information is not intended to replace advice given to you by your health care provider. Make sure you discuss any questions you have with your health care provider. Document Released: 06/20/2009 Document Revised: 01/30/2016 Document Reviewed: 09/28/2014 Elsevier Interactive Patient Education  2017 [employer].

## 2020-04-01 ENCOUNTER — Other Ambulatory Visit: Payer: Self-pay | Admitting: Family Medicine

## 2020-04-04 DIAGNOSIS — L570 Actinic keratosis: Secondary | ICD-10-CM | POA: Diagnosis not present

## 2020-04-04 DIAGNOSIS — Z85828 Personal history of other malignant neoplasm of skin: Secondary | ICD-10-CM | POA: Diagnosis not present

## 2020-04-08 ENCOUNTER — Encounter: Payer: Self-pay | Admitting: Family Medicine

## 2020-04-09 ENCOUNTER — Other Ambulatory Visit: Payer: Self-pay

## 2020-04-09 ENCOUNTER — Other Ambulatory Visit (INDEPENDENT_AMBULATORY_CARE_PROVIDER_SITE_OTHER): Payer: Medicare HMO

## 2020-04-09 DIAGNOSIS — R7989 Other specified abnormal findings of blood chemistry: Secondary | ICD-10-CM | POA: Diagnosis not present

## 2020-04-09 LAB — CBC WITH DIFFERENTIAL/PLATELET
Basophils Absolute: 0 10*3/uL (ref 0.0–0.1)
Basophils Relative: 0.4 % (ref 0.0–3.0)
Eosinophils Absolute: 0.5 10*3/uL (ref 0.0–0.7)
Eosinophils Relative: 6.4 % — ABNORMAL HIGH (ref 0.0–5.0)
HCT: 35.2 % — ABNORMAL LOW (ref 36.0–46.0)
Hemoglobin: 11.7 g/dL — ABNORMAL LOW (ref 12.0–15.0)
Lymphocytes Relative: 52.8 % — ABNORMAL HIGH (ref 12.0–46.0)
Lymphs Abs: 4.4 10*3/uL — ABNORMAL HIGH (ref 0.7–4.0)
MCHC: 33.3 g/dL (ref 30.0–36.0)
MCV: 89.7 fl (ref 78.0–100.0)
Monocytes Absolute: 0.7 10*3/uL (ref 0.1–1.0)
Monocytes Relative: 8 % (ref 3.0–12.0)
Neutro Abs: 2.7 10*3/uL (ref 1.4–7.7)
Neutrophils Relative %: 32.4 % — ABNORMAL LOW (ref 43.0–77.0)
Platelets: 256 10*3/uL (ref 150.0–400.0)
RBC: 3.92 Mil/uL (ref 3.87–5.11)
RDW: 13.9 % (ref 11.5–15.5)
WBC: 8.3 10*3/uL (ref 4.0–10.5)

## 2020-04-10 NOTE — Telephone Encounter (Signed)
Renal Panel can not be added on.

## 2020-04-11 ENCOUNTER — Other Ambulatory Visit: Payer: Self-pay | Admitting: *Deleted

## 2020-04-11 DIAGNOSIS — G5602 Carpal tunnel syndrome, left upper limb: Secondary | ICD-10-CM | POA: Insufficient documentation

## 2020-04-11 DIAGNOSIS — G5601 Carpal tunnel syndrome, right upper limb: Secondary | ICD-10-CM | POA: Diagnosis not present

## 2020-04-11 DIAGNOSIS — M79641 Pain in right hand: Secondary | ICD-10-CM | POA: Diagnosis not present

## 2020-04-11 DIAGNOSIS — D649 Anemia, unspecified: Secondary | ICD-10-CM

## 2020-04-11 DIAGNOSIS — M79642 Pain in left hand: Secondary | ICD-10-CM | POA: Diagnosis not present

## 2020-04-11 DIAGNOSIS — M189 Osteoarthritis of first carpometacarpal joint, unspecified: Secondary | ICD-10-CM | POA: Diagnosis not present

## 2020-04-16 ENCOUNTER — Encounter: Payer: Self-pay | Admitting: Family Medicine

## 2020-04-17 DIAGNOSIS — R69 Illness, unspecified: Secondary | ICD-10-CM | POA: Diagnosis not present

## 2020-04-22 DIAGNOSIS — G5603 Carpal tunnel syndrome, bilateral upper limbs: Secondary | ICD-10-CM | POA: Diagnosis not present

## 2020-04-25 DIAGNOSIS — R69 Illness, unspecified: Secondary | ICD-10-CM | POA: Diagnosis not present

## 2020-04-30 DIAGNOSIS — G5603 Carpal tunnel syndrome, bilateral upper limbs: Secondary | ICD-10-CM | POA: Diagnosis not present

## 2020-05-08 ENCOUNTER — Other Ambulatory Visit (INDEPENDENT_AMBULATORY_CARE_PROVIDER_SITE_OTHER): Payer: Medicare HMO

## 2020-05-08 ENCOUNTER — Telehealth: Payer: Self-pay | Admitting: *Deleted

## 2020-05-08 DIAGNOSIS — D649 Anemia, unspecified: Secondary | ICD-10-CM | POA: Diagnosis not present

## 2020-05-08 HISTORY — PX: CARPAL TUNNEL RELEASE: SHX101

## 2020-05-08 LAB — FECAL OCCULT BLOOD, IMMUNOCHEMICAL: Fecal Occult Bld: POSITIVE — AB

## 2020-05-08 NOTE — Telephone Encounter (Signed)
See lab report for note

## 2020-05-08 NOTE — Telephone Encounter (Signed)
Received call from [facility] Lab reporting Positive IFOB.

## 2020-05-09 ENCOUNTER — Encounter: Payer: Self-pay | Admitting: Family Medicine

## 2020-05-09 ENCOUNTER — Other Ambulatory Visit: Payer: Self-pay | Admitting: Family Medicine

## 2020-05-09 DIAGNOSIS — R195 Other fecal abnormalities: Secondary | ICD-10-CM

## 2020-05-09 DIAGNOSIS — D649 Anemia, unspecified: Secondary | ICD-10-CM

## 2020-05-09 NOTE — Telephone Encounter (Signed)
Patient viewed labs& stated she would like a further follow up based off labs

## 2020-05-09 NOTE — Telephone Encounter (Signed)
I have referred her to gastroenterology. I just need to clarify that she knows that from my previous phone note. With the anemia and heme positive stool she needs to be evaluated by them. Would also be happy to do a VV with her to discuss her other labs and any other concerns she has

## 2020-05-10 NOTE — Telephone Encounter (Signed)
See lab notes and mychart message.

## 2020-05-14 DIAGNOSIS — G5601 Carpal tunnel syndrome, right upper limb: Secondary | ICD-10-CM | POA: Diagnosis not present

## 2020-05-15 ENCOUNTER — Other Ambulatory Visit: Payer: Medicare HMO

## 2020-05-17 ENCOUNTER — Other Ambulatory Visit: Payer: Self-pay | Admitting: Family Medicine

## 2020-05-20 ENCOUNTER — Other Ambulatory Visit: Payer: Self-pay

## 2020-05-20 ENCOUNTER — Other Ambulatory Visit (INDEPENDENT_AMBULATORY_CARE_PROVIDER_SITE_OTHER): Payer: Medicare HMO

## 2020-05-20 ENCOUNTER — Other Ambulatory Visit: Payer: Medicare HMO

## 2020-05-20 DIAGNOSIS — D649 Anemia, unspecified: Secondary | ICD-10-CM

## 2020-05-20 LAB — CBC WITH DIFFERENTIAL/PLATELET
Absolute Monocytes: 621 cells/uL (ref 200–950)
Basophils Absolute: 28 cells/uL (ref 0–200)
Basophils Relative: 0.4 %
Eosinophils Absolute: 400 cells/uL (ref 15–500)
Eosinophils Relative: 5.8 %
HCT: 35.2 % (ref 35.0–45.0)
Hemoglobin: 11.9 g/dL (ref 11.7–15.5)
Lymphs Abs: 3326 cells/uL (ref 850–3900)
MCH: 30 pg (ref 27.0–33.0)
MCHC: 33.8 g/dL (ref 32.0–36.0)
MCV: 88.7 fL (ref 80.0–100.0)
MPV: 9.7 fL (ref 7.5–12.5)
Monocytes Relative: 9 %
Neutro Abs: 2525 cells/uL (ref 1500–7800)
Neutrophils Relative %: 36.6 %
Platelets: 260 10*3/uL (ref 140–400)
RBC: 3.97 10*6/uL (ref 3.80–5.10)
RDW: 13.4 % (ref 11.0–15.0)
Total Lymphocyte: 48.2 %
WBC: 6.9 10*3/uL (ref 3.8–10.8)

## 2020-05-20 NOTE — Addendum Note (Signed)
Addended by: Kelle Darting A on: 05/20/2020 09:31 AM   Modules accepted: Orders

## 2020-05-21 ENCOUNTER — Other Ambulatory Visit: Payer: Medicare HMO

## 2020-05-30 DIAGNOSIS — Z4789 Encounter for other orthopedic aftercare: Secondary | ICD-10-CM | POA: Diagnosis not present

## 2020-05-30 DIAGNOSIS — G5601 Carpal tunnel syndrome, right upper limb: Secondary | ICD-10-CM | POA: Diagnosis not present

## 2020-06-07 ENCOUNTER — Encounter: Payer: Self-pay | Admitting: Family Medicine

## 2020-06-07 DIAGNOSIS — M2022 Hallux rigidus, left foot: Secondary | ICD-10-CM | POA: Diagnosis not present

## 2020-06-07 DIAGNOSIS — M2021 Hallux rigidus, right foot: Secondary | ICD-10-CM | POA: Diagnosis not present

## 2020-06-09 ENCOUNTER — Other Ambulatory Visit: Payer: Self-pay | Admitting: Family Medicine

## 2020-06-09 DIAGNOSIS — M1990 Unspecified osteoarthritis, unspecified site: Secondary | ICD-10-CM

## 2020-06-15 ENCOUNTER — Other Ambulatory Visit: Payer: Self-pay | Admitting: Family Medicine

## 2020-07-08 DIAGNOSIS — F4310 Post-traumatic stress disorder, unspecified: Secondary | ICD-10-CM | POA: Diagnosis not present

## 2020-07-08 DIAGNOSIS — R69 Illness, unspecified: Secondary | ICD-10-CM | POA: Diagnosis not present

## 2020-07-14 ENCOUNTER — Other Ambulatory Visit: Payer: Self-pay | Admitting: Family Medicine

## 2020-07-15 DIAGNOSIS — R69 Illness, unspecified: Secondary | ICD-10-CM | POA: Diagnosis not present

## 2020-07-29 DIAGNOSIS — Z1231 Encounter for screening mammogram for malignant neoplasm of breast: Secondary | ICD-10-CM | POA: Diagnosis not present

## 2020-07-29 LAB — HM MAMMOGRAPHY

## 2020-09-08 DIAGNOSIS — R051 Acute cough: Secondary | ICD-10-CM | POA: Diagnosis not present

## 2020-09-08 DIAGNOSIS — Z1152 Encounter for screening for COVID-19: Secondary | ICD-10-CM | POA: Diagnosis not present

## 2020-09-08 DIAGNOSIS — J019 Acute sinusitis, unspecified: Secondary | ICD-10-CM | POA: Diagnosis not present

## 2020-09-19 DIAGNOSIS — G5601 Carpal tunnel syndrome, right upper limb: Secondary | ICD-10-CM | POA: Diagnosis not present

## 2020-10-11 DIAGNOSIS — R051 Acute cough: Secondary | ICD-10-CM | POA: Diagnosis not present

## 2020-10-11 DIAGNOSIS — R0982 Postnasal drip: Secondary | ICD-10-CM | POA: Diagnosis not present

## 2020-10-11 DIAGNOSIS — J209 Acute bronchitis, unspecified: Secondary | ICD-10-CM | POA: Diagnosis not present

## 2020-10-11 DIAGNOSIS — J302 Other seasonal allergic rhinitis: Secondary | ICD-10-CM | POA: Diagnosis not present

## 2020-10-13 ENCOUNTER — Other Ambulatory Visit: Payer: Self-pay | Admitting: Family Medicine

## 2020-10-16 ENCOUNTER — Encounter: Payer: Self-pay | Admitting: Family Medicine

## 2020-10-16 DIAGNOSIS — M25551 Pain in right hip: Secondary | ICD-10-CM | POA: Diagnosis not present

## 2020-10-17 NOTE — Telephone Encounter (Signed)
Pt is scheduled for March 1 at 3:40

## 2020-10-30 ENCOUNTER — Ambulatory Visit: Payer: Medicare HMO | Attending: Physician Assistant

## 2020-10-30 ENCOUNTER — Other Ambulatory Visit: Payer: Self-pay

## 2020-10-30 DIAGNOSIS — G8929 Other chronic pain: Secondary | ICD-10-CM | POA: Diagnosis not present

## 2020-10-30 DIAGNOSIS — M545 Low back pain, unspecified: Secondary | ICD-10-CM | POA: Insufficient documentation

## 2020-10-30 DIAGNOSIS — M6281 Muscle weakness (generalized): Secondary | ICD-10-CM | POA: Diagnosis not present

## 2020-10-30 DIAGNOSIS — M62830 Muscle spasm of back: Secondary | ICD-10-CM | POA: Diagnosis not present

## 2020-10-30 DIAGNOSIS — M25551 Pain in right hip: Secondary | ICD-10-CM | POA: Diagnosis not present

## 2020-10-30 DIAGNOSIS — M25651 Stiffness of right hip, not elsewhere classified: Secondary | ICD-10-CM | POA: Diagnosis not present

## 2020-10-30 DIAGNOSIS — R2689 Other abnormalities of gait and mobility: Secondary | ICD-10-CM | POA: Diagnosis not present

## 2020-10-31 NOTE — Therapy (Signed)
[address] [place], [place], [address] Phone: [phone]   Fax:  [phone]  Physical Therapy Evaluation  Patient Details  Name: Destiny Romero MRN: [id number] Date of Birth: 04/24/1951 Referring Provider (PT): Gerrit Halls PA-C , [place]   Encounter Date: 10/30/2020   PT End of Session - 10/30/20 1759    Visit Number 1    Number of Visits 9    Date for PT Re-Evaluation 12/25/20    PT Start Time 7517    PT Stop Time 1700    PT Time Calculation (min) 45 min    Activity Tolerance Patient tolerated treatment well;Patient limited by pain    Behavior During Therapy [facility] for tasks assessed/performed           Past Medical History:  Diagnosis Date  . Allergy   . Anal lesion 2007  . Anemia   . Anesthesia complication    Per pt/ sensitive to sedation!  . Central retinal artery occlusion 06/08/2016   limited vision right eye.  Marland Kitchen Cervical dysplasia 04/1991   Dr Rica Koyanagi - CIN I, cone and freeze in 1990s with normal paps since  . Constipation 06/08/2016   moves bowels every 3 rd day   . Decreased visual acuity 06/08/2016   Retinal bleed right eye 2017  . Depression   . Fatigue 06/27/2017  . H/O measles   . H/O mumps   . History of chicken pox   . Mitral valve prolapse    NO MR; no SBE prophylaxis needed  . PONV (postoperative nausea and vomiting)   . Pre-diabetes    highest A1c 6.4 %  . Preventative health care 12/21/2016  . Squamous cell carcinoma in situ of skin    Dr Syble Creek  . Vision impairment    limited vision in right eye.    Past Surgical History:  Procedure Laterality Date  . CERVICAL CONE BIOPSY    . COLONOSCOPY  2003  . COLONOSCOPY  04/01/2011   [place] - repeat in 5 years  . COLONOSCOPY W/ POLYPECTOMY  2007   Dr Earlean Shawl  . EYE SURGERY Bilateral    cataracts  . LAMINECTOMY     1982  . OOPHORECTOMY Left 12/2011   Dr Ubaldo Glassing ; ovarian cyst  . RECTAL SURGERY  05/28/06   pre-canerous  lesion removed in 2007; Dr Morton Stall, [facility]  . rt foot morton's surgery    . TUBAL LIGATION      There were no vitals filed for this visit.    Subjective Assessment - 10/30/20 1619    Subjective Right hip pain got worse over time over past few months. Pt reports she has arthritis in the R hip, arthritis and bone spurs in R big toe, also has hx of lumbar surgery 40 yrs ago (non fusion post herniated disc) with history of pain at back dimples and going into side and front of thighs B.    Pertinent History Carpal tunnel release September 2021, T2DM, mitral valve prolapse, old low back surgery, B hand arthitis    Limitations Sitting;Standing;Walking;Reading;House hold activities    How long can you sit comfortably? 1 hr    How long can you walk comfortably? Varies depending on howlong sitting, how fast going, hasnt been walking as much over past few weeks because of the pain    Diagnostic tests per pt had xrays    Patient Stated Goals to decrease pain and arthitis, to get stronger be able to  move better    Currently in Pain? Yes    Pain Location Hip    Pain Orientation Right    Pain Descriptors / Indicators --   Will get sharp anterior hip and big toe pain with certain mvmts, overall lateral hip pain achy   Pain Onset More than a month ago    Pain Frequency Constant   Decreased pain when still/not moving, increases when getting up and moving   Aggravating Factors  walking down a slope, pivoting on the leg    Pain Relieving Factors ice, CBD topical cream    Effect of Pain on Daily Activities Previously walking 2+ mi a day, no longer able to walk without limping              [facility] PT Assessment - 10/30/20 1630      Assessment   Medical Diagnosis R hip pain trochanteric bursitis    Referring Provider (PT) Gerrit Halls PA-C , [facility]    Hand Dominance Right    Next MD Visit Follow up with Dr Wynelle Link in a few weeks March 24th    Prior Therapy no      Balance Screen   Has the patient  fallen in the past 6 months No    Has the patient had a decrease in activity level because of a fear of falling?  No   Just increasingly cautious   Is the patient reluctant to leave their home because of a fear of falling?  No      Home Environment   Additional Comments Townhouse. Alone.  1 step to get in and no stairs inside.      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 Playing piano, knitting - limited by hand arthitis and pain now      Cognition   Overall Cognitive Status Within Functional Limits for tasks assessed      ROM / Strength   AROM / PROM / Strength AROM;Strength      AROM   Overall AROM Comments Lumbar AROM: 25% extension full flexion WFL, 50% to mid lateral thigh with sidebending B limited by low lumbar pain      Strength   Strength Assessment Site Hip;Knee;Ankle    Right/Left Hip Right;Left    Right Hip Flexion 3+/5   anterior hipgroin discomfort   Right Hip ABduction 3+/5    Left Hip Flexion 3+/5    Left Hip ABduction 3+/5    Right/Left Knee Right;Left    Right Knee Flexion 4/5    Right Knee Extension 4/5    Left Knee Flexion 4/5    Left Knee Extension 4/5      Flexibility   Soft Tissue Assessment /Muscle Length yes    Hamstrings mod tightness B    Quadriceps mod tightness B    ITB mild tightness R    Piriformis mod tightness B      Palpation   Palpation comment TTP with areas of spasm R low lumbar spine, R piriformis/glut, R quadriceps and lateral thigh      Transfers   Five time sit to stand comments  9 seconds. incr R knee pain. dynamic valgu and WS to the left      Ambulation/Gait   Gait Comments antalgic, trendelenburg      Balance   Balance Assessed Yes    Balance comment  Decreased functional balance noted with standing march, hip abduction. Decreased narrow base and SL balance.                      Objective  measurements completed on examination: See above findings.       [facility] Adult PT Treatment/Exercise - 10/31/20 0001      Exercises   Exercises Knee/Hip;Lumbar      Lumbar Exercises: Stretches   Other Lumbar Stretch Exercise Supine Bridge x 5. Seated Marching with Opposite Shoulder Flexion -x 10 B. Seated Hamstring Stretch30" x 2. Seated Figure 4 Piriformis Stretch 20" x 2 Standing Hip Abduction with Support x10 B                  PT Education - 10/30/20 1758    Education Details PT POC, Initial HEP: Access Code: [email or web address] : Supine Bridge - 1 x daily - 5 x weekly - 2 sets - 10 reps  Seated Marching with Opposite Shoulder Flexion - 1 x daily - 5 x weekly - 2 sets - 10 reps  Seated Hamstring Stretch - 1 x daily - 5 x weekly - 4 sets - 30 seconds hold  Seated Figure 4 Piriformis Stretch - 1 x daily - 5 x weekly - 30 second hold  Standing Hip Abduction with Counter Support - 1 x daily - 5 x weekly - 2 sets - 10 reps    Person(s) Educated Patient    Methods Explanation;Demonstration;Verbal cues;Handout    Comprehension Verbalized understanding;Returned demonstration            PT Short Term Goals - 10/30/20 1759      PT SHORT TERM GOAL #1   Title Independent with initial HEP    Time 2    Period Weeks    Status New    Target Date 11/14/20             PT Long Term Goals - 10/30/20 1800      PT LONG TERM GOAL #1   Title Independent with advanced HEP    Time 8    Period Weeks    Status New    Target Date 12/26/20      PT LONG TERM GOAL #2   Title </= 2/10 pain with ADLs    Time 8    Period Weeks    Status New    Target Date 12/26/20      PT LONG TERM GOAL #3   Title Report tolerance to walking long distance and walking for prolonged time with </= 2/10 pain    Time 8    Period Weeks    Status New    Target Date 12/26/20      PT LONG TERM GOAL #4   Title improve BLE strength to at least 4+/5    Time 8    Period Weeks    Status New    Target Date 12/26/20                   Plan - 10/30/20 1759    Clinical Impression Statement Pt presents with Right hip pain that has gotten worse over past few months. Pt reports she has arthritis in the R hip, arthritis and bone spurs in R big toe, also has hx of lumbar surgery 40 yrs ago (non fusion post herniated disc) with history of pain at back dimples and going into side and front of thighs B. She presents with proximal core  and hip muscle weakness, decreased flexibility, decreased functional balance and antalgic gait. She will benefit from skilled physical therapy to work on decreasing pain, improving strength and functional mobility.    Personal Factors and Comorbidities Past/Current Experience;Comorbidity 3+;Time since onset of injury/illness/exacerbation    Examination-Activity Limitations Locomotion Level;Sit;Squat;Stairs;Stand;Lift    Examination-Participation Restrictions Community Activity;Shop    Stability/Clinical Decision Making Stable/Uncomplicated    Clinical Decision Making Low    Rehab Potential Good    PT Frequency 1x / week    PT Duration 8 weeks    PT Treatment/Interventions ADLs/Self Care Home Management;Cryotherapy;Electrical Stimulation;Iontophoresis 4mg /ml Dexamethasone;Moist Heat;Gait training;Neuromuscular re-education;Balance training;Therapeutic exercise;Therapeutic activities;Functional mobility training;Patient/family education;Manual techniques;Taping;Vasopneumatic Device;Energy conservation    PT Next Visit Plan Hip FOTO next visit. Reassess HEP. Gradually progress TE with focus on endurance, proximal core/hip strengthening, and narrow base balance as tolerated. Manual and modalities as needed    PT Home Exercise Plan see pt edu    Consulted and Agree with Plan of Care Patient           Patient will benefit from skilled therapeutic intervention in order to improve the following deficits and impairments:  Abnormal gait,Decreased range of motion,Increased muscle  spasms,Pain,Decreased balance,Decreased mobility,Decreased strength,Improper body mechanics,Impaired flexibility  Visit Diagnosis: Pain in right hip - Plan: PT plan of care cert/re-cert  Muscle weakness (generalized) - Plan: PT plan of care cert/re-cert  Other abnormalities of gait and mobility - Plan: PT plan of care cert/re-cert  Stiffness of right hip, not elsewhere classified - Plan: PT plan of care cert/re-cert  Chronic low back pain, unspecified back pain laterality, unspecified whether sciatica present - Plan: PT plan of care cert/re-cert  Muscle spasm of back - Plan: PT plan of care cert/re-cert     Problem List Patient Active Problem List   Diagnosis Date Noted  . Bilateral hand pain 03/14/2020  . Toe pain, right 03/14/2020  . Acid reflux 11/27/2019  . SOB (shortness of breath) 11/07/2018  . Atypical chest pain 11/07/2018  . Overweight 11/07/2018  . Encounter for PPD test 06/24/2018  . Dupuytren's contracture of both hands 05/30/2018  . Acquired trigger finger of both ring fingers 05/30/2018  . Hand pain, left 05/09/2018  . Urinary frequency 06/27/2017  . Fall 06/27/2017  . Fatigue 06/27/2017  . Preventative health care 12/21/2016  . Constipation 06/08/2016  . Decreased visual acuity 06/08/2016  . History of chicken pox   . Anemia   . Controlled type 2 diabetes with retinopathy ([place]) 08/11/2013  . Osteopenia 08/19/2012  . NEOPLASM, MALIGNANT, RECTUM 06/27/2008  . Hyperlipidemia 06/27/2008  . SKIN CANCER, HX OF 06/27/2008  . History of colonic polyps 06/27/2008  . Depression with anxiety 01/19/2007    Hall Busing , PT, DPT 10/31/2020, 10:31 AM  [address] [place], [place], [address] Phone: [phone]   Fax:  [phone]  Name: Destiny Romero MRN: [id number] Date of Birth: Jan 28, 1951

## 2020-11-05 ENCOUNTER — Telehealth: Payer: Self-pay

## 2020-11-05 ENCOUNTER — Encounter: Payer: Self-pay | Admitting: Family Medicine

## 2020-11-05 ENCOUNTER — Other Ambulatory Visit: Payer: Self-pay

## 2020-11-05 ENCOUNTER — Telehealth (INDEPENDENT_AMBULATORY_CARE_PROVIDER_SITE_OTHER): Payer: Medicare HMO | Admitting: Family Medicine

## 2020-11-05 VITALS — Temp 97.5°F

## 2020-11-05 DIAGNOSIS — K219 Gastro-esophageal reflux disease without esophagitis: Secondary | ICD-10-CM

## 2020-11-05 DIAGNOSIS — R350 Frequency of micturition: Secondary | ICD-10-CM

## 2020-11-05 DIAGNOSIS — M1990 Unspecified osteoarthritis, unspecified site: Secondary | ICD-10-CM

## 2020-11-05 DIAGNOSIS — M2550 Pain in unspecified joint: Secondary | ICD-10-CM

## 2020-11-05 DIAGNOSIS — E785 Hyperlipidemia, unspecified: Secondary | ICD-10-CM | POA: Diagnosis not present

## 2020-11-05 DIAGNOSIS — E11319 Type 2 diabetes mellitus with unspecified diabetic retinopathy without macular edema: Secondary | ICD-10-CM

## 2020-11-05 MED ORDER — FAMOTIDINE 40 MG PO TABS
40.0000 mg | ORAL_TABLET | Freq: Every day | ORAL | 3 refills | Status: DC
Start: 2020-11-05 — End: 2021-02-18

## 2020-11-05 NOTE — Telephone Encounter (Signed)
Lvm to call back to schedule lab appointment for early next week and f/u for summertime

## 2020-11-05 NOTE — Assessment & Plan Note (Signed)
hgba1c acceptable, minimize simple carbs. Increase exercise as tolerated.  

## 2020-11-05 NOTE — Assessment & Plan Note (Signed)
Encouraged heart healthy diet, increase exercise, avoid trans fats, consider a krill oil cap daily 

## 2020-11-08 ENCOUNTER — Ambulatory Visit: Payer: Medicare HMO

## 2020-11-09 ENCOUNTER — Other Ambulatory Visit: Payer: Self-pay | Admitting: Family Medicine

## 2020-11-10 DIAGNOSIS — M19042 Primary osteoarthritis, left hand: Secondary | ICD-10-CM | POA: Insufficient documentation

## 2020-11-10 DIAGNOSIS — M1990 Unspecified osteoarthritis, unspecified site: Secondary | ICD-10-CM | POA: Insufficient documentation

## 2020-11-10 DIAGNOSIS — M19041 Primary osteoarthritis, right hand: Secondary | ICD-10-CM | POA: Insufficient documentation

## 2020-11-10 NOTE — Assessment & Plan Note (Addendum)
She notes her biggest concern is all of her joint pains. She is following with ortho Dr Maureen Ralphs secondary to her hip and back pain b tu also notes fintes and foot pains also. She has realized it is worse if she eats meat and seafood and it is better when she avoids them. As she has done so she is doing some better. She is encouraged to use topical treatments prn

## 2020-11-10 NOTE — Progress Notes (Signed)
Virtual Visit via Video Note  I connected with Destiny Romero on 11/05/20 at  3:40 PM EST by a video enabled telemedicine application and verified that I am speaking with the correct person using two identifiers.  Location: Patient: home, patient and provider are in the visit Provider: home.   I discussed the limitations of evaluation and management by telemedicine and the availability of in person appointments. The patient expressed understanding and agreed to proceed. S Chism, CMA was able to get the patient set up on a video visit    Subjective:    Patient ID: Destiny Romero, female    DOB: [birth year], 70 y.o.   MRN: [id number]  No chief complaint on file.   HPI Patient is in today for follow up on chronic medical concerns. No recent febrile illness or recent hospitalizations. Overall she has been doing well. However she has been struggling with diffuse joint pains in her hands and feet as well as in her hips and back. She notes it is worse when she eats red meat and fish. When she avoids them the pain is better. No polyuria or polydipsia. Sugars have largely been in the 100s up as high as 200.  Past Medical History:  Diagnosis Date  . Allergy   . Anal lesion 2007  . Anemia   . Anesthesia complication    Per pt/ sensitive to sedation!  . Central retinal artery occlusion 06/08/2016   limited vision right eye.  Marland Kitchen Cervical dysplasia 04/1991   Dr Rica Koyanagi - CIN I, cone and freeze in 1990s with normal paps since  . Constipation 06/08/2016   moves bowels every 3 rd day   . Decreased visual acuity 06/08/2016   Retinal bleed right eye 2017  . Depression   . Fatigue 06/27/2017  . H/O measles   . H/O mumps   . History of chicken pox   . Mitral valve prolapse    NO MR; no SBE prophylaxis needed  . PONV (postoperative nausea and vomiting)   . Pre-diabetes    highest A1c 6.4 %  . Preventative health care 12/21/2016  . Squamous cell carcinoma in situ of skin    Dr Syble Creek  . Vision  impairment    limited vision in right eye.    Past Surgical History:  Procedure Laterality Date  . CERVICAL CONE BIOPSY    . COLONOSCOPY  2003  . COLONOSCOPY  04/01/2011   [place] - repeat in 5 years  . COLONOSCOPY W/ POLYPECTOMY  2007   Dr Earlean Shawl  . EYE SURGERY Bilateral    cataracts  . LAMINECTOMY     1982  . OOPHORECTOMY Left 12/2011   Dr Ubaldo Glassing ; ovarian cyst  . RECTAL SURGERY  05/28/06   pre-canerous lesion removed in 2007; Dr Morton Stall, [facility]  . rt foot morton's surgery    . TUBAL LIGATION      Family History  Problem Relation Age of Onset  . Diabetes Mother   . Dementia Mother   . Prostate cancer Father   . COPD Father   . Heart disease Father        CAD - used nitroglycerin tablets  . Diabetes Sister        obese  . Thyroid cancer Sister   . Diabetes Brother        not obese  . Lymphoma Brother        NHL  . Cancer Brother        non Hodgkin's  Lymphoma  . Diabetes Maternal Grandmother        IDDM  . Diabetes Paternal Grandmother        IDDM  . Heart disease Paternal Grandmother   . Diabetes Maternal Aunt        X5 ; all IDDM  . Heart disease Maternal Aunt        "enlarged heart"  . Heart disease Maternal Uncle        "enlarged heart"  . Heart disease Paternal Grandfather        CAD - used nitroglycerin tablets  . Diabetes Brother   . Colon cancer Neg Hx   . Esophageal cancer Neg Hx   . Stomach cancer Neg Hx   . Stroke Neg Hx     Social History   Socioeconomic History  . Marital status: Single    Spouse name: Not on file  . Number of children: Not on file  . Years of education: Not on file  . Highest education level: Not on file  Occupational History  . Not on file  Tobacco Use  . Smoking status: Never Smoker  . Smokeless tobacco: Never Used  Vaping Use  . Vaping Use: Never used  Substance and Sexual Activity  . Alcohol use: Yes    Comment: glass wine once a month  . Drug use: No  . Sexual activity: Never    Partners: Male    Birth  control/protection: Abstinence  Other Topics Concern  . Not on file  Social History Narrative   Lives alone   No dietary restrictions   Social Determinants of Health   Financial Resource Strain: Low Risk   . Difficulty of Paying Living Expenses: Not hard at all  Food Insecurity: No Food Insecurity  . Worried About Charity fundraiser in the Last Year: Never true  . Ran Out of Food in the Last Year: Never true  Transportation Needs: No Transportation Needs  . Lack of Transportation (Medical): No  . Lack of Transportation (Non-Medical): No  Physical Activity: Not on file  Stress: Not on file  Social Connections: Not on file  Intimate Partner Violence: Not on file    Outpatient Medications Prior to Visit  Medication Sig Dispense Refill  . Calcium 200 MG TABS Take by mouth. Tale [phone] mg daily    . cetirizine (ZYRTEC) 10 MG tablet Take 10 mg by mouth daily.    . Cholecalciferol (VITAMIN D) 2000 units CAPS     . FERROCITE 324 MG TABS tablet TAKE 1 TABLET (106 MG OF IRON TOTAL) BY MOUTH DAILY. 90 tablet 1  . glimepiride (AMARYL) 1 MG tablet TAKE 1 TABLET (1 MG TOTAL) BY MOUTH 2 (TWO) TIMES DAILY WITH A MEAL. 180 tablet 1  . lamoTRIgine (LAMICTAL) 200 MG tablet Take 200 mg by mouth daily.    Marland Kitchen omega-3 acid ethyl esters (LOVAZA) 1 g capsule Take by mouth daily.    [name] Delica Lancets 30Q MISC Use to test blood sugar 3X daily.  Dx Code: E11.65 300 each 1  . ONETOUCH VERIO test strip USE TO TEST BLOOD SUGAR 3X DAILY. DX CODE: E11.65 300 strip 0  . meloxicam (MOBIC) 7.5 MG tablet Take 7.5 mg by mouth daily.    . naproxen (NAPROSYN) 500 MG tablet Take 500 mg by mouth as needed.    . Evening Primrose Oil 1000 MG CAPS      No facility-administered medications prior to visit.    Allergies  Allergen Reactions  .  Cortisone Palpitations, Other (See Comments) and Nausea And Vomiting    hyperactivity hyperactivity  . Pseudoephedrine Other (See Comments)    tachycardia tachycardia   . Atorvastatin Other (See Comments)    Low dose  . Codeine   . Fluoxetine Hcl Other (See Comments)  . Metformin Other (See Comments)    04/15/15 caused dizziness  . Metformin And Related     04/15/15 caused dizziness  . Shellfish Allergy Other (See Comments)    [other]    Review of Systems  Constitutional: Negative for fever and malaise/fatigue.  HENT: Negative for congestion.   Eyes: Negative for blurred vision.  Respiratory: Negative for shortness of breath.   Cardiovascular: Negative for chest pain, palpitations and leg swelling.  Gastrointestinal: Negative for abdominal pain, blood in stool and nausea.  Genitourinary: Negative for dysuria and frequency.  Musculoskeletal: Positive for back pain and joint pain. Negative for falls.  Skin: Negative for rash.  Neurological: Negative for dizziness, loss of consciousness and headaches.  Endo/Heme/Allergies: Negative for environmental allergies.  Psychiatric/Behavioral: Negative for depression. The patient is not nervous/anxious.        Objective:    Physical Exam Constitutional:      Appearance: Normal appearance. She is not ill-appearing.  HENT:     Head: Normocephalic and atraumatic.     Right Ear: External ear normal.     Left Ear: External ear normal.     Nose: Nose normal.  Eyes:     General:        Right eye: No discharge.        Left eye: No discharge.  Pulmonary:     Effort: Pulmonary effort is normal.  Neurological:     Mental Status: She is alert and oriented to person, place, and time.  Psychiatric:        Behavior: Behavior normal.     Temp (!) 97.5 F (36.4 C) (Temporal)   LMP 04/07/2001 (Approximate)  Wt Readings from Last 3 Encounters:  03/14/20 171 lb 3.2 oz (77.7 kg)  07/13/19 165 lb (74.8 kg)  05/11/19 161 lb 3.2 oz (73.1 kg)    Diabetic Foot Exam - Simple   No data filed    Lab Results  Component Value Date   WBC 6.9 05/20/2020   HGB 11.9 05/20/2020   HCT 35.2 05/20/2020   PLT 260  05/20/2020   GLUCOSE 120 (H) 03/14/2020   CHOL 223 (H) 03/07/2020   TRIG 102.0 03/07/2020   HDL 79.20 03/07/2020   LDLDIRECT 146.5 08/11/2013   LDLCALC 124 (H) 03/07/2020   ALT 24 03/14/2020   AST 21 03/14/2020   NA 139 03/14/2020   K 4.4 03/14/2020   CL 103 03/14/2020   CREATININE 1.10 03/14/2020   BUN 21 03/14/2020   CO2 29 03/14/2020   TSH 2.30 12/07/2019   HGBA1C 6.7 (H) 03/07/2020   MICROALBUR <0.7 07/11/2015    Lab Results  Component Value Date   TSH 2.30 12/07/2019   Lab Results  Component Value Date   WBC 6.9 05/20/2020   HGB 11.9 05/20/2020   HCT 35.2 05/20/2020   MCV 88.7 05/20/2020   PLT 260 05/20/2020   Lab Results  Component Value Date   NA 139 03/14/2020   K 4.4 03/14/2020   CO2 29 03/14/2020   GLUCOSE 120 (H) 03/14/2020   BUN 21 03/14/2020   CREATININE 1.10 03/14/2020   BILITOT 0.6 03/14/2020   ALKPHOS 40 03/14/2020   AST 21 03/14/2020   ALT 24  03/14/2020   PROT 6.7 03/14/2020   ALBUMIN 4.5 03/14/2020   CALCIUM 9.5 03/14/2020   GFR 49.02 (L) 03/14/2020   Lab Results  Component Value Date   CHOL 223 (H) 03/07/2020   Lab Results  Component Value Date   HDL 79.20 03/07/2020   Lab Results  Component Value Date   LDLCALC 124 (H) 03/07/2020   Lab Results  Component Value Date   TRIG 102.0 03/07/2020   Lab Results  Component Value Date   CHOLHDL 3 03/07/2020   Lab Results  Component Value Date   HGBA1C 6.7 (H) 03/07/2020       Assessment & Plan:   Problem List Items Addressed This Visit    Controlled type 2 diabetes with retinopathy ([place]) (Chronic)    hgba1c acceptable, minimize simple carbs. Increase exercise as tolerated.       Relevant Orders   Hemoglobin A1c   Comprehensive metabolic panel   TSH   Hyperlipidemia    Encouraged heart healthy diet, increase exercise, avoid trans fats, consider a krill oil cap daily      Relevant Orders   Lipid panel   Urinary frequency   Acid reflux - Primary    Avoid offending  foods, start probiotics. Do not eat large meals in late evening and consider raising head of bed. Given a prescription for Famotidine 40 mg daily      Relevant Medications   famotidine (PEPCID) 40 MG tablet   Other Relevant Orders   TSH   Arthritis    She notes her biggest concern is all of her joint pains. She is following with ortho Dr Maureen Ralphs secondary to her hip and back pain b tu also notes fintes and foot pains also. She has realized it is worse if she eats meat and seafood and it is better when she avoids them. As she has done so she is doing some better. She is encouraged to use topical treatments prn       Other Visit Diagnoses    Arthralgia, unspecified joint       Relevant Orders   CBC   TSH   Uric acid      I have discontinued Jonni Sanger. Fussner's naproxen, Evening Primrose Oil, and meloxicam. I am also having her start on famotidine. Additionally, I am having her maintain her lamoTRIgine, Calcium, omega-3 acid ethyl esters, cetirizine, Vitamin D, OneTouch Delica Lancets 56L, glimepiride, Ferrocite, and OneTouch Verio.  Meds ordered this encounter  Medications  . famotidine (PEPCID) 40 MG tablet    Sig: Take 1 tablet (40 mg total) by mouth at bedtime.    Dispense:  30 tablet    Refill:  3     I discussed the assessment and treatment plan with the patient. The patient was provided an opportunity to ask questions and all were answered. The patient agreed with the plan and demonstrated an understanding of the instructions.   The patient was advised to call back or seek an in-person evaluation if the symptoms worsen or if the condition fails to improve as anticipated.  I provided 20 minutes of non-face-to-face time during this encounter.   Penni Homans, MD

## 2020-11-10 NOTE — Assessment & Plan Note (Signed)
Avoid offending foods, start probiotics. Do not eat large meals in late evening and consider raising head of bed. Given a prescription for Famotidine 40 mg daily

## 2020-11-11 ENCOUNTER — Other Ambulatory Visit (INDEPENDENT_AMBULATORY_CARE_PROVIDER_SITE_OTHER): Payer: Medicare HMO

## 2020-11-11 ENCOUNTER — Other Ambulatory Visit: Payer: Self-pay

## 2020-11-11 DIAGNOSIS — E785 Hyperlipidemia, unspecified: Secondary | ICD-10-CM | POA: Diagnosis not present

## 2020-11-11 DIAGNOSIS — K219 Gastro-esophageal reflux disease without esophagitis: Secondary | ICD-10-CM

## 2020-11-11 DIAGNOSIS — E11319 Type 2 diabetes mellitus with unspecified diabetic retinopathy without macular edema: Secondary | ICD-10-CM

## 2020-11-11 DIAGNOSIS — M2550 Pain in unspecified joint: Secondary | ICD-10-CM

## 2020-11-11 LAB — COMPREHENSIVE METABOLIC PANEL
ALT: 21 U/L (ref 0–35)
AST: 20 U/L (ref 0–37)
Albumin: 4.2 g/dL (ref 3.5–5.2)
Alkaline Phosphatase: 52 U/L (ref 39–117)
BUN: 28 mg/dL — ABNORMAL HIGH (ref 6–23)
CO2: 28 mEq/L (ref 19–32)
Calcium: 9.7 mg/dL (ref 8.4–10.5)
Chloride: 103 mEq/L (ref 96–112)
Creatinine, Ser: 1.27 mg/dL — ABNORMAL HIGH (ref 0.40–1.20)
GFR: 42.62 mL/min — ABNORMAL LOW (ref >60.00)
Glucose, Bld: 134 mg/dL — ABNORMAL HIGH (ref 70–99)
Potassium: 4.6 mEq/L (ref 3.5–5.1)
Sodium: 141 mEq/L (ref 135–145)
Total Bilirubin: 0.8 mg/dL (ref 0.2–1.2)
Total Protein: 6.5 g/dL (ref 6.0–8.3)

## 2020-11-11 LAB — LIPID PANEL
Cholesterol: 244 mg/dL — ABNORMAL HIGH (ref 0–200)
HDL: 70.8 mg/dL (ref >39.00)
LDL Cholesterol: 151 mg/dL — ABNORMAL HIGH (ref 0–99)
NonHDL: 172.97
Total CHOL/HDL Ratio: 3
Triglycerides: 112 mg/dL (ref 0.0–149.0)
VLDL: 22.4 mg/dL (ref 0.0–40.0)

## 2020-11-11 LAB — CBC
HCT: 36.5 % (ref 36.0–46.0)
Hemoglobin: 12.1 g/dL (ref 12.0–15.0)
MCHC: 33.2 g/dL (ref 30.0–36.0)
MCV: 88.8 fl (ref 78.0–100.0)
Platelets: 249 10*3/uL (ref 150.0–400.0)
RBC: 4.11 Mil/uL (ref 3.87–5.11)
RDW: 14.1 % (ref 11.5–15.5)
WBC: 8 10*3/uL (ref 4.0–10.5)

## 2020-11-11 LAB — URIC ACID: Uric Acid, Serum: 6.4 mg/dL (ref 2.4–7.0)

## 2020-11-11 LAB — HEMOGLOBIN A1C: Hgb A1c MFr Bld: 7.1 % — ABNORMAL HIGH (ref 4.6–6.5)

## 2020-11-11 LAB — TSH: TSH: 2.96 u[IU]/mL (ref 0.35–4.50)

## 2020-11-12 ENCOUNTER — Other Ambulatory Visit: Payer: Self-pay

## 2020-11-12 DIAGNOSIS — E785 Hyperlipidemia, unspecified: Secondary | ICD-10-CM

## 2020-11-12 MED ORDER — ROSUVASTATIN CALCIUM 5 MG PO TABS: ORAL_TABLET | ORAL | 3 refills | Status: DC

## 2020-11-14 ENCOUNTER — Ambulatory Visit: Payer: Medicare HMO | Attending: Physician Assistant

## 2020-11-14 ENCOUNTER — Other Ambulatory Visit: Payer: Self-pay

## 2020-11-14 DIAGNOSIS — R2689 Other abnormalities of gait and mobility: Secondary | ICD-10-CM

## 2020-11-14 DIAGNOSIS — M62830 Muscle spasm of back: Secondary | ICD-10-CM | POA: Diagnosis not present

## 2020-11-14 DIAGNOSIS — M545 Low back pain, unspecified: Secondary | ICD-10-CM | POA: Insufficient documentation

## 2020-11-14 DIAGNOSIS — M25551 Pain in right hip: Secondary | ICD-10-CM | POA: Diagnosis not present

## 2020-11-14 DIAGNOSIS — G8929 Other chronic pain: Secondary | ICD-10-CM | POA: Diagnosis not present

## 2020-11-14 DIAGNOSIS — M6281 Muscle weakness (generalized): Secondary | ICD-10-CM | POA: Diagnosis not present

## 2020-11-14 DIAGNOSIS — M25651 Stiffness of right hip, not elsewhere classified: Secondary | ICD-10-CM | POA: Insufficient documentation

## 2020-11-14 NOTE — Therapy (Signed)
[place]. [place], [place], [address] Phone: [phone]   Fax:  [phone]  Physical Therapy Treatment  Patient Details  Name: Destiny Romero MRN: [id number] Date of Birth: Jan 20, 1951 Referring Provider (PT): Gerrit Halls PA-C , [place]   Encounter Date: 11/14/2020   PT End of Session - 11/14/20 1715    Visit Number 2    Number of Visits 9    Date for PT Re-Evaluation 12/25/20    PT Start Time 1700    PT Stop Time 1745    PT Time Calculation (min) 45 min    Activity Tolerance Patient tolerated treatment well;Patient limited by pain    Behavior During Therapy [facility] for tasks assessed/performed           Past Medical History:  Diagnosis Date  . Allergy   . Anal lesion 2007  . Anemia   . Anesthesia complication    Per pt/ sensitive to sedation!  . Central retinal artery occlusion 06/08/2016   limited vision right eye.  Marland Kitchen Cervical dysplasia 04/1991   Dr Rica Koyanagi - CIN I, cone and freeze in 1990s with normal paps since  . Constipation 06/08/2016   moves bowels every 3 rd day   . Decreased visual acuity 06/08/2016   Retinal bleed right eye 2017  . Depression   . Fatigue 06/27/2017  . H/O measles   . H/O mumps   . History of chicken pox   . Mitral valve prolapse    NO MR; no SBE prophylaxis needed  . PONV (postoperative nausea and vomiting)   . Pre-diabetes    highest A1c 6.4 %  . Preventative health care 12/21/2016  . Squamous cell carcinoma in situ of skin    Dr Syble Creek  . Vision impairment    limited vision in right eye.    Past Surgical History:  Procedure Laterality Date  . CERVICAL CONE BIOPSY    . COLONOSCOPY  2003  . COLONOSCOPY  04/01/2011   [place] - repeat in 5 years  . COLONOSCOPY W/ POLYPECTOMY  2007   Dr Earlean Shawl  . EYE SURGERY Bilateral    cataracts  . LAMINECTOMY     1982  . OOPHORECTOMY Left 12/2011   Dr Ubaldo Glassing ; ovarian cyst  . RECTAL SURGERY  05/28/06   pre-canerous  lesion removed in 2007; Dr Morton Stall, [facility] Perch  . rt foot morton's surgery    . TUBAL LIGATION      There were no vitals filed for this visit.   Subjective Assessment - 11/14/20 1710    Subjective Side of right hip pain better not as sensiitive 2/10 except if laying on it. Anterior R groin pain 4/10 only with movement    Pertinent History Carpal tunnel release September 2021, T2DM, mitral valve prolapse, old low back surgery, B hand arthitis    Limitations Sitting;Standing;Walking;Reading;House hold activities    How long can you sit comfortably? 1 hr    How long can you walk comfortably? Varies depending on howlong sitting, how fast going, hasnt been walking as much over past few weeks because of the pain    Diagnostic tests per pt had xrays    Patient Stated Goals to decrease pain and arthitis, to get stronger be able to move better    Currently in Pain? Yes    Pain Location Hip    Pain Orientation Right  [facility] Adult PT Treatment/Exercise - 11/14/20 1713      Lumbar Exercises: Stretches   Other Lumbar Stretch Exercise Seated Marching with Opposite Shoulder Flexion -x 10 B. Seated Hamstring Stretch30" x 2. Seated Figure 4 Piriformis Stretch 20" x 2  Standing Hip Abduction with Support 2x10 B      Lumbar Exercises: Supine   Bridge 10 reps   cues for PPT to start   Bridge with [employer] 10 reps    Other Supine Lumbar Exercises TA bracing with bent knee fallouts x5 B - no c/o R groin pain with this exercise     Knee/Hip Exercises: Stretches   ITB Stretch Right;Left;2 reps;20 seconds   Standing w forward bend     Knee/Hip Exercises: Seated   Ball Squeeze blue 10 x 2 with 3" holds              PT Short Term Goals - 10/30/20 1759      PT SHORT TERM GOAL #1   Title Independent with initial HEP    Time 2    Period Weeks    Status New    Target Date 11/14/20             PT Long Term Goals - 10/30/20 1800      PT LONG TERM GOAL #1   Title  Independent with advanced HEP    Time 8    Period Weeks    Status New    Target Date 12/26/20      PT LONG TERM GOAL #2   Title </= 2/10 pain with ADLs    Time 8    Period Weeks    Status New    Target Date 12/26/20      PT LONG TERM GOAL #3   Title Report tolerance to walking long distance and walking for prolonged time with </= 2/10 pain    Time 8    Period Weeks    Status New    Target Date 12/26/20      PT LONG TERM GOAL #4   Title improve BLE strength to at least 4+/5    Time 8    Period Weeks    Status New    Target Date 12/26/20                 Plan - 11/14/20 1718    Clinical Impression Statement Arbie Cookey tolerated todays session well. Hip FOTO completed today. Reports getting some dull type pain with piriformis stretch when leaning forward which gets better when she stops. Reverse demo'd HEP exercises well today some cues required for piriformis stretch and bridging to avoid pian exacerbating positions. ITB stretch in standing, supine lumbar stabilization with bent knee fallouts added to HEP today with good performance and no pain exacerbaiton reported in session.    Personal Factors and Comorbidities Past/Current Experience;Comorbidity 3+;Time since onset of injury/illness/exacerbation    Examination-Activity Limitations Locomotion Level;Sit;Squat;Stairs;Stand;Lift    Examination-Participation Restrictions Community Activity;Shop    Rehab Potential Good    PT Frequency 1x / week    PT Duration 8 weeks    PT Treatment/Interventions ADLs/Self Care Home Management;Cryotherapy;Electrical Stimulation;Iontophoresis 4mg /ml Dexamethasone;Moist Heat;Gait training;Neuromuscular re-education;Balance training;Therapeutic exercise;Therapeutic activities;Functional mobility training;Patient/family education;Manual techniques;Taping;Vasopneumatic Device;Energy conservation    PT Next Visit Plan Reassess HEP and progress as tolerated. Gradually progress TE with focus on endurance,  proximal core/hip strengthening, and narrow base balance as tolerated. Manual and modalities as needed    Consulted and Agree with Plan of  Care Patient           Patient will benefit from skilled therapeutic intervention in order to improve the following deficits and impairments:  Abnormal gait,Decreased range of motion,Increased muscle spasms,Pain,Decreased balance,Decreased mobility,Decreased strength,Improper body mechanics,Impaired flexibility  Visit Diagnosis: Muscle weakness (generalized)  Other abnormalities of gait and mobility  Pain in right hip  Stiffness of right hip, not elsewhere classified  Chronic low back pain, unspecified back pain laterality, unspecified whether sciatica present  Muscle spasm of back     Problem List Patient Active Problem List   Diagnosis Date Noted  . Arthritis 11/10/2020  . Bilateral hand pain 03/14/2020  . Toe pain, right 03/14/2020  . Acid reflux 11/27/2019  . SOB (shortness of breath) 11/07/2018  . Atypical chest pain 11/07/2018  . Overweight 11/07/2018  . Encounter for PPD test 06/24/2018  . Dupuytren's contracture of both hands 05/30/2018  . Acquired trigger finger of both ring fingers 05/30/2018  . Hand pain, left 05/09/2018  . Urinary frequency 06/27/2017  . Fall 06/27/2017  . Fatigue 06/27/2017  . Preventative health care 12/21/2016  . Constipation 06/08/2016  . Decreased visual acuity 06/08/2016  . History of chicken pox   . Anemia   . Controlled type 2 diabetes with retinopathy ([place]) 08/11/2013  . Osteopenia 08/19/2012  . NEOPLASM, MALIGNANT, RECTUM 06/27/2008  . Hyperlipidemia 06/27/2008  . SKIN CANCER, HX OF 06/27/2008  . History of colonic polyps 06/27/2008  . Depression with anxiety 01/19/2007    Hall Busing, PT, DPT 11/14/2020, 5:51 PM  [address]. [place], [place], [address] Phone: [phone]   Fax:  [phone]  Name: JURNEY OVERACKER MRN: [id number] Date of Birth: 18-Apr-1951

## 2020-11-21 ENCOUNTER — Ambulatory Visit: Payer: Medicare HMO

## 2020-11-21 ENCOUNTER — Other Ambulatory Visit: Payer: Self-pay

## 2020-11-21 DIAGNOSIS — M6281 Muscle weakness (generalized): Secondary | ICD-10-CM

## 2020-11-21 DIAGNOSIS — M62830 Muscle spasm of back: Secondary | ICD-10-CM | POA: Diagnosis not present

## 2020-11-21 DIAGNOSIS — M25551 Pain in right hip: Secondary | ICD-10-CM | POA: Diagnosis not present

## 2020-11-21 DIAGNOSIS — M25651 Stiffness of right hip, not elsewhere classified: Secondary | ICD-10-CM

## 2020-11-21 DIAGNOSIS — G8929 Other chronic pain: Secondary | ICD-10-CM | POA: Diagnosis not present

## 2020-11-21 DIAGNOSIS — R2689 Other abnormalities of gait and mobility: Secondary | ICD-10-CM | POA: Diagnosis not present

## 2020-11-21 DIAGNOSIS — M545 Low back pain, unspecified: Secondary | ICD-10-CM | POA: Diagnosis not present

## 2020-11-21 NOTE — Therapy (Signed)
[place]. [place], [place], [address] Phone: [phone]   Fax:  [phone]  Physical Therapy Treatment  Patient Details  Name: Destiny Romero MRN: [id number] Date of Birth: 08/31/1951 Referring Provider (PT): Gerrit Halls PA-C , [place]   Encounter Date: 11/21/2020   PT End of Session - 11/21/20 1723    Visit Number 3    Number of Visits 9    Date for PT Re-Evaluation 12/25/20    PT Start Time 1701    PT Stop Time 1746    PT Time Calculation (min) 45 min    Activity Tolerance Patient tolerated treatment well;Patient limited by pain    Behavior During Therapy [facility] for tasks assessed/performed           Past Medical History:  Diagnosis Date  . Allergy   . Anal lesion 2007  . Anemia   . Anesthesia complication    Per pt/ sensitive to sedation!  . Central retinal artery occlusion 06/08/2016   limited vision right eye.  Marland Kitchen Cervical dysplasia 04/1991   Dr Rica Koyanagi - CIN I, cone and freeze in 1990s with normal paps since  . Constipation 06/08/2016   moves bowels every 3 rd day   . Decreased visual acuity 06/08/2016   Retinal bleed right eye 2017  . Depression   . Fatigue 06/27/2017  . H/O measles   . H/O mumps   . History of chicken pox   . Mitral valve prolapse    NO MR; no SBE prophylaxis needed  . PONV (postoperative nausea and vomiting)   . Pre-diabetes    highest A1c 6.4 %  . Preventative health care 12/21/2016  . Squamous cell carcinoma in situ of skin    Dr Syble Creek  . Vision impairment    limited vision in right eye.    Past Surgical History:  Procedure Laterality Date  . CERVICAL CONE BIOPSY    . COLONOSCOPY  2003  . COLONOSCOPY  04/01/2011   [place] - repeat in 5 years  . COLONOSCOPY W/ POLYPECTOMY  2007   Dr Earlean Shawl  . EYE SURGERY Bilateral    cataracts  . LAMINECTOMY     1982  . OOPHORECTOMY Left 12/2011   Dr Ubaldo Glassing ; ovarian cyst  . RECTAL SURGERY  05/28/06   pre-canerous  lesion removed in 2007; Dr Morton Stall, [facility] Perch  . rt foot morton's surgery    . TUBAL LIGATION      There were no vitals filed for this visit.   Subjective Assessment - 11/21/20 1703    Subjective Side of hip pain has gotten much better. LBP 4-5/10 pain,  anterior groin pain 2-3/76 with certan movements possibly worse than last week - did a short distance walk the other day without her stiff shoes and that may have set off the pain    Pertinent History Carpal tunnel release September 2021, T2DM, mitral valve prolapse, old low back surgery, B hand arthitis    Limitations Sitting;Standing;Walking;Reading;House hold activities    How long can you walk comfortably? Varies depending on howlong sitting, how fast going, hasnt been walking as much over past few weeks because of the pain    Patient Stated Goals to decrease pain and arthitis, to get stronger be able to move better    Currently in Pain? Yes             [facility] Adult PT Treatment/Exercise - 11/21/20 1705      Lumbar  Exercises: Stretches   Double Knee to Chest Stretch Limitations 10" x 10 using green pball    Lower Trunk Rotation --   10 reps B with coordinated breathing   ITB Stretch Left;1 rep;20 seconds   supine with strap   Piriformis Stretch 2 reps;20 seconds;Right;Left   Supine vs seated   Other Lumbar Stretch Exercise Seated Marching with Opposite Shoulder Flexion -x 10 B. Standing Hip Abduction with Support x10 B  Standing hip ext with posterior toe taps x 10     Lumbar Exercises: Seated   Other Seated Lumbar Exercises Pelvic tilts in sitting x 10      Lumbar Exercises: Supine   Bridge with Ball Squeeze 20 reps;5 reps    Other Supine Lumbar Exercises TA bracing with bent knee fallouts x10 B,  supine marches x 10 B            PT Education - 11/21/20 1748    Education Details Updated HEP: Access Code: [email or web address]: Exercises  Standing Hip Abduction with Counter Support - 1 x daily - 5 x weekly - 2 sets - 10 reps  Seated Marching  with Opposite Shoulder Flexion - 1 x daily - 5 x weekly - 2 sets - 10 reps  Supine ITB Stretch with Strap - 1 x daily - 7 x weekly - 4 sets - 30 seconds hold  Supine Figure 4 Piriformis Stretch - 1 x daily - 7 x weekly - 4 sets - 30 seconds hold  Supine Lower Trunk Rotation - 1 x daily - 7 x weekly - 1 sets - 10 reps  Bent Knee Fallouts with Alternating Legs - 1 x daily - 7 x weekly - 2 sets - 10 reps  Supine bridge with ball or pillow squeeze - 1 x daily - 7 x weekly - 2 sets - 10 reps    Person(s) Educated Patient    Methods Explanation;Demonstration;Handout    Comprehension Verbalized understanding;Returned demonstration            PT Short Term Goals - 10/30/20 1759      PT SHORT TERM GOAL #1   Title Independent with initial HEP    Time 2    Period Weeks    Status New    Target Date 11/14/20             PT Long Term Goals - 10/30/20 1800      PT LONG TERM GOAL #1   Title Independent with advanced HEP    Time 8    Period Weeks    Status New    Target Date 12/26/20      PT LONG TERM GOAL #2   Title </= 2/10 pain with ADLs    Time 8    Period Weeks    Status New    Target Date 12/26/20      PT LONG TERM GOAL #3   Title Report tolerance to walking long distance and walking for prolonged time with </= 2/10 pain    Time 8    Period Weeks    Status New    Target Date 12/26/20      PT LONG TERM GOAL #4   Title improve BLE strength to at least 4+/5    Time 8    Period Weeks    Status New    Target Date 12/26/20                 Plan - 11/21/20 1750  Clinical Impression Statement Jiovanna tolerated todays session nicely, overall not getting too much pain exacerbation although did have some in the low back with pelvic tilts in sitting (not in supine). HEp was updated further today basedon tolerance to exercises previous with improved tolerance to supine stretching instead of seated and standing.    Personal Factors and Comorbidities Past/Current  Experience;Comorbidity 3+;Time since onset of injury/illness/exacerbation    Examination-Activity Limitations Locomotion Level;Sit;Squat;Stairs;Stand;Lift    Examination-Participation Restrictions Community Activity;Shop    Rehab Potential Good    PT Frequency 1x / week    PT Duration 8 weeks    PT Treatment/Interventions ADLs/Self Care Home Management;Cryotherapy;Electrical Stimulation;Iontophoresis 4mg /ml Dexamethasone;Moist Heat;Gait training;Neuromuscular re-education;Balance training;Therapeutic exercise;Therapeutic activities;Functional mobility training;Patient/family education;Manual techniques;Taping;Vasopneumatic Device;Energy conservation    PT Next Visit Plan Reassess HEP and progress as tolerated. Gradually progress TE with focus on endurance, proximal core/hip strengthening, and narrow base balance as tolerated. Manual and modalities as needed    PT Home Exercise Plan see pt edu    Consulted and Agree with Plan of Care Patient           Patient will benefit from skilled therapeutic intervention in order to improve the following deficits and impairments:  Abnormal gait,Decreased range of motion,Increased muscle spasms,Pain,Decreased balance,Decreased mobility,Decreased strength,Improper body mechanics,Impaired flexibility  Visit Diagnosis: Muscle weakness (generalized)  Other abnormalities of gait and mobility  Pain in right hip  Stiffness of right hip, not elsewhere classified  Chronic low back pain, unspecified back pain laterality, unspecified whether sciatica present  Muscle spasm of back     Problem List Patient Active Problem List   Diagnosis Date Noted  . Arthritis 11/10/2020  . Bilateral hand pain 03/14/2020  . Toe pain, right 03/14/2020  . Acid reflux 11/27/2019  . SOB (shortness of breath) 11/07/2018  . Atypical chest pain 11/07/2018  . Overweight 11/07/2018  . Encounter for PPD test 06/24/2018  . Dupuytren's contracture of both hands 05/30/2018  .  Acquired trigger finger of both ring fingers 05/30/2018  . Hand pain, left 05/09/2018  . Urinary frequency 06/27/2017  . Fall 06/27/2017  . Fatigue 06/27/2017  . Preventative health care 12/21/2016  . Constipation 06/08/2016  . Decreased visual acuity 06/08/2016  . History of chicken pox   . Anemia   . Controlled type 2 diabetes with retinopathy ([place]) 08/11/2013  . Osteopenia 08/19/2012  . NEOPLASM, MALIGNANT, RECTUM 06/27/2008  . Hyperlipidemia 06/27/2008  . SKIN CANCER, HX OF 06/27/2008  . History of colonic polyps 06/27/2008  . Depression with anxiety 01/19/2007    Hall Busing, PT, DPT 11/21/2020, 5:52 PM  [place]. [place], [place], [address] Phone: [phone]   Fax:  [phone]  Name: NIKYA BUSLER MRN: [id number] Date of Birth: [birth year]

## 2020-11-26 DIAGNOSIS — Z6830 Body mass index (BMI) 30.0-30.9, adult: Secondary | ICD-10-CM | POA: Diagnosis not present

## 2020-11-26 DIAGNOSIS — E669 Obesity, unspecified: Secondary | ICD-10-CM | POA: Diagnosis not present

## 2020-11-26 DIAGNOSIS — M7989 Other specified soft tissue disorders: Secondary | ICD-10-CM | POA: Diagnosis not present

## 2020-11-26 DIAGNOSIS — M2550 Pain in unspecified joint: Secondary | ICD-10-CM | POA: Diagnosis not present

## 2020-11-28 ENCOUNTER — Ambulatory Visit: Payer: Medicare HMO

## 2020-11-28 DIAGNOSIS — M25551 Pain in right hip: Secondary | ICD-10-CM | POA: Diagnosis not present

## 2020-12-02 DIAGNOSIS — M2021 Hallux rigidus, right foot: Secondary | ICD-10-CM | POA: Diagnosis not present

## 2020-12-02 DIAGNOSIS — M2022 Hallux rigidus, left foot: Secondary | ICD-10-CM | POA: Diagnosis not present

## 2020-12-05 ENCOUNTER — Other Ambulatory Visit: Payer: Self-pay

## 2020-12-05 ENCOUNTER — Ambulatory Visit: Payer: Medicare HMO

## 2020-12-05 DIAGNOSIS — G8929 Other chronic pain: Secondary | ICD-10-CM

## 2020-12-05 DIAGNOSIS — M25551 Pain in right hip: Secondary | ICD-10-CM | POA: Diagnosis not present

## 2020-12-05 DIAGNOSIS — M25651 Stiffness of right hip, not elsewhere classified: Secondary | ICD-10-CM | POA: Diagnosis not present

## 2020-12-05 DIAGNOSIS — M6281 Muscle weakness (generalized): Secondary | ICD-10-CM | POA: Diagnosis not present

## 2020-12-05 DIAGNOSIS — R2689 Other abnormalities of gait and mobility: Secondary | ICD-10-CM | POA: Diagnosis not present

## 2020-12-05 DIAGNOSIS — M62830 Muscle spasm of back: Secondary | ICD-10-CM

## 2020-12-05 DIAGNOSIS — M545 Low back pain, unspecified: Secondary | ICD-10-CM | POA: Diagnosis not present

## 2020-12-05 NOTE — Therapy (Signed)
[place]. [place], [place], [address] Phone: [phone]   Fax:  [phone]  Physical Therapy Treatment  Patient Details  Name: Destiny Romero MRN: [id number] Date of Birth: 01-07-1951 Referring Provider (PT): Gerrit Halls PA-C , [place]   Encounter Date: 12/05/2020   PT End of Session - 12/05/20 1721    Visit Number 4    Number of Visits 9    Date for PT Re-Evaluation 12/25/20    PT Start Time 0500    PT Stop Time 0545    PT Time Calculation (min) 45 min    Activity Tolerance Patient tolerated treatment well;Patient limited by pain    Behavior During Therapy [facility] for tasks assessed/performed           Past Medical History:  Diagnosis Date  . Allergy   . Anal lesion 2007  . Anemia   . Anesthesia complication    Per pt/ sensitive to sedation!  . Central retinal artery occlusion 06/08/2016   limited vision right eye.  Marland Kitchen Cervical dysplasia 04/1991   Dr Rica Koyanagi - CIN I, cone and freeze in 1990s with normal paps since  . Constipation 06/08/2016   moves bowels every 3 rd day   . Decreased visual acuity 06/08/2016   Retinal bleed right eye 2017  . Depression   . Fatigue 06/27/2017  . H/O measles   . H/O mumps   . History of chicken pox   . Mitral valve prolapse    NO MR; no SBE prophylaxis needed  . PONV (postoperative nausea and vomiting)   . Pre-diabetes    highest A1c 6.4 %  . Preventative health care 12/21/2016  . Squamous cell carcinoma in situ of skin    Dr Syble Creek  . Vision impairment    limited vision in right eye.    Past Surgical History:  Procedure Laterality Date  . CERVICAL CONE BIOPSY    . COLONOSCOPY  2003  . COLONOSCOPY  04/01/2011   [place] - repeat in 5 years  . COLONOSCOPY W/ POLYPECTOMY  2007   Dr Earlean Shawl  . EYE SURGERY Bilateral    cataracts  . LAMINECTOMY     1982  . OOPHORECTOMY Left 12/2011   Dr Ubaldo Glassing ; ovarian cyst  . RECTAL SURGERY  05/28/06   pre-canerous  lesion removed in 2007; Dr Morton Stall, [facility] Perch  . rt foot morton's surgery    . TUBAL LIGATION      There were no vitals filed for this visit.   Subjective Assessment - 12/05/20 1702    Subjective Side of hip pain has gotten much better. LBP fels increased especially when extending  anterior groin pain 2-8/78 with certan movements. Is doing a bit more strolls. feels a catch in the anterior and posterio hip/LB with ER. Saw foot Dr and was given a foot plate insert for the shoe - seems that right foot pain is also playing a role in hip and back pain since its causing her to move differnet.    Pertinent History Carpal tunnel release September 2021, T2DM, mitral valve prolapse, old low back surgery, B hand arthitis             [facility] Adult PT Treatment/Exercise - 12/05/20 0001      Lumbar Exercises: Stretches   Pelvic Tilt --   10 reps A-P, 10 reps M-L within pain limited ranges. seated on dynadisc   Piriformis Stretch 2 reps;20 seconds;Right;Left    Other  Lumbar Stretch Exercise Seated Marching x 10 B, x 10 with alt UE/LE seated on dynadisc.    Other Lumbar Stretch Exercise toe extension with gentle distraction self stretch on the right      Lumbar Exercises: Machines for Strengthening   Cybex Lumbar Extension lat pull down 10# x 5, 15# x10 cues for core engagement and controlled motion      Lumbar Exercises: Supine   Bridge with Ball Squeeze 20 reps    Other Supine Lumbar Exercises TA bracing with bent knee fallouts x10 B, supine marches 2 x 10 B                    PT Short Term Goals - 10/30/20 1759      PT SHORT TERM GOAL #1   Title Independent with initial HEP    Time 2    Period Weeks    Status New    Target Date 11/14/20             PT Long Term Goals - 10/30/20 1800      PT LONG TERM GOAL #1   Title Independent with advanced HEP    Time 8    Period Weeks    Status New    Target Date 12/26/20      PT LONG TERM GOAL #2   Title </= 2/10 pain with ADLs     Time 8    Period Weeks    Status New    Target Date 12/26/20      PT LONG TERM GOAL #3   Title Report tolerance to walking long distance and walking for prolonged time with </= 2/10 pain    Time 8    Period Weeks    Status New    Target Date 12/26/20      PT LONG TERM GOAL #4   Title improve BLE strength to at least 4+/5    Time 8    Period Weeks    Status New    Target Date 12/26/20                 Plan - 12/05/20 1721    Clinical Impression Statement Toleratd todays session well. Start of session taking a look at the right foot and she demonstrates minimal hallux extension on the right with very limited push off on that side due to pain. Educated on gentle toe stretches to work on mobility. Continued hip and lumbar stability exercises with good tolerance but she continues to get some intermittent catches in the right low back/hip especially with quick extenson movements or hip ER. Incorporated some lat strength with core engagement today to work on further back strengthening with good tolerance. Is following up with MD monday - advised her to mention this to MD    Personal Factors and Comorbidities Past/Current Experience;Comorbidity 3+;Time since onset of injury/illness/exacerbation    Examination-Activity Limitations Locomotion Level;Sit;Squat;Stairs;Stand;Lift    Examination-Participation Restrictions Community Activity;Shop    Rehab Potential Good    PT Frequency 1x / week    PT Duration 8 weeks    PT Treatment/Interventions ADLs/Self Care Home Management;Cryotherapy;Electrical Stimulation;Iontophoresis 4mg /ml Dexamethasone;Moist Heat;Gait training;Neuromuscular re-education;Balance training;Therapeutic exercise;Therapeutic activities;Functional mobility training;Patient/family education;Manual techniques;Taping;Vasopneumatic Device;Energy conservation    PT Next Visit Plan Reassess HEP and progress as tolerated. Gradually progress TE with focus on endurance, proximal  core/hip strengthening, and narrow base balance as tolerated. Manual and modalities as needed    Consulted and Agree with Plan of Care Patient  Patient will benefit from skilled therapeutic intervention in order to improve the following deficits and impairments:  Abnormal gait,Decreased range of motion,Increased muscle spasms,Pain,Decreased balance,Decreased mobility,Decreased strength,Improper body mechanics,Impaired flexibility  Visit Diagnosis: Muscle weakness (generalized)  Other abnormalities of gait and mobility  Pain in right hip  Stiffness of right hip, not elsewhere classified  Chronic low back pain, unspecified back pain laterality, unspecified whether sciatica present  Muscle spasm of back     Problem List Patient Active Problem List   Diagnosis Date Noted  . Arthritis 11/10/2020  . Bilateral hand pain 03/14/2020  . Toe pain, right 03/14/2020  . Acid reflux 11/27/2019  . SOB (shortness of breath) 11/07/2018  . Atypical chest pain 11/07/2018  . Overweight 11/07/2018  . Encounter for PPD test 06/24/2018  . Dupuytren's contracture of both hands 05/30/2018  . Acquired trigger finger of both ring fingers 05/30/2018  . Hand pain, left 05/09/2018  . Urinary frequency 06/27/2017  . Fall 06/27/2017  . Fatigue 06/27/2017  . Preventative health care 12/21/2016  . Constipation 06/08/2016  . Decreased visual acuity 06/08/2016  . History of chicken pox   . Anemia   . Controlled type 2 diabetes with retinopathy ([place]) 08/11/2013  . Osteopenia 08/19/2012  . NEOPLASM, MALIGNANT, RECTUM 06/27/2008  . Hyperlipidemia 06/27/2008  . SKIN CANCER, HX OF 06/27/2008  . History of colonic polyps 06/27/2008  . Depression with anxiety 01/19/2007    Hall Busing, PT, DPT 12/05/2020, 5:54 PM  [place]. [place], [place], [address] Phone: [phone]   Fax:  [phone]  Name: Destiny Romero MRN: [id number] Date of Birth: [birth year]

## 2020-12-09 DIAGNOSIS — M25551 Pain in right hip: Secondary | ICD-10-CM | POA: Diagnosis not present

## 2020-12-09 DIAGNOSIS — M5459 Other low back pain: Secondary | ICD-10-CM | POA: Diagnosis not present

## 2020-12-12 ENCOUNTER — Encounter: Payer: Self-pay | Admitting: Physical Therapy

## 2020-12-12 ENCOUNTER — Other Ambulatory Visit: Payer: Self-pay

## 2020-12-12 ENCOUNTER — Ambulatory Visit: Payer: Medicare HMO | Attending: Physician Assistant | Admitting: Physical Therapy

## 2020-12-12 ENCOUNTER — Other Ambulatory Visit (INDEPENDENT_AMBULATORY_CARE_PROVIDER_SITE_OTHER): Payer: Medicare HMO

## 2020-12-12 DIAGNOSIS — R2689 Other abnormalities of gait and mobility: Secondary | ICD-10-CM | POA: Diagnosis not present

## 2020-12-12 DIAGNOSIS — N289 Disorder of kidney and ureter, unspecified: Secondary | ICD-10-CM

## 2020-12-12 DIAGNOSIS — G8929 Other chronic pain: Secondary | ICD-10-CM | POA: Insufficient documentation

## 2020-12-12 DIAGNOSIS — M545 Low back pain, unspecified: Secondary | ICD-10-CM | POA: Insufficient documentation

## 2020-12-12 DIAGNOSIS — M6281 Muscle weakness (generalized): Secondary | ICD-10-CM | POA: Insufficient documentation

## 2020-12-12 DIAGNOSIS — M25551 Pain in right hip: Secondary | ICD-10-CM | POA: Insufficient documentation

## 2020-12-12 DIAGNOSIS — M62830 Muscle spasm of back: Secondary | ICD-10-CM | POA: Diagnosis not present

## 2020-12-12 DIAGNOSIS — M25651 Stiffness of right hip, not elsewhere classified: Secondary | ICD-10-CM | POA: Diagnosis not present

## 2020-12-12 LAB — COMPREHENSIVE METABOLIC PANEL
ALT: 51 U/L — ABNORMAL HIGH (ref 0–35)
AST: 33 U/L (ref 0–37)
Albumin: 4.4 g/dL (ref 3.5–5.2)
Alkaline Phosphatase: 56 U/L (ref 39–117)
BUN: 19 mg/dL (ref 6–23)
CO2: 30 mEq/L (ref 19–32)
Calcium: 9.8 mg/dL (ref 8.4–10.5)
Chloride: 103 mEq/L (ref 96–112)
Creatinine, Ser: 1.12 mg/dL (ref 0.40–1.20)
GFR: 49.53 mL/min — ABNORMAL LOW (ref >60.00)
Glucose, Bld: 112 mg/dL — ABNORMAL HIGH (ref 70–99)
Potassium: 4.5 mEq/L (ref 3.5–5.1)
Sodium: 141 mEq/L (ref 135–145)
Total Bilirubin: 0.9 mg/dL (ref 0.2–1.2)
Total Protein: 6.8 g/dL (ref 6.0–8.3)

## 2020-12-12 NOTE — Therapy (Signed)
[place] [place]. [place], [place], [address] Phone: [phone]   Fax:  [phone]  Physical Therapy Treatment  Patient Details  Name: Destiny Romero MRN: [id number] Date of Birth: 04/07/1951 Referring Provider (PT): Gerrit Halls PA-C , [place]   Encounter Date: 12/12/2020   PT End of Session - 12/12/20 1737    Visit Number 5    Number of Visits 9    Date for PT Re-Evaluation 12/25/20    PT Start Time 4235    PT Stop Time 1740    PT Time Calculation (min) 42 min    Activity Tolerance Patient tolerated treatment well;Patient limited by pain    Behavior During Therapy [facility] for tasks assessed/performed           Past Medical History:  Diagnosis Date  . Allergy   . Anal lesion 2007  . Anemia   . Anesthesia complication    Per pt/ sensitive to sedation!  . Central retinal artery occlusion 06/08/2016   limited vision right eye.  Marland Kitchen Cervical dysplasia 04/1991   Dr Rica Koyanagi - CIN I, cone and freeze in 1990s with normal paps since  . Constipation 06/08/2016   moves bowels every 3 rd day   . Decreased visual acuity 06/08/2016   Retinal bleed right eye 2017  . Depression   . Fatigue 06/27/2017  . H/O measles   . H/O mumps   . History of chicken pox   . Mitral valve prolapse    NO MR; no SBE prophylaxis needed  . PONV (postoperative nausea and vomiting)   . Pre-diabetes    highest A1c 6.4 %  . Preventative health care 12/21/2016  . Squamous cell carcinoma in situ of skin    Dr Syble Creek  . Vision impairment    limited vision in right eye.    Past Surgical History:  Procedure Laterality Date  . CERVICAL CONE BIOPSY    . COLONOSCOPY  2003  . COLONOSCOPY  04/01/2011   [place] - repeat in 5 years  . COLONOSCOPY W/ POLYPECTOMY  2007   Dr Earlean Shawl  . EYE SURGERY Bilateral    cataracts  . LAMINECTOMY     1982  . OOPHORECTOMY Left 12/2011   Dr Ubaldo Glassing ; ovarian cyst  . RECTAL SURGERY  05/28/06   pre-canerous  lesion removed in 2007; Dr Morton Stall, [facility]  . rt foot morton's surgery    . TUBAL LIGATION      There were no vitals filed for this visit.   Subjective Assessment - 12/12/20 1707    Subjective Pt reports she saw MD and received dx of DDD in lumbar spine.    Currently in Pain? Yes    Pain Score 3     Pain Location Hip    Pain Orientation Right                             [facility] Adult PT Treatment/Exercise - 12/12/20 0001      Lumbar Exercises: Stretches   Active Hamstring Stretch Right;Left;1 rep;20 seconds    Piriformis Stretch 2 reps;20 seconds;Right;Left    Piriformis Stretch Limitations seated      Lumbar Exercises: Aerobic   UBE (Upper Arm Bike) L 1.5 x 2 min each    Nustep L5 x 6 min      Lumbar Exercises: Machines for Strengthening   Other Lumbar Machine Exercise rows and lats 15# 2x10  Lumbar Exercises: Seated   Other Seated Lumbar Exercises iso abs x15 with 3 sec hold    Other Seated Lumbar Exercises seated hip abd green tb 2x15; seated fwd/lat flexion stretch with exercise ball x3 each direction                    PT Short Term Goals - 10/30/20 1759      PT SHORT TERM GOAL #1   Title Independent with initial HEP    Time 2    Period Weeks    Status New    Target Date 11/14/20             PT Long Term Goals - 10/30/20 1800      PT LONG TERM GOAL #1   Title Independent with advanced HEP    Time 8    Period Weeks    Status New    Target Date 12/26/20      PT LONG TERM GOAL #2   Title </= 2/10 pain with ADLs    Time 8    Period Weeks    Status New    Target Date 12/26/20      PT LONG TERM GOAL #3   Title Report tolerance to walking long distance and walking for prolonged time with </= 2/10 pain    Time 8    Period Weeks    Status New    Target Date 12/26/20      PT LONG TERM GOAL #4   Title improve BLE strength to at least 4+/5    Time 8    Period Weeks    Status New    Target Date 12/26/20                  Plan - 12/12/20 1737    Clinical Impression Statement Gentle progression to machine interventions this rx. Pt hesitant with new ex's d/t fear of worsening of LBP. Cues for form with seated rows/pulldowns. Pt reporting relief with lumbar flexion. Continue to progress lumbar stab/flexibility to tolerance.    PT Treatment/Interventions ADLs/Self Care Home Management;Cryotherapy;Electrical Stimulation;Iontophoresis 4mg /ml Dexamethasone;Moist Heat;Gait training;Neuromuscular re-education;Balance training;Therapeutic exercise;Therapeutic activities;Functional mobility training;Patient/family education;Manual techniques;Taping;Vasopneumatic Device;Energy conservation    PT Next Visit Plan Reassess HEP and progress as tolerated. Gradually progress TE with focus on endurance, proximal core/hip strengthening, and narrow base balance as tolerated. Manual and modalities as needed    Consulted and Agree with Plan of Care Patient           Patient will benefit from skilled therapeutic intervention in order to improve the following deficits and impairments:  Abnormal gait,Decreased range of motion,Increased muscle spasms,Pain,Decreased balance,Decreased mobility,Decreased strength,Improper body mechanics,Impaired flexibility  Visit Diagnosis: Muscle weakness (generalized)  Other abnormalities of gait and mobility  Pain in right hip  Stiffness of right hip, not elsewhere classified  Chronic low back pain, unspecified back pain laterality, unspecified whether sciatica present  Muscle spasm of back     Problem List Patient Active Problem List   Diagnosis Date Noted  . Arthritis 11/10/2020  . Bilateral hand pain 03/14/2020  . Toe pain, right 03/14/2020  . Acid reflux 11/27/2019  . SOB (shortness of breath) 11/07/2018  . Atypical chest pain 11/07/2018  . Overweight 11/07/2018  . Encounter for PPD test 06/24/2018  . Dupuytren's contracture of both hands 05/30/2018  . Acquired  trigger finger of both ring fingers 05/30/2018  . Hand pain, left 05/09/2018  . Urinary frequency 06/27/2017  . Fall 06/27/2017  .  Fatigue 06/27/2017  . Preventative health care 12/21/2016  . Constipation 06/08/2016  . Decreased visual acuity 06/08/2016  . History of chicken pox   . Anemia   . Controlled type 2 diabetes with retinopathy ([place]) 08/11/2013  . Osteopenia 08/19/2012  . NEOPLASM, MALIGNANT, RECTUM 06/27/2008  . Hyperlipidemia 06/27/2008  . SKIN CANCER, HX OF 06/27/2008  . History of colonic polyps 06/27/2008  . Depression with anxiety 01/19/2007   Amador Cunas, PT, DPT Donald Prose Antoinette Haskett 12/12/2020, 5:39 PM  [address]. [place], [place], [address] Phone: [phone]   Fax:  [phone]  Name: Destiny Romero MRN: [id number] Date of Birth: [birth year]

## 2020-12-19 ENCOUNTER — Ambulatory Visit: Payer: Medicare HMO | Admitting: Physical Therapy

## 2020-12-21 ENCOUNTER — Other Ambulatory Visit: Payer: Self-pay | Admitting: Family Medicine

## 2020-12-26 ENCOUNTER — Ambulatory Visit: Payer: Medicare HMO

## 2020-12-31 DIAGNOSIS — M545 Low back pain, unspecified: Secondary | ICD-10-CM | POA: Diagnosis not present

## 2021-01-06 ENCOUNTER — Encounter: Payer: Self-pay | Admitting: Family Medicine

## 2021-01-06 DIAGNOSIS — R69 Illness, unspecified: Secondary | ICD-10-CM | POA: Diagnosis not present

## 2021-01-06 DIAGNOSIS — F4310 Post-traumatic stress disorder, unspecified: Secondary | ICD-10-CM | POA: Diagnosis not present

## 2021-01-17 DIAGNOSIS — M5459 Other low back pain: Secondary | ICD-10-CM | POA: Diagnosis not present

## 2021-01-20 ENCOUNTER — Other Ambulatory Visit: Payer: Self-pay | Admitting: Family Medicine

## 2021-01-22 DIAGNOSIS — M5416 Radiculopathy, lumbar region: Secondary | ICD-10-CM | POA: Insufficient documentation

## 2021-01-30 ENCOUNTER — Other Ambulatory Visit: Payer: Self-pay

## 2021-01-30 ENCOUNTER — Ambulatory Visit: Payer: Medicare HMO | Attending: Specialist | Admitting: Rehabilitative and Restorative Service Providers"

## 2021-01-30 ENCOUNTER — Encounter: Payer: Self-pay | Admitting: Rehabilitative and Restorative Service Providers"

## 2021-01-30 DIAGNOSIS — M25551 Pain in right hip: Secondary | ICD-10-CM | POA: Diagnosis present

## 2021-01-30 DIAGNOSIS — M25651 Stiffness of right hip, not elsewhere classified: Secondary | ICD-10-CM | POA: Insufficient documentation

## 2021-01-30 DIAGNOSIS — M6281 Muscle weakness (generalized): Secondary | ICD-10-CM | POA: Diagnosis present

## 2021-01-30 DIAGNOSIS — M62830 Muscle spasm of back: Secondary | ICD-10-CM

## 2021-01-30 DIAGNOSIS — G8929 Other chronic pain: Secondary | ICD-10-CM

## 2021-01-30 DIAGNOSIS — M545 Low back pain, unspecified: Secondary | ICD-10-CM | POA: Diagnosis present

## 2021-01-30 DIAGNOSIS — R2689 Other abnormalities of gait and mobility: Secondary | ICD-10-CM

## 2021-01-30 NOTE — Patient Instructions (Signed)
Updated HEP: Access Code: [email or web address]: Exercises Standing Hip Abduction with Counter Support - 1 x daily - 5 x weekly - 2 sets - 10 reps Seated Marching with Opposite Shoulder Flexion - 1 x daily - 5 x weekly - 2 sets - 10 reps Supine ITB Stretch with Strap - 1 x daily - 7 x weekly - 4 sets - 30 seconds hold Supine Figure 4 Piriformis Stretch - 1 x daily - 7 x weekly - 4 sets - 30 seconds hold Supine Lower Trunk Rotation - 1 x daily - 7 x weekly - 1 sets - 10 reps Bent Knee Fallouts with Alternating Legs - 1 x daily - 7 x weekly - 2 sets - 10 reps Supine bridge with ball or pillow squeeze - 1 x daily - 7 x weekly - 2 sets - 10 reps

## 2021-01-30 NOTE — Therapy (Signed)
[place]. [place], [place], [address] Phone: [phone]   Fax:  [phone]  Physical Therapy Evaluation  Patient Details  Name: Destiny Romero MRN: [id number] Date of Birth: 09-12-1950 Referring Provider (PT): Dr Susa Day   Encounter Date: 01/30/2021   PT End of Session - 01/30/21 1009    Visit Number 1    Date for PT Re-Evaluation 03/21/21    PT Start Time 0925    PT Stop Time 1010    PT Time Calculation (min) 45 min    Activity Tolerance Patient tolerated treatment well    Behavior During Therapy [facility] for tasks assessed/performed           Past Medical History:  Diagnosis Date  . Allergy   . Anal lesion 2007  . Anemia   . Anesthesia complication    Per pt/ sensitive to sedation!  . Central retinal artery occlusion 06/08/2016   limited vision right eye.  Marland Kitchen Cervical dysplasia 04/1991   Dr Rica Koyanagi - CIN I, cone and freeze in 1990s with normal paps since  . Constipation 06/08/2016   moves bowels every 3 rd day   . Decreased visual acuity 06/08/2016   Retinal bleed right eye 2017  . Depression   . Fatigue 06/27/2017  . H/O measles   . H/O mumps   . History of chicken pox   . Mitral valve prolapse    NO MR; no SBE prophylaxis needed  . PONV (postoperative nausea and vomiting)   . Pre-diabetes    highest A1c 6.4 %  . Preventative health care 12/21/2016  . Squamous cell carcinoma in situ of skin    Dr Syble Creek  . Vision impairment    limited vision in right eye.    Past Surgical History:  Procedure Laterality Date  . CERVICAL CONE BIOPSY    . COLONOSCOPY  2003  . COLONOSCOPY  04/01/2011   [place] - repeat in 5 years  . COLONOSCOPY W/ POLYPECTOMY  2007   Dr Earlean Shawl  . EYE SURGERY Bilateral    cataracts  . LAMINECTOMY     1982  . OOPHORECTOMY Left 12/2011   Dr Ubaldo Glassing ; ovarian cyst  . RECTAL SURGERY  05/28/06   pre-canerous lesion removed in 2007; Dr Morton Stall, [facility]  . rt foot morton's  surgery    . TUBAL LIGATION      There were no vitals filed for this visit.    Subjective Assessment - 01/30/21 0930    Subjective Pt reports that she thought initially that she was having R hip pain and came to therapy at that time.  However, she was still having the pain, and her MD thought that it could be her back.  She went to see Dr Tonita Cong and he diagnosed her with stenosis and has he ordered PT and patient will receive a spinal injection.    Pertinent History Carpal tunnel release September 2021, T2DM, mitral valve prolapse, old low back surgery, B hand arthitis    Limitations Sitting;Standing;Walking;Reading;House hold activities    How long can you sit comfortably? a little over an hour    Diagnostic tests MRI for back that revealed stenosis in lumbar spine    Patient Stated Goals I want to be able to resume my walking and go back to the gym.    Currently in Pain? Yes    Pain Score 3     Pain Location Back    Pain Orientation  Lower;Right    Pain Descriptors / Indicators Aching;Dull;Tender    Pain Type Chronic pain    Pain Radiating Towards R hip    Pain Onset More than a month ago    Pain Frequency Constant    Aggravating Factors  walking downhill and pivoting on R leg    Pain Relieving Factors heating pad, CBD topical cream              [facility] PT Assessment - 01/30/21 0001      Assessment   Medical Diagnosis Lumbar Spinal Stenosis    Referring Provider (PT) Dr Susa Day    Hand Dominance Right    Next MD Visit 02/11/21   steroid injection   Prior Therapy yes for R hip      Precautions   Precautions None      Restrictions   Weight Bearing Restrictions No      Balance Screen   Has the patient fallen in the past 6 months No    Has the patient had a decrease in activity level because of a fear of falling?  No    Is the patient reluctant to leave their home because of a fear of falling?  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[employer], playing piano, Scientific laboratory technician   Overall Cognitive Status Within Functional Limits for tasks assessed      Observation/Other Assessments   Focus on Therapeutic Outcomes (FOTO)  49%, risk adjusted 50%      ROM / Strength   AROM / PROM / Strength Strength      Strength   Right Hip Flexion 4-/5    Right Hip Extension 3+/5    Right Hip ABduction 4-/5    Left Hip Flexion 4-/5    Left Hip Extension 4-/5    Left Hip ABduction 4-/5      Flexibility   Hamstrings some tightness noted B    Quadriceps some tightness noted B      Transfers   Five time sit to stand comments  9.27 sec      Standardized Balance Assessment   Standardized Balance Assessment Berg Balance Test      Berg Balance Test   Sit to Stand Able to stand without using hands and stabilize independently    Standing Unsupported Able to stand safely 2 minutes    Sitting with Back Unsupported but Feet Supported on Floor or Stool Able to sit safely and securely 2 minutes    Stand to Sit Sits safely with minimal use of hands    Transfers Able to transfer safely, minor use of hands    Standing Unsupported with Eyes Closed Able to stand 10 seconds safely    Standing Unsupported with Feet Together Able to place feet together independently and stand 1 minute safely    From Standing, Reach Forward with Outstretched Arm Can reach forward >12 cm safely (5")    From Standing Position, Pick up Object from Floor Able to pick up shoe safely and easily  From Standing Position, Turn to Look Behind Over each Shoulder Looks behind one side only/other side shows less weight shift    Turn 360 Degrees Able to turn 360 degrees safely in 4  seconds or less    Standing Unsupported, Alternately Place Feet on Step/Stool Able to stand independently and safely and complete 8 steps in 20 seconds    Standing Unsupported, One Foot in Front Able to plae foot ahead of the other independently and hold 30 seconds    Standing on One Leg Able to lift leg independently and hold > 10 seconds    Total Score 53                      Objective measurements completed on examination: See above findings.       [facility] Adult PT Treatment/Exercise - 01/30/21 0001      Lumbar Exercises: Aerobic   Recumbent Bike L2.0, x6 min                    PT Short Term Goals - 01/30/21 1021      PT SHORT TERM GOAL #1   Title Independent with initial HEP    Time 2    Period Weeks    Status New             PT Long Term Goals - 01/30/21 1022      PT LONG TERM GOAL #1   Title Independent with advanced HEP    Time 8    Period Weeks    Status New      PT LONG TERM GOAL #2   Title </= 2/10 pain with ADLs    Time 8    Period Weeks    Status New      PT LONG TERM GOAL #3   Title Report tolerance to walking long distance and walking for prolonged time with </= 2/10 pain    Time 8    Period Weeks    Status New      PT LONG TERM GOAL #4   Title improve BLE strength to at least 4+ to 5/5    Time 8    Period Weeks    Status New                  Plan - 01/30/21 1011    Clinical Impression Statement Patient presents with a new referral for PT from Dr Tonita Cong secondary to lumbar stenosis.  She reports that she does have some pain radiating down to her R hip and she is hoping that PT can help strengthen her core and decrease her pain.  She presents with some tightness noted of hamstrings and bilat hip weakness, as well as decreased core strength.  She is limited with pain in ambulation and going to the gym.  She would benefit from skilled PT to address her functional impairments to allow her to be stronger and return to  her prior walking level of 2+ miles.    Personal Factors and Comorbidities Past/Current Experience;Comorbidity 3+;Time since onset of injury/illness/exacerbation    Examination-Activity Limitations Locomotion Level;Sit;Squat;Stairs;Stand;Lift    Examination-Participation Restrictions Community Activity;Shop    Stability/Clinical Decision Making Evolving/Moderate complexity    Clinical Decision Making Moderate    Rehab Potential Good    PT Frequency 2x / week    PT Duration 8 weeks    PT Treatment/Interventions ADLs/Self Care Home Management;Cryotherapy;Electrical Stimulation;Iontophoresis 4mg /ml Dexamethasone;Moist Heat;Gait training;Neuromuscular re-education;Balance training;Therapeutic exercise;Therapeutic activities;Functional  mobility training;Patient/family education;Manual techniques;Taping;Vasopneumatic Device;Energy conservation;Dry needling;Spinal Manipulations;Joint Manipulations;Stair training;Traction    PT Next Visit Plan Reassess HEP and progress as tolerated. Gradually progress TE with focus on endurance, proximal core/hip strengthening, and narrow base balance as tolerated. Manual and modalities as needed    PT Home Exercise Plan continue previous HEP    Consulted and Agree with Plan of Care Patient           Patient will benefit from skilled therapeutic intervention in order to improve the following deficits and impairments:  Abnormal gait,Decreased range of motion,Increased muscle spasms,Pain,Decreased balance,Decreased mobility,Decreased strength,Improper body mechanics,Impaired flexibility  Visit Diagnosis: Muscle weakness (generalized) - Plan: PT plan of care cert/re-cert  Chronic low back pain, unspecified back pain laterality, unspecified whether sciatica present - Plan: PT plan of care cert/re-cert  Other abnormalities of gait and mobility - Plan: PT plan of care cert/re-cert  Muscle spasm of back - Plan: PT plan of care cert/re-cert  Pain in right hip - Plan: PT  plan of care cert/re-cert  Stiffness of right hip, not elsewhere classified - Plan: PT plan of care cert/re-cert     Problem List Patient Active Problem List   Diagnosis Date Noted  . Arthritis 11/10/2020  . Bilateral hand pain 03/14/2020  . Toe pain, right 03/14/2020  . Acid reflux 11/27/2019  . SOB (shortness of breath) 11/07/2018  . Atypical chest pain 11/07/2018  . Overweight 11/07/2018  . Encounter for PPD test 06/24/2018  . Dupuytren's contracture of both hands 05/30/2018  . Acquired trigger finger of both ring fingers 05/30/2018  . Hand pain, left 05/09/2018  . Urinary frequency 06/27/2017  . Fall 06/27/2017  . Fatigue 06/27/2017  . Preventative health care 12/21/2016  . Constipation 06/08/2016  . Decreased visual acuity 06/08/2016  . History of chicken pox   . Anemia   . Controlled type 2 diabetes with retinopathy ([place]) 08/11/2013  . Osteopenia 08/19/2012  . NEOPLASM, MALIGNANT, RECTUM 06/27/2008  . Hyperlipidemia 06/27/2008  . SKIN CANCER, HX OF 06/27/2008  . History of colonic polyps 06/27/2008  . Depression with anxiety 01/19/2007    Juel Burrow, PT, DPT 01/30/2021, 10:28 AM  [place]. [place], [place], [address] Phone: [phone]   Fax:  [phone]  Name: DEWANNA HURSTON MRN: [id number] Date of Birth: 1951/04/08

## 2021-02-04 ENCOUNTER — Encounter: Payer: Self-pay | Admitting: Family Medicine

## 2021-02-06 ENCOUNTER — Encounter: Payer: Self-pay | Admitting: Physical Therapy

## 2021-02-06 ENCOUNTER — Other Ambulatory Visit: Payer: Self-pay

## 2021-02-06 ENCOUNTER — Ambulatory Visit: Payer: Medicare HMO | Attending: Specialist | Admitting: Physical Therapy

## 2021-02-06 DIAGNOSIS — M545 Low back pain, unspecified: Secondary | ICD-10-CM | POA: Diagnosis not present

## 2021-02-06 DIAGNOSIS — M25651 Stiffness of right hip, not elsewhere classified: Secondary | ICD-10-CM

## 2021-02-06 DIAGNOSIS — M25551 Pain in right hip: Secondary | ICD-10-CM | POA: Diagnosis not present

## 2021-02-06 DIAGNOSIS — R2689 Other abnormalities of gait and mobility: Secondary | ICD-10-CM | POA: Insufficient documentation

## 2021-02-06 DIAGNOSIS — M6281 Muscle weakness (generalized): Secondary | ICD-10-CM | POA: Diagnosis not present

## 2021-02-06 DIAGNOSIS — M62830 Muscle spasm of back: Secondary | ICD-10-CM | POA: Diagnosis not present

## 2021-02-06 DIAGNOSIS — G8929 Other chronic pain: Secondary | ICD-10-CM

## 2021-02-06 NOTE — Therapy (Signed)
[place]. [place], [place], [address] Phone: [phone]   Fax:  [phone]  Physical Therapy Treatment  Patient Details  Name: OCTA UPLINGER MRN: [id number] Date of Birth: 1951/05/05 Referring Provider (PT): Dr Susa Day   Encounter Date: 02/06/2021   PT End of Session - 02/06/21 1740    Visit Number 2    Number of Visits 9    Date for PT Re-Evaluation 03/21/21    PT Start Time 1700    PT Stop Time 1745    PT Time Calculation (min) 45 min    Activity Tolerance Patient tolerated treatment well    Behavior During Therapy [facility] for tasks assessed/performed           Past Medical History:  Diagnosis Date  . Allergy   . Anal lesion 2007  . Anemia   . Anesthesia complication    Per pt/ sensitive to sedation!  . Central retinal artery occlusion 06/08/2016   limited vision right eye.  Marland Kitchen Cervical dysplasia 04/1991   Dr Rica Koyanagi - CIN I, cone and freeze in 1990s with normal paps since  . Constipation 06/08/2016   moves bowels every 3 rd day   . Decreased visual acuity 06/08/2016   Retinal bleed right eye 2017  . Depression   . Fatigue 06/27/2017  . H/O measles   . H/O mumps   . History of chicken pox   . Mitral valve prolapse    NO MR; no SBE prophylaxis needed  . PONV (postoperative nausea and vomiting)   . Pre-diabetes    highest A1c 6.4 %  . Preventative health care 12/21/2016  . Squamous cell carcinoma in situ of skin    Dr Syble Creek  . Vision impairment    limited vision in right eye.    Past Surgical History:  Procedure Laterality Date  . CERVICAL CONE BIOPSY    . COLONOSCOPY  2003  . COLONOSCOPY  04/01/2011   [place] - repeat in 5 years  . COLONOSCOPY W/ POLYPECTOMY  2007   Dr Earlean Shawl  . EYE SURGERY Bilateral    cataracts  . LAMINECTOMY     1982  . OOPHORECTOMY Left 12/2011   Dr Ubaldo Glassing ; ovarian cyst  . RECTAL SURGERY  05/28/06   pre-canerous lesion removed in 2007; Dr Morton Stall, [facility]  .  rt foot morton's surgery    . TUBAL LIGATION      There were no vitals filed for this visit.   Subjective Assessment - 02/06/21 1703    Subjective Pt states that she is feeling okay today with mild LBP. Pt has f/u for injection Tuesday, 6/7.    Currently in Pain? Yes    Pain Score 2     Pain Location Back                             [facility] Adult PT Treatment/Exercise - 02/06/21 0001      Lumbar Exercises: Stretches   Active Hamstring Stretch Right;Left;1 rep;20 seconds    Piriformis Stretch 2 reps;20 seconds;Right;Left    Piriformis Stretch Limitations seated    Other Lumbar Stretch Exercise seated fwd/lat flexion x5 5 sec hold      Lumbar Exercises: Aerobic   Recumbent Bike L2 x 5 min    Nustep L5 x 6 min      Lumbar Exercises: Machines for Strengthening   Other Lumbar Machine Exercise standing  rows 20# x15; AR press 10# 2x10 B    Other Lumbar Machine Exercise standing shoulder ext 5# 2x10      Lumbar Exercises: Standing   Heel Raises 15 reps    Heel Raises Limitations on black bar    Other Standing Lumbar Exercises standing hip abd/ext/flex 3# x10 B      Lumbar Exercises: Seated   Sit to Stand 20 reps   2x10 with 3# chest press and OHP   Other Seated Lumbar Exercises iso abs x15 with 3 sec hold                    PT Short Term Goals - 01/30/21 1021      PT SHORT TERM GOAL #1   Title Independent with initial HEP    Time 2    Period Weeks    Status New             PT Long Term Goals - 01/30/21 1022      PT LONG TERM GOAL #1   Title Independent with advanced HEP    Time 8    Period Weeks    Status New      PT LONG TERM GOAL #2   Title </= 2/10 pain with ADLs    Time 8    Period Weeks    Status New      PT LONG TERM GOAL #3   Title Report tolerance to walking long distance and walking for prolonged time with </= 2/10 pain    Time 8    Period Weeks    Status New      PT LONG TERM GOAL #4   Title improve BLE strength to  at least 4+ to 5/5    Time 8    Period Weeks    Status New                 Plan - 02/06/21 1741    Clinical Impression Statement Pt tolerated progression to TE well. Did have occasional catching/spasm in LB especially when making quick movements or twisting. Pt required cues for posture/form with standing rows/shoulder extension. Reports relief with stretching into flexion. Continue to progress lumbar flexibility and core stab.    PT Treatment/Interventions ADLs/Self Care Home Management;Cryotherapy;Electrical Stimulation;Iontophoresis 4mg /ml Dexamethasone;Moist Heat;Gait training;Neuromuscular re-education;Balance training;Therapeutic exercise;Therapeutic activities;Functional mobility training;Patient/family education;Manual techniques;Taping;Vasopneumatic Device;Energy conservation;Dry needling;Spinal Manipulations;Joint Manipulations;Stair training;Traction    PT Next Visit Plan Reassess HEP and progress as tolerated. Gradually progress TE with focus on endurance, proximal core/hip strengthening, and narrow base balance as tolerated. Manual and modalities as needed    Consulted and Agree with Plan of Care Patient           Patient will benefit from skilled therapeutic intervention in order to improve the following deficits and impairments:  Abnormal gait,Decreased range of motion,Increased muscle spasms,Pain,Decreased balance,Decreased mobility,Decreased strength,Improper body mechanics,Impaired flexibility  Visit Diagnosis: Muscle weakness (generalized)  Chronic low back pain, unspecified back pain laterality, unspecified whether sciatica present  Other abnormalities of gait and mobility  Muscle spasm of back  Pain in right hip  Stiffness of right hip, not elsewhere classified     Problem List Patient Active Problem List   Diagnosis Date Noted  . Arthritis 11/10/2020  . Bilateral hand pain 03/14/2020  . Toe pain, right 03/14/2020  . Acid reflux 11/27/2019  . SOB  (shortness of breath) 11/07/2018  . Atypical chest pain 11/07/2018  . Overweight 11/07/2018  . Encounter for PPD  test 06/24/2018  . Dupuytren's contracture of both hands 05/30/2018  . Acquired trigger finger of both ring fingers 05/30/2018  . Hand pain, left 05/09/2018  . Urinary frequency 06/27/2017  . Fall 06/27/2017  . Fatigue 06/27/2017  . Preventative health care 12/21/2016  . Constipation 06/08/2016  . Decreased visual acuity 06/08/2016  . History of chicken pox   . Anemia   . Controlled type 2 diabetes with retinopathy ([place]) 08/11/2013  . Osteopenia 08/19/2012  . NEOPLASM, MALIGNANT, RECTUM 06/27/2008  . Hyperlipidemia 06/27/2008  . SKIN CANCER, HX OF 06/27/2008  . History of colonic polyps 06/27/2008  . Depression with anxiety 01/19/2007   Amador Cunas, PT, DPT Donald Prose Reeshemah Nazaryan 02/06/2021, 5:45 PM  [place]. [place], [place], [address] Phone: [phone]   Fax:  [phone]  Name: VASHON ARCH MRN: [id number] Date of Birth: 15-Nov-1950

## 2021-02-11 DIAGNOSIS — M5416 Radiculopathy, lumbar region: Secondary | ICD-10-CM | POA: Diagnosis not present

## 2021-02-18 ENCOUNTER — Other Ambulatory Visit: Payer: Self-pay | Admitting: Family Medicine

## 2021-02-24 DIAGNOSIS — M2021 Hallux rigidus, right foot: Secondary | ICD-10-CM | POA: Diagnosis not present

## 2021-02-25 ENCOUNTER — Encounter: Payer: Self-pay | Admitting: Physical Therapy

## 2021-02-25 ENCOUNTER — Ambulatory Visit: Payer: Medicare HMO | Admitting: Physical Therapy

## 2021-02-25 ENCOUNTER — Other Ambulatory Visit: Payer: Self-pay

## 2021-02-25 DIAGNOSIS — M25651 Stiffness of right hip, not elsewhere classified: Secondary | ICD-10-CM | POA: Diagnosis not present

## 2021-02-25 DIAGNOSIS — G8929 Other chronic pain: Secondary | ICD-10-CM

## 2021-02-25 DIAGNOSIS — R2689 Other abnormalities of gait and mobility: Secondary | ICD-10-CM | POA: Diagnosis not present

## 2021-02-25 DIAGNOSIS — M25551 Pain in right hip: Secondary | ICD-10-CM | POA: Diagnosis not present

## 2021-02-25 DIAGNOSIS — M62830 Muscle spasm of back: Secondary | ICD-10-CM | POA: Diagnosis not present

## 2021-02-25 DIAGNOSIS — M6281 Muscle weakness (generalized): Secondary | ICD-10-CM

## 2021-02-25 DIAGNOSIS — M545 Low back pain, unspecified: Secondary | ICD-10-CM | POA: Diagnosis not present

## 2021-02-25 NOTE — Therapy (Signed)
[place]. [place], [place], [address] Phone: [phone]   Fax:  [phone]  Physical Therapy Treatment  Patient Details  Name: Destiny Romero MRN: [id number] Date of Birth: 1950-12-10 Referring Provider (PT): Dr Susa Day   Encounter Date: 02/25/2021   PT End of Session - 02/25/21 1854     Visit Number 3    Date for PT Re-Evaluation 03/21/21    PT Start Time 1815    PT Stop Time 1855    PT Time Calculation (min) 40 min    Activity Tolerance Patient tolerated treatment well    Behavior During Therapy [facility] for tasks assessed/performed             Past Medical History:  Diagnosis Date   Allergy    Anal lesion 2007   Anemia    Anesthesia complication    Per pt/ sensitive to sedation!   Central retinal artery occlusion 06/08/2016   limited vision right eye.   Cervical dysplasia 04/1991   Dr Rica Koyanagi - CIN I, cone and freeze in 1990s with normal paps since   Constipation 06/08/2016   moves bowels every 3 rd day    Decreased visual acuity 06/08/2016   Retinal bleed right eye 2017   Depression    Fatigue 06/27/2017   H/O measles    H/O mumps    History of chicken pox    Mitral valve prolapse    NO MR; no SBE prophylaxis needed   PONV (postoperative nausea and vomiting)    Pre-diabetes    highest A1c 6.4 %   Preventative health care 12/21/2016   Squamous cell carcinoma in situ of skin    Dr Syble Creek   Vision impairment    limited vision in right eye.    Past Surgical History:  Procedure Laterality Date   CERVICAL CONE BIOPSY     COLONOSCOPY  2003   COLONOSCOPY  04/01/2011   [place] - repeat in 5 years   COLONOSCOPY W/ POLYPECTOMY  2007   Dr Earlean Shawl   EYE SURGERY Bilateral    cataracts   [place] Left 12/2011   Dr Ubaldo Glassing ; ovarian cyst   RECTAL SURGERY  05/28/06   pre-canerous lesion removed in 2007; Dr Morton Stall, [facility] Perch   rt foot morton's surgery     TUBAL LIGATION       There were no vitals filed for this visit.   Subjective Assessment - 02/25/21 1828     Subjective Pt states LB is feeling better overall but that injection did not last. Does not want to do anything that puts too much pressure on foot d/t toe pain. Would like modified core ex's because cannot tolerate planking d/t foot pain.    Currently in Pain? Yes    Pain Score 3     Pain Location Back                [facility] PT Assessment - 02/25/21 0001       Observation/Other Assessments   Focus on Therapeutic Outcomes (FOTO)  51%                           [facility] Adult PT Treatment/Exercise - 02/25/21 0001       Lumbar Exercises: Stretches   Active Hamstring Stretch Right;Left;1 rep;20 seconds    Piriformis Stretch 2 reps;20 seconds;Right;Left    Piriformis Stretch  Limitations seated    Gastroc Stretch Right;Left;1 rep;30 seconds    Other Lumbar Stretch Exercise child's pose fwd/lat x15 sec      Lumbar Exercises: Aerobic   UBE (Upper Arm Bike) L2 x 3 min each      Lumbar Exercises: Machines for Strengthening   Other Lumbar Machine Exercise standing shoulder ext 10# 2x10; AR press 15# 1x10 B      Lumbar Exercises: Seated   Sit to Stand 20 reps   with yellow ball chest press and OHP   Other Seated Lumbar Exercises iso abs x15 with 3 sec hold      Lumbar Exercises: Quadruped   Opposite Arm/Leg Raise 5 reps   2x5 B                     PT Short Term Goals - 01/30/21 1021       PT SHORT TERM GOAL #1   Title Independent with initial HEP    Time 2    Period Weeks    Status New               PT Long Term Goals - 02/25/21 1859       PT LONG TERM GOAL #1   Title Independent with advanced HEP    Time 8    Period Weeks    Status Partially Met      PT LONG TERM GOAL #2   Title </= 2/10 pain with ADLs    Time 8    Period Weeks    Status Partially Met      PT LONG TERM GOAL #3   Title Report tolerance to walking long distance and  walking for prolonged time with </= 2/10 pain    Time 8    Period Weeks    Status Partially Met      PT LONG TERM GOAL #4   Title improve BLE strength to at least 4+ to 5/5    Time 8    Period Weeks    Status Partially Met                   Plan - 02/25/21 1854     Clinical Impression Statement Pt returns to clinic after 2 weeks; reports overall decrease in LBP. Trying to get back into gym and incoporate machines we have done here; states she went to gym prior to appt and completed seated rows, lat pulldowns, and chest press. Educated on core ex's in quadruped per pt request; she is unable to perform planking ex's d/t foot pain. Continue to progress lumbar flexibility and core stab.    PT Treatment/Interventions ADLs/Self Care Home Management;Cryotherapy;Electrical Stimulation;Iontophoresis 4mg /ml Dexamethasone;Moist Heat;Gait training;Neuromuscular re-education;Balance training;Therapeutic exercise;Therapeutic activities;Functional mobility training;Patient/family education;Manual techniques;Taping;Vasopneumatic Device;Energy conservation;Dry needling;Spinal Manipulations;Joint Manipulations;Stair training;Traction    PT Next Visit Plan Reassess HEP and progress as tolerated. Gradually progress TE with focus on endurance, proximal core/hip strengthening, and narrow base balance as tolerated. Manual and modalities as needed    Consulted and Agree with Plan of Care Patient             Patient will benefit from skilled therapeutic intervention in order to improve the following deficits and impairments:  Abnormal gait, Decreased range of motion, Increased muscle spasms, Pain, Decreased balance, Decreased mobility, Decreased strength, Improper body mechanics, Impaired flexibility  Visit Diagnosis: Muscle weakness (generalized)  Chronic low back pain, unspecified back pain laterality, unspecified whether sciatica present  Other abnormalities of gait  and mobility  Muscle spasm  of back     Problem List Patient Active Problem List   Diagnosis Date Noted   Arthritis 11/10/2020   Bilateral hand pain 03/14/2020   Toe pain, right 03/14/2020   Acid reflux 11/27/2019   SOB (shortness of breath) 11/07/2018   Atypical chest pain 11/07/2018   Overweight 11/07/2018   Encounter for PPD test 06/24/2018   Dupuytren's contracture of both hands 05/30/2018   Acquired trigger finger of both ring fingers 05/30/2018   Hand pain, left 05/09/2018   Urinary frequency 06/27/2017   Fall 06/27/2017   Fatigue 06/27/2017   Preventative health care 12/21/2016   Constipation 06/08/2016   Decreased visual acuity 06/08/2016   History of chicken pox    Anemia    Controlled type 2 diabetes with retinopathy ([place]) 08/11/2013   Osteopenia 08/19/2012   NEOPLASM, MALIGNANT, RECTUM 06/27/2008   Hyperlipidemia 06/27/2008   SKIN CANCER, HX OF 06/27/2008   History of colonic polyps 06/27/2008   Depression with anxiety 01/19/2007   Amador Cunas, PT, DPT Donald Prose Christohper Dube 02/25/2021, 7:00 PM  [place]. [place], [place], [address] Phone: [phone]   Fax:  [phone]  Name: RENAYE JANICKI MRN: [id number] Date of Birth: 10/03/1950

## 2021-03-03 DIAGNOSIS — M8580 Other specified disorders of bone density and structure, unspecified site: Secondary | ICD-10-CM | POA: Diagnosis not present

## 2021-03-03 DIAGNOSIS — M48061 Spinal stenosis, lumbar region without neurogenic claudication: Secondary | ICD-10-CM | POA: Insufficient documentation

## 2021-03-03 DIAGNOSIS — M545 Low back pain, unspecified: Secondary | ICD-10-CM | POA: Diagnosis not present

## 2021-03-03 DIAGNOSIS — M5416 Radiculopathy, lumbar region: Secondary | ICD-10-CM | POA: Diagnosis not present

## 2021-03-03 DIAGNOSIS — E119 Type 2 diabetes mellitus without complications: Secondary | ICD-10-CM | POA: Diagnosis not present

## 2021-03-04 ENCOUNTER — Encounter: Payer: Self-pay | Admitting: Physical Therapy

## 2021-03-04 ENCOUNTER — Ambulatory Visit: Payer: Medicare HMO | Admitting: Physical Therapy

## 2021-03-04 ENCOUNTER — Other Ambulatory Visit: Payer: Self-pay

## 2021-03-04 DIAGNOSIS — M25651 Stiffness of right hip, not elsewhere classified: Secondary | ICD-10-CM | POA: Diagnosis not present

## 2021-03-04 DIAGNOSIS — R2689 Other abnormalities of gait and mobility: Secondary | ICD-10-CM | POA: Diagnosis not present

## 2021-03-04 DIAGNOSIS — M545 Low back pain, unspecified: Secondary | ICD-10-CM

## 2021-03-04 DIAGNOSIS — M62830 Muscle spasm of back: Secondary | ICD-10-CM

## 2021-03-04 DIAGNOSIS — G8929 Other chronic pain: Secondary | ICD-10-CM | POA: Diagnosis not present

## 2021-03-04 DIAGNOSIS — M25551 Pain in right hip: Secondary | ICD-10-CM | POA: Diagnosis not present

## 2021-03-04 DIAGNOSIS — M6281 Muscle weakness (generalized): Secondary | ICD-10-CM | POA: Diagnosis not present

## 2021-03-04 NOTE — Therapy (Signed)
[place]. [place], [place], [address] Phone: [phone]   Fax:  [phone]  Physical Therapy Treatment  Patient Details  Name: Destiny Romero MRN: [id number] Date of Birth: 1951-01-20 Referring Provider (PT): Dr Susa Day   Encounter Date: 03/04/2021   PT End of Session - 03/04/21 1856     Visit Number 4    Date for PT Re-Evaluation 03/21/21    PT Start Time 2263    PT Stop Time 1856    PT Time Calculation (min) 42 min    Activity Tolerance Patient tolerated treatment well    Behavior During Therapy [facility] for tasks assessed/performed             Past Medical History:  Diagnosis Date   Allergy    Anal lesion 2007   Anemia    Anesthesia complication    Per pt/ sensitive to sedation!   Central retinal artery occlusion 06/08/2016   limited vision right eye.   Cervical dysplasia 04/1991   Dr Rica Koyanagi - CIN I, cone and freeze in 1990s with normal paps since   Constipation 06/08/2016   moves bowels every 3 rd day    Decreased visual acuity 06/08/2016   Retinal bleed right eye 2017   Depression    Fatigue 06/27/2017   H/O measles    H/O mumps    History of chicken pox    Mitral valve prolapse    NO MR; no SBE prophylaxis needed   PONV (postoperative nausea and vomiting)    Pre-diabetes    highest A1c 6.4 %   Preventative health care 12/21/2016   Squamous cell carcinoma in situ of skin    Dr Syble Creek   Vision impairment    limited vision in right eye.    Past Surgical History:  Procedure Laterality Date   CERVICAL CONE BIOPSY     COLONOSCOPY  2003   COLONOSCOPY  04/01/2011   [place] - repeat in 5 years   COLONOSCOPY W/ POLYPECTOMY  2007   Dr Earlean Shawl   EYE SURGERY Bilateral    cataracts   [place] Left 12/2011   Dr Ubaldo Glassing ; ovarian cyst   RECTAL SURGERY  05/28/06   pre-canerous lesion removed in 2007; Dr Morton Stall, [facility] Perch   rt foot morton's surgery     TUBAL LIGATION       There were no vitals filed for this visit.   Subjective Assessment - 03/04/21 1813     Subjective Pt states no new changes this rx. Would like to review AR press so she can set it up at the gym when she goes.    Currently in Pain? Yes    Pain Score 2     Pain Location Back    Pain Orientation Right;Lower                               [facility] Adult PT Treatment/Exercise - 03/04/21 0001       Lumbar Exercises: Aerobic   UBE (Upper Arm Bike) L2 x 3 min fwd    Recumbent Bike L 2.5 x 6 min      Lumbar Exercises: Machines for Strengthening   Leg Press 40# 2x10    Other Lumbar Machine Exercise rows and lats 25# 2x10    Other Lumbar Machine Exercise standing shoulder ext 10# 2x10; AR press 10# 1x10  B; standing rotations with arms extended 5# x10 B      Lumbar Exercises: Quadruped   Opposite Arm/Leg Raise 5 reps    Opposite Arm/Leg Raise Limitations tactile cuing to reduce compensation                      PT Short Term Goals - 01/30/21 1021       PT SHORT TERM GOAL #1   Title Independent with initial HEP    Time 2    Period Weeks    Status New               PT Long Term Goals - 02/25/21 1859       PT LONG TERM GOAL #1   Title Independent with advanced HEP    Time 8    Period Weeks    Status Partially Met      PT LONG TERM GOAL #2   Title </= 2/10 pain with ADLs    Time 8    Period Weeks    Status Partially Met      PT LONG TERM GOAL #3   Title Report tolerance to walking long distance and walking for prolonged time with </= 2/10 pain    Time 8    Period Weeks    Status Partially Met      PT LONG TERM GOAL #4   Title improve BLE strength to at least 4+ to 5/5    Time 8    Period Weeks    Status Partially Met                   Plan - 03/04/21 1857     Clinical Impression Statement Pt tolerated progression of TE well with no increase in LBP. Lumbar flexion stretching between ex's to relieve pressure on  LB. Education on form with AR press to incorporate in personal gym routine. Correction of form to reduce compensations with birddog exercise in quadruped. Likely d/c next session; will answer any questions regarding gym routine/flexibility routine. Pt plans to continue exercise independently with the goal of reducing pain and delaying or preventing need for lumbar surgery.    PT Treatment/Interventions ADLs/Self Care Home Management;Cryotherapy;Electrical Stimulation;Iontophoresis 15m/ml Dexamethasone;Moist Heat;Gait training;Neuromuscular re-education;Balance training;Therapeutic exercise;Therapeutic activities;Functional mobility training;Patient/family education;Manual techniques;Taping;Vasopneumatic Device;Energy conservation;Dry needling;Spinal Manipulations;Joint Manipulations;Stair training;Traction    PT Next Visit Plan Reassess HEP and progress as tolerated. Gradually progress TE with focus on endurance, proximal core/hip strengthening, and narrow base balance as tolerated. Manual and modalities as needed    Consulted and Agree with Plan of Care Patient             Patient will benefit from skilled therapeutic intervention in order to improve the following deficits and impairments:  Abnormal gait, Decreased range of motion, Increased muscle spasms, Pain, Decreased balance, Decreased mobility, Decreased strength, Improper body mechanics, Impaired flexibility  Visit Diagnosis: Muscle weakness (generalized)  Chronic low back pain, unspecified back pain laterality, unspecified whether sciatica present  Other abnormalities of gait and mobility  Muscle spasm of back  Pain in right hip  Stiffness of right hip, not elsewhere classified     Problem List Patient Active Problem List   Diagnosis Date Noted   Arthritis 11/10/2020   Bilateral hand pain 03/14/2020   Toe pain, right 03/14/2020   Acid reflux 11/27/2019   SOB (shortness of breath) 11/07/2018   Atypical chest pain  11/07/2018   Overweight 11/07/2018   Encounter for PPD  test 06/24/2018   Dupuytren's contracture of both hands 05/30/2018   Acquired trigger finger of both ring fingers 05/30/2018   Hand pain, left 05/09/2018   Urinary frequency 06/27/2017   Fall 06/27/2017   Fatigue 06/27/2017   Preventative health care 12/21/2016   Constipation 06/08/2016   Decreased visual acuity 06/08/2016   History of chicken pox    Anemia    Controlled type 2 diabetes with retinopathy ([place]) 08/11/2013   Osteopenia 08/19/2012   NEOPLASM, MALIGNANT, RECTUM 06/27/2008   Hyperlipidemia 06/27/2008   SKIN CANCER, HX OF 06/27/2008   History of colonic polyps 06/27/2008   Depression with anxiety 01/19/2007   Amador Cunas, PT, DPT Donald Prose Kursten Kruk 03/04/2021, 6:59 PM  [place]. [place], [place], [address] Phone: [phone]   Fax:  [phone]  Name: ANNAROSE OUELLET MRN: [id number] Date of Birth: March 29, 1951

## 2021-03-14 DIAGNOSIS — H5203 Hypermetropia, bilateral: Secondary | ICD-10-CM | POA: Diagnosis not present

## 2021-03-14 DIAGNOSIS — E119 Type 2 diabetes mellitus without complications: Secondary | ICD-10-CM | POA: Diagnosis not present

## 2021-03-14 DIAGNOSIS — H35371 Puckering of macula, right eye: Secondary | ICD-10-CM | POA: Diagnosis not present

## 2021-03-14 LAB — HM DIABETES EYE EXAM

## 2021-03-17 ENCOUNTER — Encounter: Payer: Self-pay | Admitting: *Deleted

## 2021-03-18 ENCOUNTER — Ambulatory Visit: Payer: Medicare HMO | Attending: Specialist | Admitting: Physical Therapy

## 2021-03-18 ENCOUNTER — Other Ambulatory Visit: Payer: Self-pay

## 2021-03-18 ENCOUNTER — Encounter: Payer: Self-pay | Admitting: Physical Therapy

## 2021-03-18 DIAGNOSIS — G8929 Other chronic pain: Secondary | ICD-10-CM | POA: Diagnosis not present

## 2021-03-18 DIAGNOSIS — M25651 Stiffness of right hip, not elsewhere classified: Secondary | ICD-10-CM | POA: Insufficient documentation

## 2021-03-18 DIAGNOSIS — M62830 Muscle spasm of back: Secondary | ICD-10-CM | POA: Diagnosis not present

## 2021-03-18 DIAGNOSIS — M6281 Muscle weakness (generalized): Secondary | ICD-10-CM

## 2021-03-18 DIAGNOSIS — M545 Low back pain, unspecified: Secondary | ICD-10-CM | POA: Diagnosis not present

## 2021-03-18 DIAGNOSIS — R2689 Other abnormalities of gait and mobility: Secondary | ICD-10-CM

## 2021-03-18 DIAGNOSIS — M25551 Pain in right hip: Secondary | ICD-10-CM | POA: Diagnosis not present

## 2021-03-18 NOTE — Therapy (Signed)
[place] [place], [place], [address] Phone: [phone]   Fax:  [phone]  Physical Therapy Treatment  Patient Details  Name: Destiny Romero MRN: [id number] Date of Birth: December 20, 1950 Referring Provider (PT): Dr Susa Day   Encounter Date: 03/18/2021   PT End of Session - 03/18/21 1144     Visit Number 5    Date for PT Re-Evaluation 03/21/21    PT Start Time 4536    PT Stop Time 1127    PT Time Calculation (min) 43 min    Activity Tolerance Patient tolerated treatment well    Behavior During Therapy [facility] for tasks assessed/performed             Past Medical History:  Diagnosis Date   Allergy    Anal lesion 2007   Anemia    Anesthesia complication    Per pt/ sensitive to sedation!   Central retinal artery occlusion 06/08/2016   limited vision right eye.   Cervical dysplasia 04/1991   Dr Rica Koyanagi - CIN I, cone and freeze in 1990s with normal paps since   Constipation 06/08/2016   moves bowels every 3 rd day    Decreased visual acuity 06/08/2016   Retinal bleed right eye 2017   Depression    Fatigue 06/27/2017   H/O measles    H/O mumps    History of chicken pox    Mitral valve prolapse    NO MR; no SBE prophylaxis needed   PONV (postoperative nausea and vomiting)    Pre-diabetes    highest A1c 6.4 %   Preventative health care 12/21/2016   Squamous cell carcinoma in situ of skin    Dr Syble Creek   Vision impairment    limited vision in right eye.    Past Surgical History:  Procedure Laterality Date   CERVICAL CONE BIOPSY     COLONOSCOPY  2003   COLONOSCOPY  04/01/2011   [place] - repeat in 5 years   COLONOSCOPY W/ POLYPECTOMY  2007   Dr Earlean Shawl   EYE SURGERY Bilateral    cataracts   [place] Left 12/2011   Dr Ubaldo Glassing ; ovarian cyst   RECTAL SURGERY  05/28/06   pre-canerous lesion removed in 2007; Dr Morton Stall, [facility]ch   rt foot morton's surgery     TUBAL LIGATION       There were no vitals filed for this visit.   Subjective Assessment - 03/18/21 1054     Subjective Pt states LBP remains unchanged from last visit. Improvement overall since start of care; has gone back to water aerobics, notes difficulty with rotational movements. Would like to schedule one more check in before d/c.    Currently in Pain? Yes    Pain Score 3     Pain Location Back    Pain Orientation Right;Lower                               [facility] Adult PT Treatment/Exercise - 03/18/21 0001       Lumbar Exercises: Stretches   Double Knee to Chest Stretch 2 reps;20 seconds    Lower Trunk Rotation 5 reps   3 sec hold   Other Lumbar Stretch Exercise seated fwd lumbar flexion x5 5sec hold      Lumbar Exercises: Aerobic   UBE (Upper Arm Bike) L2 x 2 min each  Nustep L5 x 6 min      Lumbar Exercises: Machines for Strengthening   Other Lumbar Machine Exercise rows 15# x10   discontinued today d/t increase in LBP; states she just got out of water aerobics this morning     Lumbar Exercises: Supine   Clam 20 reps;2 seconds    Clam Limitations red tb    Other Supine Lumbar Exercises marching with red tb 2x10 BLE    Other Supine Lumbar Exercises dktc, ltr, small bridges with iso abs on pball x15                      PT Short Term Goals - 01/30/21 1021       PT SHORT TERM GOAL #1   Title Independent with initial HEP    Time 2    Period Weeks    Status New               PT Long Term Goals - 02/25/21 1859       PT LONG TERM GOAL #1   Title Independent with advanced HEP    Time 8    Period Weeks    Status Partially Met      PT LONG TERM GOAL #2   Title </= 2/10 pain with ADLs    Time 8    Period Weeks    Status Partially Met      PT LONG TERM GOAL #3   Title Report tolerance to walking long distance and walking for prolonged time with </= 2/10 pain    Time 8    Period Weeks    Status Partially Met      PT LONG TERM GOAL  #4   Title improve BLE strength to at least 4+ to 5/5    Time 8    Period Weeks    Status Partially Met                   Plan - 03/18/21 1147     Clinical Impression Statement Pt presents to clinic with c/o increased LBP this rx; states she just finished with water aerobics. Shifted focus to supine flexibility and stabilization ex's, pt tolerated well. We decided to have her check in for one more session before d/c d/t increase in pain. Provided education on condition d/t concerns about potential for surgery in the future. Assess need for continued PT next rx.    PT Treatment/Interventions ADLs/Self Care Home Management;Cryotherapy;Electrical Stimulation;Iontophoresis 4mg /ml Dexamethasone;Moist Heat;Gait training;Neuromuscular re-education;Balance training;Therapeutic exercise;Therapeutic activities;Functional mobility training;Patient/family education;Manual techniques;Taping;Vasopneumatic Device;Energy conservation;Dry needling;Spinal Manipulations;Joint Manipulations;Stair training;Traction    PT Next Visit Plan Reassess HEP and progress as tolerated. Gradually progress TE with focus on endurance, proximal core/hip strengthening, and narrow base balance as tolerated. Manual and modalities as needed    Consulted and Agree with Plan of Care Patient             Patient will benefit from skilled therapeutic intervention in order to improve the following deficits and impairments:  Abnormal gait, Decreased range of motion, Increased muscle spasms, Pain, Decreased balance, Decreased mobility, Decreased strength, Improper body mechanics, Impaired flexibility  Visit Diagnosis: Muscle weakness (generalized)  Chronic low back pain, unspecified back pain laterality, unspecified whether sciatica present  Other abnormalities of gait and mobility  Muscle spasm of back     Problem List Patient Active Problem List   Diagnosis Date Noted   Arthritis 11/10/2020   Bilateral hand pain  03/14/2020  Toe pain, right 03/14/2020   Acid reflux 11/27/2019   SOB (shortness of breath) 11/07/2018   Atypical chest pain 11/07/2018   Overweight 11/07/2018   Encounter for PPD test 06/24/2018   Dupuytren's contracture of both hands 05/30/2018   Acquired trigger finger of both ring fingers 05/30/2018   Hand pain, left 05/09/2018   Urinary frequency 06/27/2017   Fall 06/27/2017   Fatigue 06/27/2017   Preventative health care 12/21/2016   Constipation 06/08/2016   Decreased visual acuity 06/08/2016   History of chicken pox    Anemia    Controlled type 2 diabetes with retinopathy ([place]) 08/11/2013   Osteopenia 08/19/2012   NEOPLASM, MALIGNANT, RECTUM 06/27/2008   Hyperlipidemia 06/27/2008   SKIN CANCER, HX OF 06/27/2008   History of colonic polyps 06/27/2008   Depression with anxiety 01/19/2007   Amador Cunas, PT, DPT Donald Prose Makenleigh Crownover 03/18/2021, 11:51 AM  [place] [place], [place], [address] Phone: [phone]   Fax:  [phone]  Name: Destiny Romero MRN: [id number] Date of Birth: 05-26-1951

## 2021-03-20 ENCOUNTER — Ambulatory Visit (INDEPENDENT_AMBULATORY_CARE_PROVIDER_SITE_OTHER): Payer: Medicare HMO | Admitting: Family Medicine

## 2021-03-20 ENCOUNTER — Other Ambulatory Visit: Payer: Self-pay

## 2021-03-20 VITALS — BP 118/66 | HR 68 | Temp 97.5°F | Resp 16 | Wt 173.4 lb

## 2021-03-20 DIAGNOSIS — Z23 Encounter for immunization: Secondary | ICD-10-CM | POA: Diagnosis not present

## 2021-03-20 DIAGNOSIS — D649 Anemia, unspecified: Secondary | ICD-10-CM | POA: Diagnosis not present

## 2021-03-20 DIAGNOSIS — E785 Hyperlipidemia, unspecified: Secondary | ICD-10-CM | POA: Diagnosis not present

## 2021-03-20 DIAGNOSIS — M8580 Other specified disorders of bone density and structure, unspecified site: Secondary | ICD-10-CM | POA: Diagnosis not present

## 2021-03-20 DIAGNOSIS — E11319 Type 2 diabetes mellitus with unspecified diabetic retinopathy without macular edema: Secondary | ICD-10-CM | POA: Diagnosis not present

## 2021-03-20 DIAGNOSIS — K219 Gastro-esophageal reflux disease without esophagitis: Secondary | ICD-10-CM | POA: Diagnosis not present

## 2021-03-20 LAB — CBC
HCT: 35 % — ABNORMAL LOW (ref 36.0–46.0)
Hemoglobin: 11.9 g/dL — ABNORMAL LOW (ref 12.0–15.0)
MCHC: 34.1 g/dL (ref 30.0–36.0)
MCV: 89.9 fl (ref 78.0–100.0)
Platelets: 239 10*3/uL (ref 150.0–400.0)
RBC: 3.89 Mil/uL (ref 3.87–5.11)
RDW: 14.4 % (ref 11.5–15.5)
WBC: 6.1 10*3/uL (ref 4.0–10.5)

## 2021-03-20 LAB — COMPREHENSIVE METABOLIC PANEL
ALT: 25 U/L (ref 0–35)
AST: 20 U/L (ref 0–37)
Albumin: 4.3 g/dL (ref 3.5–5.2)
Alkaline Phosphatase: 49 U/L (ref 39–117)
BUN: 21 mg/dL (ref 6–23)
CO2: 28 mEq/L (ref 19–32)
Calcium: 9.6 mg/dL (ref 8.4–10.5)
Chloride: 106 mEq/L (ref 96–112)
Creatinine, Ser: 1.08 mg/dL (ref 0.40–1.20)
GFR: 51.64 mL/min — ABNORMAL LOW (ref >60.00)
Glucose, Bld: 173 mg/dL — ABNORMAL HIGH (ref 70–99)
Potassium: 4.6 mEq/L (ref 3.5–5.1)
Sodium: 141 mEq/L (ref 135–145)
Total Bilirubin: 1 mg/dL (ref 0.2–1.2)
Total Protein: 6.5 g/dL (ref 6.0–8.3)

## 2021-03-20 LAB — MICROALBUMIN / CREATININE URINE RATIO
Creatinine,U: 79.3 mg/dL
Microalb Creat Ratio: 0.9 mg/g (ref 0.0–30.0)
Microalb, Ur: <0.7 mg/dL (ref 0.0–1.9)

## 2021-03-20 LAB — LIPID PANEL
Cholesterol: 204 mg/dL — ABNORMAL HIGH (ref 0–200)
HDL: 60.8 mg/dL (ref >39.00)
LDL Cholesterol: 110 mg/dL — ABNORMAL HIGH (ref 0–99)
NonHDL: 142.81
Total CHOL/HDL Ratio: 3
Triglycerides: 163 mg/dL — ABNORMAL HIGH (ref 0.0–149.0)
VLDL: 32.6 mg/dL (ref 0.0–40.0)

## 2021-03-20 LAB — HEMOGLOBIN A1C: Hgb A1c MFr Bld: 7 % — ABNORMAL HIGH (ref 4.6–6.5)

## 2021-03-20 LAB — TSH: TSH: 1.38 u[IU]/mL (ref 0.35–5.50)

## 2021-03-20 NOTE — Progress Notes (Signed)
Patient ID: Destiny Romero, female    DOB: [birth year]  Age: 70 y.o. MRN: [id number]    Subjective:  Subjective  HPI [name] presents for office visit today for follow up on type 2 diabetes and osteopenia. She reports following up with Dr. Tonita Cong regarding her right first toe pain at the lateral side. She states that it is connected to her L5 pinched nerve. She reports getting a shot in her toe which has helped alleviate some pain. She states that the physical therapy has helped as well. Denies CP/palp/SOB/HA/congestion/fevers or GU c/o. Taking meds as prescribed. She was sick for 2 months from Christmas and had a sinus infection and bronchitis. She states that she was in bed until January and tested for COVID-19 once and came back negative. She states that recently she had a glucose readings of 300, 400, and 500, which she took glimepiride for. Lab Results  Component Value Date   HGBA1C 7.0 (H) 03/20/2021   She reports feeling constipation and states that the stool is hard and varies in size. She endorses eating grains, hydration, and taking probiotics.   Review of Systems  Constitutional:  Negative for chills, fatigue and fever.  HENT:  Negative for congestion, rhinorrhea, sinus pressure, sinus pain and sore throat.   Eyes:  Negative for pain.  Respiratory:  Negative for cough and shortness of breath.   Cardiovascular:  Negative for chest pain, palpitations and leg swelling.  Gastrointestinal:  Positive for constipation. Negative for abdominal pain, blood in stool, diarrhea, nausea and vomiting.  Genitourinary:  Negative for decreased urine volume, flank pain, frequency, vaginal bleeding and vaginal discharge.  Musculoskeletal:  Negative for back pain.  Neurological:  Negative for headaches.   History Past Medical History:  Diagnosis Date   Allergy    Anal lesion 2007   Anemia    Anesthesia complication    Per pt/ sensitive to sedation!   Central retinal artery occlusion  06/08/2016   limited vision right eye.   Cervical dysplasia 04/1991   Dr Rica Koyanagi - CIN I, cone and freeze in 1990s with normal paps since   Constipation 06/08/2016   moves bowels every 3 rd day    Decreased visual acuity 06/08/2016   Retinal bleed right eye 2017   Depression    Fatigue 06/27/2017   H/O measles    H/O mumps    History of chicken pox    Mitral valve prolapse    NO MR; no SBE prophylaxis needed   PONV (postoperative nausea and vomiting)    Pre-diabetes    highest A1c 6.4 %   Preventative health care 12/21/2016   Squamous cell carcinoma in situ of skin    Dr Syble Creek   Vision impairment    limited vision in right eye.    She has a past surgical history that includes Rectal surgery (05/28/06); Laminectomy; Cervical cone biopsy; Colonoscopy w/ polypectomy (2007); rt foot morton's surgery; Oophorectomy (Left, 12/2011); Tubal ligation; Colonoscopy (2003); Colonoscopy (04/01/2011); and Eye surgery (Bilateral).   Her family history includes COPD in her father; Cancer in her brother; Dementia in her mother; Diabetes in her brother, brother, maternal aunt, maternal grandmother, mother, paternal grandmother, and sister; Heart disease in her father, maternal aunt, maternal uncle, paternal grandfather, and paternal grandmother; Lymphoma in her brother; Prostate cancer in her father; Thyroid cancer in her sister.She reports that she has never smoked. She has never used smokeless tobacco. She reports current alcohol use. She reports that she does  not use drugs.  Current Outpatient Medications on File Prior to Visit  Medication Sig Dispense Refill   Calcium 200 MG TABS Take by mouth. Tale [phone] mg daily     cetirizine (ZYRTEC) 10 MG tablet Take 10 mg by mouth daily.     Cholecalciferol (VITAMIN D) 2000 units CAPS      famotidine (PEPCID) 40 MG tablet TAKE 1 TABLET BY MOUTH EVERYDAY AT BEDTIME 90 tablet 1   FERROCITE 324 MG TABS tablet TAKE 1 TABLET (106 MG OF IRON TOTAL) BY MOUTH  DAILY. 90 tablet 1   glimepiride (AMARYL) 1 MG tablet TAKE 1 TABLET (1 MG TOTAL) BY MOUTH 2 (TWO) TIMES DAILY WITH A MEAL. 180 tablet 1   lamoTRIgine (LAMICTAL) 200 MG tablet Take 200 mg by mouth daily.     MOBIC 7.5 MG tablet Take 7.5 mg by mouth daily.     omega-3 acid ethyl esters (LOVAZA) 1 g capsule Take by mouth daily.     OneTouch Delica Lancets 16X MISC Use to test blood sugar 3X daily.  Dx Code: E11.65 300 each 1   ONETOUCH VERIO test strip USE TO TEST BLOOD SUGAR 3X DAILY. DX CODE: E11.65 300 strip 12   No current facility-administered medications on file prior to visit.     Objective:  Objective  Physical Exam Constitutional:      General: She is not in acute distress.    Appearance: Normal appearance. She is not ill-appearing or toxic-appearing.  HENT:     Head: Normocephalic and atraumatic.     Right Ear: Tympanic membrane, ear canal and external ear normal.     Left Ear: Tympanic membrane, ear canal and external ear normal.     Nose: No congestion or rhinorrhea.  Eyes:     Extraocular Movements: Extraocular movements intact.     Pupils: Pupils are equal, round, and reactive to light.  Cardiovascular:     Rate and Rhythm: Normal rate and regular rhythm.     Pulses: Normal pulses.     Heart sounds: Normal heart sounds. No murmur heard. Pulmonary:     Effort: Pulmonary effort is normal. No respiratory distress.     Breath sounds: Normal breath sounds. No wheezing, rhonchi or rales.  Abdominal:     General: Bowel sounds are normal.     Palpations: Abdomen is soft. There is no mass.     Tenderness: no abdominal tenderness There is no guarding.     Hernia: No hernia is present.  Musculoskeletal:        General: Normal range of motion.     Cervical back: Normal range of motion and neck supple.  Skin:    General: Skin is warm and dry.  Neurological:     Mental Status: She is alert and oriented to person, place, and time.  Psychiatric:        Behavior: Behavior  normal.   BP 118/66   Pulse 68   Temp (!) 97.5 F (36.4 C)   Resp 16   Wt 173 lb 6.4 oz (78.7 kg)   LMP 04/07/2001 (Approximate)   SpO2 96%   BMI 29.76 kg/m  Wt Readings from Last 3 Encounters:  03/20/21 173 lb 6.4 oz (78.7 kg)  03/14/20 171 lb 3.2 oz (77.7 kg)  07/13/19 165 lb (74.8 kg)     Lab Results  Component Value Date   WBC 6.1 03/20/2021   HGB 11.9 (L) 03/20/2021   HCT 35.0 (L) 03/20/2021   PLT  239.0 03/20/2021   GLUCOSE 173 (H) 03/20/2021   CHOL 204 (H) 03/20/2021   TRIG 163.0 (H) 03/20/2021   HDL 60.80 03/20/2021   LDLDIRECT 146.5 08/11/2013   LDLCALC 110 (H) 03/20/2021   ALT 25 03/20/2021   AST 20 03/20/2021   NA 141 03/20/2021   K 4.6 03/20/2021   CL 106 03/20/2021   CREATININE 1.08 03/20/2021   BUN 21 03/20/2021   CO2 28 03/20/2021   TSH 1.38 03/20/2021   HGBA1C 7.0 (H) 03/20/2021   MICROALBUR <0.7 03/20/2021    ECHOCARDIOGRAM COMPLETE  Result Date: 11/01/2019    ECHOCARDIOGRAM REPORT   Patient Name:   ANDI LAYFIELD [facility] Date of Exam: 11/01/2019 Medical Rec #:  [id number]      Height:       63.7 in Accession #:    [id number]     Weight:       165.0 lb Date of Birth:  Jan 11, 1951      BSA:          1.797 m Patient Age:    64 years       BP:           100/60 mmHg Patient Gender: F              HR:           58 bpm. Exam Location:  [place] Procedure: 2D Echo, Cardiac Doppler, Color Doppler and Strain Analysis Indications:    Chest pain  History:        Patient has no prior history of Echocardiogram examinations.                 Signs/Symptoms:Chest Pain and Shortness of Breath; Risk                 Factors:Diabetes and Dyslipidemia.  Sonographer:    Cardell Peach RDCS (AE) Referring Phys: [place]  1. Left ventricular ejection fraction, by estimation, is 60 to 65%. The left ventricle has normal function. The left ventricle has no regional wall motion abnormalities. Left ventricular diastolic parameters were normal.  2. Right ventricular  systolic function is normal. The right ventricular size is normal. There is normal pulmonary artery systolic pressure.  3. The mitral valve is normal in structure and function. Mild mitral valve regurgitation. No evidence of mitral stenosis.  4. The aortic valve is normal in structure and function. Aortic valve regurgitation is trivial. No aortic stenosis is present.  5. The inferior vena cava is normal in size with greater than 50% respiratory variability, suggesting right atrial pressure of 3 mmHg. FINDINGS  Left Ventricle: Left ventricular ejection fraction, by estimation, is 60 to 65%. The left ventricle has normal function. The left ventricle has no regional wall motion abnormalities. The left ventricular internal cavity size was normal in size. There is  no left ventricular hypertrophy. Left ventricular diastolic parameters were normal. Right Ventricle: The right ventricular size is normal. No increase in right ventricular wall thickness. Right ventricular systolic function is normal. There is normal pulmonary artery systolic pressure. The tricuspid regurgitant velocity is 2.18 m/s, and  with an assumed right atrial pressure of 3 mmHg, the estimated right ventricular systolic pressure is 41.9 mmHg. Left Atrium: Left atrial size was normal in size. Right Atrium: Right atrial size was normal in size. Pericardium: There is no evidence of pericardial effusion. Mitral Valve: The mitral valve is normal in structure and function. Normal mobility of the mitral valve leaflets.  Mild mitral valve regurgitation. No evidence of mitral valve stenosis. Tricuspid Valve: The tricuspid valve is normal in structure. Tricuspid valve regurgitation is trivial. No evidence of tricuspid stenosis. Aortic Valve: The aortic valve is normal in structure and function. Aortic valve regurgitation is trivial. Aortic regurgitation PHT measures 409 msec. No aortic stenosis is present. Pulmonic Valve: The pulmonic valve was normal in structure.  Pulmonic valve regurgitation is not visualized. No evidence of pulmonic stenosis. Aorta: The aortic root is normal in size and structure. Venous: The inferior vena cava is normal in size with greater than 50% respiratory variability, suggesting right atrial pressure of 3 mmHg. IAS/Shunts: No atrial level shunt detected by color flow Doppler.  LEFT VENTRICLE PLAX 2D LVIDd:         4.15 cm  Diastology LVIDs:         2.21 cm  LV e' lateral: 6.96 cm/s LV PW:         0.78 cm  LV e' medial:  6.09 cm/s LV IVS:        0.85 cm LVOT diam:     1.90 cm LV SV:         49 LV SV Index:   27 LVOT Area:     2.84 cm  RIGHT VENTRICLE             IVC RV Basal diam:  3.69 cm     IVC diam: 1.66 cm RV S prime:     14.10 cm/s TAPSE (M-mode): 2.1 cm LEFT ATRIUM             Index       RIGHT ATRIUM           Index LA diam:        2.50 cm 1.39 cm/m  RA Area:     17.20 cm LA Vol (A2C):   31.1 ml 17.31 ml/m RA Volume:   42.70 ml  23.76 ml/m LA Vol (A4C):   29.6 ml 16.47 ml/m LA Biplane Vol: 31.1 ml 17.31 ml/m  AORTIC VALVE LVOT Vmax:   77.40 cm/s LVOT Vmean:  52.100 cm/s LVOT VTI:    0.173 m AI PHT:      409 msec  AORTA Ao Root diam: 2.80 cm Ao Asc diam:  3.30 cm TRICUSPID VALVE TR Peak grad:   19.0 mmHg TR Vmax:        218.00 cm/s  SHUNTS Systemic VTI:  0.17 m Systemic Diam: 1.90 cm Jenne Campus MD Electronically signed by Jenne Campus MD Signature Date/Time: 11/01/2019/5:04:21 PM    Final      Assessment & Plan:  Plan    No orders of the defined types were placed in this encounter.   Problem List Items Addressed This Visit     Controlled type 2 diabetes with retinopathy ([place]) (Chronic)    hgba1c acceptable, minimize simple carbs. Increase exercise as tolerated. Continue current meds       Relevant Orders   Comprehensive metabolic panel (Completed)   TSH (Completed)   Hemoglobin A1c (Completed)   Microalbumin / creatinine urine ratio (Completed)   Hyperlipidemia - Primary    Encourage heart healthy diet  such as MIND or DASH diet, increase exercise, avoid trans fats, simple carbohydrates and processed foods, consider a krill or fish or flaxseed oil cap daily. Tolerating Rosuvastatin       Relevant Orders   Lipid panel (Completed)   TSH (Completed)   Osteopenia    Encouraged to get adequate exercise,  calcium and vitamin d intake       Anemia    Increase leafy greens, consider increased lean red meat and using cast iron cookware. Continue to monitor, report any concerns       Relevant Orders   CBC (Completed)   Acid reflux   Relevant Orders   CBC (Completed)   Other Visit Diagnoses     Need for pneumococcal vaccination       Relevant Orders   Pneumococcal polysaccharide vaccine 23-valent greater than or equal to 2yo subcutaneous/IM (Completed)       Follow-up: Return in about 6 months (around 09/29/2021) for annual, annual exam.  I, Suezanne Jacquet, acting as a scribe for Penni Homans, MD, have documented all relevent documentation on behalf of Penni Homans, MD, as directed by Penni Homans, MD while in the presence of Penni Homans, MD.  I, Mosie Lukes, MD personally performed the services described in this documentation. All medical record entries made by the scribe were at my direction and in my presence. I have reviewed the chart and agree that the record reflects my personal performance and is accurate and complete

## 2021-03-20 NOTE — Patient Instructions (Addendum)
  Paxlovid is the new COVID medication we can give you if you get COVID so make sure you test if you have symptoms because we have to treat by day 5 of symptoms for it to be effective. If you are positive let us know so we can treat. If a home test is negative and your symptoms are persistent get a PCR test. Can check testing locations at [email or web address] If you are positive we will make an appointment with [place] and we will send in Paxlovid if you would like it. Check with your pharmacy before we meet to confirm they have it in stock, if they do not then we can get the prescription at the [facility]    Encouraged increased hydration and fiber in diet. Daily probiotics. If bowels not moving can use MOM 2 tbls po in 4 oz of warm prune juice by mouth every 2-3 days. If no results then repeat in 4 hours with  Dulcolax suppository pr, may repeat again in 4 more hours as needed. Seek care if symptoms worsen. Consider daily Miralax and/or Dulcolax if symptoms persist.

## 2021-03-21 ENCOUNTER — Other Ambulatory Visit: Payer: Self-pay | Admitting: Family Medicine

## 2021-03-21 DIAGNOSIS — E785 Hyperlipidemia, unspecified: Secondary | ICD-10-CM

## 2021-03-22 ENCOUNTER — Encounter: Payer: Self-pay | Admitting: Family Medicine

## 2021-03-22 NOTE — Assessment & Plan Note (Signed)
Increase leafy greens, consider increased lean red meat and using cast iron cookware. Continue to monitor, report any concerns 

## 2021-03-22 NOTE — Assessment & Plan Note (Signed)
Encourage heart healthy diet such as MIND or DASH diet, increase exercise, avoid trans fats, simple carbohydrates and processed foods, consider a krill or fish or flaxseed oil cap daily.  Tolerating Rosuvastatin 

## 2021-03-22 NOTE — Assessment & Plan Note (Signed)
hgba1c acceptable, minimize simple carbs. Increase exercise as tolerated. Continue current meds 

## 2021-03-22 NOTE — Assessment & Plan Note (Signed)
Encouraged to get adequate exercise, calcium and vitamin d intake 

## 2021-03-25 ENCOUNTER — Telehealth: Payer: Self-pay | Admitting: Family Medicine

## 2021-03-25 NOTE — Telephone Encounter (Signed)
Copied from [place] [id number]. Topic: Medicare AWV >> Mar 25, 2021  2:18 PM Harris-Coley, Hannah Beat wrote: Reason for CRM: Left message for patient to schedule Annual Wellness Visit.  Please schedule with Health Nurse Advisor Augustine Radar. at [facility].

## 2021-03-26 ENCOUNTER — Other Ambulatory Visit: Payer: Self-pay | Admitting: Family Medicine

## 2021-03-26 DIAGNOSIS — E785 Hyperlipidemia, unspecified: Secondary | ICD-10-CM

## 2021-03-29 ENCOUNTER — Encounter: Payer: Self-pay | Admitting: Family Medicine

## 2021-03-31 ENCOUNTER — Ambulatory Visit: Payer: Medicare HMO | Admitting: Physical Therapy

## 2021-03-31 ENCOUNTER — Other Ambulatory Visit: Payer: Self-pay

## 2021-03-31 ENCOUNTER — Encounter: Payer: Self-pay | Admitting: Physical Therapy

## 2021-03-31 DIAGNOSIS — R2689 Other abnormalities of gait and mobility: Secondary | ICD-10-CM

## 2021-03-31 DIAGNOSIS — M25651 Stiffness of right hip, not elsewhere classified: Secondary | ICD-10-CM | POA: Diagnosis not present

## 2021-03-31 DIAGNOSIS — M25551 Pain in right hip: Secondary | ICD-10-CM

## 2021-03-31 DIAGNOSIS — M6281 Muscle weakness (generalized): Secondary | ICD-10-CM | POA: Diagnosis not present

## 2021-03-31 DIAGNOSIS — G8929 Other chronic pain: Secondary | ICD-10-CM

## 2021-03-31 DIAGNOSIS — M62830 Muscle spasm of back: Secondary | ICD-10-CM

## 2021-03-31 DIAGNOSIS — E785 Hyperlipidemia, unspecified: Secondary | ICD-10-CM

## 2021-03-31 DIAGNOSIS — M545 Low back pain, unspecified: Secondary | ICD-10-CM | POA: Diagnosis not present

## 2021-03-31 MED ORDER — ROSUVASTATIN CALCIUM 5 MG PO TABS: ORAL_TABLET | ORAL | 1 refills | Status: DC

## 2021-03-31 NOTE — Therapy (Signed)
[place]. [place], [place], [address] Phone: [phone]   Fax:  [phone]  Physical Therapy Treatment  Patient Details  Name: Destiny Romero MRN: [id number] Date of Birth: May 07, 1951 Referring Provider (PT): Dr Susa Day   Encounter Date: 03/31/2021   PT End of Session - 03/31/21 1647     Visit Number 6    Date for PT Re-Evaluation 05/01/21    PT Start Time 1600    PT Stop Time 1644    PT Time Calculation (min) 44 min    Activity Tolerance Patient tolerated treatment well    Behavior During Therapy [facility] for tasks assessed/performed             Past Medical History:  Diagnosis Date   Allergy    Anal lesion 2007   Anemia    Anesthesia complication    Per pt/ sensitive to sedation!   Central retinal artery occlusion 06/08/2016   limited vision right eye.   Cervical dysplasia 04/1991   Dr Rica Koyanagi - CIN I, cone and freeze in 1990s with normal paps since   Constipation 06/08/2016   moves bowels every 3 rd day    Decreased visual acuity 06/08/2016   Retinal bleed right eye 2017   Depression    Fatigue 06/27/2017   H/O measles    H/O mumps    History of chicken pox    Mitral valve prolapse    NO MR; no SBE prophylaxis needed   PONV (postoperative nausea and vomiting)    Pre-diabetes    highest A1c 6.4 %   Preventative health care 12/21/2016   Squamous cell carcinoma in situ of skin    Dr Syble Creek   Vision impairment    limited vision in right eye.    Past Surgical History:  Procedure Laterality Date   CERVICAL CONE BIOPSY     COLONOSCOPY  2003   COLONOSCOPY  04/01/2011   [place] - repeat in 5 years   COLONOSCOPY W/ POLYPECTOMY  2007   Dr Earlean Shawl   EYE SURGERY Bilateral    cataracts   [place] Left 12/2011   Dr Ubaldo Glassing ; ovarian cyst   RECTAL SURGERY  05/28/06   pre-canerous lesion removed in 2007; Dr Morton Stall, [facility] Perch   rt foot morton's surgery     TUBAL LIGATION       There were no vitals filed for this visit.   Subjective Assessment - 03/31/21 1603     Subjective Pt reports doing well overall. Still limited in the amount of time she can stand throughout the day.    Currently in Pain? Yes    Pain Score 2     Pain Location Back                               [facility] Adult PT Treatment/Exercise - 03/31/21 0001       Lumbar Exercises: Stretches   Piriformis Stretch 2 reps;20 seconds;Right;Left    Piriformis Stretch Limitations seated    Other Lumbar Stretch Exercise seated fwd lumbar flexion x5 5sec hold      Lumbar Exercises: Aerobic   Recumbent Bike L3 x 6 min      Lumbar Exercises: Machines for Strengthening   Cybex Knee Extension 10# 2x10    Cybex Knee Flexion 25# 2x10    Other Lumbar Machine Exercise standing shoulder  ext 10# 2x10      Lumbar Exercises: Standing   Heel Raises 15 reps      Lumbar Exercises: Supine   Bridge with Ball Squeeze 10 reps;2 seconds    Bridge with clamshell 10 reps;2 seconds    Bridge with [employer] Limitations green tb    Other Supine Lumbar Exercises dktc, ltr, small bridges with iso abs on pball x15                      PT Short Term Goals - 03/31/21 1605       PT SHORT TERM GOAL #1   Title Independent with initial HEP    Time 2    Period Weeks    Status Achieved               PT Long Term Goals - 03/31/21 1606       PT LONG TERM GOAL #1   Title Independent with advanced HEP    Time 8    Period Weeks    Status Achieved      PT LONG TERM GOAL #2   Title </= 2/10 pain with ADLs    Baseline rates 3-4/10 with ADLs    Time 8    Period Weeks    Status Partially Met      PT LONG TERM GOAL #3   Title Report tolerance to walking long distance and walking for prolonged time with </= 2/10 pain    Baseline reports still limited in distance by LBP but improved since time of eval    Time 8    Period Weeks    Status Partially Met      PT LONG TERM  GOAL #4   Title improve BLE strength to at least 4+ to 5/5    Time 8    Period Weeks    Status Partially Met                   Plan - 03/31/21 1648     Clinical Impression Statement Pt requests one more check in prior to discharge. She is reporting continued improvement in LBP overall with improved endurance and tolerance to exercise. Pt reporting that she still cannot stand/exercise for >30 min at a time before LB begins to hurt again. Would like to continue working on [employer] ex's and water aerobics and follow up for final assessment and HEP next rx.    PT Treatment/Interventions ADLs/Self Care Home Management;Cryotherapy;Electrical Stimulation;Iontophoresis 4mg /ml Dexamethasone;Moist Heat;Gait training;Neuromuscular re-education;Balance training;Therapeutic exercise;Therapeutic activities;Functional mobility training;Patient/family education;Manual techniques;Taping;Vasopneumatic Device;Energy conservation;Dry needling;Spinal Manipulations;Joint Manipulations;Stair training;Traction    PT Next Visit Plan Reassess HEP and progress as tolerated. Gradually progress TE with focus on endurance, proximal core/hip strengthening, and narrow base balance as tolerated. Manual and modalities as needed. likely d/c next rx.    Consulted and Agree with Plan of Care Patient             Patient will benefit from skilled therapeutic intervention in order to improve the following deficits and impairments:  Abnormal gait, Decreased range of motion, Increased muscle spasms, Pain, Decreased balance, Decreased mobility, Decreased strength, Improper body mechanics, Impaired flexibility  Visit Diagnosis: Muscle weakness (generalized)  Chronic low back pain, unspecified back pain laterality, unspecified whether sciatica present  Other abnormalities of gait and mobility  Muscle spasm of back  Pain in right hip  Stiffness of right hip, not elsewhere classified     Problem List Patient Active  Problem List   Diagnosis Date Noted   Arthritis 11/10/2020   Bilateral hand pain 03/14/2020   Toe pain, right 03/14/2020   Acid reflux 11/27/2019   SOB (shortness of breath) 11/07/2018   Atypical chest pain 11/07/2018   Overweight 11/07/2018   Encounter for PPD test 06/24/2018   Dupuytren's contracture of both hands 05/30/2018   Acquired trigger finger of both ring fingers 05/30/2018   Hand pain, left 05/09/2018   Urinary frequency 06/27/2017   Fall 06/27/2017   Fatigue 06/27/2017   Preventative health care 12/21/2016   Constipation 06/08/2016   Decreased visual acuity 06/08/2016   History of chicken pox    Anemia    Controlled type 2 diabetes with retinopathy ([place]) 08/11/2013   Osteopenia 08/19/2012   NEOPLASM, MALIGNANT, RECTUM 06/27/2008   Hyperlipidemia 06/27/2008   SKIN CANCER, HX OF 06/27/2008   History of colonic polyps 06/27/2008   Depression with anxiety 01/19/2007   Amador Cunas, PT, DPT Donald Prose Jillyn Stacey 03/31/2021, 4:54 PM  [place]. [place], [place], [address] Phone: [phone]   Fax:  [phone]  Name: TEONNA COONAN MRN: [id number] Date of Birth: April 10, 1951

## 2021-04-14 ENCOUNTER — Ambulatory Visit: Payer: Medicare HMO | Attending: Specialist

## 2021-04-14 ENCOUNTER — Other Ambulatory Visit: Payer: Self-pay

## 2021-04-14 DIAGNOSIS — M62830 Muscle spasm of back: Secondary | ICD-10-CM

## 2021-04-14 DIAGNOSIS — M25551 Pain in right hip: Secondary | ICD-10-CM | POA: Diagnosis not present

## 2021-04-14 DIAGNOSIS — G8929 Other chronic pain: Secondary | ICD-10-CM | POA: Insufficient documentation

## 2021-04-14 DIAGNOSIS — R2689 Other abnormalities of gait and mobility: Secondary | ICD-10-CM

## 2021-04-14 DIAGNOSIS — M6281 Muscle weakness (generalized): Secondary | ICD-10-CM | POA: Diagnosis not present

## 2021-04-14 DIAGNOSIS — M545 Low back pain, unspecified: Secondary | ICD-10-CM | POA: Diagnosis not present

## 2021-04-14 DIAGNOSIS — M25651 Stiffness of right hip, not elsewhere classified: Secondary | ICD-10-CM

## 2021-04-14 NOTE — Therapy (Signed)
[place]. [place], [place], [address] Phone: [phone]   Fax:  [phone]  Physical Therapy Treatment/ Discharge summary  Patient Details  Name: Destiny Romero MRN: [id number] Date of Birth: [birth year] Referring Provider (PT): Dr Susa Day   Encounter Date: 04/14/2021   PT End of Session - 04/14/21 1048     Visit Number 7    Date for PT Re-Evaluation 05/01/21    PT Start Time 3300    PT Stop Time 1125    PT Time Calculation (min) 42 min    Activity Tolerance Patient tolerated treatment well    Behavior During Therapy [facility] for tasks assessed/performed             Past Medical History:  Diagnosis Date   Allergy    Anal lesion 2007   Anemia    Anesthesia complication    Per pt/ sensitive to sedation!   Central retinal artery occlusion 06/08/2016   limited vision right eye.   Cervical dysplasia 04/1991   Dr Rica Koyanagi - CIN I, cone and freeze in 1990s with normal paps since   Constipation 06/08/2016   moves bowels every 3 rd day    Decreased visual acuity 06/08/2016   Retinal bleed right eye 2017   Depression    Fatigue 06/27/2017   H/O measles    H/O mumps    History of chicken pox    Mitral valve prolapse    NO MR; no SBE prophylaxis needed   PONV (postoperative nausea and vomiting)    Pre-diabetes    highest A1c 6.4 %   Preventative health care 12/21/2016   Squamous cell carcinoma in situ of skin    Dr Syble Creek   Vision impairment    limited vision in right eye.    Past Surgical History:  Procedure Laterality Date   CERVICAL CONE BIOPSY     COLONOSCOPY  2003   COLONOSCOPY  04/01/2011   [place] - repeat in 5 years   COLONOSCOPY W/ POLYPECTOMY  2007   Dr Earlean Shawl   EYE SURGERY Bilateral    cataracts   [place] Left 12/2011   Dr Ubaldo Glassing ; ovarian cyst   RECTAL SURGERY  05/28/06   pre-canerous lesion removed in 2007; Dr Morton Stall, [facility] Perch   rt foot morton's surgery      TUBAL LIGATION      There were no vitals filed for this visit.   Subjective Assessment - 04/14/21 1046     Subjective Pt reports doing well overall. Standing on just one leg still is bothersome. Is going to be doing more independent exercise and would like today to be last visit, it is better than it was but just have to be careful    Currently in Pain? No/denies                               [facility] Adult PT Treatment/Exercise - 04/14/21 0001       Lumbar Exercises: Stretches   Piriformis Stretch 2 reps;20 seconds;Right;Left    Piriformis Stretch Limitations seated    Other Lumbar Stretch Exercise seated fwd lumbar flexion x5 5sec hold      Lumbar Exercises: Aerobic   Recumbent Bike L3 x 6 min      Lumbar Exercises: Machines for Strengthening   Cybex Knee Extension 10# 2x10    Cybex Knee  Flexion 25# 2x10            Lumbar Exercises: Supine   Bridge with Ball Squeeze 10 reps;2 seconds    Bridge with clamshell 10 reps;2 seconds    Bridge with [employer] Limitations green tb    Other Supine Lumbar Exercises dktc, ltr  x15              antirotation walkouts 10# x 5 each side. Did feel the back a bit more with this. Educated ongradual introduction of this if tolerated to work on dynamic core stab.   supine Leg ext with with core stab x 10 B alt HS stretch 20" x 2 B   PT Education - 04/14/21 1126     Education Details Updated discharge HEP Access Code: [email or web address]    Person(s) Educated Patient              PT Short Term Goals - 03/31/21 1605       PT SHORT TERM GOAL #1   Title Independent with initial HEP    Time 2    Period Weeks    Status Achieved               PT Long Term Goals - 03/31/21 1606       PT LONG TERM GOAL #1   Title Independent with advanced HEP    Time 8    Period Weeks    Status Achieved      PT LONG TERM GOAL #2   Title </= 2/10 pain with ADLs    Baseline rates 3-4/10 with ADLs    Time 8    Period  Weeks    Status Partially Met      PT LONG TERM GOAL #3   Title Report tolerance to walking long distance and walking for prolonged time with </= 2/10 pain    Baseline reports still limited in distance by LBP but improved since time of eval    Time 8    Period Weeks    Status Partially Met      PT LONG TERM GOAL #4   Title improve BLE strength to at least 4+ to 5/5    Time 8    Period Weeks    Status Partially Met                   Plan - 04/14/21 1049     Clinical Impression Statement Pt requests today be final session- discharge today. Overall LBP has improved and improved tolerance to exercise although she does still express limited standing/exercise/walking tolerance which does still tend to exacerbate the LBP. She would like to continue working on [employer] and [employer]. Updated HEP also provided today to continue working on core stab and mobility. Pt reverse demosntrated all exercises nicely with min initial cues.    PT Treatment/Interventions ADLs/Self Care Home Management;Cryotherapy;Electrical Stimulation;Iontophoresis 75m/ml Dexamethasone;Moist Heat;Gait training;Neuromuscular re-education;Balance training;Therapeutic exercise;Therapeutic activities;Functional mobility training;Patient/family education;Manual techniques;Taping;Vasopneumatic Device;Energy conservation;Dry needling;Spinal Manipulations;Joint Manipulations;Stair training;Traction    PT Next Visit Plan D/C to HEP    Consulted and Agree with Plan of Care Patient             Patient will benefit from skilled therapeutic intervention in order to improve the following deficits and impairments:  Abnormal gait, Decreased range of motion, Increased muscle spasms, Pain, Decreased balance, Decreased mobility, Decreased strength, Improper body mechanics, Impaired flexibility  Visit Diagnosis: Muscle weakness (generalized)  Other abnormalities of gait and  mobility  Chronic low back pain,  unspecified back pain laterality, unspecified whether sciatica present  Muscle spasm of back  Pain in right hip  Stiffness of right hip, not elsewhere classified     Problem List Patient Active Problem List   Diagnosis Date Noted   Arthritis 11/10/2020   Bilateral hand pain 03/14/2020   Toe pain, right 03/14/2020   Acid reflux 11/27/2019   SOB (shortness of breath) 11/07/2018   Atypical chest pain 11/07/2018   Overweight 11/07/2018   Encounter for PPD test 06/24/2018   Dupuytren's contracture of both hands 05/30/2018   Acquired trigger finger of both ring fingers 05/30/2018   Hand pain, left 05/09/2018   Urinary frequency 06/27/2017   Fall 06/27/2017   Fatigue 06/27/2017   Preventative health care 12/21/2016   Constipation 06/08/2016   Decreased visual acuity 06/08/2016   History of chicken pox    Anemia    Controlled type 2 diabetes with retinopathy ([place]) 08/11/2013   Osteopenia 08/19/2012   NEOPLASM, MALIGNANT, RECTUM 06/27/2008   Hyperlipidemia 06/27/2008   SKIN CANCER, HX OF 06/27/2008   History of colonic polyps 06/27/2008   Depression with anxiety 01/19/2007   PHYSICAL THERAPY DISCHARGE SUMMARY  Visits from Start of Care: 7   Plan: Patient agrees to discharge.  Patient goals were  partially met. Patient is being discharged due to being pleased with current functional status and would like to continue with independent exercise.     Hall Busing, PT, DPT 04/14/2021, 11:27 AM  [place]. [place], [place], [address] Phone: [phone]   Fax:  [phone]  Name: Destiny Romero MRN: [id number] Date of Birth: 07-06-1951

## 2021-04-14 NOTE — Patient Instructions (Signed)
Access Code: [email or web address] URL: [email or web address] Date: 04/14/2021 Prepared by: Sherlynn Stalls  Exercises Standing Hip Abduction with Counter Support - 1 x daily - 5 x weekly - 2 sets - 10 reps Seated Marching with Opposite Shoulder Flexion - 1 x daily - 5 x weekly - 2 sets - 10 reps Supine Lower Trunk Rotation - 1 x daily - 7 x weekly - 1 sets - 10 reps Bent Knee Fallouts with Alternating Legs - 1 x daily - 7 x weekly - 2 sets - 10 reps Bridge with Hip Abduction and Resistance - 1 x daily - 7 x weekly - 2 sets - 10 reps Supine bridge with ball or pillow squeeze - 1 x daily - 7 x weekly - 2 sets - 10 reps Supine Transversus Abdominis Bracing with Leg Extension - 1 x daily - 7 x weekly - 3 sets - 10 reps Anti-Rotation Lateral Stepping with Press - 1 x daily - 7 x weekly - 2-3 sets - 5 reps Seated Flexion Stretch with Swiss Ball - 1 x daily - 7 x weekly - 10 reps - 5-10 seconds hold Seated Piriformis Stretch with Trunk Bend - 1 x daily - 7 x weekly - 4 sets - 20 sec hold

## 2021-04-28 DIAGNOSIS — L570 Actinic keratosis: Secondary | ICD-10-CM | POA: Diagnosis not present

## 2021-04-28 DIAGNOSIS — Z85828 Personal history of other malignant neoplasm of skin: Secondary | ICD-10-CM | POA: Diagnosis not present

## 2021-04-28 DIAGNOSIS — D1801 Hemangioma of skin and subcutaneous tissue: Secondary | ICD-10-CM | POA: Diagnosis not present

## 2021-04-28 DIAGNOSIS — D485 Neoplasm of uncertain behavior of skin: Secondary | ICD-10-CM | POA: Diagnosis not present

## 2021-04-28 DIAGNOSIS — L821 Other seborrheic keratosis: Secondary | ICD-10-CM | POA: Diagnosis not present

## 2021-04-28 DIAGNOSIS — D2272 Melanocytic nevi of left lower limb, including hip: Secondary | ICD-10-CM | POA: Diagnosis not present

## 2021-04-28 DIAGNOSIS — L814 Other melanin hyperpigmentation: Secondary | ICD-10-CM | POA: Diagnosis not present

## 2021-05-16 ENCOUNTER — Telehealth: Payer: Self-pay | Admitting: Family Medicine

## 2021-05-16 NOTE — Telephone Encounter (Signed)
Pt called [facility] requesting TOC to Dr. Gena Fray due to closer proximity to her home. Please advise.

## 2021-05-27 ENCOUNTER — Ambulatory Visit: Payer: Medicare HMO | Admitting: Family Medicine

## 2021-06-17 ENCOUNTER — Other Ambulatory Visit: Payer: Self-pay | Admitting: Family Medicine

## 2021-06-25 ENCOUNTER — Other Ambulatory Visit: Payer: Self-pay | Admitting: Family Medicine

## 2021-07-03 ENCOUNTER — Ambulatory Visit: Payer: Medicare HMO | Admitting: Family Medicine

## 2021-07-07 DIAGNOSIS — R69 Illness, unspecified: Secondary | ICD-10-CM | POA: Diagnosis not present

## 2021-07-07 DIAGNOSIS — F4310 Post-traumatic stress disorder, unspecified: Secondary | ICD-10-CM | POA: Diagnosis not present

## 2021-07-07 DIAGNOSIS — F339 Major depressive disorder, recurrent, unspecified: Secondary | ICD-10-CM | POA: Diagnosis not present

## 2021-08-05 DIAGNOSIS — Z1231 Encounter for screening mammogram for malignant neoplasm of breast: Secondary | ICD-10-CM | POA: Diagnosis not present

## 2021-08-08 ENCOUNTER — Encounter: Payer: Self-pay | Admitting: Obstetrics and Gynecology

## 2021-08-15 ENCOUNTER — Encounter: Payer: Self-pay | Admitting: Family Medicine

## 2021-08-15 ENCOUNTER — Other Ambulatory Visit: Payer: Self-pay

## 2021-08-15 ENCOUNTER — Ambulatory Visit (INDEPENDENT_AMBULATORY_CARE_PROVIDER_SITE_OTHER): Payer: Medicare HMO | Admitting: Family Medicine

## 2021-08-15 VITALS — BP 126/78 | HR 70 | Temp 97.0°F | Ht 64.0 in | Wt 173.4 lb

## 2021-08-15 DIAGNOSIS — Z860100 Personal history of colon polyps, unspecified: Secondary | ICD-10-CM

## 2021-08-15 DIAGNOSIS — R5383 Other fatigue: Secondary | ICD-10-CM | POA: Diagnosis not present

## 2021-08-15 DIAGNOSIS — E785 Hyperlipidemia, unspecified: Secondary | ICD-10-CM

## 2021-08-15 DIAGNOSIS — M19042 Primary osteoarthritis, left hand: Secondary | ICD-10-CM

## 2021-08-15 DIAGNOSIS — Z23 Encounter for immunization: Secondary | ICD-10-CM | POA: Diagnosis not present

## 2021-08-15 DIAGNOSIS — M19041 Primary osteoarthritis, right hand: Secondary | ICD-10-CM | POA: Diagnosis not present

## 2021-08-15 DIAGNOSIS — D649 Anemia, unspecified: Secondary | ICD-10-CM | POA: Diagnosis not present

## 2021-08-15 DIAGNOSIS — F418 Other specified anxiety disorders: Secondary | ICD-10-CM

## 2021-08-15 DIAGNOSIS — Z6830 Body mass index (BMI) 30.0-30.9, adult: Secondary | ICD-10-CM | POA: Insufficient documentation

## 2021-08-15 DIAGNOSIS — M8580 Other specified disorders of bone density and structure, unspecified site: Secondary | ICD-10-CM | POA: Diagnosis not present

## 2021-08-15 DIAGNOSIS — E11319 Type 2 diabetes mellitus with unspecified diabetic retinopathy without macular edema: Secondary | ICD-10-CM | POA: Diagnosis not present

## 2021-08-15 DIAGNOSIS — R69 Illness, unspecified: Secondary | ICD-10-CM | POA: Diagnosis not present

## 2021-08-15 NOTE — Progress Notes (Signed)
[facility] [address] [place] [address] Dept: [phone] Dept Fax: [phone]  Transfer of Care Office Visit  Subjective:    Patient ID: Destiny Romero, female    DOB: 02/28/1951, 70 y.o..   MRN: [id number]  Chief Complaint  Patient presents with   Establish Care    [facility]- establish care. C/o weight gain and sugars.  will get flu shot at pharmacy.     History of Present Illness:  Patient is in today to establish care. Ms. Brutus is from [place], [place]. She attended [employer] in [place] education. She moved to [place] in 1978. She was married three times, but is currently single. She has no children. 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 She denies use of tobacco or drugs and only drinks very occasionally.  Ms. Tucciarone has a history of a pre-cancerous rectal lesion which was excised in 2007. She is due for a repeat colonoscopy in Jan.  Ms. Metheny has  history of GERD. This is managed with Pepcid.  Ms. Perfecto has a history of type 2 diabetes. She is currently managed on glimepiride. She was previously on metformin, but had issues with dizziness. She has some extensive history of eye disease, though apparently not all related to diabetes.  Ms. Yogi has a history of anemia and is on a daily iron supplement.  Ms. Towson has a history of mixed depression and anxiety. She is currently managed on lamotrigine and feels that her mood has been stable overall.  Ms. Stanhope has a history of hyperlipidemia and is manage don rosuvastatin and Lovaza (omega-3 fatty acid)  Ms. Cyphers has osteoarthritis, esp. impacting her hands and feet. She has had some deformity and has a a small cyst at her right 2nd DIP joint.  Past Medical History: Patient Active Problem List   Diagnosis Date Noted   Degenerative lumbar spinal  stenosis 03/03/2021   Lumbar radiculopathy 01/22/2021   Osteoarthritis of both hands 11/10/2020   Gastroesophageal reflux disease 11/27/2019   Atypical chest pain 11/07/2018   BMI 29.0-29.9,adult 11/07/2018   Dyspnea 11/07/2018   Dupuytren's contracture of both hands 05/30/2018   Constipation 06/08/2016   Decreased visual acuity 06/08/2016   Anemia    Controlled type 2 diabetes with retinopathy ([place]) 08/11/2013   Osteopenia 08/19/2012   Hyperlipidemia 06/27/2008   History of colonic polyps 06/27/2008   Malignant tumor of rectum ([place]) 06/27/2008   Depression with anxiety 01/19/2007    Past Surgical History:  Procedure Laterality Date   CARPAL TUNNEL RELEASE Right 05/2020   CATARACT EXTRACTION, BILATERAL Bilateral    cataracts   CERVICAL CONE BIOPSY     COLONOSCOPY  2003   COLONOSCOPY  04/01/2011   [place] - repeat in 5 years   COLONOSCOPY W/ POLYPECTOMY  2007   Dr Earlean Shawl   EXCISION MORTON'S NEUROMA Right    LAMINECTOMY     1982   OOPHORECTOMY Left 12/2011   Dr Ubaldo Glassing ; ovarian cyst   RECTAL SURGERY  05/28/2006   pre-canerous lesion removed in 2007; Dr Morton Stall, [place]   TUBAL LIGATION      Family History  Problem Relation Age of Onset   Diabetes Mother    Dementia Mother    Prostate cancer Father    COPD Father    Heart disease Father        CAD - used nitroglycerin  tablets   Diabetes Sister        obese   Thyroid cancer Sister    Diabetes Brother        not obese   Lymphoma Brother        NHL   Cancer Brother        non Hodgkin's Lymphoma   Diabetes Maternal Grandmother        IDDM   Diabetes Paternal Grandmother        IDDM   Heart disease Paternal Grandmother    Diabetes Maternal Aunt        X5 ; all IDDM   Heart disease Maternal Aunt        "enlarged heart"   Heart disease Maternal Uncle        "enlarged heart"   Heart disease Paternal Grandfather        CAD - used nitroglycerin tablets   Diabetes Brother    Colon cancer Neg Hx    Esophageal cancer  Neg Hx    Stomach cancer Neg Hx    Stroke Neg Hx     Outpatient Medications Prior to Visit  Medication Sig Dispense Refill   Calcium 200 MG TABS Take by mouth. Tale [phone] mg daily     cetirizine (ZYRTEC) 10 MG tablet Take 10 mg by mouth daily.     Cholecalciferol (VITAMIN D) 2000 units CAPS      famotidine (PEPCID) 40 MG tablet TAKE 1 TABLET BY MOUTH EVERYDAY AT BEDTIME 90 tablet 1   FERROCITE 324 MG TABS tablet TAKE 1 TABLET (106 MG OF IRON TOTAL) BY MOUTH DAILY. 90 tablet 1   glimepiride (AMARYL) 1 MG tablet TAKE 1 TABLET BY MOUTH TWO A DAY WITH A MEAL 180 tablet 1   Glucosamine-Chondroitin 1500-1200 MG/30ML LIQD Glucosamine Chondroitin     lamoTRIgine (LAMICTAL) 200 MG tablet Take 200 mg by mouth daily.     melatonin 3 MG TABS tablet 1 tablet at bedtime as needed     Multiple Vitamin (MULTI VITAMIN) TABS 1 tablet     omega-3 acid ethyl esters (LOVAZA) 1 g capsule Take by mouth daily.     OneTouch Delica Lancets 38L MISC Use to test blood sugar 3X daily.  Dx Code: E11.65 300 each 1   ONETOUCH VERIO test strip USE TO TEST BLOOD SUGAR 3X DAILY. DX CODE: E11.65 300 strip 12   rosuvastatin (CRESTOR) 5 MG tablet TAKE 1 TABLET (5 MG TOTAL) BY MOUTH 2 (TWO) TIMES A WEEK. 24 tablet 1   MOBIC 7.5 MG tablet Take 7.5 mg by mouth daily.     No facility-administered medications prior to visit.    Allergies  Allergen Reactions   Cortisone Palpitations, Other (See Comments) and Nausea And Vomiting    hyperactivity hyperactivity   Pseudoephedrine Other (See Comments)    tachycardia tachycardia   Atorvastatin Other (See Comments)    Low dose   Codeine    Fluoxetine Hcl Other (See Comments)   Metformin Other (See Comments)    04/15/15 caused dizziness   Metformin And Related     04/15/15 caused dizziness   Shellfish Allergy Other (See Comments)    [other]      Objective:   Today's Vitals   08/15/21 1356  BP: 126/78  Pulse: 70  Temp: (!) 97 F (36.1 C)  TempSrc: Temporal  SpO2:  97%  Weight: 173 lb 6.4 oz (78.7 kg)  Height: 5\' 4"  (1.626 m)   Body mass index is 29.76  kg/m.   General: Well developed, well nourished. No acute distress. Extremities: There are some deformity tot he fingers with nodular changes around the PIP and DIP joints.   There is an apparent mucoid cyst overlyign the right 2nd DIP. Feet: Skin intact. DP and PT pulses 2+. 5.07 monofilament testing normal. Psych: Alert and oriented x3. Normal mood and affect.  Health Maintenance Due  Topic Date Due   COVID-19 Vaccine (3 - Pfizer risk series) 12/29/2019     Lab [place]: Lab Results  Component Value Date   HGBA1C 7.0 (H) 03/20/2021   Lab Results  Component Value Date   CHOL 204 (H) 03/20/2021   HDL 60.80 03/20/2021   LDLCALC 110 (H) 03/20/2021   LDLDIRECT 146.5 08/11/2013   TRIG 163.0 (H) 03/20/2021   CHOLHDL 3 03/20/2021   Assessment & Plan:   1. Controlled type 2 diabetes mellitus with right eye affected by retinopathy without macular edema, without long-term current use of insulin, unspecified retinopathy severity ([place]) Due for quarterly diabetes labs. Prior A1cs are at goal. Continue glimepiride.  - Glucose, random - Hemoglobin A1c  2. Osteopenia, unspecified location History of osteopenia. Due for repeat DXA scan. She will continue supplemental calcium and Vitamin D.  - DG Bone Density; Future  3. Primary osteoarthritis of both hands Discussed use of heat, esp. a paraffin bath to help with discomfort. If issues worsen, she might consider a return to her hand surgeon.  4. Hyperlipidemia, unspecified hyperlipidemia type Last lipids were mildly above goal. I will continue her rosuvastatin and plan to check fasting lipids at her next visit.  5. Depression with anxiety Stable on lamotrigine.  6. History of colonic polyps  - Ambulatory referral to Gastroenterology  7. Anemia, unspecified type History of anemia and iron deficiency. I will reassess her CBC.  - CBC  8.  Other fatigue  - TSH  Haydee Salter, MD

## 2021-08-21 ENCOUNTER — Encounter: Payer: Self-pay | Admitting: Family Medicine

## 2021-08-25 ENCOUNTER — Other Ambulatory Visit: Payer: Self-pay

## 2021-08-25 DIAGNOSIS — D649 Anemia, unspecified: Secondary | ICD-10-CM

## 2021-08-25 DIAGNOSIS — E11319 Type 2 diabetes mellitus with unspecified diabetic retinopathy without macular edema: Secondary | ICD-10-CM

## 2021-08-25 DIAGNOSIS — R5383 Other fatigue: Secondary | ICD-10-CM

## 2021-08-26 ENCOUNTER — Other Ambulatory Visit: Payer: Medicare HMO

## 2021-08-27 ENCOUNTER — Other Ambulatory Visit (INDEPENDENT_AMBULATORY_CARE_PROVIDER_SITE_OTHER): Payer: Medicare HMO

## 2021-08-27 ENCOUNTER — Other Ambulatory Visit: Payer: Self-pay

## 2021-08-27 DIAGNOSIS — D649 Anemia, unspecified: Secondary | ICD-10-CM

## 2021-08-27 DIAGNOSIS — E11319 Type 2 diabetes mellitus with unspecified diabetic retinopathy without macular edema: Secondary | ICD-10-CM | POA: Diagnosis not present

## 2021-08-27 DIAGNOSIS — R5383 Other fatigue: Secondary | ICD-10-CM | POA: Diagnosis not present

## 2021-08-27 LAB — CBC
HCT: 37 % (ref 36.0–46.0)
Hemoglobin: 12.2 g/dL (ref 12.0–15.0)
MCHC: 33.1 g/dL (ref 30.0–36.0)
MCV: 92 fl (ref 78.0–100.0)
Platelets: 261 10*3/uL (ref 150.0–400.0)
RBC: 4.02 Mil/uL (ref 3.87–5.11)
RDW: 13.5 % (ref 11.5–15.5)
WBC: 6.9 10*3/uL (ref 4.0–10.5)

## 2021-08-27 LAB — HEMOGLOBIN A1C: Hgb A1c MFr Bld: 7.1 % — ABNORMAL HIGH (ref 4.6–6.5)

## 2021-08-27 LAB — TSH: TSH: 2.02 u[IU]/mL (ref 0.35–5.50)

## 2021-08-27 LAB — GLUCOSE, RANDOM: Glucose, Bld: 139 mg/dL — ABNORMAL HIGH (ref 70–99)

## 2021-08-27 NOTE — Progress Notes (Signed)
Per the orders of Dr. Gena Fray pt is here for labs pt tolerated draw well.

## 2021-09-01 ENCOUNTER — Encounter: Payer: Self-pay | Admitting: Family Medicine

## 2021-09-03 ENCOUNTER — Encounter: Payer: Self-pay | Admitting: Family

## 2021-09-03 ENCOUNTER — Telehealth (INDEPENDENT_AMBULATORY_CARE_PROVIDER_SITE_OTHER): Payer: Medicare HMO | Admitting: Family

## 2021-09-03 VITALS — Temp 98.1°F | Ht 64.0 in | Wt 173.0 lb

## 2021-09-03 DIAGNOSIS — R5383 Other fatigue: Secondary | ICD-10-CM

## 2021-09-03 DIAGNOSIS — M791 Myalgia, unspecified site: Secondary | ICD-10-CM

## 2021-09-03 DIAGNOSIS — U071 COVID-19: Secondary | ICD-10-CM

## 2021-09-03 MED ORDER — MOLNUPIRAVIR EUA 200MG CAPSULE: 4.0000 | ORAL_CAPSULE | Freq: Two times a day (BID) | ORAL | 0 refills | Status: AC

## 2021-09-03 MED ORDER — COVID-19 ANTIGEN TEST VI KIT: 1.0000 "application " | PACK | Freq: Every day | 0 refills | Status: DC | PRN

## 2021-09-03 NOTE — Progress Notes (Signed)
Virtual Visit via Video   I connected with patient on 09/03/21 at  8:00 AM EST by a video enabled telemedicine application and verified that I am speaking with the correct person using two identifiers.  Location patient: Home Location provider: [employer], Office Persons participating in the virtual visit: Patient, Provider, CMA   I discussed the limitations of evaluation and management by telemedicine and the availability of in person appointments. The patient expressed understanding and agreed to proceed.  Subjective:   HPI:   70 year old female, vaccinated against COVID and flu,  presents via video with concerns of headaches, body aches, fever, and decreased energy x 2 days. She admits to being exposed to COVID 1 week ago through a circle of friends for which 3 of the 5 have tested positive for COVID. She has been taking Tylenol. Feels better today but continues to have a decreased appetite. Rapid antigen test at home has been negative.   ROS:   See pertinent positives and negatives per HPI.  Patient Active Problem List   Diagnosis Date Noted   Degenerative lumbar spinal stenosis 03/03/2021   Lumbar radiculopathy 01/22/2021   Osteoarthritis of both hands 11/10/2020   Gastroesophageal reflux disease 11/27/2019   Atypical chest pain 11/07/2018   BMI 29.0-29.9,adult 11/07/2018   Dyspnea 11/07/2018   Dupuytren's contracture of both hands 05/30/2018   Constipation 06/08/2016   Decreased visual acuity 06/08/2016   Controlled type 2 diabetes with retinopathy (HCC) 08/11/2013   Osteopenia 08/19/2012   Hyperlipidemia 06/27/2008   History of colonic polyps 06/27/2008   Malignant tumor of rectum (HCC) 06/27/2008   Depression with anxiety 01/19/2007    Social History   Tobacco Use   Smoking status: Never   Smokeless tobacco: Never  Substance Use Topics   Alcohol use: Yes    Comment: glass wine once a month    Current Outpatient Medications:    Calcium 200  MG TABS, Take by mouth. Tale [phone] mg daily, Disp: , Rfl:    cetirizine (ZYRTEC) 10 MG tablet, Take 10 mg by mouth daily., Disp: , Rfl:    Cholecalciferol (VITAMIN D) 2000 units CAPS, , Disp: , Rfl:    COVID-19 Antigen Test KIT, 1 application by In Vitro route daily as needed., Disp: 4 kit, Rfl: 0   famotidine (PEPCID) 40 MG tablet, TAKE 1 TABLET BY MOUTH EVERYDAY AT BEDTIME, Disp: 90 tablet, Rfl: 1   FERROCITE 324 MG TABS tablet, TAKE 1 TABLET (106 MG OF IRON TOTAL) BY MOUTH DAILY., Disp: 90 tablet, Rfl: 1   glimepiride (AMARYL) 1 MG tablet, TAKE 1 TABLET BY MOUTH TWO A DAY WITH A MEAL, Disp: 180 tablet, Rfl: 1   Glucosamine-Chondroitin 1500-1200 MG/30ML LIQD, Glucosamine Chondroitin, Disp: , Rfl:    lamoTRIgine (LAMICTAL) 200 MG tablet, Take 200 mg by mouth daily., Disp: , Rfl:    melatonin 3 MG TABS tablet, 1 tablet at bedtime as needed, Disp: , Rfl:    molnupiravir EUA (LAGEVRIO) 200 mg CAPS capsule, Take 4 capsules (800 mg total) by mouth 2 (two) times daily for 5 days., Disp: 40 capsule, Rfl: 0   Multiple Vitamin (MULTI VITAMIN) TABS, 1 tablet, Disp: , Rfl:    omega-3 acid ethyl esters (LOVAZA) 1 g capsule, Take by mouth daily., Disp: , Rfl:    OneTouch Delica Lancets 33G MISC, Use to test blood sugar 3X daily.  Dx Code: E11.65, Disp: 300 each, Rfl: 1   ONETOUCH VERIO test strip, USE TO TEST  BLOOD SUGAR 3X DAILY. DX CODE: E11.65, Disp: 300 strip, Rfl: 12   rosuvastatin (CRESTOR) 5 MG tablet, TAKE 1 TABLET (5 MG TOTAL) BY MOUTH 2 (TWO) TIMES A WEEK., Disp: 24 tablet, Rfl: 1  Allergies  Allergen Reactions   Cortisone Palpitations, Other (See Comments) and Nausea And Vomiting    hyperactivity hyperactivity   Pseudoephedrine Other (See Comments)    tachycardia tachycardia   Atorvastatin Other (See Comments)    Low dose   Codeine    Fluoxetine Hcl Other (See Comments)   Metformin Other (See Comments)    04/15/15 caused dizziness   Metformin And Related     04/15/15 caused dizziness    Shellfish Allergy Other (See Comments)    [other]    Objective:   Temp 98.1 F (36.7 C) (Temporal)    Ht $R'5\' 4"'Ua$  (1.626 m)    Wt 173 lb (78.5 kg)    LMP 04/07/2001 (Approximate)    BMI 29.70 kg/m   Patient is well-developed, well-nourished in no acute distress.  Resting comfortably at home.  Head is normocephalic, atraumatic.  No labored breathing.  Speech is clear and coherent with logical content.  Patient is alert and oriented at baseline.    Assessment and Plan:  Destiny Romero was seen today for acute visit.  Diagnoses and all orders for this visit:  COVID-19  Other fatigue  Myalgia  Other orders -     molnupiravir EUA (LAGEVRIO) 200 mg CAPS capsule; Take 4 capsules (800 mg total) by mouth 2 (two) times daily for 5 days. -     COVID-19 Antigen Test KIT; 1 application by In Vitro route daily as needed.    Call the office if symptoms worsen or persist. Recheck as scheduled and sooner as needed. COVID precautions and isolation provided.   Kennyth Arnold, FNP 09/03/2021

## 2021-09-10 ENCOUNTER — Encounter: Payer: Self-pay | Admitting: Family Medicine

## 2021-09-10 DIAGNOSIS — E785 Hyperlipidemia, unspecified: Secondary | ICD-10-CM

## 2021-09-10 MED ORDER — ROSUVASTATIN CALCIUM 5 MG PO TABS: ORAL_TABLET | ORAL | 1 refills | Status: DC

## 2021-09-10 MED ORDER — FAMOTIDINE 40 MG PO TABS: ORAL_TABLET | ORAL | 1 refills | Status: DC

## 2021-09-15 ENCOUNTER — Encounter: Payer: Self-pay | Admitting: Gastroenterology

## 2021-09-17 ENCOUNTER — Encounter: Payer: Self-pay | Admitting: Gastroenterology

## 2021-09-22 ENCOUNTER — Other Ambulatory Visit: Payer: Self-pay | Admitting: Family Medicine

## 2021-09-22 DIAGNOSIS — E785 Hyperlipidemia, unspecified: Secondary | ICD-10-CM

## 2021-09-23 ENCOUNTER — Encounter: Payer: Medicare HMO | Admitting: Family Medicine

## 2021-09-30 ENCOUNTER — Other Ambulatory Visit: Payer: Self-pay

## 2021-09-30 ENCOUNTER — Ambulatory Visit (AMBULATORY_SURGERY_CENTER): Payer: Medicare HMO | Admitting: *Deleted

## 2021-09-30 VITALS — Ht 64.0 in | Wt 165.0 lb

## 2021-09-30 DIAGNOSIS — Z86010 Personal history of colonic polyps: Secondary | ICD-10-CM

## 2021-09-30 MED ORDER — PEG 3350-KCL-NA BICARB-NACL 420 G PO SOLR: 4000.0000 mL | Freq: Once | ORAL | 0 refills | Status: AC

## 2021-09-30 NOTE — Progress Notes (Signed)

## 2021-10-01 DIAGNOSIS — M85851 Other specified disorders of bone density and structure, right thigh: Secondary | ICD-10-CM | POA: Diagnosis not present

## 2021-10-01 DIAGNOSIS — Z780 Asymptomatic menopausal state: Secondary | ICD-10-CM | POA: Diagnosis not present

## 2021-10-01 DIAGNOSIS — M85852 Other specified disorders of bone density and structure, left thigh: Secondary | ICD-10-CM | POA: Diagnosis not present

## 2021-10-01 LAB — HM DEXA SCAN

## 2021-10-02 ENCOUNTER — Encounter: Payer: Self-pay | Admitting: Family Medicine

## 2021-10-06 ENCOUNTER — Encounter: Payer: Self-pay | Admitting: Family Medicine

## 2021-10-07 ENCOUNTER — Encounter: Payer: Self-pay | Admitting: Obstetrics and Gynecology

## 2021-10-14 ENCOUNTER — Encounter: Payer: Self-pay | Admitting: Gastroenterology

## 2021-10-17 ENCOUNTER — Encounter: Payer: Self-pay | Admitting: Gastroenterology

## 2021-10-17 ENCOUNTER — Ambulatory Visit (AMBULATORY_SURGERY_CENTER): Payer: Medicare HMO | Admitting: Gastroenterology

## 2021-10-17 VITALS — BP 118/72 | HR 56 | Temp 98.6°F | Resp 12 | Ht 64.0 in | Wt 165.0 lb

## 2021-10-17 DIAGNOSIS — Z86010 Personal history of colonic polyps: Secondary | ICD-10-CM

## 2021-10-17 MED ORDER — SODIUM CHLORIDE 0.9 % IV SOLN: 500.0000 mL | Freq: Once | INTRAVENOUS | Status: DC

## 2021-10-17 NOTE — Patient Instructions (Signed)
Information on hemorrhoids given to you today.  Resume previous diet and medications.  Repeat colonoscopy in 5 years for surveillance.   YOU HAD AN ENDOSCOPIC PROCEDURE TODAY AT [facility]:   Refer to the procedure report that was given to you for any specific questions about what was found during the examination.  If the procedure report does not answer your questions, please call your gastroenterologist to clarify.  If you requested that your care partner not be given the details of your procedure findings, then the procedure report has been included in a sealed envelope for you to review at your convenience later.  YOU SHOULD EXPECT: Some feelings of bloating in the abdomen. Passage of more gas than usual.  Walking can help get rid of the air that was put into your GI tract during the procedure and reduce the bloating. If you had a lower endoscopy (such as a colonoscopy or flexible sigmoidoscopy) you may notice spotting of blood in your stool or on the toilet paper. If you underwent a bowel prep for your procedure, you may not have a normal bowel movement for a few days.  Please Note:  You might notice some irritation and congestion in your nose or some drainage.  This is from the oxygen used during your procedure.  There is no need for concern and it should clear up in a day or so.  SYMPTOMS TO REPORT IMMEDIATELY:  Following lower endoscopy (colonoscopy or flexible sigmoidoscopy):  Excessive amounts of blood in the stool  Significant tenderness or worsening of abdominal pains  Swelling of the abdomen that is new, acute  Fever of 100F or higher  For urgent or emergent issues, a gastroenterologist can be reached at any hour by calling [phone]. Do not use MyChart messaging for urgent concerns.    DIET:  We do recommend a small meal at first, but then you may proceed to your regular diet.  Drink plenty of fluids but you should avoid alcoholic beverages for 24  hours.  ACTIVITY:  You should plan to take it easy for the rest of today and you should NOT DRIVE or use heavy machinery until tomorrow (because of the sedation medicines used during the test).    FOLLOW UP: Our staff will call the number listed on your records 48-72 hours following your procedure to check on you and address any questions or concerns that you may have regarding the information given to you following your procedure. If we do not reach you, we will leave a message.  We will attempt to reach you two times.  During this call, we will ask if you have developed any symptoms of COVID 19. If you develop any symptoms (ie: fever, flu-like symptoms, shortness of breath, cough etc.) before then, please call [phone].  If you test positive for Covid 19 in the 2 weeks post procedure, please call and report this information to [place].    If any biopsies were taken you will be contacted by phone or by letter within the next 1-3 weeks.  Please call us at [phone] if you have not heard about the biopsies in 3 weeks.    SIGNATURES/CONFIDENTIALITY: You and/or your care partner have signed paperwork which will be entered into your electronic medical record.  These signatures attest to the fact that that the information above on your After Visit Summary has been reviewed and is understood.  Full responsibility of the confidentiality of this discharge information lies with you  and/or your care-partner.  °

## 2021-10-17 NOTE — Progress Notes (Signed)
Pt's states no medical or surgical changes since previsit or office visit.  ° °VS DT °

## 2021-10-17 NOTE — Op Note (Signed)
[place] Patient Name: Destiny Romero Procedure Date: 10/17/2021 2:35 PM MRN: [id number] Endoscopist: Milus Banister , MD Age: 71 Referring MD:  Date of Birth: 1951/01/01 Gender: Female Account #: [id number] Procedure:                Colonoscopy Indications:              High risk colon cancer surveillance: Personal                            history of advanced anorectal neoplasm; ano-rectal                            neoplasm remotely (advanced polyp?), diagnosed Dr.                            Earlean Shawl and treated by Dr. Morton Stall several times                            prior to establishing with Dr. Ardis Hughs; Dr. Morton Stall                            told her she did not need to see him anymore;                            colonoscopy 2012 Dr. Ardis Hughs was normal. Colonoscopy                            09/2016 Dr. Ardis Hughs was normal Medicines:                Monitored Anesthesia Care Procedure:                Pre-Anesthesia Assessment:                           - Prior to the procedure, a History and Physical                            was performed, and patient medications and                            allergies were reviewed. The patient's tolerance of                            previous anesthesia was also reviewed. The risks                            and benefits of the procedure and the sedation                            options and risks were discussed with the patient.                            All questions were answered, and informed consent  was obtained. Prior Anticoagulants: The patient has                            taken no previous anticoagulant or antiplatelet                            agents. ASA Grade Assessment: II - A patient with                            mild systemic disease. After reviewing the risks                            and benefits, the patient was deemed in                            satisfactory condition to undergo the  procedure.                           After obtaining informed consent, the colonoscope                            was passed under direct vision. Throughout the                            procedure, the patient's blood pressure, pulse, and                            oxygen saturations were monitored continuously. The                            Olympus PCF-H190DL (#[id number]) Colonoscope was                            introduced through the anus and advanced to the the                            cecum, identified by appendiceal orifice and                            ileocecal valve. The colonoscopy was performed                            without difficulty. The patient tolerated the                            procedure well. The quality of the bowel                            preparation was good. The ileocecal valve,                            appendiceal orifice, and rectum were photographed. Scope In: 2:46:55 PM Scope Out: 2:59:46 PM Scope Withdrawal Time: 0 hours 8 minutes 55 seconds  Total Procedure Duration: 0  hours 12 minutes 51 seconds  Findings:                 External and internal hemorrhoids were found. The                            hemorrhoids were small.                           The exam was otherwise without abnormality on                            direct and retroflexion views. Complications:            No immediate complications. Estimated blood loss:                            None. Estimated Blood Loss:     Estimated blood loss: none. Impression:               - External and internal hemorrhoids.                           - The examination was otherwise normal on direct                            and retroflexion views.                           - No polyps or cancers. Recommendation:           - Patient has a contact number available for                            emergencies. The signs and symptoms of potential                            delayed complications were  discussed with the                            patient. Return to normal activities tomorrow.                            Written discharge instructions were provided to the                            patient.                           - Resume previous diet.                           - Continue present medications.                           - Repeat colonoscopy in 5 years for surveillance. Milus Banister, MD 10/17/2021 3:02:29 PM This report has been signed electronically.

## 2021-10-17 NOTE — Progress Notes (Signed)
ano-rectal neoplasm remotely, diagnosed Dr. Earlean Shawl and treated by Dr. Morton Stall several times prior to establishing with Dr. Ardis Hughs; Dr. Morton Stall told her she did not need to see him anymore; colonoscopy 2012 Dr. Ardis Hughs was normal. Colonoscopy 09/2016 Dr. Ardis Hughs was normal   HPI: This is a woman with history of anorectal neoplasm remotely   ROS: complete GI ROS as described in HPI, all other review negative.  Constitutional:  No unintentional weight loss   Past Medical History:  Diagnosis Date   Allergy    Anal lesion 2007   Anemia    Anesthesia complication    Per pt/ sensitive to sedation!   Arthritis    fingers,toes   Cancer ([place])    "PRECANCEROUS LESIONS IN RECTUM"   Cataract    bilateral,removed   Central retinal artery occlusion 06/08/2016   limited vision right eye.   Cervical dysplasia 04/1991   Dr Rica Koyanagi - CIN I, cone and freeze in 1990s with normal paps since   Constipation 06/08/2016   moves bowels every 3 rd day    Decreased visual acuity 06/08/2016   Retinal bleed right eye 2017   Depression    Diabetes mellitus without complication ([place])    Fatigue 06/27/2017   H/O measles    H/O mumps    History of chicken pox    Hyperlipidemia    Mitral valve prolapse    NO MR; no SBE prophylaxis needed   Osteoporosis    PONV (postoperative nausea and vomiting)    Pre-diabetes    highest A1c 6.4 %   Preventative health care 12/21/2016   Squamous cell carcinoma in situ of skin    Dr [place]   Vision impairment    limited vision in right eye.    Past Surgical History:  Procedure Laterality Date   CARPAL TUNNEL RELEASE Right 05/2020   CATARACT EXTRACTION, BILATERAL Bilateral    cataracts   CERVICAL CONE BIOPSY     COLONOSCOPY  2003   COLONOSCOPY  04/01/2011   [place] - repeat in 5 years   COLONOSCOPY W/ POLYPECTOMY  2007   Dr Earlean Shawl   EXCISION MORTON'S NEUROMA Right    LAMINECTOMY     1982   OOPHORECTOMY Left 12/2011   Dr Ubaldo Glassing ; ovarian cyst   POLYPECTOMY      RECTAL SURGERY  05/28/2006   pre-canerous lesion removed in 2007; Dr Morton Stall, [facility]   TUBAL LIGATION      Current Outpatient Medications  Medication Sig Dispense Refill   Calcium 200 MG TABS Take by mouth. Tale [phone] mg daily     cetirizine (ZYRTEC) 10 MG tablet Take 10 mg by mouth daily.     Cholecalciferol (VITAMIN D) 2000 units CAPS daily.     famotidine (PEPCID) 40 MG tablet TAKE 1 TABLET BY MOUTH EVERYDAY AT BEDTIME 90 tablet 1   FERROCITE 324 MG TABS tablet TAKE 1 TABLET (106 MG OF IRON TOTAL) BY MOUTH DAILY. 90 tablet 1   glimepiride (AMARYL) 1 MG tablet TAKE 1 TABLET BY MOUTH TWO A DAY WITH A MEAL 180 tablet 1   Glucosamine-Chondroitin 1500-1200 MG/30ML LIQD daily.     lamoTRIgine (LAMICTAL) 200 MG tablet Take 200 mg by mouth daily.     melatonin 3 MG TABS tablet 1 tablet at bedtime as needed     Multiple Vitamin (MULTI VITAMIN) TABS daily.     omega-3 acid ethyl esters (LOVAZA) 1 g capsule Take by mouth daily.     rosuvastatin (CRESTOR) 5  MG tablet TAKE 1 TABLET (5 MG TOTAL) BY MOUTH 2 (TWO) TIMES A WEEK. 24 tablet 1   COVID-19 Antigen Test KIT 1 application by In Vitro route daily as needed. (Patient not taking: Reported on 09/30/2021) 4 kit 0   OneTouch Delica Lancets 78G MISC Use to test blood sugar 3X daily.  Dx Code: E11.65 300 each 1   ONETOUCH VERIO test strip USE TO TEST BLOOD SUGAR 3X DAILY. DX CODE: E11.65 300 strip 12   Current Facility-Administered Medications  Medication Dose Route Frequency Provider Last Rate Last Admin   0.9 %  sodium chloride infusion  500 mL Intravenous Once Milus Banister, MD        Allergies as of 10/17/2021 - Review Complete 10/17/2021  Allergen Reaction Noted   Cortisone Palpitations, Other (See Comments), and Nausea And Vomiting 03/17/2011   Short ragweed pollen ext Itching 09/30/2021   Grass pollen(k-o-r-t-swt vern) Rash 09/30/2021   Other Other (See Comments), Hives, Itching, and Nausea Only 09/30/2021   Pseudoephedrine Other  (See Comments) 06/28/2012   Atorvastatin Other (See Comments) 05/11/2019   Codeine  03/21/2019   Fluoxetine hcl Other (See Comments) 03/21/2019   Metformin Other (See Comments) 04/15/2015   Metformin and related  04/15/2015   Shellfish allergy Other (See Comments) 04/24/2012    Family History  Problem Relation Age of Onset   Diabetes Mother    Dementia Mother    Prostate cancer Father    COPD Father    Heart disease Father        CAD - used nitroglycerin tablets   Diabetes Sister        obese   Thyroid cancer Sister    Diabetes Brother        not obese   Lymphoma Brother        NHL   Cancer Brother        non Hodgkin's Lymphoma   Diabetes Brother    Diabetes Maternal Aunt        X5 ; all IDDM   Heart disease Maternal Aunt        "enlarged heart"   Heart disease Maternal Uncle        "enlarged heart"   Diabetes Maternal Grandmother        IDDM   Diabetes Paternal Grandmother        IDDM   Heart disease Paternal Grandmother    Heart disease Paternal Grandfather        CAD - used nitroglycerin tablets   Colon cancer Neg Hx    Esophageal cancer Neg Hx    Stomach cancer Neg Hx    Stroke Neg Hx    Colon polyps Neg Hx    Rectal cancer Neg Hx     Social History   Socioeconomic History   Marital status: Single    Spouse name: Not on file   Number of children: 0   Years of education: Not on file   Highest education level: Not on file  Occupational History   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  Tobacco Use   Smoking status: Never   Smokeless tobacco: Never  Vaping Use   Vaping Use: Never used  Substance and Sexual Activity   Alcohol use: Yes    Comment: glass wine once a month   Drug use: No   Sexual activity: Never    Partners: Male    Birth control/protection: Abstinence  Other Topics Concern   Not on file  Social History Narrative  Lives alone   No dietary restrictions   Social Determinants of Health   Financial Resource Strain: Not on file   Food Insecurity: Not on file  Transportation Needs: Not on file  Physical Activity: Not on file  Stress: Not on file  Social Connections: Not on file  Intimate Partner Violence: Not on file     Physical Exam: BP 130/64    Pulse 64    Temp 98.6 F (37 C) (Temporal)    Ht 5' 4" (1.626 m)    Wt 165 lb (74.8 kg)    LMP 04/07/2001 (Approximate)    SpO2 97%    BMI 28.32 kg/m  Constitutional: generally well-appearing Psychiatric: alert and oriented x3 Lungs: CTA bilaterally Heart: no MCR  Assessment and plan: 71 y.o. female with h/o anorectal neoplasm, remotely  Surveillance colonoscopy today  Care is appropriate for the ambulatory setting.  Owens Loffler, MD [facility] 10/17/2021, 2:36 PM

## 2021-10-17 NOTE — Progress Notes (Signed)
A and O x3. Report to RN. Tolerated MAC anesthesia well.

## 2021-10-21 ENCOUNTER — Telehealth: Payer: Self-pay

## 2021-10-21 NOTE — Telephone Encounter (Signed)
°  Follow up Call-  Call back number 10/17/2021  Post procedure Call Back phone  # [phone]  Permission to leave phone message Yes  Some recent data might be hidden     Patient questions:  Do you have a fever, pain , or abdominal swelling? No. Pain Score  0 *  Have you tolerated food without any problems? Yes.    Have you been able to return to your normal activities? Yes.    Do you have any questions about your discharge instructions: Diet   No. Medications  No. Follow up visit  No.  Do you have questions or concerns about your Care? No.  Actions: * If pain score is 4 or above: No action needed, pain <4.  Have you developed a fever since your procedure? no  2.   Have you had an respiratory symptoms (SOB or cough) since your procedure? no  3.   Have you tested positive for COVID 19 since your procedure no  4.   Have you had any family members/close contacts diagnosed with the COVID 19 since your procedure?  no   If yes to any of these questions please route to Joylene John, RN and Joella Prince, RN

## 2021-11-11 ENCOUNTER — Other Ambulatory Visit: Payer: Self-pay

## 2021-11-13 ENCOUNTER — Ambulatory Visit (INDEPENDENT_AMBULATORY_CARE_PROVIDER_SITE_OTHER): Payer: Medicare HMO | Admitting: Family Medicine

## 2021-11-13 ENCOUNTER — Other Ambulatory Visit: Payer: Self-pay

## 2021-11-13 VITALS — BP 120/66 | HR 70 | Temp 97.6°F | Ht 64.0 in | Wt 176.2 lb

## 2021-11-13 DIAGNOSIS — E785 Hyperlipidemia, unspecified: Secondary | ICD-10-CM | POA: Diagnosis not present

## 2021-11-13 DIAGNOSIS — F418 Other specified anxiety disorders: Secondary | ICD-10-CM | POA: Diagnosis not present

## 2021-11-13 DIAGNOSIS — E11319 Type 2 diabetes mellitus with unspecified diabetic retinopathy without macular edema: Secondary | ICD-10-CM

## 2021-11-13 DIAGNOSIS — Z85048 Personal history of other malignant neoplasm of rectum, rectosigmoid junction, and anus: Secondary | ICD-10-CM | POA: Insufficient documentation

## 2021-11-13 DIAGNOSIS — R5383 Other fatigue: Secondary | ICD-10-CM

## 2021-11-13 DIAGNOSIS — R69 Illness, unspecified: Secondary | ICD-10-CM | POA: Diagnosis not present

## 2021-11-13 NOTE — Progress Notes (Signed)
[facility] [address] [place] [address] Dept: [phone] Dept Fax: [phone]  Chronic Care Office Visit  Subjective:    Patient ID: Destiny Romero, female    DOB: [birth year], 71 y.o..   MRN: [id number]  Chief Complaint  Patient presents with   Follow-up    3 month f/u.  C/o feeling cold and fatigue.  Not fasting today.     History of Present Illness:  Patient is in today for reassessment of chronic medical issues.  Ms. Destiny Romero has a history of type 2 diabetes. She is currently managed on glimepiride. She was previously on metformin, but had issues with dizziness. She has some extensive history of eye disease, though apparently not all related to diabetes. She has some permanent scarring of the retina, so notes waviness to vision from right eye.  Ms. Destiny Romero complains of noting feelings of coldness and fatigue/weakness over the past several months. She notes she is having trouble concentrating at times. She agrees that she often does not feel rested in the mornings. Though she gets up for work, on the weekends, she has days where she spends all day in bed.   Ms. Destiny Romero has a history of anemia and is on a daily iron supplement. She notes that she has consistently run low normal with this.   Ms. Destiny Romero has a history of mixed depression and anxiety. She is currently managed on lamotrigine by Dr. Barbie Romero. She notes she is impacted int he witner months more with these sort of feelings.   Ms. Destiny Romero has a history of hyperlipidemia and is managed on rosuvastatin and Lovaza (omega-3 fatty acid).  Past Medical History: Patient Active Problem List   Diagnosis Date Noted   Degenerative lumbar spinal stenosis 03/03/2021   Lumbar radiculopathy 01/22/2021   Osteoarthritis of both hands 11/10/2020   Gastroesophageal reflux disease 11/27/2019   Atypical chest pain 11/07/2018   BMI 29.0-29.9,adult 11/07/2018   Dyspnea 11/07/2018    Dupuytren's contracture of both hands 05/30/2018   Constipation 06/08/2016   Decreased visual acuity 06/08/2016   Type 2 diabetes mellitus with retinopathy ([place]) 08/11/2013   Osteopenia 08/19/2012   Hyperlipidemia 06/27/2008   History of colonic polyps 06/27/2008   Malignant tumor of rectum ([place]) 06/27/2008   Depression with anxiety 01/19/2007   Past Surgical History:  Procedure Laterality Date   CARPAL TUNNEL RELEASE Right 05/2020   CATARACT EXTRACTION, BILATERAL Bilateral    cataracts   CERVICAL CONE BIOPSY     COLONOSCOPY  2003   COLONOSCOPY  04/01/2011   [place] - repeat in 5 years   COLONOSCOPY W/ POLYPECTOMY  2007   Dr Earlean Shawl   EXCISION MORTON'S NEUROMA Right    LAMINECTOMY     1982   OOPHORECTOMY Left 12/2011   Dr Ubaldo Glassing ; ovarian cyst   POLYPECTOMY     RECTAL SURGERY  05/28/2006   pre-canerous lesion removed in 2007; Dr Morton Stall, [place]   TUBAL LIGATION     Family History  Problem Relation Age of Onset   Diabetes Mother    Dementia Mother    Prostate cancer Father    COPD Father    Heart disease Father        CAD - used nitroglycerin tablets   Diabetes Sister        obese   Thyroid cancer Sister    Diabetes Brother        not obese   Lymphoma Brother  NHL   Cancer Brother        non Hodgkin's Lymphoma   Diabetes Brother    Diabetes Maternal Aunt        X5 ; all IDDM   Heart disease Maternal Aunt        "enlarged heart"   Heart disease Maternal Uncle        "enlarged heart"   Diabetes Maternal Grandmother        IDDM   Diabetes Paternal Grandmother        IDDM   Heart disease Paternal Grandmother    Heart disease Paternal Grandfather        CAD - used nitroglycerin tablets   Colon cancer Neg Hx    Esophageal cancer Neg Hx    Stomach cancer Neg Hx    Stroke Neg Hx    Colon polyps Neg Hx    Rectal cancer Neg Hx    Outpatient Medications Prior to Visit  Medication Sig Dispense Refill   Calcium 200 MG TABS Take by mouth. Tale [phone] mg daily      cetirizine (ZYRTEC) 10 MG tablet Take 10 mg by mouth daily.     Cholecalciferol (VITAMIN D) 2000 units CAPS daily.     famotidine (PEPCID) 40 MG tablet TAKE 1 TABLET BY MOUTH EVERYDAY AT BEDTIME 90 tablet 1   FERROCITE 324 MG TABS tablet TAKE 1 TABLET (106 MG OF IRON TOTAL) BY MOUTH DAILY. 90 tablet 1   glimepiride (AMARYL) 1 MG tablet TAKE 1 TABLET BY MOUTH TWO A DAY WITH A MEAL 180 tablet 1   Glucosamine-Chondroitin 1500-1200 MG/30ML LIQD daily.     lamoTRIgine (LAMICTAL) 200 MG tablet Take 200 mg by mouth daily.     melatonin 3 MG TABS tablet 1 tablet at bedtime as needed     Multiple Vitamin (MULTI VITAMIN) TABS daily.     omega-3 acid ethyl esters (LOVAZA) 1 g capsule Take by mouth daily.     OneTouch Delica Lancets 01V MISC Use to test blood sugar 3X daily.  Dx Code: E11.65 300 each 1   ONETOUCH VERIO test strip USE TO TEST BLOOD SUGAR 3X DAILY. DX CODE: E11.65 300 strip 12   rosuvastatin (CRESTOR) 5 MG tablet TAKE 1 TABLET (5 MG TOTAL) BY MOUTH 2 (TWO) TIMES A WEEK. 24 tablet 1   COVID-19 Antigen Test KIT 1 application by In Vitro route daily as needed. (Patient not taking: Reported on 09/30/2021) 4 kit 0   No facility-administered medications prior to visit.   Allergies  Allergen Reactions   Cortisone Palpitations, Other (See Comments) and Nausea And Vomiting    hyperactivity hyperactivity   Short Ragweed Pollen Ext Itching   Grass Pollen(K-O-R-T-Swt Vern) Rash   Other Other (See Comments), Hives, Itching and Nausea Only    Other reaction(s): Headache   Pseudoephedrine Other (See Comments)    tachycardia tachycardia   Atorvastatin Other (See Comments)    Low dose   Codeine    Fluoxetine Hcl Other (See Comments)   Metformin Other (See Comments)    04/15/15 caused dizziness   Metformin And Related     04/15/15 caused dizziness   Shellfish Allergy Other (See Comments)    [other]      Objective:   Today's Vitals   11/13/21 0758  BP: 120/66  Pulse: 70  Temp: 97.6 F  (36.4 C)  TempSrc: Temporal  SpO2: 98%  Weight: 176 lb 3.2 oz (79.9 kg)  Height: 5' 4" (1.626 m)  Body mass index is 30.24 kg/m.   General: Well developed, well nourished. No acute distress. Lungs: Clear to auscultation bilaterally. No wheezing, rales or rhonchi. CV: RRR without murmurs or rubs. Pulses 2+ bilaterally. Extremities: No edema noted. Psych: Alert and oriented. Normal mood and affect.  There are no preventive care reminders to display for this patient.    Depression screen [facility] 2/9 11/13/2021 11/05/2020 03/21/2020  Decreased Interest 1 0 1  Down, Depressed, Hopeless 3 0 1  PHQ - 2 Score 4 0 2  Altered sleeping 2 - 0  Tired, decreased energy 3 - 1  Change in appetite 1 - 1  Feeling bad or failure about yourself  1 - 1  Trouble concentrating 1 - 1  Moving slowly or fidgety/restless 0 - 0  Suicidal thoughts 0 - 0  PHQ-9 Score 12 - 6  Difficult doing work/chores Not difficult at all - Somewhat difficult    Assessment & Plan:   1. Type 2 diabetes mellitus with right eye affected by retinopathy without macular edema, without long-term current use of insulin, unspecified retinopathy severity (HCC) We will check her A1c to make sure her diabetes is in reasonable control. I do not anticipate that this is a major contributor to her fatigue. Continue glimepride.  - Comprehensive metabolic panel; Future - Hemoglobin A1c; Future  2. Hyperlipidemia, unspecified hyperlipidemia type Due for repeat lipids. Continue rosuvastatin and Lovaza.  - Lipid panel; Future  3. Other fatigue I will check screening labs for other causes of fatigue.  - TSH; Future - Iron, TIBC and Ferritin Panel; Future - CBC; Future - Comprehensive metabolic panel; Future  4. Depression with anxiety I suspect Ms. Creek's underlying issues is worsening depression. She is managed by Dr. Barbie Romero (psychiatry) on lamotrigine. She notes she was on Prozac at some point int he past and this was ineffective. I  offered to restart an SSRI to see if this will help improve her depressive symptoms. She would prefer to see Dr. Barbie Romero to discuss.  Return in about 3 months (around 02/13/2022) for Reassessment.   Haydee Salter, MD

## 2021-11-14 ENCOUNTER — Other Ambulatory Visit: Payer: Medicare HMO

## 2021-11-19 ENCOUNTER — Other Ambulatory Visit: Payer: Self-pay

## 2021-11-19 ENCOUNTER — Other Ambulatory Visit (INDEPENDENT_AMBULATORY_CARE_PROVIDER_SITE_OTHER): Payer: Medicare HMO

## 2021-11-19 DIAGNOSIS — E785 Hyperlipidemia, unspecified: Secondary | ICD-10-CM | POA: Diagnosis not present

## 2021-11-19 DIAGNOSIS — E11319 Type 2 diabetes mellitus with unspecified diabetic retinopathy without macular edema: Secondary | ICD-10-CM

## 2021-11-19 DIAGNOSIS — R5383 Other fatigue: Secondary | ICD-10-CM | POA: Diagnosis not present

## 2021-11-19 LAB — COMPREHENSIVE METABOLIC PANEL
ALT: 24 U/L (ref 0–35)
AST: 23 U/L (ref 0–37)
Albumin: 4.3 g/dL (ref 3.5–5.2)
Alkaline Phosphatase: 47 U/L (ref 39–117)
BUN: 22 mg/dL (ref 6–23)
CO2: 31 mEq/L (ref 19–32)
Calcium: 9.6 mg/dL (ref 8.4–10.5)
Chloride: 106 mEq/L (ref 96–112)
Creatinine, Ser: 1.15 mg/dL (ref 0.40–1.20)
GFR: 47.67 mL/min — ABNORMAL LOW (ref >60.00)
Glucose, Bld: 82 mg/dL (ref 70–99)
Potassium: 4.2 mEq/L (ref 3.5–5.1)
Sodium: 142 mEq/L (ref 135–145)
Total Bilirubin: 0.8 mg/dL (ref 0.2–1.2)
Total Protein: 6.4 g/dL (ref 6.0–8.3)

## 2021-11-19 LAB — CBC
HCT: 34.8 % — ABNORMAL LOW (ref 36.0–46.0)
Hemoglobin: 11.9 g/dL — ABNORMAL LOW (ref 12.0–15.0)
MCHC: 34.1 g/dL (ref 30.0–36.0)
MCV: 92 fl (ref 78.0–100.0)
Platelets: 241 10*3/uL (ref 150.0–400.0)
RBC: 3.78 Mil/uL — ABNORMAL LOW (ref 3.87–5.11)
RDW: 12.9 % (ref 11.5–15.5)
WBC: 7.9 10*3/uL (ref 4.0–10.5)

## 2021-11-19 LAB — LIPID PANEL
Cholesterol: 163 mg/dL (ref 0–200)
HDL: 65.5 mg/dL (ref >39.00)
LDL Cholesterol: 78 mg/dL (ref 0–99)
NonHDL: 97.07
Total CHOL/HDL Ratio: 2
Triglycerides: 93 mg/dL (ref 0.0–149.0)
VLDL: 18.6 mg/dL (ref 0.0–40.0)

## 2021-11-19 LAB — TSH: TSH: 2.07 u[IU]/mL (ref 0.35–5.50)

## 2021-11-19 LAB — HEMOGLOBIN A1C: Hgb A1c MFr Bld: 7.1 % — ABNORMAL HIGH (ref 4.6–6.5)

## 2021-11-20 LAB — IRON,TIBC AND FERRITIN PANEL
%SAT: 29 % (calc) (ref 16–45)
Ferritin: 257 ng/mL (ref 16–288)
Iron: 79 ug/dL (ref 45–160)
TIBC: 271 mcg/dL (calc) (ref 250–450)

## 2021-11-21 DIAGNOSIS — M19049 Primary osteoarthritis, unspecified hand: Secondary | ICD-10-CM | POA: Insufficient documentation

## 2021-11-21 DIAGNOSIS — M79645 Pain in left finger(s): Secondary | ICD-10-CM | POA: Diagnosis not present

## 2021-11-21 DIAGNOSIS — M13849 Other specified arthritis, unspecified hand: Secondary | ICD-10-CM | POA: Diagnosis not present

## 2021-11-24 ENCOUNTER — Encounter: Payer: Self-pay | Admitting: Nurse Practitioner

## 2021-11-24 ENCOUNTER — Other Ambulatory Visit: Payer: Self-pay

## 2021-11-24 ENCOUNTER — Ambulatory Visit: Payer: Medicare HMO | Admitting: Nurse Practitioner

## 2021-11-24 VITALS — BP 122/82

## 2021-11-24 DIAGNOSIS — R300 Dysuria: Secondary | ICD-10-CM

## 2021-11-24 DIAGNOSIS — R69 Illness, unspecified: Secondary | ICD-10-CM | POA: Diagnosis not present

## 2021-11-24 DIAGNOSIS — N898 Other specified noninflammatory disorders of vagina: Secondary | ICD-10-CM

## 2021-11-24 DIAGNOSIS — Z113 Encounter for screening for infections with a predominantly sexual mode of transmission: Secondary | ICD-10-CM | POA: Diagnosis not present

## 2021-11-24 DIAGNOSIS — N9089 Other specified noninflammatory disorders of vulva and perineum: Secondary | ICD-10-CM | POA: Diagnosis not present

## 2021-11-24 DIAGNOSIS — N952 Postmenopausal atrophic vaginitis: Secondary | ICD-10-CM

## 2021-11-24 DIAGNOSIS — N760 Acute vaginitis: Secondary | ICD-10-CM | POA: Diagnosis not present

## 2021-11-24 LAB — WET PREP FOR TRICH, YEAST, CLUE

## 2021-11-24 MED ORDER — VALACYCLOVIR HCL 1 G PO TABS: 1000.0000 mg | ORAL_TABLET | Freq: Two times a day (BID) | ORAL | 0 refills | Status: AC

## 2021-11-24 MED ORDER — LIDOCAINE 5 % EX OINT: 1.0000 "application " | TOPICAL_OINTMENT | CUTANEOUS | 0 refills | Status: AC | PRN

## 2021-11-24 NOTE — Progress Notes (Signed)
? ?  Acute Office Visit ? ?Subjective:  ? ? Patient ID: Destiny Romero, female    DOB: [birth year], 71 y.o.   MRN: [id number] ? ? ?HPI ?71 y.o. presents today for burning with urination and vaginal irritation and swelling. She was sexually active last week for the first time in 4 years. Intercourse occurred multiple times. A couple days later she experienced vulvar pain and burning with urination. She started spotting a couple of days go. Spotting is light. She did baking soda bath with some relief. Tried Azo. ? ? ?Review of Systems  ?Constitutional: Negative.   ?Genitourinary:  Positive for dysuria, genital sores, vaginal bleeding and vaginal pain. Negative for difficulty urinating, flank pain, frequency, hematuria and pelvic pain.  ? ?   ?Objective:  ?  ?Physical Exam ?Constitutional:   ?   Appearance: Normal appearance.  ?Genitourinary: ?   Labia:     ?   Right: Lesion present.   ?   Vagina: Erythema, tenderness and bleeding present.  ?   Cervix: Friability present. No erythema.  ? ? ? ? ?BP 122/82   LMP 04/07/2001 (Approximate)  ?Wt Readings from Last 3 Encounters:  ?11/13/21 176 lb 3.2 oz (79.9 kg)  ?10/17/21 165 lb (74.8 kg)  ?09/30/21 165 lb (74.8 kg)  ? ?Wet prep negative ?UA trace leukocytes, nitrite positive (AZO use), 1+ blood, SG 1.010, Orange/clear. Microscopic: wbc 6-10, rbc 0-2, few bacteria ?   ? ?Patient informed chaperone available to be present for breast and pelvic exam. Patient has requested no chaperone to be present. Patient has been advised what will be completed during breast and pelvic exam.  ? ?Assessment & Plan:  ? ?Problem List Items Addressed This Visit   ?None ?Visit Diagnoses   ? ? Dysuria    -  Primary  ? Relevant Orders  ? Urinalysis,Complete w/RFL Culture  ? Vaginal irritation      ? Relevant Orders  ? WET PREP FOR [place], YEAST, CLUE  ? Vulvar lesion      ? Relevant Medications  ? valACYclovir (VALTREX) 1000 MG tablet  ? lidocaine (XYLOCAINE) 5 % ointment  ? Other Relevant Orders  ?  SureSwab HSV, Type 1/2 DNA, PCR  ? Screen for STD (sexually transmitted disease)      ? Relevant Orders  ? C. trachomatis/N. gonorrhoeae RNA  ? Atrophic vaginitis      ? ?  ? ?Plan: HSV culture, urine culture, STD panel pending. Negative wet prep. Due to suspicion of HSV will treat now - Valacyclovir 1000 mg BID x 7 days. Apply lidocaine ointment as needed and also do sitz baths for symptom management. If bleeding continues she will let us know. Bleeding appears to be vaginal only as tissue is friable. We did discuss vaginal estrogen briefly as option once tissue has healed.  ? ? ? ?Tamela Gammon DNP, 2:13 PM 11/24/2021 ? ?

## 2021-11-26 ENCOUNTER — Telehealth: Payer: Self-pay | Admitting: *Deleted

## 2021-11-26 LAB — URINE CULTURE
MICRO NUMBER:: 13152151
Result:: NO GROWTH
SPECIMEN QUALITY:: ADEQUATE

## 2021-11-26 LAB — URINALYSIS, COMPLETE W/RFL CULTURE
Bilirubin Urine: NEGATIVE
Glucose, UA: NEGATIVE
Hyaline Cast: NONE SEEN /LPF
Ketones, ur: NEGATIVE
Nitrites, Initial: POSITIVE — AB
Protein, ur: NEGATIVE
Specific Gravity, Urine: 1.01 (ref 1.001–1.035)
pH: 7 (ref 5.0–8.0)

## 2021-11-26 LAB — CULTURE INDICATED

## 2021-11-26 NOTE — Telephone Encounter (Signed)
PA done via cover my meds for lidocaine 5 % ointment via cover my meds with [facility] [employer]. Pending response.  ? ?Medication approved until 11/2022, pharmacy notified.  ?

## 2021-11-27 LAB — C. TRACHOMATIS/N. GONORRHOEAE RNA
C. trachomatis RNA, TMA: NOT DETECTED
N. gonorrhoeae RNA, TMA: NOT DETECTED

## 2021-11-28 ENCOUNTER — Telehealth: Payer: Self-pay

## 2021-11-28 DIAGNOSIS — N950 Postmenopausal bleeding: Secondary | ICD-10-CM

## 2021-11-28 LAB — SURESWAB HSV, TYPE 1/2 DNA, PCR
HSV 1 DNA: NOT DETECTED
HSV 2 DNA: DETECTED — AB

## 2021-11-28 NOTE — Telephone Encounter (Signed)
Pt notified and voiced understanding as far as HSV 2 results. Pt reports she has felt some relief but not 100% yet in that area. Pt notified to continue valtrex until out and lidocaine ointment prn and to let us know if continues after. ? ?Pt also reports that bleeding has persisted. States she is having to change pad q3-4 hours.  ? ?Please advise.  ?

## 2021-12-01 NOTE — Telephone Encounter (Signed)
Pt notified and voiced understanding. Msg sent to scheduling to set up appt.  ?

## 2021-12-01 NOTE — Telephone Encounter (Signed)
I recommend she schedule an ultrasound for further evaluation of the bleeding.  ?

## 2021-12-02 NOTE — Telephone Encounter (Signed)
FYI. Scheduled for 12/18/21 with JJ.  ?

## 2021-12-03 ENCOUNTER — Ambulatory Visit: Payer: Medicare HMO

## 2021-12-05 ENCOUNTER — Other Ambulatory Visit: Payer: Self-pay | Admitting: Family Medicine

## 2021-12-15 ENCOUNTER — Other Ambulatory Visit: Payer: Self-pay | Admitting: Family Medicine

## 2021-12-15 NOTE — Telephone Encounter (Signed)
Refill request for  ?Ferrocite 324 mg ?LR 06/25/21, #90, 1 rf  (Dr Charlett Blake) ?LOV 11/13/21 ?FOV 02/17/22 ? ?Please review and advise.  ?Thanks  Dm/cma ? ?

## 2021-12-18 ENCOUNTER — Ambulatory Visit (INDEPENDENT_AMBULATORY_CARE_PROVIDER_SITE_OTHER): Payer: Medicare HMO

## 2021-12-18 ENCOUNTER — Ambulatory Visit (INDEPENDENT_AMBULATORY_CARE_PROVIDER_SITE_OTHER): Payer: Medicare HMO | Admitting: Obstetrics and Gynecology

## 2021-12-18 VITALS — BP 110/72 | HR 73 | Ht 63.5 in | Wt 173.0 lb

## 2021-12-18 DIAGNOSIS — A6000 Herpesviral infection of urogenital system, unspecified: Secondary | ICD-10-CM

## 2021-12-18 DIAGNOSIS — N898 Other specified noninflammatory disorders of vagina: Secondary | ICD-10-CM | POA: Diagnosis not present

## 2021-12-18 DIAGNOSIS — N950 Postmenopausal bleeding: Secondary | ICD-10-CM

## 2021-12-18 DIAGNOSIS — N930 Postcoital and contact bleeding: Secondary | ICD-10-CM

## 2021-12-18 DIAGNOSIS — Z113 Encounter for screening for infections with a predominantly sexual mode of transmission: Secondary | ICD-10-CM | POA: Diagnosis not present

## 2021-12-18 DIAGNOSIS — R69 Illness, unspecified: Secondary | ICD-10-CM | POA: Diagnosis not present

## 2021-12-18 LAB — WET PREP FOR TRICH, YEAST, CLUE

## 2021-12-18 MED ORDER — VALACYCLOVIR HCL 500 MG PO TABS: ORAL_TABLET | ORAL | 1 refills | Status: DC

## 2021-12-18 NOTE — Progress Notes (Signed)
GYNECOLOGY  VISIT ?  ?HPI: ?71 y.o.   Single White or Caucasian Not Hispanic or Latino  female   ?[id number] with Patient's last menstrual period was 04/07/2001 (approximate).   ?here for evaluation of PMP bleeding ?She was seen on 11/24/21 for vulvar pain/swelling and dysuria. She had been sexually active with a new partner the prior week. She reported spotting a few days prior to her visit.  ?She was noted to have a friable cervix and friable vagina. She had a lesion/fissure on her vulva that returned as + for hsv 2. ?Negative testing for GC/CT. Urine culture was negative. ?Her partner also gave her crabs, she self treated.  ? ?The spotting only started with intercourse. Now having a green d/c. She stated in the past when she was sexually active she would always bleed afterwards. ? ? ?GYNECOLOGIC HISTORY: ?Patient's last menstrual period was 04/07/2001 (approximate). ?Contraception:none  ?Menopausal hormone therapy: none  ?       ?OB History   ? ? Gravida  ?0  ? Para  ?0  ? Term  ?0  ? Preterm  ?0  ? AB  ?0  ? Living  ?0  ?  ? ? SAB  ?0  ? IAB  ?0  ? Ectopic  ?0  ? Multiple  ?0  ? Live Births  ?0  ?   ?  ?  ?    ? ?Patient Active Problem List  ? Diagnosis Date Noted  ? History of premalignant rectal tumor 11/13/2021  ? Degenerative lumbar spinal stenosis 03/03/2021  ? Lumbar radiculopathy 01/22/2021  ? Osteoarthritis of both hands 11/10/2020  ? Carpal tunnel syndrome of left wrist 04/11/2020  ? Gastroesophageal reflux disease 11/27/2019  ? Atypical chest pain 11/07/2018  ? BMI 29.0-29.9,adult 11/07/2018  ? Dyspnea 11/07/2018  ? Dupuytren's contracture of both hands 05/30/2018  ? Constipation 06/08/2016  ? Decreased visual acuity 06/08/2016  ? Type 2 diabetes mellitus with retinopathy ([place]) 08/11/2013  ? Osteopenia 08/19/2012  ? Hyperlipidemia 06/27/2008  ? History of colonic polyps 06/27/2008  ? Depression with anxiety 01/19/2007  ? ? ?Past Medical History:  ?Diagnosis Date  ? Allergy   ? Anal lesion 2007  ? Anemia    ? Anesthesia complication   ? Per pt/ sensitive to sedation!  ? Arthritis   ? fingers,toes  ? Cancer [facility])   ? "PRECANCEROUS LESIONS IN RECTUM"  ? Cataract   ? bilateral,removed  ? Central retinal artery occlusion 06/08/2016  ? limited vision right eye.  ? Cervical dysplasia 04/1991  ? Dr Rica Koyanagi - CIN I, cone and freeze in 1990s with normal paps since  ? Constipation 06/08/2016  ? moves bowels every 3 rd day   ? Decreased visual acuity 06/08/2016  ? Retinal bleed right eye 2017  ? Depression   ? Diabetes mellitus without complication ([place])   ? Fatigue 06/27/2017  ? H/O measles   ? H/O mumps   ? History of chicken pox   ? Hyperlipidemia   ? Mitral valve prolapse   ? NO MR; no SBE prophylaxis needed  ? Osteoporosis   ? PONV (postoperative nausea and vomiting)   ? Pre-diabetes   ? highest A1c 6.4 %  ? Preventative health care 12/21/2016  ? Squamous cell carcinoma in situ of skin   ? Dr Syble Creek  ? Vision impairment   ? limited vision in right eye.  ? ? ?Past Surgical History:  ?Procedure Laterality Date  ? CARPAL TUNNEL RELEASE Right 05/2020  ?  CATARACT EXTRACTION, BILATERAL Bilateral   ? cataracts  ? CERVICAL CONE BIOPSY    ? COLONOSCOPY  2003  ? COLONOSCOPY  04/01/2011  ? [place] - repeat in 5 years  ? COLONOSCOPY W/ POLYPECTOMY  2007  ? Dr Earlean Shawl  ? EXCISION MORTON'S NEUROMA Right   ? LAMINECTOMY    ? 1982  ? OOPHORECTOMY Left 12/2011  ? Dr Ubaldo Glassing ; ovarian cyst  ? POLYPECTOMY    ? RECTAL SURGERY  05/28/2006  ? pre-canerous lesion removed in 2007; Dr Morton Stall, [place]  ? TUBAL LIGATION    ? ? ?Current Outpatient Medications  ?Medication Sig Dispense Refill  ? Calcium 200 MG TABS Take by mouth. Tale [phone] daily    ? cetirizine (ZYRTEC) 10 MG tablet Take 10 mg by mouth daily.    ? Cholecalciferol (VITAMIN D) 2000 units CAPS daily.    ? famotidine (PEPCID) 40 MG tablet TAKE 1 TABLET BY MOUTH EVERYDAY AT BEDTIME 90 tablet 1  ? FERROCITE 324 MG TABS tablet TAKE 1 TABLET (106 MG OF IRON TOTAL) BY MOUTH DAILY. 90 tablet 1   ? glimepiride (AMARYL) 1 MG tablet TAKE 1 TABLET BY MOUTH TWO A DAY WITH A MEAL 180 tablet 1  ? Glucosamine-Chondroitin 1500-1200 MG/30ML LIQD daily.    ? lamoTRIgine (LAMICTAL) 200 MG tablet Take 200 mg by mouth daily.    ? lidocaine (XYLOCAINE) 5 % ointment Apply 1 application. topically as needed. 30 g 0  ? melatonin 3 MG TABS tablet 1 tablet at bedtime as needed    ? Multiple Vitamin (MULTI VITAMIN) TABS daily.    ? omega-3 acid ethyl esters (LOVAZA) 1 g capsule Take by mouth daily.    ? OneTouch Delica Lancets 44H MISC Use to test blood sugar 3X daily.  Dx Code: E11.65 300 each 1  ? ONETOUCH VERIO test strip USE TO TEST BLOOD SUGAR 3X DAILY. DX CODE: E11.65 300 strip 12  ? rosuvastatin (CRESTOR) 5 MG tablet TAKE 1 TABLET (5 MG TOTAL) BY MOUTH 2 (TWO) TIMES A WEEK. 24 tablet 1  ? ?No current facility-administered medications for this visit.  ?  ? ?ALLERGIES: Cortisone, Short ragweed pollen ext, Grass pollen([id number]-o-r-t-swt vern), Other, Pseudoephedrine, Atorvastatin, Codeine, Fluoxetine hcl, Metformin, Metformin and related, and Shellfish allergy ? ?Family History  ?Problem Relation Age of Onset  ? Diabetes Mother   ? Dementia Mother   ? Prostate cancer Father   ? COPD Father   ? Heart disease Father   ?     CAD - used nitroglycerin tablets  ? Diabetes Sister   ?     obese  ? Thyroid cancer Sister   ? Diabetes Brother   ?     not obese  ? Lymphoma Brother   ?     NHL  ? Cancer Brother   ?     non Hodgkin's Lymphoma  ? Diabetes Brother   ? Diabetes Maternal Aunt   ?     X5 ; all IDDM  ? Heart disease Maternal Aunt   ?     "enlarged heart"  ? Heart disease Maternal Uncle   ?     "enlarged heart"  ? Diabetes Maternal Grandmother   ?     IDDM  ? Diabetes Paternal Grandmother   ?     IDDM  ? Heart disease Paternal Grandmother   ? Heart disease Paternal Grandfather   ?     CAD - used nitroglycerin tablets  ? Colon cancer  Neg Hx   ? Esophageal cancer Neg Hx   ? Stomach cancer Neg Hx   ? Stroke Neg Hx   ? Colon polyps Neg  Hx   ? Rectal cancer Neg Hx   ? ? ?Social History  ? ?Socioeconomic History  ? Marital status: Single  ?  Spouse name: Not on file  ? Number of children: 0  ? Years of education: Not on file  ? Highest education level: Not on file  ?Occupational History  ? 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  ?Tobacco Use  ? Smoking status: Never  ? Smokeless tobacco: Never  ?Vaping Use  ? Vaping Use: Never used  ?Substance and Sexual Activity  ? Alcohol use: Yes  ?  Comment: glass wine once a month  ? Drug use: No  ? Sexual activity: Never  ?  Partners: Male  ?  Birth control/protection: Abstinence  ?Other Topics Concern  ? Not on file  ?Social History Narrative  ? Lives alone  ? No dietary restrictions  ? ?Social Determinants of Health  ? ?Financial Resource Strain: Not on file  ?Food Insecurity: Not on file  ?Transportation Needs: Not on file  ?Physical Activity: Not on file  ?Stress: Not on file  ?Social Connections: Not on file  ?Intimate Partner Violence: Not on file  ? ? ?Review of Systems  ?All other systems reviewed and are negative. ? ?PHYSICAL EXAMINATION:   ? ?LMP 04/07/2001 (Approximate)     ?General appearance: alert, cooperative and appears stated age ? ?Pelvic: External genitalia:  no lesions ?             Urethra:  normal appearing urethra with no masses, tenderness or lesions ?             Bartholins and Skenes: normal    ?             Vagina: atrophic appearing vagina, friable with opening the speculum. Slight amount of greenish vaginal d/c             ? Cervix:  not well seen today ?             Bimanual Exam:  Uterus:   no masses or tenderness ?             Adnexa: no mass, fullness, tenderness ?              ? ?Chaperone was present for exam. ? ?Pelvic ultrasound ? ?Indications: postcoital bleeding ? ?Findings: ? ?Uterus 4.71 x 2.26 cm, anteverted ?1.2 x 0.8 cm intramural, posterior myoma. ? ?Endometrium 1.33 mm, thin and symmetrical ? ?Left ovary surgically absent  ? ?Right ovary 2.04 x 1.39 x 1.06  cm ?Avascular tubular structure noted in the right adnexa, possible hydrosalpinx  ? ?No free fluid ? ?Impression:  ?Small, anteverted uterus with thin, uniform endometrium ?Absent left ovary ?Normal right ovary

## 2021-12-19 LAB — HEPATITIS C ANTIBODY
Hepatitis C Ab: NONREACTIVE
SIGNAL TO CUT-OFF: 0.17 (ref <1.00)

## 2021-12-19 LAB — TRICHOMONAS VAGINALIS, PROBE AMP: Trichomonas vaginalis RNA: NOT DETECTED

## 2021-12-19 LAB — HIV ANTIBODY (ROUTINE TESTING W REFLEX): HIV 1&2 Ab, 4th Generation: NONREACTIVE

## 2021-12-19 LAB — RPR: RPR Ser Ql: NONREACTIVE

## 2021-12-22 ENCOUNTER — Encounter: Payer: Self-pay | Admitting: Obstetrics and Gynecology

## 2021-12-22 ENCOUNTER — Ambulatory Visit: Payer: Medicare HMO | Admitting: Obstetrics and Gynecology

## 2021-12-22 VITALS — BP 134/72 | HR 82 | Wt 174.0 lb

## 2021-12-22 DIAGNOSIS — B3731 Acute candidiasis of vulva and vagina: Secondary | ICD-10-CM | POA: Diagnosis not present

## 2021-12-22 DIAGNOSIS — L292 Pruritus vulvae: Secondary | ICD-10-CM

## 2021-12-22 LAB — WET PREP FOR TRICH, YEAST, CLUE

## 2021-12-22 MED ORDER — HYDROXYZINE HCL 10 MG PO TABS: 10.0000 mg | ORAL_TABLET | Freq: Three times a day (TID) | ORAL | 0 refills | Status: DC | PRN

## 2021-12-22 MED ORDER — CLOBETASOL PROPIONATE 0.05 % EX OINT: TOPICAL_OINTMENT | CUTANEOUS | 0 refills | Status: DC

## 2021-12-22 MED ORDER — FLUCONAZOLE 150 MG PO TABS: 150.0000 mg | ORAL_TABLET | Freq: Once | ORAL | 0 refills | Status: AC

## 2021-12-22 NOTE — Progress Notes (Signed)
GYNECOLOGY  VISIT ?  ?HPI: ?71 y.o.   Single White or Caucasian Not Hispanic or Latino  female   ?G0P0000 with Patient's last menstrual period was 04/07/2001 (approximate).   ?here for vulvar itching. She states that she feels like she has "creeping crawling feeling like worms" in her labia and clitoris area.  ?She self treated for what she thought was crabs a few weeks ago. She hasn't seen anything.  ?She got really itchy Friday evening. She is exhausted, not sleeping well.  ? ?GYNECOLOGIC HISTORY: ?Patient's last menstrual period was 04/07/2001 (approximate). ?Contraception:pmp  ?Menopausal hormone therapy: none ?       ?OB History   ? ? Gravida  ?0  ? Para  ?0  ? Term  ?0  ? Preterm  ?0  ? AB  ?0  ? Living  ?0  ?  ? ? SAB  ?0  ? IAB  ?0  ? Ectopic  ?0  ? Multiple  ?0  ? Live Births  ?0  ?   ?  ?  ?    ? ?Patient Active Problem List  ? Diagnosis Date Noted  ? History of premalignant rectal tumor 11/13/2021  ? Degenerative lumbar spinal stenosis 03/03/2021  ? Lumbar radiculopathy 01/22/2021  ? Osteoarthritis of both hands 11/10/2020  ? Carpal tunnel syndrome of left wrist 04/11/2020  ? Gastroesophageal reflux disease 11/27/2019  ? Atypical chest pain 11/07/2018  ? BMI 29.0-29.9,adult 11/07/2018  ? Dyspnea 11/07/2018  ? Dupuytren's contracture of both hands 05/30/2018  ? Constipation 06/08/2016  ? Decreased visual acuity 06/08/2016  ? Type 2 diabetes mellitus with retinopathy ([place]) 08/11/2013  ? Osteopenia 08/19/2012  ? Hyperlipidemia 06/27/2008  ? History of colonic polyps 06/27/2008  ? Depression with anxiety 01/19/2007  ? ? ?Past Medical History:  ?Diagnosis Date  ? Allergy   ? Anal lesion 2007  ? Anemia   ? Anesthesia complication   ? Per pt/ sensitive to sedation!  ? Arthritis   ? fingers,toes  ? Cancer [facility])   ? "PRECANCEROUS LESIONS IN RECTUM"  ? Cataract   ? bilateral,removed  ? Central retinal artery occlusion 06/08/2016  ? limited vision right eye.  ? Cervical dysplasia 04/1991  ? Dr Rica Koyanagi - [place] I, cone  and freeze in 1990s with normal paps since  ? Constipation 06/08/2016  ? moves bowels every 3 rd day   ? Decreased visual acuity 06/08/2016  ? Retinal bleed right eye 2017  ? Depression   ? Diabetes mellitus without complication ([place])   ? Fatigue 06/27/2017  ? H/O measles   ? H/O mumps   ? History of chicken pox   ? Hyperlipidemia   ? Mitral valve prolapse   ? NO MR; no SBE prophylaxis needed  ? Osteoporosis   ? PONV (postoperative nausea and vomiting)   ? Pre-diabetes   ? highest A1c 6.4 %  ? Preventative health care 12/21/2016  ? Squamous cell carcinoma in situ of skin   ? Dr [place]  ? Vision impairment   ? limited vision in right eye.  ? ? ?Past Surgical History:  ?Procedure Laterality Date  ? CARPAL TUNNEL RELEASE Right 05/2020  ? CATARACT EXTRACTION, BILATERAL Bilateral   ? cataracts  ? CERVICAL CONE BIOPSY    ? COLONOSCOPY  2003  ? COLONOSCOPY  04/01/2011  ? [facility] - repeat in 5 years  ? COLONOSCOPY W/ POLYPECTOMY  2007  ? Dr Earlean Shawl  ? EXCISION MORTON'S NEUROMA Right   ? LAMINECTOMY    ?  1982  ? OOPHORECTOMY Left 12/2011  ? Dr Ubaldo Glassing ; ovarian cyst  ? POLYPECTOMY    ? RECTAL SURGERY  05/28/2006  ? pre-canerous lesion removed in 2007; Dr Morton Stall, [place]  ? TUBAL LIGATION    ? ? ?Current Outpatient Medications  ?Medication Sig Dispense Refill  ? Calcium 200 MG TABS Take by mouth. Tale [phone] mg daily    ? cetirizine (ZYRTEC) 10 MG tablet Take 10 mg by mouth daily.    ? Cholecalciferol (VITAMIN D) 2000 units CAPS daily.    ? famotidine (PEPCID) 40 MG tablet TAKE 1 TABLET BY MOUTH EVERYDAY AT BEDTIME 90 tablet 1  ? FERROCITE 324 MG TABS tablet TAKE 1 TABLET (106 MG OF IRON TOTAL) BY MOUTH DAILY. 90 tablet 1  ? glimepiride (AMARYL) 1 MG tablet TAKE 1 TABLET BY MOUTH TWO A DAY WITH A MEAL 180 tablet 1  ? Glucosamine-Chondroitin 1500-1200 MG/30ML LIQD daily.    ? lamoTRIgine (LAMICTAL) 200 MG tablet Take 200 mg by mouth daily.    ? lidocaine (XYLOCAINE) 5 % ointment Apply 1 application. topically as needed. (Patient  not taking: Reported on 12/18/2021) 30 g 0  ? melatonin 3 MG TABS tablet 1 tablet at bedtime as needed    ? Multiple Vitamin (MULTI VITAMIN) TABS daily.    ? omega-3 acid ethyl esters (LOVAZA) 1 g capsule Take by mouth daily.    ? OneTouch Delica Lancets 31V MISC Use to test blood sugar 3X daily.  Dx Code: E11.65 300 each 1  ? ONETOUCH VERIO test strip USE TO TEST BLOOD SUGAR 3X DAILY. DX CODE: E11.65 300 strip 12  ? rosuvastatin (CRESTOR) 5 MG tablet TAKE 1 TABLET (5 MG TOTAL) BY MOUTH 2 (TWO) TIMES A WEEK. 24 tablet 1  ? valACYclovir (VALTREX) 500 MG tablet Take one tablet po BID x 3 days prn outbreak 30 tablet 1  ? ?No current facility-administered medications for this visit.  ?  ? ?ALLERGIES: Cortisone, Short ragweed pollen ext, Grass pollen(k-o-r-t-swt vern), Other, Pseudoephedrine, Atorvastatin, Codeine, Fluoxetine hcl, Metformin, Metformin and related, and Shellfish allergy ? ?Family History  ?Problem Relation Age of Onset  ? Diabetes Mother   ? Dementia Mother   ? Prostate cancer Father   ? COPD Father   ? Heart disease Father   ?     CAD - used nitroglycerin tablets  ? Diabetes Sister   ?     obese  ? Thyroid cancer Sister   ? Diabetes Brother   ?     not obese  ? Lymphoma Brother   ?     NHL  ? Cancer Brother   ?     non Hodgkin's Lymphoma  ? Diabetes Brother   ? Diabetes Maternal Aunt   ?     X5 ; all IDDM  ? Heart disease Maternal Aunt   ?     "enlarged heart"  ? Heart disease Maternal Uncle   ?     "enlarged heart"  ? Diabetes Maternal Grandmother   ?     IDDM  ? Diabetes Paternal Grandmother   ?     IDDM  ? Heart disease Paternal Grandmother   ? Heart disease Paternal Grandfather   ?     CAD - used nitroglycerin tablets  ? Colon cancer Neg Hx   ? Esophageal cancer Neg Hx   ? Stomach cancer Neg Hx   ? Stroke Neg Hx   ? Colon polyps Neg Hx   ?  Rectal cancer Neg Hx   ? ? ?Social History  ? ?Socioeconomic History  ? Marital status: Single  ?  Spouse name: Not on file  ? Number of children: 0  ? Years of  education: Not on file  ? Highest education level: Not on file  ?Occupational History  ? 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  ?Tobacco Use  ? Smoking status: Never  ? Smokeless tobacco: Never  ?Vaping Use  ? Vaping Use: Never used  ?Substance and Sexual Activity  ? Alcohol use: Yes  ?  Comment: glass wine once a month  ? Drug use: No  ? Sexual activity: Never  ?  Partners: Male  ?  Birth control/protection: Abstinence  ?Other Topics Concern  ? Not on file  ?Social History Narrative  ? Lives alone  ? No dietary restrictions  ? ?Social Determinants of Health  ? ?Financial Resource Strain: Not on file  ?Food Insecurity: Not on file  ?Transportation Needs: Not on file  ?Physical Activity: Not on file  ?Stress: Not on file  ?Social Connections: Not on file  ?Intimate Partner Violence: Not on file  ? ? ?Review of Systems  ?All other systems reviewed and are negative. ? ?PHYSICAL EXAMINATION:   ? ?BP 134/72   Pulse 82   Wt 174 lb (78.9 kg)   LMP 04/07/2001 (Approximate)   SpO2 100%   BMI 30.34 kg/m?     ?General appearance: alert, cooperative and appears stated age ? ?Pelvic: External genitalia:  no lesions, mild erythema, healing fissure between the right labia majora and minora.  ?             Urethra:  normal appearing urethra with no masses, tenderness or lesions ?             Bartholins and Skenes: normal    ?             Vagina: normal appearing vagina with normal color and discharge, no lesions ?             Cervix: no lesions ?              ?Chaperone was present for exam. ? ?1. Vulvar pruritus ?Severe.  ?- hydrOXYzine (ATARAX) 10 MG tablet; Take 1 tablet (10 mg total) by mouth 3 (three) times daily as needed.  Dispense: 30 tablet; Refill: 0 ?- clobetasol ointment (TEMOVATE) 0.05 %; Apply a pea sized amount topically BID for up to 2 weeks as needed.  Dispense: 30 g; Refill: 0 ?- WET PREP FOR [place]ICH, YEAST, CLUE: + yeast ? ?2. Yeast vaginitis ?- fluconazole (DIFLUCAN) 150 MG tablet; Take 1 tablet (150 mg  total) by mouth once for 1 dose. Take one tablet.  Repeat in 72 hours if symptoms are not completely resolved.  Dispense: 2 tablet; Refill: 0 ? ?

## 2021-12-22 NOTE — Patient Instructions (Signed)

## 2021-12-23 ENCOUNTER — Encounter: Payer: Self-pay | Admitting: Obstetrics and Gynecology

## 2021-12-23 ENCOUNTER — Telehealth: Payer: Self-pay | Admitting: Obstetrics and Gynecology

## 2021-12-23 ENCOUNTER — Ambulatory Visit: Payer: Medicare HMO | Admitting: Radiology

## 2021-12-23 MED ORDER — CLOBETASOL PROPIONATE 0.05 % EX OINT: TOPICAL_OINTMENT | CUTANEOUS | 0 refills | Status: DC

## 2021-12-23 NOTE — Telephone Encounter (Signed)
Please let the patient know that I reviewed her ultrasound images with our lead and very experienced u/s tech. She agreed with me that the findings on her ultrasound last week with a possible thickening in her endometrium was a false positive. Only needs further evaluation if she bleeds again.  ?

## 2021-12-24 NOTE — Telephone Encounter (Signed)
Patient informed. 

## 2021-12-30 ENCOUNTER — Telehealth: Payer: Self-pay | Admitting: Family Medicine

## 2021-12-30 NOTE — Telephone Encounter (Signed)
Left message for patient to call back and schedule Medicare Annual Wellness Visit (AWV).  ? ?Please offer to do virtually or by telephone.  Left office number and my jabber [phone]. ? ?Last AWV:03/21/2020 ? ?Please schedule at anytime with Nurse Health Advisor. ?  ?

## 2021-12-31 ENCOUNTER — Ambulatory Visit: Payer: Medicare HMO | Admitting: Family Medicine

## 2021-12-31 NOTE — Progress Notes (Signed)
? ?Destiny Romero 1951-06-15 [id number] ? ? ?History:  71 y.o. G0 presents for breast and pelvic exam. Postmenopausal - no HRT.  History of high grade anal dysplasia with hpv in 2007. Anal pap and hpv were negative in 11/18. Cervical cone biopsy >20 years ago. Recent HSV outbreak. Negative STD screening 3/20 and 4/17. She self treated for what she thought was crabs around that time. +yeast 12/22/2021. DM, HLD managed by PCP.  ? ?Gynecologic History ?Patient's last menstrual period was 04/07/2001 (approximate). ?  ?Contraception: post menopausal status ?Sexually active: Yes ? ?Health Maintenance ?Last Pap: 07/01/2016. Results were: Normal ?Last mammogram: 08/08/2021. Results were: Normal ?Last colonoscopy: 10/17/2021. Results were: Normal, 5-year recall ?Last Dexa: 10/07/2021. Results were: T-score -2.0, FRAX 12% / 2.4% ? ?Past medical history, past surgical history, family history and social history were all reviewed and documented in the EPIC chart. ? ?ROS:  A ROS was performed and pertinent positives and negatives are included. ? ?Exam: ? ?Vitals:  ? 01/01/22 0824  ?BP: 124/82  ?Weight: 168 lb (76.2 kg)  ?Height: '5\' 3"'$  (1.6 m)  ? ?Body mass index is 29.76 kg/m?. ? ?General appearance:  Normal ?Thyroid:  Symmetrical, normal in size, without palpable masses or nodularity. ?Respiratory ? Auscultation:  Clear without wheezing or rhonchi ?Cardiovascular ? Auscultation:  Regular rate, without rubs, murmurs or gallops ? Edema/varicosities:  Not grossly evident ?Abdominal ? Soft,nontender, without masses, guarding or rebound. ? Liver/spleen:  No organomegaly noted ? Hernia:  None appreciated ? Skin ? Inspection:  Grossly normal ?Breasts: Examined lying and sitting.  ? Right: Without masses, retractions, nipple discharge or axillary adenopathy. ? ? Left: Without masses, retractions, nipple discharge or axillary adenopathy. ?Genitourinary  ? Inguinal/mons:  Normal without inguinal adenopathy ? External genitalia:  Normal  appearing vulva with no masses, tenderness, or lesions ? BUS/Urethra/Skene's glands:  Normal ? Vagina:  Normal appearing with normal color and discharge, no lesions ? Cervix:  Normal appearing without discharge or lesions ? Uterus:  Normal in size, shape and contour.  Midline and mobile, nontender ? Adnexa/parametria:   ?  Rt: Normal in size, without masses or tenderness. ?  Lt: Normal in size, without masses or tenderness. ? Anus and perineum: Normal ? Digital rectal exam: Normal sphincter tone without palpated masses or tenderness ? ?Patient informed chaperone available to be present for breast and pelvic exam. Patient has requested no chaperone to be present. Patient has been advised what will be completed during breast and pelvic exam.  ? ?Assessment/Plan:  71 y.o. G0 for breast and pelvic exam.  ? ?Well female exam with routine gynecological exam - Plan: Cytology - PAP( [place]). Education provided on SBEs, importance of preventative screenings, current guidelines, high calcium diet, regular exercise, and multivitamin daily.  Labs with PCP.  ? ?Postmenopausal - no HRT, no bleeding ? ?Osteopenia of necks of both femurs - T-score -2.0 without elevated FRAX January 2023. Continue Vitamin D + Calcium and regular exercise. Will repeat at 2-year interval.  ? ?History of premalignant rectal tumor - Plan: Cytology - PAP( [place]). 2007. Most recent anal pap in 2018 normal neg HR HPV.  ? ?Screening for cervical cancer - Normal Pap history.  Pap with HR HPV. ? ?Screening for breast cancer - Normal mammogram history.  Continue annual screenings.  Normal breast exam today. ? ?Screening for colon cancer - 2023 colonoscopy. Will repeat at 5-year interval per GI's recommendation.  ? ?Screening for osteoporosis - Average risk. Will plan DXA  at age 89.  ? ? ? ?Return in 1 year for breast and pelvic exam.  ? ?Tamela Gammon DNP, 9:01 AM 01/01/2022  ?

## 2022-01-01 ENCOUNTER — Encounter: Payer: Self-pay | Admitting: Nurse Practitioner

## 2022-01-01 ENCOUNTER — Ambulatory Visit (INDEPENDENT_AMBULATORY_CARE_PROVIDER_SITE_OTHER): Payer: Medicare HMO | Admitting: Nurse Practitioner

## 2022-01-01 ENCOUNTER — Other Ambulatory Visit (HOSPITAL_COMMUNITY)
Admission: RE | Admit: 2022-01-01 | Discharge: 2022-01-01 | Disposition: A | Discharge status: Home / Self Care | Payer: Medicare HMO | Source: Ambulatory Visit | Attending: Nurse Practitioner | Admitting: Nurse Practitioner

## 2022-01-01 VITALS — BP 124/82 | Ht 63.0 in | Wt 168.0 lb

## 2022-01-01 DIAGNOSIS — Z124 Encounter for screening for malignant neoplasm of cervix: Secondary | ICD-10-CM | POA: Diagnosis not present

## 2022-01-01 DIAGNOSIS — Z85048 Personal history of other malignant neoplasm of rectum, rectosigmoid junction, and anus: Secondary | ICD-10-CM | POA: Diagnosis not present

## 2022-01-01 DIAGNOSIS — R69 Illness, unspecified: Secondary | ICD-10-CM | POA: Diagnosis not present

## 2022-01-01 DIAGNOSIS — M85852 Other specified disorders of bone density and structure, left thigh: Secondary | ICD-10-CM | POA: Diagnosis not present

## 2022-01-01 DIAGNOSIS — M85851 Other specified disorders of bone density and structure, right thigh: Secondary | ICD-10-CM

## 2022-01-01 DIAGNOSIS — Z01419 Encounter for gynecological examination (general) (routine) without abnormal findings: Secondary | ICD-10-CM | POA: Diagnosis present

## 2022-01-01 DIAGNOSIS — Z1151 Encounter for screening for human papillomavirus (HPV): Secondary | ICD-10-CM | POA: Diagnosis not present

## 2022-01-01 DIAGNOSIS — Z9189 Other specified personal risk factors, not elsewhere classified: Secondary | ICD-10-CM

## 2022-01-01 DIAGNOSIS — Z780 Asymptomatic menopausal state: Secondary | ICD-10-CM

## 2022-01-02 ENCOUNTER — Encounter: Payer: Self-pay | Admitting: Obstetrics and Gynecology

## 2022-01-05 ENCOUNTER — Encounter: Payer: Self-pay | Admitting: Nurse Practitioner

## 2022-01-05 DIAGNOSIS — F4310 Post-traumatic stress disorder, unspecified: Secondary | ICD-10-CM | POA: Diagnosis not present

## 2022-01-05 DIAGNOSIS — R69 Illness, unspecified: Secondary | ICD-10-CM | POA: Diagnosis not present

## 2022-01-05 DIAGNOSIS — F339 Major depressive disorder, recurrent, unspecified: Secondary | ICD-10-CM | POA: Diagnosis not present

## 2022-01-06 LAB — CYTOLOGY - PAP
Comment: NEGATIVE
High risk HPV: NEGATIVE

## 2022-01-07 ENCOUNTER — Encounter: Payer: Self-pay | Admitting: Family Medicine

## 2022-01-07 ENCOUNTER — Ambulatory Visit (INDEPENDENT_AMBULATORY_CARE_PROVIDER_SITE_OTHER): Payer: Medicare HMO | Admitting: Family Medicine

## 2022-01-07 VITALS — BP 124/68 | HR 64 | Temp 97.1°F | Ht 63.0 in | Wt 171.0 lb

## 2022-01-07 DIAGNOSIS — R5383 Other fatigue: Secondary | ICD-10-CM | POA: Diagnosis not present

## 2022-01-07 DIAGNOSIS — F418 Other specified anxiety disorders: Secondary | ICD-10-CM

## 2022-01-07 DIAGNOSIS — R69 Illness, unspecified: Secondary | ICD-10-CM | POA: Diagnosis not present

## 2022-01-07 DIAGNOSIS — F4310 Post-traumatic stress disorder, unspecified: Secondary | ICD-10-CM | POA: Diagnosis not present

## 2022-01-07 DIAGNOSIS — E11319 Type 2 diabetes mellitus with unspecified diabetic retinopathy without macular edema: Secondary | ICD-10-CM

## 2022-01-07 LAB — GLUCOSE, RANDOM: Glucose, Bld: 127 mg/dL — ABNORMAL HIGH (ref 70–99)

## 2022-01-07 LAB — HEMOGLOBIN A1C: Hgb A1c MFr Bld: 6.4 % (ref 4.6–6.5)

## 2022-01-07 NOTE — Progress Notes (Signed)
?[facility] ?[facility] ?[place] ?[place] [address] ?Dept: [phone] ?Dept Fax: [phone] ? ?Chronic Care Office Visit ? ?Subjective:  ? ? Patient ID: Destiny Romero, female    DOB: 24-Feb-1951, 71 y.o..   MRN: [id number] ? ?Chief Complaint  ?Patient presents with  ? Follow-up  ?  F/u fatigue.   ? ? ?History of Present Illness: ? ?Patient is in today for reassessment of chronic medical issues. ? ?[name] has a history of type 2 diabetes. She is currently managed on glimepiride. She was previously on metformin, but had issues with dizziness. She notes her blood sugars have been erratic at home. Fasting sugars range from 67-200. ?  ?[name] continues to complain of fatigue, difficulty concentrating, increased sleepiness, and poor appetite. She relates some of this to possible long-COVID symptoms. She had COVID in December in both the past 2 years. ?  ?[name] has a history of mixed depression and anxiety. She is currently managed on lamotrigine by Dr. Barbie Banner. She had reached out to him with concerns that maybe the Lamictal was contributing to her daytime fatigue. She states he doubted this was the case. He told her he would send in some additional medication for her, but this has not arrived. ?  ?[name] has a history of hyperlipidemia and is managed on rosuvastatin and Lovaza (omega-3 fatty acid). ? ?Past Medical History: ?Patient Active Problem List  ? Diagnosis Date Noted  ? Arthritis of hand 11/21/2021  ? History of premalignant rectal tumor 11/13/2021  ? Degenerative lumbar spinal stenosis 03/03/2021  ? Lumbar radiculopathy 01/22/2021  ? Osteoarthritis of both hands 11/10/2020  ? Carpal tunnel syndrome of left wrist 04/11/2020  ? Gastroesophageal reflux disease 11/27/2019  ? Atypical chest pain 11/07/2018  ? BMI 29.0-29.9,adult 11/07/2018  ? Dyspnea 11/07/2018  ? Dupuytren's contracture of both hands 05/30/2018  ? Constipation 06/08/2016  ?  Decreased visual acuity 06/08/2016  ? Type 2 diabetes mellitus with retinopathy ([place]) 08/11/2013  ? Osteopenia 08/19/2012  ? Hyperlipidemia 06/27/2008  ? History of colonic polyps 06/27/2008  ? Depression with anxiety 01/19/2007  ? ?Past Surgical History:  ?Procedure Laterality Date  ? CARPAL TUNNEL RELEASE Right 05/2020  ? CATARACT EXTRACTION, BILATERAL Bilateral   ? cataracts  ? CERVICAL CONE BIOPSY    ? COLONOSCOPY  2003  ? COLONOSCOPY  04/01/2011  ? [place] - repeat in 5 years  ? COLONOSCOPY W/ POLYPECTOMY  2007  ? Dr Earlean Shawl  ? EXCISION MORTON'S NEUROMA Right   ? LAMINECTOMY    ? 1982  ? OOPHORECTOMY Left 12/2011  ? Dr Ubaldo Glassing ; ovarian cyst  ? POLYPECTOMY    ? RECTAL SURGERY  05/28/2006  ? pre-canerous lesion removed in 2007; Dr Morton Stall, [place]  ? TUBAL LIGATION    ? ?Family History  ?Problem Relation Age of Onset  ? Diabetes Mother   ? Dementia Mother   ? Prostate cancer Father   ? COPD Father   ? Heart disease Father   ?     CAD - used nitroglycerin tablets  ? Diabetes Sister   ?     obese  ? Thyroid cancer Sister   ? Diabetes Brother   ?     not obese  ? Lymphoma Brother   ?     NHL  ? Cancer Brother   ?     non Hodgkin's Lymphoma  ? Diabetes Brother   ? Diabetes Maternal Aunt   ?  X5 ; all IDDM  ? Heart disease Maternal Aunt   ?     "enlarged heart"  ? Heart disease Maternal Uncle   ?     "enlarged heart"  ? Diabetes Maternal Grandmother   ?     IDDM  ? Diabetes Paternal Grandmother   ?     IDDM  ? Heart disease Paternal Grandmother   ? Heart disease Paternal Grandfather   ?     CAD - used nitroglycerin tablets  ? Colon cancer Neg Hx   ? Esophageal cancer Neg Hx   ? Stomach cancer Neg Hx   ? Stroke Neg Hx   ? Colon polyps Neg Hx   ? Rectal cancer Neg Hx   ? ?Outpatient Medications Prior to Visit  ?Medication Sig Dispense Refill  ? Calcium 200 MG TABS Take by mouth. Tale [phone] mg daily    ? cetirizine (ZYRTEC) 10 MG tablet Take 10 mg by mouth daily.    ? Cholecalciferol (VITAMIN D) 2000 units CAPS daily.     ? clobetasol ointment (TEMOVATE) 0.05 % Apply a pea sized amount topically BID for up to 2 weeks as needed. 30 g 0  ? famotidine (PEPCID) 40 MG tablet TAKE 1 TABLET BY MOUTH EVERYDAY AT BEDTIME 90 tablet 1  ? FERROCITE 324 MG TABS tablet TAKE 1 TABLET (106 MG OF IRON TOTAL) BY MOUTH DAILY. 90 tablet 1  ? glimepiride (AMARYL) 1 MG tablet TAKE 1 TABLET BY MOUTH TWO A DAY WITH A MEAL 180 tablet 1  ? Glucosamine-Chondroitin 1500-1200 MG/30ML LIQD daily.    ? hydrOXYzine (ATARAX) 10 MG tablet Take 1 tablet (10 mg total) by mouth 3 (three) times daily as needed. 30 tablet 0  ? lamoTRIgine (LAMICTAL) 200 MG tablet Take 200 mg by mouth daily.    ? lidocaine (XYLOCAINE) 5 % ointment Apply 1 application. topically as needed. 30 g 0  ? Multiple Vitamin (MULTI VITAMIN) TABS daily.    ? omega-3 acid ethyl esters (LOVAZA) 1 g capsule Take by mouth daily.    ? OneTouch Delica Lancets 60A MISC Use to test blood sugar 3X daily.  Dx Code: E11.65 300 each 1  ? ONETOUCH VERIO test strip USE TO TEST BLOOD SUGAR 3X DAILY. DX CODE: E11.65 300 strip 12  ? rosuvastatin (CRESTOR) 5 MG tablet TAKE 1 TABLET (5 MG TOTAL) BY MOUTH 2 (TWO) TIMES A WEEK. 24 tablet 1  ? valACYclovir (VALTREX) 500 MG tablet Take one tablet po BID x 3 days prn outbreak 30 tablet 1  ? ?No facility-administered medications prior to visit.  ? ?Allergies  ?Allergen Reactions  ? Cortisone Palpitations, Other (See Comments) and Nausea And Vomiting  ?  hyperactivity ?hyperactivity  ? Short Ragweed Pollen Ext Itching  ? Grass Pollen(K-O-R-T-Swt Vern) Rash  ? Other Other (See Comments), Hives, Itching and Nausea Only  ?  Other reaction(s): Headache  ? Pseudoephedrine Other (See Comments)  ?  tachycardia ?tachycardia  ? Atorvastatin Other (See Comments)  ?  Low dose  ? Codeine   ? Fluoxetine Hcl Other (See Comments)  ? Metformin Other (See Comments)  ?  04/15/15 caused dizziness  ? Metformin And Related   ?  04/15/15 caused dizziness  ? Shellfish Allergy Other (See Comments)  ?   [other]  ?   ?Objective:  ? ?Today's Vitals  ? 01/07/22 5409  ?BP: 124/68  ?Pulse: 64  ?Temp: (!) 97.1 ?F (36.2 ?C)  ?TempSrc: Temporal  ?SpO2: 98%  ?Weight:  171 lb (77.6 kg)  ?Height: '5\' 3"'$  (1.6 m)  ? ?Body mass index is 30.29 kg/m?.  ? ?General: Well developed, well nourished. No acute distress. ?Psych: Alert and oriented. Normal mood and affect. ? ?There are no preventive care reminders to display for this patient. ? ?Lab Results ?Last CBC ?Lab Results  ?Component Value Date  ? WBC 7.9 11/19/2021  ? HGB 11.9 (L) 11/19/2021  ? HCT 34.8 (L) 11/19/2021  ? MCV 92.0 11/19/2021  ? MCH 30.0 05/20/2020  ? RDW 12.9 11/19/2021  ? PLT 241.0 11/19/2021  ? ?Last metabolic panel ?Lab Results  ?Component Value Date  ? GLUCOSE 82 11/19/2021  ? NA 142 11/19/2021  ? K 4.2 11/19/2021  ? CL 106 11/19/2021  ? CO2 31 11/19/2021  ? BUN 22 11/19/2021  ? CREATININE 1.15 11/19/2021  ? CALCIUM 9.6 11/19/2021  ? PROT 6.4 11/19/2021  ? ALBUMIN 4.3 11/19/2021  ? BILITOT 0.8 11/19/2021  ? ALKPHOS 47 11/19/2021  ? AST 23 11/19/2021  ? ALT 24 11/19/2021  ? ?Last lipids ?Lab Results  ?Component Value Date  ? CHOL 163 11/19/2021  ? HDL 65.50 11/19/2021  ? [place] 78 11/19/2021  ? LDLDIRECT 146.5 08/11/2013  ? TRIG 93.0 11/19/2021  ? CHOLHDL 2 11/19/2021  ? ?Last hemoglobin A1c ?Lab Results  ?Component Value Date  ? HGBA1C 7.1 (H) 11/19/2021  ? ?Last thyroid functions ?Lab Results  ?Component Value Date  ? TSH 2.07 11/19/2021  ?   ?Assessment & Plan:  ? ?1. Type 2 diabetes mellitus with right eye affected by retinopathy without macular edema, without long-term current use of insulin, unspecified retinopathy severity ([place]) ?Due for repeat HbA1c. This has been at goal. I will continue her on glimepiride 1 mg twice a day. ? ?- Glucose, random ?- Hemoglobin A1c ? ?2. Other fatigue ?Laboratory evaluation has not shown an etiology for the fatigue. This could represent long-COVID as Ms. Higley suspects. However, there is considerable overlap with symptoms  of uncontrolled depression. In light of her family history (her mother experienced similar symptoms over the years), I would suspect this as the more likely issue. ? ?3. Depression with anxiety ?As above. I

## 2022-01-08 ENCOUNTER — Other Ambulatory Visit: Payer: Self-pay

## 2022-01-08 MED ORDER — ONETOUCH VERIO VI STRP: ORAL_STRIP | 12 refills | Status: DC

## 2022-01-08 MED ORDER — ONETOUCH DELICA LANCETS 33G MISC: 1 refills | Status: DC

## 2022-01-08 NOTE — Telephone Encounter (Signed)
RX sent to the pharmacy and patient notified VIA through my chart. Dm/cma ? ?

## 2022-01-15 ENCOUNTER — Other Ambulatory Visit: Payer: Self-pay | Admitting: Nurse Practitioner

## 2022-01-15 DIAGNOSIS — N941 Unspecified dyspareunia: Secondary | ICD-10-CM

## 2022-01-15 DIAGNOSIS — N952 Postmenopausal atrophic vaginitis: Secondary | ICD-10-CM

## 2022-01-15 MED ORDER — ESTRADIOL 0.1 MG/GM VA CREA: 1.0000 g | TOPICAL_CREAM | VAGINAL | 1 refills | Status: DC

## 2022-01-16 ENCOUNTER — Telehealth: Payer: Self-pay | Admitting: Family Medicine

## 2022-01-16 NOTE — Telephone Encounter (Signed)
Left message for patient to call back and schedule Medicare Annual Wellness Visit (AWV).  ? ?Please offer to do virtually or by telephone.  Left office number and my jabber [phone]. ? ?Last AWV:03/21/2020 ? ?Please schedule at anytime with Nurse Health Advisor. ?  ?

## 2022-01-19 ENCOUNTER — Encounter: Payer: Self-pay | Admitting: Family Medicine

## 2022-01-21 ENCOUNTER — Telehealth: Payer: Self-pay | Admitting: *Deleted

## 2022-01-21 ENCOUNTER — Encounter: Payer: Self-pay | Admitting: Nurse Practitioner

## 2022-01-21 ENCOUNTER — Ambulatory Visit (INDEPENDENT_AMBULATORY_CARE_PROVIDER_SITE_OTHER): Payer: Medicare HMO | Admitting: Nurse Practitioner

## 2022-01-21 VITALS — BP 124/82

## 2022-01-21 DIAGNOSIS — N898 Other specified noninflammatory disorders of vagina: Secondary | ICD-10-CM | POA: Diagnosis not present

## 2022-01-21 DIAGNOSIS — N763 Subacute and chronic vulvitis: Secondary | ICD-10-CM | POA: Diagnosis not present

## 2022-01-21 DIAGNOSIS — L292 Pruritus vulvae: Secondary | ICD-10-CM | POA: Diagnosis not present

## 2022-01-21 LAB — WET PREP FOR TRICH, YEAST, CLUE

## 2022-01-21 MED ORDER — HYDROXYZINE HCL 10 MG PO TABS: 10.0000 mg | ORAL_TABLET | Freq: Three times a day (TID) | ORAL | 1 refills | Status: DC | PRN

## 2022-01-21 NOTE — Telephone Encounter (Signed)
PA done via cover my meds for hydroxyzine 10 mg tablet. [facility] approved Rx effective 01/2021-08/2022.  ?

## 2022-01-21 NOTE — Progress Notes (Signed)
? ?  Acute Office Visit ? ?Subjective:  ? ? Patient ID: Destiny Romero, female    DOB: 01-24-1951, 71 y.o.   MRN: [id number] ? ? ?HPI ?71 y.o. presents today for vulvar itching that feels like a "crawling sensation". These occur randomly and from mons pubis to anus. Small amount of discharge. These symptoms began after HSV outbreak and vaginal yeast infection in April. We discussed vaginal estrogen to help with atrophic vaginitis but she has not started. She used hydroxyzine with some relief but she ran out.  ? ? ?Review of Systems  ?Constitutional: Negative.   ?Genitourinary:  Positive for vaginal discharge. Negative for genital sores.  ?     Vulvar itching/irritation  ? ?   ?Objective:  ?  ?Physical Exam ?Constitutional:   ?   Appearance: Normal appearance.  ?Genitourinary: ?   Vagina: Vaginal discharge present. No erythema.  ?   Cervix: Normal.  ?   Comments: Atrophic changes ? ? ?BP 124/82   LMP 04/07/2001 (Approximate)  ?Wt Readings from Last 3 Encounters:  ?01/07/22 171 lb (77.6 kg)  ?01/01/22 168 lb (76.2 kg)  ?12/22/21 174 lb (78.9 kg)  ? ? ?   ? ?Patient informed chaperone available to be present for breast and pelvic exam. Patient has requested no chaperone to be present. Patient has been advised what will be completed during breast and pelvic exam.  ? ?Assessment & Plan:  ? ?Problem List Items Addressed This Visit   ?None ?Visit Diagnoses   ? ? Vulvar pruritus    -  Primary  ? Relevant Medications  ? hydrOXYzine (ATARAX) 10 MG tablet  ? Other Relevant Orders  ? Candida Albicans A(IGG,IGA,IGM)  ? WET PREP FOR [place], YEAST, CLUE  ? Vaginal discharge      ? Relevant Orders  ? WET PREP FOR [place], YEAST, CLUE  ? ?  ? ?Plan: Negative wet prep. Start vaginal estrogen - use weekly x 2 weeks, then decrease to twice weekly. Baking soda baths and apply coconut oil after. Hydroxyzine TID as needed. Symptoms likely from previous HSV outbreak. We did discuss option for gabapentin if no improvement. She is agreeable to  plan.  ? ? ? ? ?[employer], 9:52 AM 01/21/2022 ? ?

## 2022-01-22 ENCOUNTER — Ambulatory Visit: Payer: Medicare HMO | Admitting: Family Medicine

## 2022-01-22 ENCOUNTER — Other Ambulatory Visit: Payer: Self-pay | Admitting: *Deleted

## 2022-01-22 DIAGNOSIS — L292 Pruritus vulvae: Secondary | ICD-10-CM

## 2022-01-22 DIAGNOSIS — N898 Other specified noninflammatory disorders of vagina: Secondary | ICD-10-CM

## 2022-01-22 LAB — TIQ-NTM

## 2022-01-23 ENCOUNTER — Telehealth: Payer: Self-pay | Admitting: Family Medicine

## 2022-01-23 NOTE — Telephone Encounter (Signed)
Left message for patient to call back and schedule Medicare Annual Wellness Visit (AWV).   Please offer to do virtually or by telephone.  Left office number and my jabber [phone].  Last AWV:03/21/2020  Please schedule at anytime with Nurse Health Advisor.

## 2022-01-27 ENCOUNTER — Encounter: Payer: Self-pay | Admitting: Nurse Practitioner

## 2022-01-27 ENCOUNTER — Ambulatory Visit: Payer: Medicare HMO | Admitting: Nurse Practitioner

## 2022-01-27 LAB — SURESWAB® ADVANCED CANDIDA VAGINITIS (CV), TMA
CANDIDA SPECIES: NOT DETECTED
Candida glabrata: NOT DETECTED

## 2022-01-27 LAB — TEST AUTHORIZATION

## 2022-01-27 LAB — CANDIDA ALBICANS AB (IGG,IGA,IGM)

## 2022-01-30 ENCOUNTER — Telehealth: Payer: Self-pay | Admitting: Family Medicine

## 2022-01-30 NOTE — Telephone Encounter (Signed)
Left message for patient to call back and schedule Medicare Annual Wellness Visit (AWV).   Please offer to do virtually or by telephone.  Left office number and my jabber [phone].  Last AWV:03/21/2020  Please schedule at anytime with Nurse Health Advisor.

## 2022-02-09 ENCOUNTER — Encounter: Payer: Self-pay | Admitting: Nurse Practitioner

## 2022-02-16 DIAGNOSIS — F4310 Post-traumatic stress disorder, unspecified: Secondary | ICD-10-CM | POA: Diagnosis not present

## 2022-02-16 DIAGNOSIS — R69 Illness, unspecified: Secondary | ICD-10-CM | POA: Diagnosis not present

## 2022-02-16 DIAGNOSIS — F339 Major depressive disorder, recurrent, unspecified: Secondary | ICD-10-CM | POA: Diagnosis not present

## 2022-02-17 ENCOUNTER — Ambulatory Visit: Payer: Medicare HMO | Admitting: Family Medicine

## 2022-02-18 ENCOUNTER — Ambulatory Visit (INDEPENDENT_AMBULATORY_CARE_PROVIDER_SITE_OTHER): Payer: Medicare HMO

## 2022-02-18 DIAGNOSIS — Z0000 Encounter for general adult medical examination without abnormal findings: Secondary | ICD-10-CM | POA: Diagnosis not present

## 2022-02-18 NOTE — Progress Notes (Signed)
Subjective:   Destiny Romero is a 71 y.o. female who presents for Medicare Annual (Subsequent) preventive examination.   I connected with Winferd Humphrey  today by telephone and verified that I am speaking with the correct person using two identifiers. Location patient: home Location provider: work Persons participating in the virtual visit: patient, provider.   I discussed the limitations, risks, security and privacy concerns of performing an evaluation and management service by telephone and the availability of in person appointments. I also discussed with the patient that there may be a patient responsible charge related to this service. The patient expressed understanding and verbally consented to this telephonic visit.    Interactive audio and video telecommunications were attempted between this provider and patient, however failed, due to patient having technical difficulties OR patient did not have access to video capability.  We continued and completed visit with audio only.    Review of Systems     Cardiac Risk Factors include: advanced age (>42mn, >>108women)     Objective:    Today's Vitals   There is no height or weight on file to calculate BMI.     02/18/2022    9:13 AM 03/21/2020    8:06 AM 03/21/2019    8:28 AM 03/15/2018    8:17 AM 12/21/2016   11:45 AM 09/15/2016    1:34 PM 09/06/2014   10:04 AM  Advanced Directives  Does Patient Have a Medical Advance Directive? Yes Yes Yes Yes Yes Yes Yes  Type of [place] will [place] will [place]Indian Har[place] BeachLiving will HNinaLiving will [place]Living will  Living will;Out of facility DNR (pink MOST or yellow form)  Does patient want to make changes to medical advance directive?  No - Patient declined No - Patient declined      Copy of [place] Chart? No - copy requested No - copy  requested No - copy requested No - copy requested No - copy requested      Current Medications (verified) Outpatient Encounter Medications as of 02/18/2022  Medication Sig   Calcium 200 MG TABS Take by mouth. Tale [phone] mg daily   cetirizine (ZYRTEC) 10 MG tablet Take 10 mg by mouth daily.   Cholecalciferol (VITAMIN D) 2000 units CAPS daily.   clobetasol ointment (TEMOVATE) 0.05 % Apply a pea sized amount topically BID for up to 2 weeks as needed.   estradiol (ESTRACE VAGINAL) 0.1 MG/GM vaginal cream Place 1 g vaginally 2 (two) times a week.   famotidine (PEPCID) 40 MG tablet TAKE 1 TABLET BY MOUTH EVERYDAY AT BEDTIME   FERROCITE 324 MG TABS tablet TAKE 1 TABLET (106 MG OF IRON TOTAL) BY MOUTH DAILY.   glimepiride (AMARYL) 1 MG tablet TAKE 1 TABLET BY MOUTH TWO A DAY WITH A MEAL   Glucosamine-Chondroitin 1500-1200 MG/30ML LIQD daily.   glucose blood (ONETOUCH VERIO) test strip Use as instructed   hydrOXYzine (ATARAX) 10 MG tablet Take 1 tablet (10 mg total) by mouth 3 (three) times daily as needed.   lamoTRIgine (LAMICTAL) 200 MG tablet Take 200 mg by mouth daily.   lidocaine (XYLOCAINE) 5 % ointment Apply 1 application. topically as needed.   Multiple Vitamin (MULTI VITAMIN) TABS daily.   omega-3 acid ethyl esters (LOVAZA) 1 g capsule Take by mouth daily.   OneTouch Delica Lancets 341OMISC Use to test blood sugar 3X daily.  Dx Code:  E11.65   rosuvastatin (CRESTOR) 5 MG tablet TAKE 1 TABLET (5 MG TOTAL) BY MOUTH 2 (TWO) TIMES A WEEK.   valACYclovir (VALTREX) 500 MG tablet Take one tablet po BID x 3 days prn outbreak   No facility-administered encounter medications on file as of 02/18/2022.    Allergies (verified) Cortisone, Short ragweed pollen ext, Grass pollen(k-o-r-t-swt vern), Other, Pseudoephedrine, Atorvastatin, Codeine, Fluoxetine hcl, Metformin, Metformin and related, and Shellfish allergy   History: Past Medical History:  Diagnosis Date   Allergy    Anal lesion 2007    Anemia    Anesthesia complication    Per pt/ sensitive to sedation!   Arthritis    fingers,toes   Cancer ([place])    "PRECANCEROUS LESIONS IN RECTUM"   Cataract    bilateral,removed   Central retinal artery occlusion 06/08/2016   limited vision right eye.   Cervical dysplasia 04/1991   Dr Rica Koyanagi - CIN I, [facility] and freeze in 1990s with normal paps since   Constipation 06/08/2016   moves bowels every 3 rd day    Decreased visual acuity 06/08/2016   Retinal bleed right eye 2017   Depression    Diabetes mellitus without complication ([place])    Fatigue 06/27/2017   H/O measles    H/O mumps    History of chicken pox    Hyperlipidemia    Mitral valve prolapse    NO MR; no SBE prophylaxis needed   Osteoporosis    PONV (postoperative nausea and vomiting)    Pre-diabetes    highest A1c 6.4 %   Preventative health care 12/21/2016   Squamous cell carcinoma in situ of skin    Dr [place]   STD (sexually transmitted disease)    HSV   Vision impairment    limited vision in right eye.   Past Surgical History:  Procedure Laterality Date   CARPAL TUNNEL RELEASE Right 05/2020   CATARACT EXTRACTION, BILATERAL Bilateral    cataracts   CERVICAL CONE BIOPSY     COLONOSCOPY  2003   COLONOSCOPY  04/01/2011   [place] - repeat in 5 years   COLONOSCOPY W/ POLYPECTOMY  2007   Dr Earlean Shawl   EXCISION MORTON'S NEUROMA Right    LAMINECTOMY     1982   OOPHORECTOMY Left 12/2011   Dr Ubaldo Glassing ; ovarian cyst   POLYPECTOMY     RECTAL SURGERY  05/28/2006   pre-canerous lesion removed in 2007; Dr Morton Stall, [facility]   TUBAL LIGATION     Family History  Problem Relation Age of Onset   Diabetes Mother    Dementia Mother    Prostate cancer Father    COPD Father    Heart disease Father        CAD - used nitroglycerin tablets   Diabetes Sister        obese   Thyroid cancer Sister    Diabetes Brother        not obese   Lymphoma Brother        NHL   Cancer Brother        non Hodgkin's Lymphoma   Diabetes  Brother    Diabetes Maternal Aunt        X5 ; all IDDM   Heart disease Maternal Aunt        "enlarged heart"   Heart disease Maternal Uncle        "enlarged heart"   Diabetes Maternal Grandmother        IDDM   Diabetes Paternal  Grandmother        IDDM   Heart disease Paternal Grandmother    Heart disease Paternal Grandfather        CAD - used nitroglycerin tablets   Colon cancer Neg Hx    Esophageal cancer Neg Hx    Stomach cancer Neg Hx    Stroke Neg Hx    Colon polyps Neg Hx    Rectal cancer Neg Hx    Social History   Socioeconomic History   Marital status: Single    Spouse name: Not on file   Number of children: 0   Years of education: Not on file   Highest education level: Not on file  Occupational History   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  Tobacco Use   Smoking status: Never   Smokeless tobacco: Never  Vaping Use   Vaping Use: Never used  Substance and Sexual Activity   Alcohol use: Yes    Comment: glass wine once a month   Drug use: No   Sexual activity: Yes    Partners: Male    Comment: 1st intercourse 71 yo-More than 5 partners  Other Topics Concern   Not on file  Social History Narrative   Lives alone   No dietary restrictions   Social Determinants of Health   Financial Resource Strain: Low Risk  (02/18/2022)   Overall Financial Resource Strain (CARDIA)    Difficulty of Paying Living Expenses: Not hard at all  Food Insecurity: No Food Insecurity (02/18/2022)   Hunger Vital Sign    Worried About Running Out of Food in the Last Year: Never true    Ran Out of Food in the Last Year: Never true  Transportation Needs: No Transportation Needs (02/18/2022)   PRAPARE - Hydrologist (Medical): No    Lack of Transportation (Non-Medical): No  Physical Activity: Insufficiently Active (02/18/2022)   Exercise Vital Sign    Days of Exercise per Week: 3 days    Minutes of Exercise per Session: 30 min  Stress: No Stress Concern  Present (02/18/2022)   [place]    Feeling of Stress : Not at all  Social Connections: Moderately Integrated (02/18/2022)   Social Connection and Isolation Panel [NHANES]    Frequency of Communication with Friends and Family: Three times a week    Frequency of Social Gatherings with Friends and Family: Three times a week    Attends Religious Services: More than 4 times per year    Active Member of Clubs or Organizations: Yes    Attends Music therapist: More than 4 times per year    Marital Status: Divorced    Tobacco Counseling Counseling given: Not Answered   Clinical Intake:  Pre-visit preparation completed: Yes  Pain : No/denies pain     Nutritional Risks: None Diabetes: No  How often do you need to have someone help you when you read instructions, pamphlets, or other written materials from your doctor or pharmacy?: 1 - Never What is the last grade level you completed in school?: BS  Diabetic?yes Nutrition Risk Assessment:  Has the patient had any N/V/D within the last 2 months?  No  Does the patient have any non-healing wounds?  No  Has the patient had any unintentional weight loss or weight gain?  No   Diabetes:  Is the patient diabetic?  Yes  If diabetic, was a CBG obtained today?  No  Did the  patient bring in their glucometer from home?  No  How often do you monitor your CBG's? 3 x day .   Financial Strains and Diabetes Management:  Are you having any financial strains with the device, your supplies or your medication? No .  Does the patient want to be seen by Chronic Care Management for management of their diabetes?  No  Would the patient like to be referred to a Nutritionist or for Diabetic Management?  No   Diabetic Exams:  Diabetic Eye Exam: Completed 03/2021 Diabetic Foot Exam: Overdue, Pt has been advised about the importance in completing this exam. Pt is scheduled for  diabetic foot exam on next office visit .   Interpreter Needed?: No  Information entered by :: [email or web address]   Activities of Daily Living    02/18/2022    9:16 AM  In your present state of health, do you have any difficulty performing the following activities:  Hearing? 0  Vision? 0  Difficulty concentrating or making decisions? 0  Walking or climbing stairs? 0  Dressing or bathing? 0  Doing errands, shopping? 0  Preparing Food and eating ? N  Using the Toilet? N  In the past six months, have you accidently leaked urine? N  Do you have problems with loss of bowel control? N  Managing your Medications? N  Managing your Finances? N  Housekeeping or managing your Housekeeping? N    Patient Care Team: Haydee Salter, MD as PCP - General (Family Medicine) Philemon Kingdom, MD as Consulting Physician (Internal Medicine) Milus Banister, MD as Consulting Physician (Gastroenterology) Eliseo Gum, MD as Consulting Physician (Psychiatry) Rolm Bookbinder, MD as Consulting Physician (Dermatology) Hayden Pedro, MD as Consulting Physician (Ophthalmology) Salvadore Dom, MD as Consulting Physician (Obstetrics and Gynecology) Darleen Crocker, MD as Consulting Physician (Ophthalmology) Susa Day, MD as Consulting Physician (Orthopedic Surgery) Iran Planas, MD as Consulting Physician (Orthopedic Surgery) Erle Crocker, MD as Consulting Physician (Orthopedic Surgery) Jola Schmidt, MD as Consulting Physician (Ophthalmology)  Indicate any recent Medical Services you may have received from other than [facility] providers in the past year (date may be approximate).     Assessment:   This is a routine wellness examination for Maanvi.  Hearing/Vision screen Vision Screening - Comments:: Annual eye exams wear glasses   Dietary issues and exercise activities discussed: Current Exercise Habits: Home exercise routine, Type of exercise: walking, Time (Minutes): 30,  Frequency (Times/Week): 3, Weekly Exercise (Minutes/Week): 90, Intensity: Mild, Exercise limited by: None identified   Goals Addressed               This Visit's Progress     <enter goal here> (pt-stated)   On track     Have less stress.       Depression Screen    02/18/2022    9:14 AM 02/18/2022    9:12 AM 11/13/2021    8:19 AM 11/05/2020    3:00 PM 03/21/2020    8:10 AM 03/14/2020   11:41 AM 03/21/2019    8:10 AM  PHQ 2/9 Scores  PHQ - 2 Score 0 0 4 0 '2 4 2  '$ PHQ- 9 Score   '12  6 18 8    '$ Fall Risk    02/18/2022    9:14 AM 11/13/2021    7:57 AM 08/15/2021    1:56 PM 03/21/2020    8:10 AM 03/14/2020    8:46 AM  Fall Risk   Falls in the past year? 0  0 0 0 0  Number falls in past yr: 0 0 0 0   Injury with Fall? 0 0 0 0   Risk for fall due to :  No Fall Risks History of fall(s)    Follow up Falls evaluation completed Falls evaluation completed Falls evaluation completed Education provided;Falls prevention discussed     FALL RISK PREVENTION PERTAINING TO THE HOME:  Any stairs in or around the home? No  If so, are there any without handrails? No  Home free of loose throw rugs in walkways, pet beds, electrical cords, etc? Yes  Adequate lighting in your home to reduce risk of falls? Yes   ASSISTIVE DEVICES UTILIZED TO PREVENT FALLS:  Life alert? No  Use of a cane, walker or w/c? No  Grab bars in the bathroom? No  Shower chair or bench in shower? Yes  Elevated toilet seat or a handicapped toilet? No     Cognitive Function:    Normal cognitive status assessed by telephone conversation  by this Nurse Health Advisor. No abnormalities found.      Immunizations Immunization History  Administered Date(s) Administered   Fluad Quad(high Dose 65+) 05/11/2019, 08/15/2021   Hepatitis B 01/27/2013   Influenza Split 06/28/2012   Influenza, High Dose Seasonal PF 10/05/2016, 06/15/2017, 06/16/2018   Influenza,inj,Quad PF,6+ Mos 07/25/2013, 06/14/2014, 07/11/2015    Influenza-Unspecified 06/16/2018   PFIZER(Purple Top)SARS-COV-2 Vaccination 11/02/2019, 12/01/2019   Pneumococcal Conjugate-13 06/16/2018   Pneumococcal Polysaccharide-23 03/20/2021   Tdap 02/03/2013   Zoster Recombinat (Shingrix) 12/29/2016, 03/02/2017   Zoster, Live 04/22/2010    TDAP status: Up to date  Flu Vaccine status: Up to date  Pneumococcal vaccine status: Up to date  Covid-19 vaccine status: Completed vaccines  Qualifies for Shingles Vaccine? Yes   Zostavax completed Yes   Shingrix Completed?: Yes  Screening Tests Health Maintenance  Topic Date Due   COVID-19 Vaccine (3 - Pfizer series) 01/26/2020   URINE MICROALBUMIN  03/20/2022   OPHTHALMOLOGY EXAM  03/14/2022   INFLUENZA VACCINE  04/07/2022   HEMOGLOBIN A1C  07/10/2022   MAMMOGRAM  08/08/2022   FOOT EXAM  08/15/2022   TETANUS/TDAP  02/04/2023   COLONOSCOPY (Pts 45-39yr Insurance coverage will need to be confirmed)  10/17/2026   Pneumonia Vaccine 71 Years old  Completed   DEXA SCAN  Completed   Hepatitis C Screening  Completed   Zoster Vaccines- Shingrix  Completed   HPV VACCINES  Aged Out    Health Maintenance  Health Maintenance Due  Topic Date Due   COVID-19 Vaccine (3 - Pfizer series) 01/26/2020   URINE MICROALBUMIN  03/20/2022    Colorectal cancer screening: Type of screening: Colonoscopy. Completed 10/17/2021. Repeat every 5 years  Mammogram status: Completed 08/08/2021. Repeat every year  Bone Density status: Completed 10/07/2021. Results reflect: Bone density results: OSTEOPENIA. Repeat every 5 years.  Lung Cancer Screening: (Low Dose CT Chest recommended if Age 73-80years, 30 pack-year currently smoking OR have quit w/in 15years.) does not qualify.   Lung Cancer Screening Referral: n/a  Additional Screening:  Hepatitis C Screening: does not qualify; Completed 12/18/2021  Vision Screening: Recommended annual ophthalmology exams for early detection of glaucoma and other disorders of  the eye. Is the patient up to date with their annual eye exam?  Yes  Who is the provider or what is the name of the office in which the patient attends annual eye exams? Dr.Bowan  If pt is not established with a provider, would they like to be  referred to a provider to establish care? No .   Dental Screening: Recommended annual dental exams for proper oral hygiene  Community Resource Referral / Chronic Care Management: CRR required this visit?  No   CCM required this visit?  No      Plan:     I have personally reviewed and noted the following in the patient's chart:   Medical and social history Use of alcohol, tobacco or illicit drugs  Current medications and supplements including opioid prescriptions.  Functional ability and status Nutritional status Physical activity Advanced directives List of other physicians Hospitalizations, surgeries, and ER visits in previous 12 months Vitals Screenings to include cognitive, depression, and falls Referrals and appointments  In addition, I have reviewed and discussed with patient certain preventive protocols, quality metrics, and best practice recommendations. A written personalized care plan for preventive services as well as general preventive health recommendations were provided to patient.     Randel Pigg, LPN   [id number]/[id number]   Nurse Notes: none

## 2022-02-18 NOTE — Patient Instructions (Signed)
Destiny Romero , Thank you for taking time to come for your Medicare Wellness Visit. I appreciate your ongoing commitment to your health goals. Please review the following plan we discussed and let me know if I can assist you in the future.   Screening recommendations/referrals: Colonoscopy: 10/17/2021 Mammogram: 08/08/2021 Bone Density: 10/07/2021 Recommended yearly ophthalmology/optometry visit for glaucoma screening and checkup Recommended yearly dental visit for hygiene and checkup  Vaccinations: Influenza vaccine: completed  Pneumococcal vaccine: completed  Tdap vaccine: 02/03/2013 Shingles vaccine: completed     Advanced directives: yes   Conditions/risks identified: none   Next appointment: none    Preventive Care 73 Years and Older, Female Preventive care refers to lifestyle choices and visits with your health care provider that can promote health and wellness. What does preventive care include? A yearly physical exam. This is also called an annual well check. Dental exams once or twice a year. Routine eye exams. Ask your health care provider how often you should have your eyes checked. Personal lifestyle choices, including: Daily care of your teeth and gums. Regular physical activity. Eating a healthy diet. Avoiding tobacco and drug use. Limiting alcohol use. Practicing safe sex. Taking low-dose aspirin every day. Taking vitamin and mineral supplements as recommended by your health care provider. What happens during an annual well check? The services and screenings done by your health care provider during your annual well check will depend on your age, overall health, lifestyle risk factors, and family history of disease. Counseling  Your health care provider may ask you questions about your: Alcohol use. Tobacco use. Drug use. Emotional well-being. Home and relationship well-being. Sexual activity. Eating habits. History of falls. Memory and ability to  understand (cognition). Work and work Statistician. Reproductive health. Screening  You may have the following tests or measurements: Height, weight, and BMI. Blood pressure. Lipid and cholesterol levels. These may be checked every 5 years, or more frequently if you are over 88 years old. Skin check. Lung cancer screening. You may have this screening every year starting at age 68 if you have a 30-pack-year history of smoking and currently smoke or have quit within the past 15 years. Fecal occult blood test (FOBT) of the stool. You may have this test every year starting at age 21. Flexible sigmoidoscopy or colonoscopy. You may have a sigmoidoscopy every 5 years or a colonoscopy every 10 years starting at age 89. Hepatitis C blood test. Hepatitis B blood test. Sexually transmitted disease (STD) testing. Diabetes screening. This is done by checking your blood sugar (glucose) after you have not eaten for a while (fasting). You may have this done every 1-3 years. Bone density scan. This is done to screen for osteoporosis. You may have this done starting at age 33. Mammogram. This may be done every 1-2 years. Talk to your health care provider about how often you should have regular mammograms. Talk with your health care provider about your test results, treatment options, and if necessary, the need for more tests. Vaccines  Your health care provider may recommend certain vaccines, such as: Influenza vaccine. This is recommended every year. Tetanus, diphtheria, and acellular pertussis (Tdap, Td) vaccine. You may need a Td booster every 10 years. Zoster vaccine. You may need this after age 32. Pneumococcal 13-valent conjugate (PCV13) vaccine. One dose is recommended after age 72. Pneumococcal polysaccharide (PPSV23) vaccine. One dose is recommended after age 40. Talk to your health care provider about which screenings and vaccines you need and how often you  need them. This information is not  intended to replace advice given to you by your health care provider. Make sure you discuss any questions you have with your health care provider. Document Released: 09/20/2015 Document Revised: 05/13/2016 Document Reviewed: 06/25/2015 Elsevier Interactive Patient Education  2017 [place] Prevention in the Home Falls can cause injuries. They can happen to people of all ages. There are many things you can do to make your home safe and to help prevent falls. What can I do on the outside of my home? Regularly fix the edges of walkways and driveways and fix any cracks. Remove anything that might make you trip as you walk through a door, such as a raised step or threshold. Trim any bushes or trees on the path to your home. Use bright outdoor lighting. Clear any walking paths of anything that might make someone trip, such as rocks or tools. Regularly check to see if handrails are loose or broken. Make sure that both sides of any steps have handrails. Any raised decks and porches should have guardrails on the edges. Have any leaves, snow, or ice cleared regularly. Use sand or salt on walking paths during winter. Clean up any spills in your garage right away. This includes oil or grease spills. What can I do in the bathroom? Use night lights. Install grab bars by the toilet and in the tub and shower. Do not use towel bars as grab bars. Use non-skid mats or decals in the tub or shower. If you need to sit down in the shower, use a plastic, non-slip stool. Keep the floor dry. Clean up any water that spills on the floor as soon as it happens. Remove soap buildup in the tub or shower regularly. Attach bath mats securely with double-sided non-slip rug tape. Do not have throw rugs and other things on the floor that can make you trip. What can I do in the bedroom? Use night lights. Make sure that you have a light by your bed that is easy to reach. Do not use any sheets or blankets that are  too big for your bed. They should not hang down onto the floor. Have a firm chair that has side arms. You can use this for support while you get dressed. Do not have throw rugs and other things on the floor that can make you trip. What can I do in the kitchen? Clean up any spills right away. Avoid walking on wet floors. Keep items that you use a lot in easy-to-reach places. If you need to reach something above you, use a strong step stool that has a grab bar. Keep electrical cords out of the way. Do not use floor polish or wax that makes floors slippery. If you must use wax, use non-skid floor wax. Do not have throw rugs and other things on the floor that can make you trip. What can I do with my stairs? Do not leave any items on the stairs. Make sure that there are handrails on both sides of the stairs and use them. Fix handrails that are broken or loose. Make sure that handrails are as long as the stairways. Check any carpeting to make sure that it is firmly attached to the stairs. Fix any carpet that is loose or worn. Avoid having throw rugs at the top or bottom of the stairs. If you do have throw rugs, attach them to the floor with carpet tape. Make sure that you have a light switch  at the top of the stairs and the bottom of the stairs. If you do not have them, ask someone to add them for you. What else can I do to help prevent falls? Wear shoes that: Do not have high heels. Have rubber bottoms. Are comfortable and fit you well. Are closed at the toe. Do not wear sandals. If you use a stepladder: Make sure that it is fully opened. Do not climb a closed stepladder. Make sure that both sides of the stepladder are locked into place. Ask someone to hold it for you, if possible. Clearly mark and make sure that you can see: Any grab bars or handrails. First and last steps. Where the edge of each step is. Use tools that help you move around (mobility aids) if they are needed. These  include: Canes. Walkers. Scooters. Crutches. Turn on the lights when you go into a dark area. Replace any light bulbs as soon as they burn out. Set up your furniture so you have a clear path. Avoid moving your furniture around. If any of your floors are uneven, fix them. If there are any pets around you, be aware of where they are. Review your medicines with your doctor. Some medicines can make you feel dizzy. This can increase your chance of falling. Ask your doctor what other things that you can do to help prevent falls. This information is not intended to replace advice given to you by your health care provider. Make sure you discuss any questions you have with your health care provider. Document Released: 06/20/2009 Document Revised: 01/30/2016 Document Reviewed: 09/28/2014 Elsevier Interactive Patient Education  2017 [employer].

## 2022-03-03 ENCOUNTER — Encounter: Payer: Self-pay | Admitting: Nurse Practitioner

## 2022-03-03 ENCOUNTER — Other Ambulatory Visit: Payer: Self-pay | Admitting: Nurse Practitioner

## 2022-03-03 DIAGNOSIS — L292 Pruritus vulvae: Secondary | ICD-10-CM

## 2022-03-03 DIAGNOSIS — B0229 Other postherpetic nervous system involvement: Secondary | ICD-10-CM

## 2022-03-03 MED ORDER — GABAPENTIN 300 MG PO CAPS: 300.0000 mg | ORAL_CAPSULE | Freq: Two times a day (BID) | ORAL | 1 refills | Status: DC

## 2022-03-06 ENCOUNTER — Other Ambulatory Visit: Payer: Self-pay | Admitting: Family Medicine

## 2022-03-06 ENCOUNTER — Other Ambulatory Visit: Payer: Self-pay | Admitting: Nurse Practitioner

## 2022-03-06 DIAGNOSIS — A609 Anogenital herpesviral infection, unspecified: Secondary | ICD-10-CM

## 2022-03-06 MED ORDER — VALACYCLOVIR HCL 500 MG PO TABS: 500.0000 mg | ORAL_TABLET | Freq: Every day | ORAL | 0 refills | Status: DC

## 2022-03-27 ENCOUNTER — Encounter: Payer: Self-pay | Admitting: Nurse Practitioner

## 2022-03-31 ENCOUNTER — Ambulatory Visit (INDEPENDENT_AMBULATORY_CARE_PROVIDER_SITE_OTHER): Payer: Medicare HMO | Admitting: Nurse Practitioner

## 2022-03-31 ENCOUNTER — Encounter: Payer: Self-pay | Admitting: Nurse Practitioner

## 2022-03-31 VITALS — BP 108/70 | HR 66

## 2022-03-31 DIAGNOSIS — R5383 Other fatigue: Secondary | ICD-10-CM | POA: Diagnosis not present

## 2022-03-31 DIAGNOSIS — L292 Pruritus vulvae: Secondary | ICD-10-CM | POA: Diagnosis not present

## 2022-03-31 MED ORDER — HYDROXYZINE HCL 10 MG PO TABS: 10.0000 mg | ORAL_TABLET | Freq: Three times a day (TID) | ORAL | 1 refills | Status: DC | PRN

## 2022-03-31 NOTE — Progress Notes (Signed)
   Acute Office Visit  Subjective:    Patient ID: Destiny Romero, female    DOB: 1951/07/13, 71 y.o.   MRN: [id number]   HPI 71 y.o. presents today for suspected postherpetic neuralgia of vulva. She has been using Hydroxyzine 1-2 times per day with good management. Uses lidocaine gel as needed. Feels the crawling sensation is very slowly getting better but is affecting her quality of life. She now reports that she is feeling an itching sensation on arms and legs. She is wondering if it is her allergies. She is always on antihistamines and when she does not take she experiences these sensations. Reports sedation with gabapentin. Discussed option for low dose Nortriptyline but she is nervous about taking and wanted to discuss with psychiatrist first who approved use. She has appointment with Dr. Gena Fray next week as well and wants to discuss with him.    Review of Systems  Constitutional:  Positive for fatigue.  Genitourinary:  Positive for vaginal pain (crawling sensation of vulva). Negative for vaginal bleeding and vaginal discharge.  Skin:        Itching, crawling sensations       Objective:    Physical Exam Constitutional:      Appearance: Normal appearance.   GU: Not indicated  BP 108/70   Pulse 66   LMP 04/07/2001 (Approximate)   SpO2 97%  Wt Readings from Last 3 Encounters:  01/07/22 171 lb (77.6 kg)  01/01/22 168 lb (76.2 kg)  12/22/21 174 lb (78.9 kg)        Patient informed chaperone available to be present for breast and/or pelvic exam. Patient has requested no chaperone to be present. Patient has been advised what will be completed during breast and pelvic exam.   Assessment & Plan:   Problem List Items Addressed This Visit   None Visit Diagnoses     Fatigue, unspecified type    -  Primary   Relevant Orders   Vitamin B12   VITAMIN D 25 Hydroxy (Vit-D Deficiency, Fractures)   Comprehensive metabolic panel   Vulvar pruritus       Relevant Medications    hydrOXYzine (ATARAX) 10 MG tablet      Plan: Will check for vitamin deficiencies. She plans to discuss with PCP next week as well. Will provide Hydroxyzine refill in the meantime. She will consider Nortriptyline.  Continue lidocaine gel as needed.      [place], 1:53 PM 03/31/2022

## 2022-04-01 ENCOUNTER — Encounter: Payer: Self-pay | Admitting: Nurse Practitioner

## 2022-04-01 ENCOUNTER — Other Ambulatory Visit: Payer: Self-pay | Admitting: Nurse Practitioner

## 2022-04-01 DIAGNOSIS — R7989 Other specified abnormal findings of blood chemistry: Secondary | ICD-10-CM

## 2022-04-01 LAB — COMPREHENSIVE METABOLIC PANEL
AG Ratio: 1.9 (calc) (ref 1.0–2.5)
ALT: 20 U/L (ref 6–29)
AST: 20 U/L (ref 10–35)
Albumin: 4.4 g/dL (ref 3.6–5.1)
Alkaline phosphatase (APISO): 48 U/L (ref 37–153)
BUN/Creatinine Ratio: 18 (calc) (ref 6–22)
BUN: 25 mg/dL (ref 7–25)
CO2: 26 mmol/L (ref 20–32)
Calcium: 9.3 mg/dL (ref 8.6–10.4)
Chloride: 105 mmol/L (ref 98–110)
Creat: 1.42 mg/dL — ABNORMAL HIGH (ref 0.60–1.00)
Globulin: 2.3 g/dL (calc) (ref 1.9–3.7)
Glucose, Bld: 171 mg/dL — ABNORMAL HIGH (ref 65–99)
Potassium: 4.7 mmol/L (ref 3.5–5.3)
Sodium: 140 mmol/L (ref 135–146)
Total Bilirubin: 0.6 mg/dL (ref 0.2–1.2)
Total Protein: 6.7 g/dL (ref 6.1–8.1)

## 2022-04-01 LAB — VITAMIN D 25 HYDROXY (VIT D DEFICIENCY, FRACTURES): Vit D, 25-Hydroxy: 43 ng/mL (ref 30–100)

## 2022-04-01 LAB — EXTRA SPECIMEN

## 2022-04-01 LAB — VITAMIN B12: Vitamin B-12: 684 pg/mL (ref 200–1100)

## 2022-04-01 NOTE — Telephone Encounter (Signed)
Patient send another message saying " Or Naproxen. I've been taking that 2x daily since March. Before that alternating with Tylenol for a year or more.    I drink a lot of water so that is prolly not the issue.  sigh.Marland KitchenMarland KitchenMarland Kitchen

## 2022-04-09 ENCOUNTER — Encounter: Payer: Self-pay | Admitting: Family Medicine

## 2022-04-09 ENCOUNTER — Ambulatory Visit (INDEPENDENT_AMBULATORY_CARE_PROVIDER_SITE_OTHER): Payer: Medicare HMO | Admitting: Family Medicine

## 2022-04-09 VITALS — BP 118/66 | HR 59 | Temp 97.8°F | Ht 63.0 in | Wt 161.2 lb

## 2022-04-09 DIAGNOSIS — B009 Herpesviral infection, unspecified: Secondary | ICD-10-CM | POA: Diagnosis not present

## 2022-04-09 DIAGNOSIS — E11319 Type 2 diabetes mellitus with unspecified diabetic retinopathy without macular edema: Secondary | ICD-10-CM

## 2022-04-09 DIAGNOSIS — E785 Hyperlipidemia, unspecified: Secondary | ICD-10-CM | POA: Diagnosis not present

## 2022-04-09 DIAGNOSIS — J310 Chronic rhinitis: Secondary | ICD-10-CM | POA: Diagnosis not present

## 2022-04-09 DIAGNOSIS — D649 Anemia, unspecified: Secondary | ICD-10-CM | POA: Diagnosis not present

## 2022-04-09 LAB — MICROALBUMIN / CREATININE URINE RATIO
Creatinine,U: 72.6 mg/dL
Microalb Creat Ratio: 1 mg/g (ref 0.0–30.0)
Microalb, Ur: <0.7 mg/dL (ref 0.0–1.9)

## 2022-04-09 LAB — BASIC METABOLIC PANEL
BUN: 22 mg/dL (ref 6–23)
CO2: 29 mEq/L (ref 19–32)
Calcium: 9.3 mg/dL (ref 8.4–10.5)
Chloride: 104 mEq/L (ref 96–112)
Creatinine, Ser: 1.04 mg/dL (ref 0.40–1.20)
GFR: 53.64 mL/min — ABNORMAL LOW (ref >60.00)
Glucose, Bld: 141 mg/dL — ABNORMAL HIGH (ref 70–99)
Potassium: 3.9 mEq/L (ref 3.5–5.1)
Sodium: 140 mEq/L (ref 135–145)

## 2022-04-09 LAB — HEMOGLOBIN A1C: Hgb A1c MFr Bld: 6.8 % — ABNORMAL HIGH (ref 4.6–6.5)

## 2022-04-09 MED ORDER — FERROUS FUMARATE 324 (106 FE) MG PO TABS: 106.0000 mg | ORAL_TABLET | Freq: Every day | ORAL | 1 refills | Status: DC

## 2022-04-09 MED ORDER — AZELASTINE HCL 0.1 % NA SOLN: 1.0000 | Freq: Two times a day (BID) | NASAL | 12 refills | Status: DC

## 2022-04-09 MED ORDER — GLIMEPIRIDE 1 MG PO TABS
1.0000 mg | ORAL_TABLET | Freq: Two times a day (BID) | ORAL | 3 refills | Status: DC
Start: 2022-04-09 — End: 2022-11-09

## 2022-04-09 NOTE — Progress Notes (Signed)
[facility] [address] [place] [address] Dept: [phone] Dept Fax: [phone]  Chronic Care Office Visit  Subjective:    Patient ID: Destiny Romero, female    DOB: Apr 18, 1951, 71 y.o..   MRN: [id number]  Chief Complaint  Patient presents with   Follow-up    3 month f/u. No concerns. Fasting today. Average BS 150   ? Which antihistamines can she take that will continue to work    History of Present Illness:  Patient is in today for reassessment of chronic medical issues.  Destiny Romero has a history of type 2 diabetes. She is currently managed on glimepiride 1 mg bid. She was previously on metformin, but had issues with dizziness. Her last A1c was in excellent control. She has lost 10 lbs since then by reducing sweets, sodas, caffeine, and "white" foods.   Destiny Romero continues to complain of fatigue. We had felt there may be a component of long COVID involved. She has been using antihistamines more frequently (hydroxyzine). As well, she has had issues with chronic rhinitis. She is worried the hydroxyzine may be contributing to her fatigue.  Destiny Romero was diagnosed by her GYN with an outbreak of genital herpes. Her last sexual encounter was more than 4 years ago. She was not aware that she had been previously infected. She does have a plan for use of Valtrex with future outbreaks if needed.   Destiny Romero has a history of hyperlipidemia and is managed on rosuvastatin 5 mg daily and Lovaza (omega-3 fatty acid) 1 gm daily.  Past Medical History: Patient Active Problem List   Diagnosis Date Noted   HSV-2 (herpes simplex virus 2) infection 04/09/2022   Chronic rhinitis 04/09/2022   Posttraumatic stress disorder 01/07/2022   Arthritis of hand 11/21/2021   History of premalignant rectal tumor 11/13/2021   Degenerative lumbar spinal stenosis 03/03/2021   Lumbar radiculopathy 01/22/2021   Osteoarthritis of both hands  11/10/2020   Carpal tunnel syndrome of left wrist 04/11/2020   Gastroesophageal reflux disease 11/27/2019   Atypical chest pain 11/07/2018   BMI 29.0-29.9,adult 11/07/2018   Dyspnea 11/07/2018   Dupuytren's contracture of both hands 05/30/2018   Constipation 06/08/2016   Decreased visual acuity 06/08/2016   Type 2 diabetes mellitus with retinopathy ([place]) 08/11/2013   Osteopenia 08/19/2012   Hyperlipidemia 06/27/2008   History of colonic polyps 06/27/2008   Recurrent major depressive disorder ([place]) 01/19/2007   Past Surgical History:  Procedure Laterality Date   CARPAL TUNNEL RELEASE Right 05/2020   CATARACT EXTRACTION, BILATERAL Bilateral    cataracts   CERVICAL CONE BIOPSY     COLONOSCOPY  2003   COLONOSCOPY  04/01/2011   [place] - repeat in 5 years   COLONOSCOPY W/ POLYPECTOMY  2007   Dr Earlean Shawl   EXCISION MORTON'S NEUROMA Right    LAMINECTOMY     1982   OOPHORECTOMY Left 12/2011   Dr Ubaldo Glassing ; ovarian cyst   POLYPECTOMY     RECTAL SURGERY  05/28/2006   pre-canerous lesion removed in 2007; Dr Morton Stall, [place]   TUBAL LIGATION     Family History  Problem Relation Age of Onset   Diabetes Mother    Dementia Mother    Prostate cancer Father    COPD Father    Heart disease Father        CAD - used nitroglycerin tablets   Diabetes Sister        obese   Thyroid  cancer Sister    Diabetes Brother        not obese   Lymphoma Brother        NHL   Cancer Brother        non Hodgkin's Lymphoma   Diabetes Brother    Diabetes Maternal Aunt        X5 ; all IDDM   Heart disease Maternal Aunt        "enlarged heart"   Heart disease Maternal Uncle        "enlarged heart"   Diabetes Maternal Grandmother        IDDM   Diabetes Paternal Grandmother        IDDM   Heart disease Paternal Grandmother    Heart disease Paternal Grandfather        CAD - used nitroglycerin tablets   Colon cancer Neg Hx    Esophageal cancer Neg Hx    Stomach cancer Neg Hx    Stroke Neg Hx    Colon  polyps Neg Hx    Rectal cancer Neg Hx    Outpatient Medications Prior to Visit  Medication Sig Dispense Refill   Calcium 200 MG TABS Take by mouth. Tale [phone] mg daily     cetirizine (ZYRTEC) 10 MG tablet Take 10 mg by mouth daily.     Cholecalciferol (VITAMIN D) 2000 units CAPS daily.     estradiol (ESTRACE VAGINAL) 0.1 MG/GM vaginal cream Place 1 g vaginally 2 (two) times a week. 42.5 g 1   famotidine (PEPCID) 40 MG tablet TAKE ONE TABLET BY MOUTH DAILY AT BEDTIME 90 tablet 0   FERROCITE 324 MG TABS tablet TAKE 1 TABLET (106 MG OF IRON TOTAL) BY MOUTH DAILY. 90 tablet 1   glimepiride (AMARYL) 1 MG tablet TAKE 1 TABLET BY MOUTH TWO A DAY WITH A MEAL 180 tablet 1   Glucosamine-Chondroitin 1500-1200 MG/30ML LIQD daily.     glucose blood (ONETOUCH VERIO) test strip Use as instructed 300 strip 12   lamoTRIgine (LAMICTAL) 200 MG tablet Take 200 mg by mouth daily.     lidocaine (XYLOCAINE) 5 % ointment Apply 1 application. topically as needed. 30 g 0   Multiple Vitamin (MULTI VITAMIN) TABS daily.     omega-3 acid ethyl esters (LOVAZA) 1 g capsule Take by mouth daily.     OneTouch Delica Lancets 59D MISC Use to test blood sugar 3X daily.  Dx Code: E11.65 300 each 1   rosuvastatin (CRESTOR) 5 MG tablet TAKE 1 TABLET (5 MG TOTAL) BY MOUTH 2 (TWO) TIMES A WEEK. 24 tablet 1   UNABLE TO FIND Med Name: CBD     valACYclovir (VALTREX) 500 MG tablet Take 1 tablet (500 mg total) by mouth daily. 90 tablet 0   hydrOXYzine (ATARAX) 10 MG tablet Take 1 tablet (10 mg total) by mouth 3 (three) times daily as needed. 30 tablet 1   No facility-administered medications prior to visit.   Allergies  Allergen Reactions   Cortisone Palpitations, Other (See Comments) and Nausea And Vomiting    hyperactivity hyperactivity   Short Ragweed Pollen Ext Itching   Grass Pollen(K-O-R-T-Swt Vern) Rash   Other Other (See Comments), Hives, Itching and Nausea Only    Other reaction(s): Headache   Pseudoephedrine Other  (See Comments)    tachycardia tachycardia   Atorvastatin Other (See Comments)    Low dose   Codeine    Fluoxetine Hcl Other (See Comments)   Metformin Other (See Comments)  04/15/15 caused dizziness   Metformin And Related     04/15/15 caused dizziness   Shellfish Allergy Other (See Comments)    [other]    Objective:   Today's Vitals   04/09/22 0823  BP: 118/66  Pulse: (!) 59  Temp: 97.8 F (36.6 C)  TempSrc: Temporal  SpO2: 98%  Weight: 161 lb 3.2 oz (73.1 kg)  Height: '5\' 3"'$  (1.6 m)   Body mass index is 28.56 kg/m.   General: Well developed, well nourished. No acute distress. Psych: Alert and oriented. Normal mood and affect.  Health Maintenance Due  Topic Date Due   OPHTHALMOLOGY EXAM  03/14/2022   URINE MICROALBUMIN  03/20/2022   INFLUENZA VACCINE  04/07/2022     Assessment & Plan:   1. 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 We will reassess today. I will plan to continue her glimiperide. Due for annual micral. I will also reassess renal function, as she had a mild increase recently, apparently related to increased NSAID use. She has since stopped this.  - Microalbumin / creatinine urine ratio - Hemoglobin X1G - Basic metabolic panel - glimepiride (AMARYL) 1 MG tablet; Take 1 tablet (1 mg total) by mouth in the morning and at bedtime.  Dispense: 180 tablet; Refill: 3  2. Hyperlipidemia, unspecified hyperlipidemia type Stable. On rosuvastatin 5 mg daily and Lovaza 1 gm daily.  3. HSV-2 (herpes simplex virus 2) infection Resolving. Will keep Valtrex available for future outbreaks.  4. Chronic rhinitis I will try switching Ms. Stefanick to a nasal antihistamine to see if this will help with the rhinorrhea, without as many systemic side effects.  - azelastine (ASTELIN) 0.1 % nasal spray; Place 1 spray into both  nostrils 2 (two) times daily. Use in each nostril as directed  Dispense: 30 mL; Refill: 12  5. Anemia, unspecified type Request refill. I will plan to recheck her CBC  and iron at her next appointment. If doing well, she will then be able to stop this supplementation.  - Ferrous Fumarate (FERROCITE) 324 (106 Fe) MG TABS tablet; Take 1 tablet (106 mg of iron total) by mouth daily.  Dispense: 90 tablet; Refill: 1   Return in about 3 months (around 07/10/2022) for Reassessment.   Haydee Salter, MD

## 2022-04-10 ENCOUNTER — Encounter: Payer: Self-pay | Admitting: Family Medicine

## 2022-04-29 ENCOUNTER — Ambulatory Visit (INDEPENDENT_AMBULATORY_CARE_PROVIDER_SITE_OTHER): Payer: Medicare HMO | Admitting: Family Medicine

## 2022-04-29 ENCOUNTER — Encounter: Payer: Self-pay | Admitting: Family Medicine

## 2022-04-29 ENCOUNTER — Telehealth: Payer: Self-pay | Admitting: Family Medicine

## 2022-04-29 VITALS — BP 130/84 | HR 67 | Temp 98.1°F | Wt 158.6 lb

## 2022-04-29 DIAGNOSIS — R059 Cough, unspecified: Secondary | ICD-10-CM | POA: Diagnosis not present

## 2022-04-29 LAB — POCT INFLUENZA A/B
Influenza A, POC: NEGATIVE
Influenza B, POC: NEGATIVE

## 2022-04-29 LAB — POC COVID19 BINAXNOW: SARS Coronavirus 2 Ag: NEGATIVE

## 2022-04-29 MED ORDER — BENZONATATE 100 MG PO CAPS: 100.0000 mg | ORAL_CAPSULE | Freq: Two times a day (BID) | ORAL | 0 refills | Status: DC | PRN

## 2022-04-29 NOTE — Progress Notes (Signed)
Assessment/Plan:   Problem List Items Addressed This Visit       Other   Cough - Primary    Likely viral, cannot rule out bacterial cause COVID negative Influenza negative Recommend OTC supportive care e.g.  ibuprofen, tylenol Tessalon Perles Return precautions discussed Follow-up as needed       Relevant Medications   benzonatate (TESSALON) 100 MG capsule   Other Relevant Orders   POC COVID-19 (Completed)   POCT Influenza A/B (Completed)       Subjective:  HPI:  FRANCISCO EYERLY is a 71 y.o. female who has Hyperlipidemia; Recurrent major depressive disorder ([place]); History of colonic polyps; Osteopenia; Type 2 diabetes mellitus with retinopathy ([place]); Constipation; Decreased visual acuity; Atypical chest pain; BMI 29.0-29.9,adult; Dupuytren's contracture of both hands; Osteoarthritis of both hands; Dyspnea; Gastroesophageal reflux disease; Lumbar radiculopathy; Degenerative lumbar spinal stenosis; Carpal tunnel syndrome of left wrist; History of premalignant rectal tumor; Arthritis of hand; Posttraumatic stress disorder; HSV-2 (herpes simplex virus 2) infection; Chronic rhinitis; and Cough on their problem list..   She  has a past medical history of Allergy, Anal lesion (2007), Anemia, Anesthesia complication, Arthritis, Cancer ([place]), Cataract, Central retinal artery occlusion (06/08/2016), Cervical dysplasia (04/1991), Constipation (06/08/2016), Decreased visual acuity (06/08/2016), Depression, Diabetes mellitus without complication ([place]), Fatigue (06/27/2017), H/O measles, H/O mumps, History of chicken pox, Hyperlipidemia, Mitral valve prolapse, Osteoporosis, PONV (postoperative nausea and vomiting), Pre-diabetes, Preventative health care (12/21/2016), Squamous cell carcinoma in situ of skin, STD (sexually transmitted disease), and Vision impairment..   She presents with chief complaint of Cough (Cough, drainage, body aches, chest congestion, fever of 100 x 3 days) .  Upper  respiratory symptoms She complains of congestion, low grade fever, nasal congestion, post nasal drip, and productive cough with  clear colored sputum.with fever to 99-100 degrees Fahrenheit, chills. Onset of symptoms was a few days ago and gradually worsening.She is drinking plenty of fluids.  Past history is significant for no history of pneumonia or bronchitis. Patient is non-smoker  Patient does try Astelin for postnasal drip.  She has tried naproxen for fevers and chills.  This does help with fevers and chills. ---------------------------------------------------------------------------------------------------  Past Surgical History:  Procedure Laterality Date   CARPAL TUNNEL RELEASE Right 05/2020   CATARACT EXTRACTION, BILATERAL Bilateral    cataracts   CERVICAL CONE BIOPSY     COLONOSCOPY  2003   COLONOSCOPY  04/01/2011   [place] - repeat in 5 years   COLONOSCOPY W/ POLYPECTOMY  2007   Dr Earlean Shawl   EXCISION MORTON'S NEUROMA Right    LAMINECTOMY     1982   OOPHORECTOMY Left 12/2011   Dr Ubaldo Glassing ; ovarian cyst   POLYPECTOMY     RECTAL SURGERY  05/28/2006   pre-canerous lesion removed in 2007; Dr Morton Stall, [facility]   TUBAL LIGATION      Outpatient Medications Prior to Visit  Medication Sig Dispense Refill   azelastine (ASTELIN) 0.1 % nasal spray Place 1 spray into both nostrils 2 (two) times daily. Use in each nostril as directed 30 mL 12   Calcium 200 MG TABS Take by mouth. Tale [phone] mg daily     cetirizine (ZYRTEC) 10 MG tablet Take 10 mg by mouth daily.     Cholecalciferol (VITAMIN D) 2000 units CAPS daily.     estradiol (ESTRACE VAGINAL) 0.1 MG/GM vaginal cream Place 1 g vaginally 2 (two) times a week. 42.5 g 1   famotidine (PEPCID) 40 MG tablet TAKE ONE TABLET BY MOUTH DAILY  AT BEDTIME 90 tablet 0   Ferrous Fumarate (FERROCITE) 324 (106 Fe) MG TABS tablet Take 1 tablet (106 mg of iron total) by mouth daily. 90 tablet 1   glimepiride (AMARYL) 1 MG tablet Take 1 tablet (1 mg  total) by mouth in the morning and at bedtime. 180 tablet 3   Glucosamine-Chondroitin 1500-1200 MG/30ML LIQD daily.     glucose blood (ONETOUCH VERIO) test strip Use as instructed 300 strip 12   lamoTRIgine (LAMICTAL) 200 MG tablet Take 200 mg by mouth daily.     lidocaine (XYLOCAINE) 5 % ointment Apply 1 application. topically as needed. 30 g 0   Multiple Vitamin (MULTI VITAMIN) TABS daily.     omega-3 acid ethyl esters (LOVAZA) 1 g capsule Take by mouth daily.     OneTouch Delica Lancets 16W MISC Use to test blood sugar 3X daily.  Dx Code: E11.65 300 each 1   UNABLE TO FIND Med Name: CBD     valACYclovir (VALTREX) 500 MG tablet Take 1 tablet (500 mg total) by mouth daily. 90 tablet 0   rosuvastatin (CRESTOR) 5 MG tablet TAKE 1 TABLET (5 MG TOTAL) BY MOUTH 2 (TWO) TIMES A WEEK. (Patient not taking: Reported on 04/29/2022) 24 tablet 1   No facility-administered medications prior to visit.    Family History  Problem Relation Age of Onset   Diabetes Mother    Dementia Mother    Prostate cancer Father    COPD Father    Heart disease Father        CAD - used nitroglycerin tablets   Diabetes Sister        obese   Thyroid cancer Sister    Diabetes Brother        not obese   Lymphoma Brother        NHL   Cancer Brother        non Hodgkin's Lymphoma   Diabetes Brother    Diabetes Maternal Aunt        X5 ; all IDDM   Heart disease Maternal Aunt        "enlarged heart"   Heart disease Maternal Uncle        "enlarged heart"   Diabetes Maternal Grandmother        IDDM   Diabetes Paternal Grandmother        IDDM   Heart disease Paternal Grandmother    Heart disease Paternal Grandfather        CAD - used nitroglycerin tablets   Colon cancer Neg Hx    Esophageal cancer Neg Hx    Stomach cancer Neg Hx    Stroke Neg Hx    Colon polyps Neg Hx    Rectal cancer Neg Hx     Social History   Socioeconomic History   Marital status: Single    Spouse name: Not on file   Number of  children: 0   Years of education: Not on file   Highest education level: Not on file  Occupational History   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  Tobacco Use   Smoking status: Never   Smokeless tobacco: Never  Vaping Use   Vaping Use: Never used  Substance and Sexual Activity   Alcohol use: Yes    Comment: glass wine once a month   Drug use: No   Sexual activity: Yes    Partners: Male    Comment: 1st intercourse 71 yo-More than 5 partners  Other Topics Concern   Not on  file  Social History Narrative   Lives alone   No dietary restrictions   Social Determinants of Health   Financial Resource Strain: Low Risk  (02/18/2022)   Overall Financial Resource Strain (CARDIA)    Difficulty of Paying Living Expenses: Not hard at all  Food Insecurity: No Food Insecurity (02/18/2022)   Hunger Vital Sign    Worried About Running Out of Food in the Last Year: Never true    Ran Out of Food in the Last Year: Never true  Transportation Needs: No Transportation Needs (02/18/2022)   PRAPARE - Hydrologist (Medical): No    Lack of Transportation (Non-Medical): No  Physical Activity: Insufficiently Active (02/18/2022)   Exercise Vital Sign    Days of Exercise per Week: 3 days    Minutes of Exercise per Session: 30 min  Stress: No Stress Concern Present (02/18/2022)   [place]    Feeling of Stress : Not at all  Social Connections: Moderately Integrated (02/18/2022)   Social Connection and Isolation Panel [NHANES]    Frequency of Communication with Friends and Family: Three times a week    Frequency of Social Gatherings with Friends and Family: Three times a week    Attends Religious Services: More than 4 times per year    Active Member of Clubs or Organizations: Yes    Attends Archivist Meetings: More than 4 times per year    Marital Status: Divorced  Intimate Partner Violence: Not At  Risk (02/18/2022)   Humiliation, Afraid, Rape, and Kick questionnaire    Fear of Current or Ex-Partner: No    Emotionally Abused: No    Physically Abused: No    Sexually Abused: No                                                                                                 Objective:  Physical Exam: BP 130/84 (BP Location: Left Arm, Patient Position: Sitting, Cuff Size: Large)   Pulse 67   Temp 98.1 F (36.7 C) (Oral)   Wt 158 lb 9.6 oz (71.9 kg)   LMP 04/07/2001 (Approximate)   SpO2 97%   BMI 28.09 kg/m    General: No acute distress. Awake and conversant.  Eyes: Normal conjunctiva, anicteric. Round symmetric pupils.  ENT: Hearing grossly intact. No nasal discharge.  Neck: Neck is supple. No masses or thyromegaly.  Respiratory: Respirations are non-labored. No auditory wheezing.  CTA B Skin: Warm. No rashes or ulcers.  Psych: Alert and oriented. Cooperative, Appropriate mood and affect, Normal judgment.  CV: No cyanosis or JVD, RRR MSK: Normal ambulation. No clubbing  Neuro: Sensation and CN II-XII grossly normal.        Alesia Banda, MD, MS

## 2022-04-29 NOTE — Assessment & Plan Note (Signed)
Likely viral, cannot rule out bacterial cause COVID negative Influenza negative Recommend OTC supportive care e.g.  ibuprofen, tylenol Tessalon Perles Return precautions discussed Follow-up as needed

## 2022-04-30 ENCOUNTER — Ambulatory Visit: Payer: Medicare HMO | Admitting: Family Medicine

## 2022-04-30 NOTE — Telephone Encounter (Signed)
error 

## 2022-05-06 ENCOUNTER — Encounter: Payer: Self-pay | Admitting: Nurse Practitioner

## 2022-05-06 ENCOUNTER — Ambulatory Visit (INDEPENDENT_AMBULATORY_CARE_PROVIDER_SITE_OTHER): Payer: Medicare HMO | Admitting: Nurse Practitioner

## 2022-05-06 VITALS — BP 113/73 | HR 70 | Temp 97.8°F | Wt 157.2 lb

## 2022-05-06 DIAGNOSIS — J0110 Acute frontal sinusitis, unspecified: Secondary | ICD-10-CM

## 2022-05-06 MED ORDER — AMOXICILLIN-POT CLAVULANATE 875-125 MG PO TABS: 1.0000 | ORAL_TABLET | Freq: Two times a day (BID) | ORAL | 0 refills | Status: DC

## 2022-05-06 NOTE — Patient Instructions (Signed)
It was great to see you!  Drink plenty of fluids. Start augmentin antibiotic twice a day for 10 days with food. Keep doing your cough medicine and nasal sprays.   Let's follow-up if your symptoms worsen or don't improve   Take care,  Vance Peper, NP

## 2022-05-06 NOTE — Progress Notes (Signed)
Acute Office Visit  Subjective:     Patient ID: Destiny Romero, female    DOB: March 30, 1951, 71 y.o.   MRN: [id number]  Chief Complaint  Patient presents with   Cough    Pt c/o persistent productive cough w/ mucous, sore throat, post-nasal drip x1 wk, no body aches/chills/fever. Tested neg for COVID and flu 04/29/22     HPI Patient is in today for ongoing cough, nasal congestion, sinus pressure, and post nasal drip. She tested negative for covid-19 and the flu last week. She endorses a sore throat, but may be from the coughing. She states she had a fever at the beginning of her symptoms, however that has resolved. She has been taking tessalon, guaifenasin, astelin nasal spray, zyrtec, and saline nasal spray.   ROS See pertinent positives and negatives per HPI.    Objective:    BP 113/73   Pulse 70   Temp 97.8 F (36.6 C) (Temporal)   Wt 157 lb 3.2 oz (71.3 kg)   LMP 04/07/2001 (Approximate)   SpO2 96%   BMI 27.85 kg/m    Physical Exam Vitals and nursing note reviewed.  Constitutional:      General: She is not in acute distress.    Appearance: Normal appearance.  HENT:     Head: Normocephalic.     Right Ear: Tympanic membrane, ear canal and external ear normal.     Left Ear: Tympanic membrane, ear canal and external ear normal.     Nose:     Right Sinus: Frontal sinus tenderness present.     Left Sinus: Frontal sinus tenderness present.     Mouth/Throat:     Mouth: Mucous membranes are moist.     Pharynx: Posterior oropharyngeal erythema present.  Eyes:     Conjunctiva/sclera: Conjunctivae normal.  Cardiovascular:     Rate and Rhythm: Normal rate and regular rhythm.     Pulses: Normal pulses.     Heart sounds: Normal heart sounds.  Pulmonary:     Effort: Pulmonary effort is normal.     Breath sounds: Normal breath sounds.  Musculoskeletal:     Cervical back: Normal range of motion and neck supple. No tenderness.  Lymphadenopathy:     Cervical: No cervical  adenopathy.  Skin:    General: Skin is warm.  Neurological:     General: No focal deficit present.     Mental Status: She is alert and oriented to person, place, and time.  Psychiatric:        Mood and Affect: Mood normal.        Behavior: Behavior normal.        Thought Content: Thought content normal.        Judgment: Judgment normal.       Assessment & Plan:   Problem List Items Addressed This Visit   None Visit Diagnoses     Acute non-recurrent frontal sinusitis    -  Primary   Start augmentin BID x 10 days. Encourage fluids, rest. Continue astelin nasal spray, tessalon, guaifenisin, and nasal saline. F/U if not improving   Relevant Medications   amoxicillin-clavulanate (AUGMENTIN) 875-125 MG tablet       Meds ordered this encounter  Medications   amoxicillin-clavulanate (AUGMENTIN) 875-125 MG tablet    Sig: Take 1 tablet by mouth 2 (two) times daily.    Dispense:  20 tablet    Refill:  0    Return if symptoms worsen or fail to improve.  Ander Purpura  Avelina Laine, NP

## 2022-05-15 ENCOUNTER — Encounter: Payer: Self-pay | Admitting: Family Medicine

## 2022-05-28 DIAGNOSIS — H26492 Other secondary cataract, left eye: Secondary | ICD-10-CM | POA: Diagnosis not present

## 2022-05-28 DIAGNOSIS — H35371 Puckering of macula, right eye: Secondary | ICD-10-CM | POA: Insufficient documentation

## 2022-05-28 DIAGNOSIS — E119 Type 2 diabetes mellitus without complications: Secondary | ICD-10-CM | POA: Diagnosis not present

## 2022-05-28 LAB — HM DIABETES EYE EXAM

## 2022-05-29 ENCOUNTER — Encounter: Payer: Self-pay | Admitting: Family Medicine

## 2022-06-01 ENCOUNTER — Ambulatory Visit: Payer: Medicare HMO | Admitting: Family Medicine

## 2022-06-02 DIAGNOSIS — H35371 Puckering of macula, right eye: Secondary | ICD-10-CM | POA: Insufficient documentation

## 2022-06-02 DIAGNOSIS — H26492 Other secondary cataract, left eye: Secondary | ICD-10-CM | POA: Diagnosis not present

## 2022-06-04 ENCOUNTER — Ambulatory Visit (INDEPENDENT_AMBULATORY_CARE_PROVIDER_SITE_OTHER): Payer: Medicare HMO | Admitting: Family Medicine

## 2022-06-04 ENCOUNTER — Encounter: Payer: Self-pay | Admitting: Family Medicine

## 2022-06-04 VITALS — BP 130/68 | HR 59 | Temp 98.0°F | Wt 162.2 lb

## 2022-06-04 DIAGNOSIS — J310 Chronic rhinitis: Secondary | ICD-10-CM

## 2022-06-04 MED ORDER — MONTELUKAST SODIUM 10 MG PO TABS: 10.0000 mg | ORAL_TABLET | Freq: Every day | ORAL | 3 refills | Status: DC

## 2022-06-04 MED ORDER — PREDNISONE 20 MG PO TABS: 20.0000 mg | ORAL_TABLET | Freq: Every day | ORAL | 0 refills | Status: DC

## 2022-06-04 NOTE — Progress Notes (Signed)
[facility] [address] [place] [address] Dept: [phone] Dept Fax: [phone]  Office Visit  Subjective:    Patient ID: Destiny Romero, female    DOB: 03-01-1951, 71 y.o..   MRN: [id number]  Chief Complaint  Patient presents with   Acute Visit    Pt stated they cont to have post nasal drainage w/ some infection. Constipation. After colonoscopy.    History of Present Illness:  Patient is in today complaining of ongoing post-nasal drip and cough. Ms. Province has a history of chronic rhinitis and prior allergies to dust mites and animal dander. She uses Xyzal and Astelin spray for this. She also uses salt water spray to hydrate her nose. She was seen by Dr. Grandville Silos on 8/23 with a cough that appeared to be viral. He treated her symptomatically. She returned on 8/30 to see Ms. McElwee with symptoms suggestive of a sinusitis. She was treated with a course of Augmentin. She feels this may have helped marginally. However, she continues to note some PND and cough. She is worried that she may still have an infection, as the drainage is yellow. She has an upcoming vitrectomy and is worried about coughing during the procedure.  Past Medical History: Patient Active Problem List   Diagnosis Date Noted   Epiretinal membrane, right eye 06/02/2022   HSV-2 (herpes simplex virus 2) infection 04/09/2022   Chronic rhinitis 04/09/2022   Posttraumatic stress disorder 01/07/2022   Arthritis of hand 11/21/2021   History of premalignant rectal tumor 11/13/2021   Degenerative lumbar spinal stenosis 03/03/2021   Lumbar radiculopathy 01/22/2021   Osteoarthritis of both hands 11/10/2020   Carpal tunnel syndrome of left wrist 04/11/2020   Gastroesophageal reflux disease 11/27/2019   Atypical chest pain 11/07/2018   BMI 29.0-29.9,adult 11/07/2018   Dyspnea 11/07/2018   Dupuytren's contracture of both hands 05/30/2018   Constipation 06/08/2016    Decreased visual acuity 06/08/2016   Type 2 diabetes mellitus with retinopathy ([place]) 08/11/2013   Osteopenia 08/19/2012   Hyperlipidemia 06/27/2008   History of colonic polyps 06/27/2008   Recurrent major depressive disorder ([place]) 01/19/2007   Past Surgical History:  Procedure Laterality Date   CARPAL TUNNEL RELEASE Right 05/2020   CATARACT EXTRACTION, BILATERAL Bilateral    cataracts   CERVICAL CONE BIOPSY     COLONOSCOPY  2003   COLONOSCOPY  04/01/2011   [place] - repeat in 5 years   COLONOSCOPY W/ POLYPECTOMY  2007   Dr Earlean Shawl   EXCISION MORTON'S NEUROMA Right    LAMINECTOMY     1982   OOPHORECTOMY Left 12/2011   Dr Ubaldo Glassing ; ovarian cyst   POLYPECTOMY     RECTAL SURGERY  05/28/2006   pre-canerous lesion removed in 2007; Dr Morton Stall, [facility]   TUBAL LIGATION     Family History  Problem Relation Age of Onset   Diabetes Mother    Dementia Mother    Prostate cancer Father    COPD Father    Heart disease Father        CAD - used nitroglycerin tablets   Diabetes Sister        obese   Thyroid cancer Sister    Diabetes Brother        not obese   Lymphoma Brother        NHL   Cancer Brother        non Hodgkin's Lymphoma   Diabetes Brother    Diabetes Maternal Aunt  X5 ; all IDDM   Heart disease Maternal Aunt        "enlarged heart"   Heart disease Maternal Uncle        "enlarged heart"   Diabetes Maternal Grandmother        IDDM   Diabetes Paternal Grandmother        IDDM   Heart disease Paternal Grandmother    Heart disease Paternal Grandfather        CAD - used nitroglycerin tablets   Colon cancer Neg Hx    Esophageal cancer Neg Hx    Stomach cancer Neg Hx    Stroke Neg Hx    Colon polyps Neg Hx    Rectal cancer Neg Hx    Outpatient Medications Prior to Visit  Medication Sig Dispense Refill   azelastine (ASTELIN) 0.1 % nasal spray Place 1 spray into both nostrils 2 (two) times daily. Use in each nostril as directed 30 mL 12   Calcium 200 MG TABS Take  by mouth. Tale [phone] mg daily     Cholecalciferol (VITAMIN D) 2000 units CAPS daily.     famotidine (PEPCID) 40 MG tablet TAKE ONE TABLET BY MOUTH DAILY AT BEDTIME 90 tablet 0   Ferrous Fumarate (FERROCITE) 324 (106 Fe) MG TABS tablet Take 1 tablet (106 mg of iron total) by mouth daily. 90 tablet 1   glimepiride (AMARYL) 1 MG tablet Take 1 tablet (1 mg total) by mouth in the morning and at bedtime. 180 tablet 3   Glucosamine-Chondroitin 1500-1200 MG/30ML LIQD daily.     glucose blood (ONETOUCH VERIO) test strip Use as instructed 300 strip 12   lamoTRIgine (LAMICTAL) 200 MG tablet Take 200 mg by mouth daily.     lidocaine (XYLOCAINE) 5 % ointment Apply 1 application. topically as needed. 30 g 0   Multiple Vitamin (MULTI VITAMIN) TABS daily.     omega-3 acid ethyl esters (LOVAZA) 1 g capsule Take by mouth daily.     OneTouch Delica Lancets 63S MISC Use to test blood sugar 3X daily.  Dx Code: E11.65 300 each 1   UNABLE TO FIND Med Name: CBD     valACYclovir (VALTREX) 500 MG tablet Take 1 tablet (500 mg total) by mouth daily. 90 tablet 0   cetirizine (ZYRTEC) 10 MG tablet Take 10 mg by mouth daily.     amoxicillin-clavulanate (AUGMENTIN) 875-125 MG tablet Take 1 tablet by mouth 2 (two) times daily. (Patient not taking: Reported on 06/04/2022) 20 tablet 0   benzonatate (TESSALON) 100 MG capsule Take 1 capsule (100 mg total) by mouth 2 (two) times daily as needed for cough. (Patient not taking: Reported on 06/04/2022) 20 capsule 0   estradiol (ESTRACE VAGINAL) 0.1 MG/GM vaginal cream Place 1 g vaginally 2 (two) times a week. (Patient not taking: Reported on 06/04/2022) 42.5 g 1   rosuvastatin (CRESTOR) 5 MG tablet TAKE 1 TABLET (5 MG TOTAL) BY MOUTH 2 (TWO) TIMES A WEEK. (Patient not taking: Reported on 04/29/2022) 24 tablet 1   No facility-administered medications prior to visit.   Allergies  Allergen Reactions   Cortisone Palpitations, Other (See Comments) and Nausea And Vomiting     hyperactivity hyperactivity   Short Ragweed Pollen Ext Itching   Grass Pollen(K-O-R-T-Swt Vern) Rash   Other Other (See Comments), Hives, Itching and Nausea Only    Other reaction(s): Headache   Pseudoephedrine Other (See Comments)    tachycardia tachycardia   Atorvastatin Other (See Comments)    Low dose  Codeine    Fluoxetine Hcl Other (See Comments)   Metformin Other (See Comments)    04/15/15 caused dizziness   Metformin And Related     04/15/15 caused dizziness   Shellfish Allergy Other (See Comments)    [other]    Objective:   Today's Vitals   06/04/22 0912  BP: 130/68  Pulse: (!) 59  Temp: 98 F (36.7 C)  TempSrc: Temporal  SpO2: 96%  Weight: 162 lb 3.2 oz (73.6 kg)   Body mass index is 28.73 kg/m.   General: Well developed, well nourished. No acute distress. HEENT: Normocephalic, non-traumatic. PERRL, EOMI. Conjunctiva clear. External ears   normal. EAC and TMs normal bilaterally. Nasal mucosa is mildly swollen and pale, but   without significant rhinorrhea. There is no pain on percussion over the sinuses. Mucous   membranes moist. Oropharynx clear. Good dentition. Neck: Supple. No lymphadenopathy. No thyromegaly. Lungs: Clear to auscultation bilaterally. No wheezing, rales or rhonchi. Psych: Alert and oriented. Normal mood and affect.  Health Maintenance Due  Topic Date Due   COVID-19 Vaccine (3 - Pfizer series) 01/26/2020   INFLUENZA VACCINE  04/07/2022     Assessment & Plan:   1. Chronic rhinitis Ms. Ringel does recall that her symptoms started around the time she went to visit a friend in [place]. Her friend does have a dog. Her friend had also had been ill, so was not able to keep her house clean. Additionally, there are many corn fields around this area.She admits that her allergy issues may be at play. I recommend she continue her Xyzal and azelastine. I will have her take a 1-week course of prednisone to get on top of the allergic response. I will add  Singulair to her regular allergy regimen.  - montelukast (SINGULAIR) 10 MG tablet; Take 1 tablet (10 mg total) by mouth at bedtime.  Dispense: 30 tablet; Refill: 3 - predniSONE (DELTASONE) 20 MG tablet; Take 1 tablet (20 mg total) by mouth daily with breakfast.  Dispense: 7 tablet; Refill: 0   Return in about 6 weeks (around 07/16/2022) for Reassessment.   Haydee Salter, MD

## 2022-07-02 DIAGNOSIS — H35371 Puckering of macula, right eye: Secondary | ICD-10-CM | POA: Diagnosis not present

## 2022-07-02 DIAGNOSIS — E119 Type 2 diabetes mellitus without complications: Secondary | ICD-10-CM | POA: Diagnosis not present

## 2022-07-02 DIAGNOSIS — Z9889 Other specified postprocedural states: Secondary | ICD-10-CM | POA: Insufficient documentation

## 2022-07-02 DIAGNOSIS — I10 Essential (primary) hypertension: Secondary | ICD-10-CM | POA: Diagnosis not present

## 2022-07-10 ENCOUNTER — Ambulatory Visit: Payer: Medicare HMO | Admitting: Family Medicine

## 2022-07-13 DIAGNOSIS — D225 Melanocytic nevi of trunk: Secondary | ICD-10-CM | POA: Diagnosis not present

## 2022-07-13 DIAGNOSIS — L821 Other seborrheic keratosis: Secondary | ICD-10-CM | POA: Diagnosis not present

## 2022-07-13 DIAGNOSIS — Z85828 Personal history of other malignant neoplasm of skin: Secondary | ICD-10-CM | POA: Diagnosis not present

## 2022-07-13 DIAGNOSIS — D1801 Hemangioma of skin and subcutaneous tissue: Secondary | ICD-10-CM | POA: Diagnosis not present

## 2022-07-13 DIAGNOSIS — L820 Inflamed seborrheic keratosis: Secondary | ICD-10-CM | POA: Diagnosis not present

## 2022-07-13 DIAGNOSIS — L570 Actinic keratosis: Secondary | ICD-10-CM | POA: Diagnosis not present

## 2022-07-13 DIAGNOSIS — D2271 Melanocytic nevi of right lower limb, including hip: Secondary | ICD-10-CM | POA: Diagnosis not present

## 2022-07-13 DIAGNOSIS — D2261 Melanocytic nevi of right upper limb, including shoulder: Secondary | ICD-10-CM | POA: Diagnosis not present

## 2022-07-13 DIAGNOSIS — D2272 Melanocytic nevi of left lower limb, including hip: Secondary | ICD-10-CM | POA: Diagnosis not present

## 2022-07-13 DIAGNOSIS — D2371 Other benign neoplasm of skin of right lower limb, including hip: Secondary | ICD-10-CM | POA: Diagnosis not present

## 2022-07-16 ENCOUNTER — Encounter: Payer: Self-pay | Admitting: Family Medicine

## 2022-07-16 ENCOUNTER — Ambulatory Visit (INDEPENDENT_AMBULATORY_CARE_PROVIDER_SITE_OTHER): Payer: Medicare HMO | Admitting: Family Medicine

## 2022-07-16 VITALS — BP 128/76 | HR 64 | Temp 97.6°F | Ht 63.0 in | Wt 160.6 lb

## 2022-07-16 DIAGNOSIS — E11319 Type 2 diabetes mellitus with unspecified diabetic retinopathy without macular edema: Secondary | ICD-10-CM | POA: Diagnosis not present

## 2022-07-16 DIAGNOSIS — K5900 Constipation, unspecified: Secondary | ICD-10-CM

## 2022-07-16 DIAGNOSIS — D509 Iron deficiency anemia, unspecified: Secondary | ICD-10-CM

## 2022-07-16 DIAGNOSIS — J310 Chronic rhinitis: Secondary | ICD-10-CM | POA: Diagnosis not present

## 2022-07-16 LAB — CBC
HCT: 39.5 % (ref 36.0–46.0)
Hemoglobin: 13 g/dL (ref 12.0–15.0)
MCHC: 32.8 g/dL (ref 30.0–36.0)
MCV: 92.2 fl (ref 78.0–100.0)
Platelets: 274 10*3/uL (ref 150.0–400.0)
RBC: 4.28 Mil/uL (ref 3.87–5.11)
RDW: 13.9 % (ref 11.5–15.5)
WBC: 6.6 10*3/uL (ref 4.0–10.5)

## 2022-07-16 LAB — HEMOGLOBIN A1C: Hgb A1c MFr Bld: 7.7 % — ABNORMAL HIGH (ref 4.6–6.5)

## 2022-07-16 LAB — GLUCOSE, RANDOM: Glucose, Bld: 163 mg/dL — ABNORMAL HIGH (ref 70–99)

## 2022-07-16 NOTE — Patient Instructions (Signed)

## 2022-07-16 NOTE — Progress Notes (Signed)
[facility] [address] [place] [address] Dept: [phone] Dept Fax: [phone]  Chronic Care Office Visit  Subjective:    Patient ID: Destiny Romero, female    DOB: 1951-02-12, 71 y.o..   MRN: [id number]  Chief Complaint  Patient presents with   Follow-up    6 week f/u.  C/o having constipation.  Has been eating lots of green vegetables.     History of Present Illness:  Patient is in today for reassessment of chronic medical issues.  Destiny Romero saw me 5 weeks ago with a flare of her chronic rhinitis. This had been triggered by a trip to [place]. I add montelukast to her regimen and treated her with a course of prednisone. Destiny Romero feels she is doing much better at present. she does note some mild nosebleeds that she associates with her nasal spray.  Destiny Romero has a history of iron deficiency anemia. She had been on oral iron supplementation. She notes she has been struggling with worsening constipation, so stopped her iron pills. She is tryign to concentrate on dietary sources for iron.  Destiny Romero has a history of type 2 diabetes. She is currently managed on glimepiride 1 mg bid.   Past Medical History: Patient Active Problem List   Diagnosis Date Noted   Epiretinal membrane, right eye 06/02/2022   Macular pucker, right eye 05/28/2022   HSV-2 (herpes simplex virus 2) infection 04/09/2022   Chronic rhinitis 04/09/2022   Posttraumatic stress disorder 01/07/2022   Arthritis of hand 11/21/2021   History of premalignant rectal tumor 11/13/2021   Degenerative lumbar spinal stenosis 03/03/2021   Lumbar radiculopathy 01/22/2021   Osteoarthritis of both hands 11/10/2020   Carpal tunnel syndrome of left wrist 04/11/2020   Gastroesophageal reflux disease 11/27/2019   Atypical chest pain 11/07/2018   BMI 29.0-29.9,adult 11/07/2018   Dyspnea 11/07/2018   Dupuytren's contracture of both hands 05/30/2018    Constipation 06/08/2016   Decreased visual acuity 06/08/2016   Type 2 diabetes mellitus with retinopathy ([place]) 08/11/2013   Osteopenia 08/19/2012   Hyperlipidemia 06/27/2008   History of colonic polyps 06/27/2008   Recurrent major depressive disorder ([place]) 01/19/2007   Past Surgical History:  Procedure Laterality Date   CARPAL TUNNEL RELEASE Right 05/2020   CATARACT EXTRACTION, BILATERAL Bilateral    cataracts   CERVICAL CONE BIOPSY     COLONOSCOPY  2003   COLONOSCOPY  04/01/2011   [place] - repeat in 5 years   COLONOSCOPY W/ POLYPECTOMY  2007   Dr Earlean Shawl   EXCISION MORTON'S NEUROMA Right    EYE SURGERY     07/02/22, 05/31/22   LAMINECTOMY     1982   OOPHORECTOMY Left 12/2011   Dr Ubaldo Glassing ; ovarian cyst   POLYPECTOMY     RECTAL SURGERY  05/28/2006   pre-canerous lesion removed in 2007; Dr Morton Stall, [facility]   TUBAL LIGATION     Family History  Problem Relation Age of Onset   Diabetes Mother    Dementia Mother    Prostate cancer Father    COPD Father    Heart disease Father        CAD - used nitroglycerin tablets   Diabetes Sister        obese   Thyroid cancer Sister    Diabetes Brother        not obese   Lymphoma Brother        NHL   Cancer Brother  non Hodgkin's Lymphoma   Diabetes Brother    Diabetes Maternal Aunt        X5 ; all IDDM   Heart disease Maternal Aunt        "enlarged heart"   Heart disease Maternal Uncle        "enlarged heart"   Diabetes Maternal Grandmother        IDDM   Diabetes Paternal Grandmother        IDDM   Heart disease Paternal Grandmother    Heart disease Paternal Grandfather        CAD - used nitroglycerin tablets   Colon cancer Neg Hx    Esophageal cancer Neg Hx    Stomach cancer Neg Hx    Stroke Neg Hx    Colon polyps Neg Hx    Rectal cancer Neg Hx    Outpatient Medications Prior to Visit  Medication Sig Dispense Refill   azelastine (ASTELIN) 0.1 % nasal spray Place 1 spray into both nostrils 2 (two) times daily. Use in  each nostril as directed 30 mL 12   Calcium 200 MG TABS Take by mouth. Tale [phone] mg daily     cetirizine (ZYRTEC) 10 MG tablet Take by mouth.     Cholecalciferol (VITAMIN D) 2000 units CAPS daily.     ciprofloxacin (CILOXAN) 0.3 % ophthalmic solution Place 1 drop into the right eye 4 (four) times daily.     estradiol (ESTRACE) 0.1 MG/GM vaginal cream Place 1 g vaginally 2 (two) times a week.     famotidine (PEPCID) 40 MG tablet TAKE ONE TABLET BY MOUTH DAILY AT BEDTIME 90 tablet 0   glimepiride (AMARYL) 1 MG tablet Take 1 tablet (1 mg total) by mouth in the morning and at bedtime. 180 tablet 3   Glucosamine-Chondroitin 1500-1200 MG/30ML LIQD daily.     glucose blood (ONETOUCH VERIO) test strip Use as instructed 300 strip 12   lamoTRIgine (LAMICTAL) 200 MG tablet Take 200 mg by mouth daily.     lidocaine (XYLOCAINE) 5 % ointment Apply 1 application. topically as needed. 30 g 0   melatonin 3 MG TABS tablet 1 tablet at bedtime as needed Orally Once a day for 30 day(s)     montelukast (SINGULAIR) 10 MG tablet Take 1 tablet (10 mg total) by mouth at bedtime. 30 tablet 3   Multiple Vitamin (MULTI VITAMIN) TABS daily.     naproxen (NAPROSYN) 500 MG tablet Take by mouth.     omega-3 acid ethyl esters (LOVAZA) 1 g capsule Take by mouth daily.     OneTouch Delica Lancets 29U MISC Use to test blood sugar 3X daily.  Dx Code: E11.65 300 each 1   UNABLE TO FIND Med Name: CBD     valACYclovir (VALTREX) 500 MG tablet Take 1 tablet (500 mg total) by mouth daily. 90 tablet 0   Ferrous Fumarate (FERROCITE) 324 (106 Fe) MG TABS tablet Take 1 tablet (106 mg of iron total) by mouth daily. (Patient not taking: Reported on 07/16/2022) 90 tablet 1   predniSONE (DELTASONE) 20 MG tablet Take 1 tablet (20 mg total) by mouth daily with breakfast. 7 tablet 0   No facility-administered medications prior to visit.   Allergies  Allergen Reactions   Cortisone Palpitations, Other (See Comments) and Nausea And Vomiting     hyperactivity hyperactivity   Short Ragweed Pollen Ext Itching   Grass Pollen(K-O-R-T-Swt Vern) Rash   Other Other (See Comments), Hives, Itching and Nausea Only  Other reaction(s): Headache   Pseudoephedrine Other (See Comments)    tachycardia tachycardia   Atorvastatin Other (See Comments)    Low dose   Codeine    Fluoxetine Hcl Other (See Comments)   Metformin Other (See Comments)    04/15/15 caused dizziness   Metformin And Related     04/15/15 caused dizziness   Shellfish Allergy Other (See Comments)    [other]     Objective:   Today's Vitals   07/16/22 0839  BP: 128/76  Pulse: 64  Temp: 97.6 F (36.4 C)  TempSrc: Temporal  SpO2: 94%  Weight: 160 lb 9.6 oz (72.8 kg)  Height: '5\' 3"'$  (1.6 m)   Body mass index is 28.45 kg/m.   General: Well developed, well nourished. No acute distress. Psych: Alert and oriented. Normal mood and affect.  There are no preventive care reminders to display for this patient.  Lab Results Last CBC Lab Results  Component Value Date   WBC 7.9 11/19/2021   HGB 11.9 (L) 11/19/2021   HCT 34.8 (L) 11/19/2021   MCV 92.0 11/19/2021   MCH 30.0 05/20/2020   RDW 12.9 11/19/2021   PLT 241.0 11/19/2021   Iron/TIBC/Ferritin/ %Sat    Component Value Date/Time   IRON 79 11/19/2021 0811   TIBC 271 11/19/2021 0811   FERRITIN 257 11/19/2021 0811   IRONPCTSAT 29 11/19/2021 0811     Assessment & Plan:   1. Chronic rhinitis Improved. She should continue her daily antihistamine and montelukast 10 mg daily. We discussed how to use her nasal spray to limit how much bleeding she might get.  2. Iron deficiency anemia, unspecified iron deficiency anemia type Ms. Lizardo has had prior anemia and iron deficiency. I will recheck her iron studies and CBC today to assess.  - CBC - Iron, TIBC and Ferritin Panel  3. Constipation, unspecified constipation type Though her iron therapy may have been worsening her constipation, she has had pre-existing  constipation issues. We discussed use of fiber and Miralax to help manage this.  4. Type 2 diabetes mellitus with right eye affected by retinopathy without macular edema, without long-term current use of insulin, unspecified retinopathy severity ([place]) Due for A1c today. Continue glimepiride 1 mg bid.  - Glucose, random - Hemoglobin A1c   Return in about 3 months (around 10/16/2022) for Reassessment.   Haydee Salter, MD

## 2022-07-17 LAB — IRON,TIBC AND FERRITIN PANEL
%SAT: 30 % (calc) (ref 16–45)
Ferritin: 262 ng/mL (ref 16–288)
Iron: 94 ug/dL (ref 45–160)
TIBC: 316 mcg/dL (calc) (ref 250–450)

## 2022-07-28 DIAGNOSIS — E119 Type 2 diabetes mellitus without complications: Secondary | ICD-10-CM | POA: Diagnosis not present

## 2022-07-28 DIAGNOSIS — M8580 Other specified disorders of bone density and structure, unspecified site: Secondary | ICD-10-CM | POA: Diagnosis not present

## 2022-07-28 DIAGNOSIS — M5416 Radiculopathy, lumbar region: Secondary | ICD-10-CM | POA: Diagnosis not present

## 2022-07-29 DIAGNOSIS — M48061 Spinal stenosis, lumbar region without neurogenic claudication: Secondary | ICD-10-CM | POA: Insufficient documentation

## 2022-08-04 DIAGNOSIS — M48062 Spinal stenosis, lumbar region with neurogenic claudication: Secondary | ICD-10-CM | POA: Diagnosis not present

## 2022-08-07 DIAGNOSIS — E119 Type 2 diabetes mellitus without complications: Secondary | ICD-10-CM | POA: Diagnosis not present

## 2022-08-07 DIAGNOSIS — Z961 Presence of intraocular lens: Secondary | ICD-10-CM | POA: Diagnosis not present

## 2022-08-07 DIAGNOSIS — H35371 Puckering of macula, right eye: Secondary | ICD-10-CM | POA: Diagnosis not present

## 2022-08-07 DIAGNOSIS — Z9889 Other specified postprocedural states: Secondary | ICD-10-CM | POA: Diagnosis not present

## 2022-08-11 DIAGNOSIS — Z1231 Encounter for screening mammogram for malignant neoplasm of breast: Secondary | ICD-10-CM | POA: Diagnosis not present

## 2022-08-11 LAB — HM MAMMOGRAPHY

## 2022-08-13 ENCOUNTER — Encounter: Payer: Self-pay | Admitting: Family Medicine

## 2022-08-14 ENCOUNTER — Encounter: Payer: Self-pay | Admitting: Nurse Practitioner

## 2022-08-17 DIAGNOSIS — R69 Illness, unspecified: Secondary | ICD-10-CM | POA: Diagnosis not present

## 2022-08-17 DIAGNOSIS — F4310 Post-traumatic stress disorder, unspecified: Secondary | ICD-10-CM | POA: Diagnosis not present

## 2022-08-17 DIAGNOSIS — F339 Major depressive disorder, recurrent, unspecified: Secondary | ICD-10-CM | POA: Diagnosis not present

## 2022-08-19 ENCOUNTER — Encounter: Payer: Self-pay | Admitting: Family Medicine

## 2022-08-19 ENCOUNTER — Other Ambulatory Visit: Payer: Self-pay | Admitting: Family Medicine

## 2022-08-19 MED ORDER — FAMOTIDINE 40 MG PO TABS: ORAL_TABLET | ORAL | 0 refills | Status: DC

## 2022-08-30 ENCOUNTER — Other Ambulatory Visit: Payer: Self-pay | Admitting: Family Medicine

## 2022-08-30 DIAGNOSIS — J310 Chronic rhinitis: Secondary | ICD-10-CM

## 2022-09-28 ENCOUNTER — Ambulatory Visit (INDEPENDENT_AMBULATORY_CARE_PROVIDER_SITE_OTHER): Payer: Medicare HMO | Admitting: Family Medicine

## 2022-09-28 ENCOUNTER — Encounter: Payer: Self-pay | Admitting: Family Medicine

## 2022-09-28 VITALS — BP 172/84 | HR 64 | Temp 98.2°F | Ht 63.0 in | Wt 170.0 lb

## 2022-09-28 DIAGNOSIS — R69 Illness, unspecified: Secondary | ICD-10-CM | POA: Diagnosis not present

## 2022-09-28 DIAGNOSIS — J310 Chronic rhinitis: Secondary | ICD-10-CM | POA: Diagnosis not present

## 2022-09-28 DIAGNOSIS — F331 Major depressive disorder, recurrent, moderate: Secondary | ICD-10-CM | POA: Diagnosis not present

## 2022-09-28 DIAGNOSIS — E11319 Type 2 diabetes mellitus with unspecified diabetic retinopathy without macular edema: Secondary | ICD-10-CM | POA: Diagnosis not present

## 2022-09-28 MED ORDER — PIOGLITAZONE HCL 15 MG PO TABS: 15.0000 mg | ORAL_TABLET | Freq: Every day | ORAL | 5 refills | Status: DC

## 2022-09-28 MED ORDER — BUPROPION HCL ER (SR) 150 MG PO TB12: ORAL_TABLET | ORAL | 0 refills | Status: DC

## 2022-09-28 NOTE — Progress Notes (Signed)
[facility] [address] [place] [address] Dept: [phone] Dept Fax: [phone]  Office Visit  Subjective:    Patient ID: Destiny Romero, female    DOB: 1951/01/11, 72 y.o..   MRN: [id number]  Chief Complaint  Patient presents with   Acute Visit    Nostrils raw and bleeding, blood sugar elevated more than normal, stress    History of Present Illness:  Patient is in today complaining of significant stress and worsening depression. 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 The foot and back have impacted her ability to exercise, so she is concerned about ongoing weight gain over time. She finds herself being frequently tearful. She notes she self-isolates at home, having stretches where she does not get out of her pajamas for several days. She also has a sense of hopelessness. She sees Dr. Barbie Banner (psychiatry) related to her depression. He is managing her on lamotrigine. He has advised her to continue with her light therapy and her current meds. She admits that she has not discussed with him about her current degree of symptoms.  [name] has a history of Type 2 diabetes. She is managed on glimepiride. She has noted blood sugars recently in the 150s. She feels her lack of ability to exercise is impacting this.  [name] has a history of chronic rhinitis. She has had some recent nosebleeds. She feels that she may be allergic to dust mites. She would consider allergy testing and possibly going back on immunotherapy, but is not prepared to be off of her antihistamines for several days at this point.  Past Medical History: Patient Active Problem List   Diagnosis Date Noted   Spinal stenosis of lumbar region 07/29/2022   Epiretinal membrane, right eye 06/02/2022   Macular pucker, right  eye 05/28/2022   HSV-2 (herpes simplex virus 2) infection 04/09/2022   Chronic rhinitis 04/09/2022   Posttraumatic stress disorder 01/07/2022   Arthritis of hand 11/21/2021   History of premalignant rectal tumor 11/13/2021   Degenerative lumbar spinal stenosis 03/03/2021   Lumbar radiculopathy 01/22/2021   Osteoarthritis of both hands 11/10/2020   Carpal tunnel syndrome of left wrist 04/11/2020   Gastroesophageal reflux disease 11/27/2019   Atypical chest pain 11/07/2018   BMI 29.0-29.9,adult 11/07/2018   Dyspnea 11/07/2018   Dupuytren's contracture of both hands 05/30/2018   Constipation 06/08/2016   Decreased visual acuity 06/08/2016   Type 2 diabetes mellitus with retinopathy ([place]) 08/11/2013   Osteopenia 08/19/2012   Hyperlipidemia 06/27/2008   History of colonic polyps 06/27/2008   Recurrent major depressive disorder ([place]) 01/19/2007   Past Surgical History:  Procedure Laterality Date   CARPAL TUNNEL RELEASE Right 05/2020   CATARACT EXTRACTION, BILATERAL Bilateral    cataracts   CERVICAL CONE BIOPSY     COLONOSCOPY  2003   COLONOSCOPY  04/01/2011   [place] - repeat in 5 years   COLONOSCOPY W/ POLYPECTOMY  2007   Dr Earlean Shawl   EXCISION MORTON'S NEUROMA Right    EYE SURGERY     07/02/22, 05/31/22   LAMINECTOMY     1982   OOPHORECTOMY Left 12/2011   Dr Ubaldo Glassing ; ovarian cyst   POLYPECTOMY     RECTAL SURGERY  05/28/2006   pre-canerous lesion removed in 2007; Dr Morton Stall, [facility]   TUBAL LIGATION  Family History  Problem Relation Age of Onset   Diabetes Mother    Dementia Mother    Prostate cancer Father    COPD Father    Heart disease Father        CAD - used nitroglycerin tablets   Diabetes Sister        obese   Thyroid cancer Sister    Diabetes Brother        not obese   Lymphoma Brother        NHL   Cancer Brother        non Hodgkin's Lymphoma   Diabetes Brother    Diabetes Maternal Aunt        X5 ; all IDDM   Heart disease Maternal Aunt        "enlarged  heart"   Heart disease Maternal Uncle        "enlarged heart"   Diabetes Maternal Grandmother        IDDM   Diabetes Paternal Grandmother        IDDM   Heart disease Paternal Grandmother    Heart disease Paternal Grandfather        CAD - used nitroglycerin tablets   Colon cancer Neg Hx    Esophageal cancer Neg Hx    Stomach cancer Neg Hx    Stroke Neg Hx    Colon polyps Neg Hx    Rectal cancer Neg Hx    Outpatient Medications Prior to Visit  Medication Sig Dispense Refill   Calcium 200 MG TABS Take by mouth. Tale [phone] mg daily     cetirizine (ZYRTEC) 10 MG tablet Take by mouth.     Cholecalciferol (VITAMIN D) 2000 units CAPS daily.     estradiol (ESTRACE) 0.1 MG/GM vaginal cream Place 1 g vaginally 2 (two) times a week.     famotidine (PEPCID) 40 MG tablet TAKE ONE TABLET BY MOUTH DAILY AT BEDTIME 90 tablet 0   glimepiride (AMARYL) 1 MG tablet Take 1 tablet (1 mg total) by mouth in the morning and at bedtime. 180 tablet 3   glucose blood (ONETOUCH VERIO) test strip Use as instructed 300 strip 12   lamoTRIgine (LAMICTAL) 200 MG tablet Take 200 mg by mouth daily.     lidocaine (XYLOCAINE) 5 % ointment Apply 1 application. topically as needed. 30 g 0   Multiple Vitamin (MULTI VITAMIN) TABS daily.     naproxen (NAPROSYN) 500 MG tablet Take by mouth.     omega-3 acid ethyl esters (LOVAZA) 1 g capsule Take by mouth daily.     OneTouch Delica Lancets 30D MISC Use to test blood sugar 3X daily.  Dx Code: E11.65 300 each 1   UNABLE TO FIND Med Name: CBD     Glucosamine-Chondroitin 1500-1200 MG/30ML LIQD daily.     azelastine (ASTELIN) 0.1 % nasal spray Place 1 spray into both nostrils 2 (two) times daily. Use in each nostril as directed 30 mL 12   ciprofloxacin (CILOXAN) 0.3 % ophthalmic solution Place 1 drop into the right eye 4 (four) times daily.     melatonin 3 MG TABS tablet 1 tablet at bedtime as needed Orally Once a day for 30 day(s)     montelukast (SINGULAIR) 10 MG tablet TAKE  1 TABLET BY MOUTH EVERYDAY AT BEDTIME 90 tablet 3   valACYclovir (VALTREX) 500 MG tablet Take 1 tablet (500 mg total) by mouth daily. (Patient not taking: Reported on 09/28/2022) 90 tablet 0   No facility-administered medications  prior to visit.   Allergies  Allergen Reactions   Cortisone Palpitations, Other (See Comments) and Nausea And Vomiting    hyperactivity hyperactivity   Short Ragweed Pollen Ext Itching   Grass Pollen(K-O-R-T-Swt Vern) Rash   Other Other (See Comments), Hives, Itching and Nausea Only    Other reaction(s): Headache   Pseudoephedrine Other (See Comments)    tachycardia tachycardia   Atorvastatin Other (See Comments)    Low dose   Codeine    Fluoxetine Hcl Other (See Comments)   Metformin Other (See Comments)    04/15/15 caused dizziness   Metformin And Related     04/15/15 caused dizziness   Shellfish Allergy Other (See Comments)    [other]      Objective:   Today's Vitals   09/28/22 1329  BP: (!) 140/86  Pulse: 64  Temp: 98.2 F (36.8 C)  TempSrc: Tympanic  SpO2: 97%  Weight: 170 lb (77.1 kg)  Height: '5\' 3"'$  (1.6 m)   Body mass index is 30.11 kg/m.   General: Well developed, well nourished. No acute distress. Psych: Alert and oriented. Mildly depressed mood and flat affect.  Health Maintenance Due  Topic Date Due   FOOT EXAM  08/15/2022     Lab Results: Lab Results  Component Value Date   HGBA1C 7.7 (H) 07/16/2022    Assessment & Plan:   1. Moderate episode of recurrent major depressive disorder [facility]) Discussed management options. She had been on Zoloft int he past , but had issues with weight gain. She notes she was on some other medication that was too stimulating for her. I recommend she continue her lamotrigine 200 mg daily. I will write a prescription for bupropion. I asked her to check with Dr. Barbie Banner to make sure he was okay with her starting this.  - buPROPion (WELLBUTRIN SR) 150 MG 12 hr tablet; Take 1 tablet (150 mg total) by  mouth daily for 7 days, THEN 1 tablet (150 mg total) 2 (two) times daily  Dispense: 21 tablet; Refill: 0  2. Type 2 diabetes mellitus with right eye affected by retinopathy without macular edema, without long-term current use of insulin, unspecified retinopathy severity ([place]) Ms. Wenzlick's A1c is no longer at goal. I will try adding pioglitazone to her current regimen.  - pioglitazone (ACTOS) 15 MG tablet; Take 1 tablet (15 mg total) by mouth daily.  Dispense: 30 tablet; Refill: 5  3. Chronic rhinitis I offered referral to an allergist, but she declined for the current time. She is using an in-room air filter. She should continue her current regimen.   Return in about 6 weeks (around 11/09/2022) for Reassessment.   Haydee Salter, MD

## 2022-10-02 DIAGNOSIS — M48061 Spinal stenosis, lumbar region without neurogenic claudication: Secondary | ICD-10-CM | POA: Diagnosis not present

## 2022-10-02 DIAGNOSIS — M5416 Radiculopathy, lumbar region: Secondary | ICD-10-CM | POA: Diagnosis not present

## 2022-10-02 DIAGNOSIS — M8580 Other specified disorders of bone density and structure, unspecified site: Secondary | ICD-10-CM | POA: Diagnosis not present

## 2022-10-02 DIAGNOSIS — E119 Type 2 diabetes mellitus without complications: Secondary | ICD-10-CM | POA: Diagnosis not present

## 2022-10-05 DIAGNOSIS — R69 Illness, unspecified: Secondary | ICD-10-CM | POA: Diagnosis not present

## 2022-10-05 DIAGNOSIS — F339 Major depressive disorder, recurrent, unspecified: Secondary | ICD-10-CM | POA: Diagnosis not present

## 2022-10-05 DIAGNOSIS — F4310 Post-traumatic stress disorder, unspecified: Secondary | ICD-10-CM | POA: Diagnosis not present

## 2022-10-06 DIAGNOSIS — H35371 Puckering of macula, right eye: Secondary | ICD-10-CM | POA: Diagnosis not present

## 2022-10-06 DIAGNOSIS — E119 Type 2 diabetes mellitus without complications: Secondary | ICD-10-CM | POA: Diagnosis not present

## 2022-10-13 DIAGNOSIS — M5416 Radiculopathy, lumbar region: Secondary | ICD-10-CM | POA: Diagnosis not present

## 2022-10-13 DIAGNOSIS — Z6830 Body mass index (BMI) 30.0-30.9, adult: Secondary | ICD-10-CM | POA: Diagnosis not present

## 2022-10-16 ENCOUNTER — Ambulatory Visit: Payer: Medicare HMO | Admitting: Family Medicine

## 2022-10-22 DIAGNOSIS — M5416 Radiculopathy, lumbar region: Secondary | ICD-10-CM | POA: Diagnosis not present

## 2022-10-22 DIAGNOSIS — M5126 Other intervertebral disc displacement, lumbar region: Secondary | ICD-10-CM | POA: Diagnosis not present

## 2022-10-29 DIAGNOSIS — M5416 Radiculopathy, lumbar region: Secondary | ICD-10-CM | POA: Diagnosis not present

## 2022-11-04 ENCOUNTER — Ambulatory Visit: Payer: Medicare HMO | Admitting: Nurse Practitioner

## 2022-11-06 HISTORY — PX: LUMBAR LAMINECTOMY: SHX95

## 2022-11-09 ENCOUNTER — Encounter: Payer: Self-pay | Admitting: Family Medicine

## 2022-11-09 ENCOUNTER — Ambulatory Visit (INDEPENDENT_AMBULATORY_CARE_PROVIDER_SITE_OTHER): Payer: Medicare HMO | Admitting: Family Medicine

## 2022-11-09 VITALS — BP 122/68 | HR 55 | Temp 98.1°F | Ht 63.0 in | Wt 174.8 lb

## 2022-11-09 DIAGNOSIS — M48061 Spinal stenosis, lumbar region without neurogenic claudication: Secondary | ICD-10-CM | POA: Diagnosis not present

## 2022-11-09 DIAGNOSIS — R69 Illness, unspecified: Secondary | ICD-10-CM | POA: Diagnosis not present

## 2022-11-09 DIAGNOSIS — K219 Gastro-esophageal reflux disease without esophagitis: Secondary | ICD-10-CM

## 2022-11-09 DIAGNOSIS — E11319 Type 2 diabetes mellitus with unspecified diabetic retinopathy without macular edema: Secondary | ICD-10-CM | POA: Diagnosis not present

## 2022-11-09 DIAGNOSIS — N3000 Acute cystitis without hematuria: Secondary | ICD-10-CM

## 2022-11-09 DIAGNOSIS — F331 Major depressive disorder, recurrent, moderate: Secondary | ICD-10-CM

## 2022-11-09 LAB — POCT URINALYSIS DIPSTICK
Blood, UA: 10
Glucose, UA: NEGATIVE
Ketones, UA: NEGATIVE
Nitrite, UA: POSITIVE
Protein, UA: NEGATIVE
Spec Grav, UA: 1.015 (ref 1.010–1.025)
Urobilinogen, UA: 1 E.U./dL
pH, UA: 6 (ref 5.0–8.0)

## 2022-11-09 MED ORDER — GLIMEPIRIDE 1 MG PO TABS: 1.0000 mg | ORAL_TABLET | Freq: Two times a day (BID) | ORAL | 3 refills | Status: DC

## 2022-11-09 MED ORDER — FAMOTIDINE 40 MG PO TABS: ORAL_TABLET | ORAL | 0 refills | Status: DC

## 2022-11-09 MED ORDER — SULFAMETHOXAZOLE-TRIMETHOPRIM 800-160 MG PO TABS: 1.0000 | ORAL_TABLET | Freq: Two times a day (BID) | ORAL | 0 refills | Status: DC

## 2022-11-09 NOTE — Assessment & Plan Note (Signed)
Recently referred to neurosurgery.

## 2022-11-09 NOTE — Progress Notes (Signed)
[facility] [address] [place] [address] Dept: [phone] Dept Fax: [phone]  Chronic Care Office Visit  Subjective:    Patient ID: Destiny Romero, female    DOB: May 24, 1951, 72 y.o..   MRN: [id number]  Chief Complaint  Patient presents with   Medical Management of Chronic Issues    6 week f/u depression. C/o having dry sinuses and scab inside nose.    History of Present Illness:  Patient is in today for reassessment of chronic medical issues.  Destiny Romero is in today to follow-up regarding her depression. At her last visit, she had shared how she was under significant stress and this was worsening her depression. Her stress related to having had two recent eye surgeries (which went well, but still were disruptive), serving on her [employer] [place] board, and ongoing left foot and lower back pain issues. She found herself being frequently tearful. She noted she self-isolated at home, having stretches where she was not getting out of her pajamas for several days. She also had a sense of hopelessness. She sees Dr. Barbie Banner (psychiatry) related to her depression. He is managing her on lamotrigine. He had advised her to continue with her light therapy and her current meds. I had tried adding bupropion, but she notes Dr. Barbie Banner had other recommendations. She now has a UV light she is using at her desk. He tried her on pramipexole, but she did not tolerate this (nightmares). 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 She feels her mood is doing better at this point.  Destiny Romero does note an issue with burning when she urinates in the past 24 hours.  Destiny Romero has seen her orthopedist about her back issues. She is being referred to neurosurgery regarding degenerative lumbar spinal stenosis.   Past Medical History: Patient Active Problem List   Diagnosis Date Noted   Epiretinal membrane, right  eye 06/02/2022   Macular pucker, right eye 05/28/2022   HSV-2 (herpes simplex virus 2) infection 04/09/2022   Chronic rhinitis 04/09/2022   Posttraumatic stress disorder 01/07/2022   Arthritis of hand 11/21/2021   History of premalignant rectal tumor 11/13/2021   Degenerative lumbar spinal stenosis 03/03/2021   Lumbar radiculopathy 01/22/2021   Osteoarthritis of both hands 11/10/2020   Carpal tunnel syndrome of left wrist 04/11/2020   Gastroesophageal reflux disease 11/27/2019   Atypical chest pain 11/07/2018   BMI 29.0-29.9,adult 11/07/2018   Dyspnea 11/07/2018   Dupuytren's contracture of both hands 05/30/2018   Constipation 06/08/2016   Decreased visual acuity 06/08/2016   Type 2 diabetes mellitus with retinopathy ([place]) 08/11/2013   Osteopenia 08/19/2012   Hyperlipidemia 06/27/2008   History of colonic polyps 06/27/2008   Recurrent major depressive disorder ([place]) 01/19/2007   Past Surgical History:  Procedure Laterality Date   CARPAL TUNNEL RELEASE Right 05/2020   CATARACT EXTRACTION, BILATERAL Bilateral    cataracts   CERVICAL CONE BIOPSY     COLONOSCOPY  2003   COLONOSCOPY  04/01/2011   [place] - repeat in 5 years   COLONOSCOPY W/ POLYPECTOMY  2007   Dr Earlean Shawl   EXCISION MORTON'S NEUROMA Right    EYE SURGERY     07/02/22, 05/31/22   LAMINECTOMY     1982   OOPHORECTOMY Left 12/2011   Dr Ubaldo Glassing ; ovarian cyst   POLYPECTOMY     RECTAL SURGERY  05/28/2006   pre-canerous lesion removed in  2007; Dr Morton Stall, [place]   TUBAL LIGATION     Family History  Problem Relation Age of Onset   Diabetes Mother    Dementia Mother    Prostate cancer Father    COPD Father    Heart disease Father        CAD - used nitroglycerin tablets   Diabetes Sister        obese   Thyroid cancer Sister    Cancer Brother        non Hodgkin's Lymphoma, leukemia   Diabetes Brother        not obese   Kidney disease Brother    Diabetes Brother    Kidney failure Brother    Diabetes Maternal  Aunt        X5 ; all IDDM   Heart disease Maternal Aunt        "enlarged heart"   Heart disease Maternal Uncle        "enlarged heart"   Diabetes Maternal Grandmother        IDDM   Diabetes Paternal Grandmother        IDDM   Heart disease Paternal Grandmother    Heart disease Paternal Grandfather        CAD - used nitroglycerin tablets   Colon cancer Neg Hx    Esophageal cancer Neg Hx    Stomach cancer Neg Hx    Stroke Neg Hx    Colon polyps Neg Hx    Rectal cancer Neg Hx    Outpatient Medications Prior to Visit  Medication Sig Dispense Refill   Calcium 200 MG TABS Take by mouth. Tale [phone] mg daily     cetirizine (ZYRTEC) 10 MG tablet Take by mouth.     Cholecalciferol (VITAMIN D) 2000 units CAPS daily.     estradiol (ESTRACE) 0.1 MG/GM vaginal cream Place 1 g vaginally 2 (two) times a week.     famotidine (PEPCID) 40 MG tablet TAKE ONE TABLET BY MOUTH DAILY AT BEDTIME 90 tablet 0   glimepiride (AMARYL) 1 MG tablet Take 1 tablet (1 mg total) by mouth in the morning and at bedtime. 180 tablet 3   Glucosamine-Chondroitin 1500-1200 MG/30ML LIQD daily.     glucose blood (ONETOUCH VERIO) test strip Use as instructed 300 strip 12   lamoTRIgine (LAMICTAL) 200 MG tablet Take 200 mg by mouth daily.     lidocaine (XYLOCAINE) 5 % ointment Apply 1 application. topically as needed. 30 g 0   Multiple Vitamin (MULTI VITAMIN) TABS daily.     naproxen (NAPROSYN) 500 MG tablet Take by mouth.     omega-3 acid ethyl esters (LOVAZA) 1 g capsule Take by mouth daily.     OneTouch Delica Lancets 99991111 MISC Use to test blood sugar 3X daily.  Dx Code: E11.65 300 each 1   pioglitazone (ACTOS) 15 MG tablet Take 1 tablet (15 mg total) by mouth daily. 30 tablet 5   UNABLE TO FIND Med Name: CBD     buPROPion (WELLBUTRIN SR) 150 MG 12 hr tablet Take 1 tablet (150 mg total) by mouth daily for 7 days, THEN 1 tablet (150 mg total) 2 (two) times daily (Patient not taking: Reported on 11/09/2022) 21 tablet 0   No  facility-administered medications prior to visit.   Allergies  Allergen Reactions   Cortisone Palpitations, Other (See Comments) and Nausea And Vomiting    hyperactivity hyperactivity   Short Ragweed Pollen Ext Itching   Grass Pollen(K-O-R-T-Swt Vern) Rash  Other Other (See Comments), Hives, Itching and Nausea Only    Other reaction(s): Headache   Pseudoephedrine Other (See Comments)    tachycardia tachycardia   Atorvastatin Other (See Comments)    Low dose   Codeine    Fluoxetine Hcl Other (See Comments)   Metformin Other (See Comments)    04/15/15 caused dizziness   Metformin And Related     04/15/15 caused dizziness   Pramipexole     nightmares   Shellfish Allergy Other (See Comments)    [other]   Objective:   Today's Vitals   11/09/22 0836  BP: 122/68  Pulse: (!) 55  Temp: 98.1 F (36.7 C)  TempSrc: Temporal  SpO2: 95%  Weight: 174 lb 12.8 oz (79.3 kg)  Height: '5\' 3"'$  (1.6 m)   Body mass index is 30.96 kg/m.   General: Well developed, well nourished. No acute distress. Psych: Alert and oriented. Normal mood and affect.  Health Maintenance Due  Topic Date Due   FOOT EXAM  08/15/2022   Lab Results Component Ref Range & Units 10:52 (11/09/22)  Color, UA  dark yellow  Clarity, UA  clear  Glucose, UA Negative Negative  Bilirubin, UA  1+  Ketones, UA  neg  Spec Grav, UA 1.010 - 1.025 1.015  Blood, UA  10 ery/ul  pH, UA 5.0 - 8.0 6.0  Protein, UA Negative Negative  Urobilinogen, UA 0.2 or 1.0 E.U./dL 1.0  Nitrite, UA  positive  Leukocytes, UA Negative Moderate (2+) Abnormal       Assessment & Plan:   Problem List Items Addressed This Visit       Digestive   Gastroesophageal reflux disease    I will renew her famotidine.      Relevant Medications   famotidine (PEPCID) 40 MG tablet     Endocrine   Type 2 diabetes mellitus with retinopathy ([place]) - Primary    I will renew glimepiride 1 mg bid.      Relevant Medications   glimepiride (AMARYL) 1  MG tablet     Other   Recurrent major depressive disorder (HCC)    Mood is improved. Continue following with Dr. Barbie Banner.      Degenerative lumbar spinal stenosis    Recently referred to neurosurgery.      Other Visit Diagnoses     Acute cystitis without hematuria       UA consistent wiht an acute cystitis. I will send this for a culture. I will treat her with Septra.   Relevant Medications   sulfamethoxazole-trimethoprim (BACTRIM DS) 800-160 MG tablet   Other Relevant Orders   POCT Urinalysis Dipstick (Completed)   Urine Culture       Return in about 6 weeks (around 12/21/2022) for Reassessment.   Haydee Salter, MD

## 2022-11-09 NOTE — Assessment & Plan Note (Signed)
I will renew her famotidine.

## 2022-11-09 NOTE — Assessment & Plan Note (Signed)
Mood is improved. Continue following with Dr. Barbie Banner.

## 2022-11-09 NOTE — Assessment & Plan Note (Signed)
I will renew glimepiride 1 mg bid.

## 2022-11-11 DIAGNOSIS — R69 Illness, unspecified: Secondary | ICD-10-CM | POA: Diagnosis not present

## 2022-11-11 DIAGNOSIS — F4310 Post-traumatic stress disorder, unspecified: Secondary | ICD-10-CM | POA: Diagnosis not present

## 2022-11-11 DIAGNOSIS — F339 Major depressive disorder, recurrent, unspecified: Secondary | ICD-10-CM | POA: Diagnosis not present

## 2022-11-11 LAB — URINE CULTURE
MICRO NUMBER:: 14645004
SPECIMEN QUALITY:: ADEQUATE

## 2022-11-18 DIAGNOSIS — M5416 Radiculopathy, lumbar region: Secondary | ICD-10-CM | POA: Diagnosis not present

## 2022-11-18 DIAGNOSIS — M48062 Spinal stenosis, lumbar region with neurogenic claudication: Secondary | ICD-10-CM | POA: Diagnosis not present

## 2022-11-18 DIAGNOSIS — M5126 Other intervertebral disc displacement, lumbar region: Secondary | ICD-10-CM | POA: Diagnosis not present

## 2022-12-22 ENCOUNTER — Encounter: Payer: Self-pay | Admitting: Pharmacist

## 2022-12-22 NOTE — Progress Notes (Signed)
[facility] Customer service manager [facility] Pharmacy Team Statin Quality Measure Assessment   12/22/2022  Destiny Romero [birth year] [id number]  Per review of chart and payor information, Destiny Romero has a diagnosis of diabetes but is not currently filling a statin prescription.  This places patient into the Statin Use In Patients with Diabetes (SUPD) measure for CMS.    Patient has documented allergy to statin but no corresponding CPT codes that would exclude patient from SUPD measure. If deemed clinically appropriate, please evaluate and code Destiny Romero's statin intolerance at tomorrow's office visit. Please consider ONE of the following recommendations:   Initiate moderate intensity statin Atorvastatin 10 mg once daily, #90, 3 refills   Rosuvastatin 5 mg once daily, #90, 3 refills    Initiate low intensity          statin with reduced frequency if prior          statin intolerance 1x weekly, #13, 3 refills   2x weekly, #26, 3 refills   3x weekly, #39, 3 refills    Code for past statin intolerance or  other exclusions (required annually)  Provider Requirements: Associate code during an office visit or telehealth encounter  Drug Induced Myopathy G72.0   Myopathy, unspecified G72.9   Myositis, unspecified M60.9   Rhabdomyolysis M62.82   Cirrhosis of liver K74.69   Prediabetes R73.03   PCOS E28.2   Thank you for allowing [facility] pharmacy to be a part of this patient's care. Dellie Burns, PharmD [facility]  [facility] Clinical Pharmacist Office: [phone]

## 2022-12-23 ENCOUNTER — Ambulatory Visit (INDEPENDENT_AMBULATORY_CARE_PROVIDER_SITE_OTHER): Payer: Medicare HMO | Admitting: Family Medicine

## 2022-12-23 ENCOUNTER — Encounter: Payer: Self-pay | Admitting: Family Medicine

## 2022-12-23 VITALS — BP 118/66 | HR 73 | Temp 98.4°F | Ht 63.0 in | Wt 174.6 lb

## 2022-12-23 DIAGNOSIS — E785 Hyperlipidemia, unspecified: Secondary | ICD-10-CM | POA: Diagnosis not present

## 2022-12-23 DIAGNOSIS — E11319 Type 2 diabetes mellitus with unspecified diabetic retinopathy without macular edema: Secondary | ICD-10-CM

## 2022-12-23 DIAGNOSIS — Z9889 Other specified postprocedural states: Secondary | ICD-10-CM | POA: Diagnosis not present

## 2022-12-23 LAB — GLUCOSE, RANDOM: Glucose, Bld: 150 mg/dL — ABNORMAL HIGH (ref 70–99)

## 2022-12-23 LAB — HEMOGLOBIN A1C: Hgb A1c MFr Bld: 7.5 % — ABNORMAL HIGH (ref 4.6–6.5)

## 2022-12-23 MED ORDER — PIOGLITAZONE HCL 30 MG PO TABS: 15.0000 mg | ORAL_TABLET | Freq: Every day | ORAL | 3 refills | Status: DC

## 2022-12-23 MED ORDER — ROSUVASTATIN CALCIUM 5 MG PO TABS: 5.0000 mg | ORAL_TABLET | Freq: Every day | ORAL | 3 refills | Status: DC

## 2022-12-23 NOTE — Assessment & Plan Note (Signed)
Continue glimepiride 1 mg bid and pioglitazone 15 mg daily. I will check her A1c today.

## 2022-12-23 NOTE — Assessment & Plan Note (Signed)
Discussed increased risk of CVD for patients with diabetes. We will try restarting her on rosuvastatin.

## 2022-12-23 NOTE — Addendum Note (Signed)
Addended by: Loyola Mast on: 12/23/2022 03:06 PM   Modules accepted: Orders

## 2022-12-23 NOTE — Progress Notes (Signed)
[facility] [address] [place] [address] Dept: [phone] Dept Fax: [phone]  Chronic Care Office Visit  Subjective:    Patient ID: Destiny Romero, female    DOB: 1951/08/22, 72 y.o..   MRN: [id number]  Chief Complaint  Patient presents with   Medical Management of Chronic Issues    6 week f/u. No concerns.     History of Present Illness:  Patient is in today for reassessment of chronic medical issues.  Destiny Romero has a history of type 2 diabetes. She is currently managed on glimepiride 1 mg bid and pioglitazone 15 mg daily.   Destiny Romero has a history of hyperlipidemia. She had previously been managed on a low dose of rosuvastatin. She does not recall why she stopped taking this last Fall. She had past issues with atorvastatin, so was takign the rosuvastatin less than daily.  Past Medical History: Patient Active Problem List   Diagnosis Date Noted   H/O vitrectomy 07/02/2022   Epiretinal membrane, right eye 06/02/2022   Macular pucker, right eye 05/28/2022   HSV-2 (herpes simplex virus 2) infection 04/09/2022   Chronic rhinitis 04/09/2022   Posttraumatic stress disorder 01/07/2022   Arthritis of hand 11/21/2021   History of premalignant rectal tumor 11/13/2021   Degenerative lumbar spinal stenosis 03/03/2021   Lumbar radiculopathy 01/22/2021   Osteoarthritis of both hands 11/10/2020   Carpal tunnel syndrome of left wrist 04/11/2020   Gastroesophageal reflux disease 11/27/2019   Atypical chest pain 11/07/2018   BMI 29.0-29.9,adult 11/07/2018   Dyspnea 11/07/2018   Dupuytren's contracture of both hands 05/30/2018   Constipation 06/08/2016   Decreased visual acuity 06/08/2016   Type 2 diabetes mellitus with retinopathy 08/11/2013   Osteopenia 08/19/2012   Hyperlipidemia 06/27/2008   History of colonic polyps 06/27/2008   Recurrent major depressive disorder 01/19/2007   Past Surgical History:   Procedure Laterality Date   CARPAL TUNNEL RELEASE Right 05/2020   CATARACT EXTRACTION, BILATERAL Bilateral    cataracts   CERVICAL CONE BIOPSY     COLONOSCOPY  2003   COLONOSCOPY  04/01/2011   [place] - repeat in 5 years   COLONOSCOPY W/ POLYPECTOMY  2007   Dr Kinnie Scales   EXCISION MORTON'S NEUROMA Right    EYE SURGERY     07/02/22, 05/31/22   LAMINECTOMY     1982   LUMBAR LAMINECTOMY Left 11/2022   L4-L5   OOPHORECTOMY Left 12/2011   Dr Nicholas Lose ; ovarian cyst   POLYPECTOMY     RECTAL SURGERY  05/28/2006   pre-canerous lesion removed in 2007; Dr Byrd Hesselbach, [place]   TUBAL LIGATION     Family History  Problem Relation Age of Onset   Diabetes Mother    Dementia Mother    Prostate cancer Father    COPD Father    Heart disease Father        CAD - used nitroglycerin tablets   Diabetes Sister        obese   Thyroid cancer Sister    Cancer Brother        non Hodgkin's Lymphoma, leukemia   Diabetes Brother        not obese   Kidney disease Brother        Kidney failure   Diabetes Brother    Kidney failure Brother    Diabetes Maternal Aunt        X5 ; all IDDM   Heart disease Maternal Aunt        "  enlarged heart"   Heart disease Maternal Uncle        "enlarged heart"   Diabetes Maternal Grandmother        IDDM   Diabetes Paternal Grandmother        IDDM   Heart disease Paternal Grandmother    Heart disease Paternal Grandfather        CAD - used nitroglycerin tablets   Colon cancer Neg Hx    Esophageal cancer Neg Hx    Stomach cancer Neg Hx    Stroke Neg Hx    Colon polyps Neg Hx    Rectal cancer Neg Hx    Outpatient Medications Prior to Visit  Medication Sig Dispense Refill   Calcium 200 MG TABS Take by mouth. Tale [phone] mg daily     cetirizine (ZYRTEC) 10 MG tablet Take by mouth.     Cholecalciferol (VITAMIN D) 2000 units CAPS daily.     estradiol (ESTRACE) 0.1 MG/GM vaginal cream Place 1 g vaginally 2 (two) times a week.     famotidine (PEPCID) 40 MG tablet TAKE  ONE TABLET BY MOUTH DAILY AT BEDTIME 90 tablet 0   glimepiride (AMARYL) 1 MG tablet Take 1 tablet (1 mg total) by mouth in the morning and at bedtime. 180 tablet 3   Glucosamine-Chondroitin 1500-1200 MG/30ML LIQD daily.     glucose blood (ONETOUCH VERIO) test strip Use as instructed 300 strip 12   lamoTRIgine (LAMICTAL) 200 MG tablet Take 200 mg by mouth daily.     lidocaine (XYLOCAINE) 5 % ointment Apply 1 application. topically as needed. 30 g 0   Multiple Vitamin (MULTI VITAMIN) TABS daily.     naproxen (NAPROSYN) 500 MG tablet Take by mouth.     omega-3 acid ethyl esters (LOVAZA) 1 g capsule Take by mouth daily.     OneTouch Delica Lancets 33G MISC Use to test blood sugar 3X daily.  Dx Code: E11.65 300 each 1   pioglitazone (ACTOS) 15 MG tablet Take 1 tablet (15 mg total) by mouth daily. 30 tablet 5   sulfamethoxazole-trimethoprim (BACTRIM DS) 800-160 MG tablet Take 1 tablet by mouth 2 (two) times daily. 6 tablet 0   UNABLE TO FIND Med Name: CBD     No facility-administered medications prior to visit.   Allergies  Allergen Reactions   Cortisone Palpitations, Other (See Comments) and Nausea And Vomiting    hyperactivity hyperactivity   Short Ragweed Pollen Ext Itching   Grass Pollen(K-O-R-T-Swt Vern) Rash   Other Other (See Comments), Hives, Itching and Nausea Only    Other reaction(s): Headache   Pseudoephedrine Other (See Comments)    tachycardia tachycardia   Atorvastatin Other (See Comments)    Low dose   Codeine    Fluoxetine Hcl Other (See Comments)   Metformin Other (See Comments)    04/15/15 caused dizziness   Metformin And Related     04/15/15 caused dizziness   Pramipexole     nightmares   Shellfish Allergy Other (See Comments)    [other]   Objective:   Today's Vitals   12/23/22 0758  BP: 118/66  Pulse: 73  Temp: 98.4 F (36.9 C)  TempSrc: Temporal  SpO2: 96%  Weight: 174 lb 9.6 oz (79.2 kg)  Height:  (1.6 m)   Body mass index is 30.93 kg/m.    General: Well developed, well nourished. No acute distress. Back: Surgical wound healing well without sign of infection. Feet- Skin intact. No sign of maceration between toes. Nails  are normal. Dorsalis pedis and posterior tibial artery   pulses are normal. 5.07 monofilament testing normal, except absent at right 3rd toe (history of Morton's   neuroma). Psych: Alert and oriented. Normal mood and affect.  Health Maintenance Due  Topic Date Due   Medicare Annual Wellness (AWV)  02/19/2023     Assessment & Plan:   Problem List Items Addressed This Visit       Endocrine   Type 2 diabetes mellitus with retinopathy - Primary    Continue glimepiride 1 mg bid and pioglitazone 15 mg daily. I will check her A1c today.      Relevant Medications   rosuvastatin (CRESTOR) 5 MG tablet   Other Relevant Orders   Glucose, random   Hemoglobin A1c     Other   Hyperlipidemia    Discussed increased risk of CVD for patients with diabetes. We will try restarting her on rosuvastatin.       Relevant Medications   rosuvastatin (CRESTOR) 5 MG tablet   Other Visit Diagnoses     Status post lumbar laminectomy       Recovering well at this point. She will cotninue to follow with her surgeon.       Return in about 3 months (around 03/24/2023) for Reassessment.   Loyola Mast, MD

## 2022-12-25 ENCOUNTER — Encounter: Payer: Self-pay | Admitting: Family Medicine

## 2022-12-28 ENCOUNTER — Other Ambulatory Visit: Payer: Self-pay

## 2022-12-28 DIAGNOSIS — E11319 Type 2 diabetes mellitus with unspecified diabetic retinopathy without macular edema: Secondary | ICD-10-CM

## 2022-12-28 MED ORDER — PIOGLITAZONE HCL 30 MG PO TABS: 30.0000 mg | ORAL_TABLET | Freq: Every day | ORAL | 2 refills | Status: DC

## 2023-01-05 ENCOUNTER — Ambulatory Visit: Payer: Medicare HMO | Admitting: Nurse Practitioner

## 2023-01-18 ENCOUNTER — Other Ambulatory Visit (HOSPITAL_COMMUNITY)
Admission: RE | Admit: 2023-01-18 | Discharge: 2023-01-18 | Disposition: A | Discharge status: Home / Self Care | Payer: Medicare HMO | Source: Ambulatory Visit | Attending: Obstetrics and Gynecology | Admitting: Obstetrics and Gynecology

## 2023-01-18 ENCOUNTER — Ambulatory Visit (INDEPENDENT_AMBULATORY_CARE_PROVIDER_SITE_OTHER): Payer: Medicare HMO | Admitting: Obstetrics and Gynecology

## 2023-01-18 ENCOUNTER — Encounter: Payer: Self-pay | Admitting: Obstetrics and Gynecology

## 2023-01-18 VITALS — BP 130/70 | HR 59 | Ht 63.5 in | Wt 176.0 lb

## 2023-01-18 DIAGNOSIS — Z114 Encounter for screening for human immunodeficiency virus [HIV]: Secondary | ICD-10-CM

## 2023-01-18 DIAGNOSIS — B009 Herpesviral infection, unspecified: Secondary | ICD-10-CM | POA: Diagnosis not present

## 2023-01-18 DIAGNOSIS — Z8742 Personal history of other diseases of the female genital tract: Secondary | ICD-10-CM | POA: Insufficient documentation

## 2023-01-18 DIAGNOSIS — Z1151 Encounter for screening for human papillomavirus (HPV): Secondary | ICD-10-CM | POA: Diagnosis not present

## 2023-01-18 DIAGNOSIS — Z113 Encounter for screening for infections with a predominantly sexual mode of transmission: Secondary | ICD-10-CM

## 2023-01-18 DIAGNOSIS — R300 Dysuria: Secondary | ICD-10-CM | POA: Diagnosis not present

## 2023-01-18 DIAGNOSIS — N950 Postmenopausal bleeding: Secondary | ICD-10-CM

## 2023-01-18 DIAGNOSIS — Z124 Encounter for screening for malignant neoplasm of cervix: Secondary | ICD-10-CM

## 2023-01-18 DIAGNOSIS — R102 Pelvic and perineal pain: Secondary | ICD-10-CM | POA: Diagnosis not present

## 2023-01-18 DIAGNOSIS — N898 Other specified noninflammatory disorders of vagina: Secondary | ICD-10-CM | POA: Diagnosis not present

## 2023-01-18 DIAGNOSIS — Z1159 Encounter for screening for other viral diseases: Secondary | ICD-10-CM

## 2023-01-18 NOTE — Progress Notes (Unsigned)
GYNECOLOGY  VISIT   HPI: 72 y.o.   Single  Caucasian  female   G0P0000 with Patient's last menstrual period was 04/07/2001 (approximate).   here for  vaginal infection. Pt has had spotting after intercourse, followed by discharge about 10 days ago. Pt cannot smell odor.  Some burning.  No itching. Some discolored discharge.   States it is not unusual to have spotting after intercourse.  With recent intercourse, she developed bleeding 2 - 3 days later, and then developed bleeding like a light period a couple of days later.  No discomfort.   Same partner for 14 years.  Wants STD screening.   Using vaginal estradiol cream, last about 2 weeks ago.   Job is stressful.  Brothers with health problems.  GYNECOLOGIC HISTORY: Patient's last menstrual period was 04/07/2001 (approximate). Contraception:  PMP Menopausal hormone therapy:  estrace Last mammogram:  08/11/22 Breast Density Cat A, BI-RADS CAT 1 neg Last pap smear:   01/01/22 LSIL: HR HPV neg, 07/01/16 neg        OB History     Gravida  0   Para  0   Term  0   Preterm  0   AB  0   Living  0      SAB  0   IAB  0   Ectopic  0   Multiple  0   Live Births  0              Patient Active Problem List   Diagnosis Date Noted   H/O vitrectomy 07/02/2022   Epiretinal membrane, right eye 06/02/2022   Macular pucker, right eye 05/28/2022   HSV-2 (herpes simplex virus 2) infection 04/09/2022   Chronic rhinitis 04/09/2022   Posttraumatic stress disorder 01/07/2022   Arthritis of hand 11/21/2021   History of premalignant rectal tumor 11/13/2021   Degenerative lumbar spinal stenosis 03/03/2021   Lumbar radiculopathy 01/22/2021   Osteoarthritis of both hands 11/10/2020   Carpal tunnel syndrome of left wrist 04/11/2020   Gastroesophageal reflux disease 11/27/2019   Atypical chest pain 11/07/2018   BMI 29.0-29.9,adult 11/07/2018   Dyspnea 11/07/2018   Dupuytren's contracture of both hands 05/30/2018   Constipation  06/08/2016   Decreased visual acuity 06/08/2016   Type 2 diabetes mellitus with retinopathy (HCC) 08/11/2013   Osteopenia 08/19/2012   Hyperlipidemia 06/27/2008   History of colonic polyps 06/27/2008   Recurrent major depressive disorder (HCC) 01/19/2007    Past Medical History:  Diagnosis Date   Allergy    Anal lesion 2007   Anemia    Anesthesia complication    Per pt/ sensitive to sedation!   Arthritis    fingers,toes   Cancer (HCC)    "PRECANCEROUS LESIONS IN RECTUM"   Cataract    bilateral,removed   Central retinal artery occlusion 06/08/2016   limited vision right eye.   Cervical dysplasia 04/1991   Dr Laurena Bering - CIN I, cone and freeze in 1990s with normal paps since   Constipation 06/08/2016   moves bowels every 3 rd day    Decreased visual acuity 06/08/2016   Retinal bleed right eye 2017   Depression    Diabetes mellitus without complication (HCC)    Fatigue 06/27/2017   H/O measles    H/O mumps    History of chicken pox    Hyperlipidemia    Mitral valve prolapse    NO MR; no SBE prophylaxis needed   Osteoporosis    PONV (postoperative nausea and vomiting)  Pre-diabetes    highest A1c 6.4 %   Preventative health care 12/21/2016   Squamous cell carcinoma in situ of skin    Dr Hortense Ramal   STD (sexually transmitted disease)    HSV   Vision impairment    limited vision in right eye.    Past Surgical History:  Procedure Laterality Date   CARPAL TUNNEL RELEASE Right 05/2020   CATARACT EXTRACTION, BILATERAL Bilateral    cataracts   CERVICAL CONE BIOPSY     COLONOSCOPY  2003   COLONOSCOPY  04/01/2011   [place] - repeat in 5 years   COLONOSCOPY W/ POLYPECTOMY  2007   Dr Kinnie Scales   EXCISION MORTON'S NEUROMA Right    EYE SURGERY     07/02/22, 05/31/22   LAMINECTOMY     1982   LUMBAR LAMINECTOMY Left 11/2022   L4-L5   OOPHORECTOMY Left 12/2011   Dr Nicholas Lose ; ovarian cyst   POLYPECTOMY     RECTAL SURGERY  05/28/2006   pre-canerous lesion removed in 2007;  Dr Byrd Hesselbach, [place]   TUBAL LIGATION      Current Outpatient Medications  Medication Sig Dispense Refill   Calcium 200 MG TABS Take by mouth. Tale [phone] mg daily     cetirizine (ZYRTEC) 10 MG tablet Take by mouth.     Cholecalciferol (VITAMIN D) 2000 units CAPS daily.     estradiol (ESTRACE) 0.1 MG/GM vaginal cream Place 1 g vaginally 2 (two) times a week.     famotidine (PEPCID) 40 MG tablet TAKE ONE TABLET BY MOUTH DAILY AT BEDTIME 90 tablet 0   glimepiride (AMARYL) 1 MG tablet Take 1 tablet (1 mg total) by mouth in the morning and at bedtime. 180 tablet 3   Glucosamine-Chondroitin 1500-1200 MG/30ML LIQD daily.     glucose blood (ONETOUCH VERIO) test strip Use as instructed 300 strip 12   lamoTRIgine (LAMICTAL) 200 MG tablet Take 200 mg by mouth daily.     lidocaine (XYLOCAINE) 5 % ointment Apply 1 application. topically as needed. 30 g 0   Multiple Vitamin (MULTI VITAMIN) TABS daily.     naproxen (NAPROSYN) 500 MG tablet Take by mouth.     omega-3 acid ethyl esters (LOVAZA) 1 g capsule Take by mouth daily.     OneTouch Delica Lancets 33G MISC Use to test blood sugar 3X daily.  Dx Code: E11.65 300 each 1   pioglitazone (ACTOS) 30 MG tablet Take 1 tablet (30 mg total) by mouth daily. 90 tablet 2   rosuvastatin (CRESTOR) 5 MG tablet Take 1 tablet (5 mg total) by mouth daily. 90 tablet 3   No current facility-administered medications for this visit.     ALLERGIES: Cortisone, Short ragweed pollen ext, Grass pollen(k-o-r-t-swt vern), Other, Pseudoephedrine, Atorvastatin, Codeine, Fluoxetine hcl, Metformin, Metformin and related, Pramipexole, and Shellfish allergy  Family History  Problem Relation Age of Onset   Diabetes Mother    Dementia Mother    Prostate cancer Father    COPD Father    Heart disease Father        CAD - used nitroglycerin tablets   Diabetes Sister        obese   Thyroid cancer Sister    Cancer Brother        non Hodgkin's Lymphoma, leukemia   Diabetes Brother         not obese   Kidney disease Brother        Kidney failure   Diabetes Brother    Kidney  failure Brother    Diabetes Maternal Aunt        X5 ; all IDDM   Heart disease Maternal Aunt        "enlarged heart"   Heart disease Maternal Uncle        "enlarged heart"   Diabetes Maternal Grandmother        IDDM   Diabetes Paternal Grandmother        IDDM   Heart disease Paternal Grandmother    Heart disease Paternal Grandfather        CAD - used nitroglycerin tablets   Colon cancer Neg Hx    Esophageal cancer Neg Hx    Stomach cancer Neg Hx    Stroke Neg Hx    Colon polyps Neg Hx    Rectal cancer Neg Hx     Social History   Socioeconomic History   Marital status: Single    Spouse name: Not on file   Number of children: 0   Years of education: Not on file   Highest education level: Bachelor's degree (e.g., BA, AB, BS)  Occupational History   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  Tobacco Use   Smoking status: Never   Smokeless tobacco: Never  Vaping Use   Vaping Use: Never used  Substance and Sexual Activity   Alcohol use: Yes    Comment: glass wine once a month   Drug use: No   Sexual activity: Yes    Partners: Male    Comment: 1st intercourse 72 yo-More than 5 partners  Other Topics Concern   Not on file  Social History Narrative   Lives alone   No dietary restrictions   Social Determinants of Health   Financial Resource Strain: Low Risk  (12/21/2022)   Overall Financial Resource Strain (CARDIA)    Difficulty of Paying Living Expenses: Not hard at all  Food Insecurity: No Food Insecurity (12/21/2022)   Hunger Vital Sign    Worried About Running Out of Food in the Last Year: Never true    Ran Out of Food in the Last Year: Never true  Transportation Needs: No Transportation Needs (12/21/2022)   PRAPARE - Administrator, Civil Service (Medical): No    Lack of Transportation (Non-Medical): No  Physical Activity: Inactive (12/21/2022)   Exercise  Vital Sign    Days of Exercise per Week: 0 days    Minutes of Exercise per Session: 30 min  Stress: Stress Concern Present (12/21/2022)   [employer] of Occupational Health - Occupational Stress Questionnaire    Feeling of Stress : To some extent  Social Connections: Moderately Integrated (12/21/2022)   Social Connection and Isolation Panel [NHANES]    Frequency of Communication with Friends and Family: Once a week    Frequency of Social Gatherings with Friends and Family: Twice a week    Attends Religious Services: More than 4 times per year    Active Member of [employer] or Organizations: Yes    Attends Banker Meetings: More than 4 times per year    Marital Status: Divorced  Intimate Partner Violence: Not At Risk (02/18/2022)   Humiliation, Afraid, Rape, and Kick questionnaire    Fear of Current or Ex-Partner: No    Emotionally Abused: No    Physically Abused: No    Sexually Abused: No    Review of Systems  Genitourinary:  Positive for vaginal discharge.    PHYSICAL EXAMINATION:    BP 130/70 (BP Location: Right Arm, Patient  Position: Sitting, Cuff Size: Normal)   Pulse (!) 59   Ht 5' 3.5" (1.613 m)   Wt 176 lb (79.8 kg)   LMP 04/07/2001 (Approximate)   SpO2 96%   BMI 30.69 kg/m     General appearance: alert, cooperative and appears stated age  Pelvic: External genitalia:  no lesions              Urethra:  normal appearing urethra with no masses or tenderness.              Bartholins and Skenes: normal                 Vagina: normal appearing vagina with normal color and discharge, no lesions               Scattered 3 - 4 mm flat areas of erythema of the vagina.  Possible atrophy.               Cervix: no lesions.   No CMT.                Bimanual Exam:  Uterus:  normal size, contour, position, consistency, mobility, non-tender.  Tender with pelvic exam.              Adnexa: no mass, fullness, tenderness      Chaperone was present for exam:  emily F,  CMA  ASSESSMENT  Postmenopausal bleeding.  LGSIL pap in 2023.  Hx cervical cryotherapy and conization.  Pelvic pain.  STD screening. DM.  HSV.  PLAN  Pap and HR HPV collected.  STD screening.  Vaginitis testing.  Urinalysis and reflex culture.  Causes of postmenopausal bleeding discussed:  atrophy, infection, polyps, cancer and precancer.  Return for pelvic ultrasound and endometrial biopsy.  Rationale and procedures explained.   28 min  total time was spent for this patient encounter, including preparation, face-to-face counseling with the patient, coordination of care, and documentation of the encounter.

## 2023-01-18 NOTE — Patient Instructions (Signed)
Postmenopausal Bleeding Postmenopausal bleeding is any bleeding that a woman has after she has entered menopause. Menopause is the end of a woman's fertile years. After menopause, a woman no longer ovulates and does not have menstrual periods. Therefore, she should no longer have bleeding from her vagina. Postmenopausal bleeding may have various causes, including: Menopausal hormone therapy (MHT). Endometrial atrophy. After menopause, low estrogen hormone levels cause the membrane that lines the uterus (endometrium) to become thin. You may have bleeding as the endometrium thins. Endometrial hyperplasia. This condition is caused by excess estrogen hormones and low levels of progesterone hormones. The excess estrogen causes the endometrium to thicken, which can lead to bleeding. In some cases, this can lead to cancer of the uterus. Endometrial cancer. Noncancerous growths (polyps) on the endometrium, the lining of the uterus, or the cervix. Uterine fibroids. These are noncancerous growths in or around the uterus muscle tissue that can cause heavy bleeding. Any type of postmenopausal bleeding, even if it appears to be a typical menstrual period, should be checked by your health care provider. Treatment will depend on the cause of the bleeding. Follow these instructions at home:  Pay attention to any changes in your symptoms. Let your health care provider know about them. Avoid using tampons and douches as told by your health care provider. Change your pads regularly. Get regular pelvic exams, including Pap tests, as told by your health care provider. Take iron supplements as told by your health care provider. Take over-the-counter and prescription medicines only as told by your health care provider. Keep all follow-up visits. This is important. Contact a health care provider if: You have new bleeding from the vagina after menopause. You have pain in your abdomen. Get help right away if: You have  a fever or chills. You have severe pain with bleeding. You are passing blood clots. You have heavy bleeding, need more than 1 pad an hour, and have never experienced this before. You have headaches or feel faint or dizzy. Summary Postmenopausal bleeding is any bleeding that a woman has after she has entered into menopause. Postmenopausal bleeding may have various causes. Treatment will depend on the cause of the bleeding. Any type of postmenopausal bleeding, even if it appears to be a typical menstrual period, should be checked by your health care provider. Be sure to pay attention to any changes in your symptoms and keep all follow-up visits. This information is not intended to replace advice given to you by your health care provider. Make sure you discuss any questions you have with your health care provider. Document Revised: 02/08/2020 Document Reviewed: 02/08/2020 Elsevier Patient Education  2023 Elsevier Inc.  

## 2023-01-19 ENCOUNTER — Other Ambulatory Visit: Payer: Self-pay | Admitting: *Deleted

## 2023-01-19 DIAGNOSIS — Z1159 Encounter for screening for other viral diseases: Secondary | ICD-10-CM | POA: Diagnosis not present

## 2023-01-19 DIAGNOSIS — Z114 Encounter for screening for human immunodeficiency virus [HIV]: Secondary | ICD-10-CM

## 2023-01-19 DIAGNOSIS — Z113 Encounter for screening for infections with a predominantly sexual mode of transmission: Secondary | ICD-10-CM | POA: Diagnosis not present

## 2023-01-20 LAB — CERVICOVAGINAL ANCILLARY ONLY
Bacterial Vaginitis (gardnerella): POSITIVE — AB
Candida Glabrata: NEGATIVE
Candida Vaginitis: NEGATIVE
Chlamydia: NEGATIVE
Comment: NEGATIVE
Comment: NEGATIVE
Comment: NEGATIVE
Comment: NEGATIVE
Comment: NEGATIVE
Comment: NORMAL
Neisseria Gonorrhea: NEGATIVE
Trichomonas: NEGATIVE

## 2023-01-20 LAB — URINE CULTURE
MICRO NUMBER:: 14947491
Result:: NO GROWTH
SPECIMEN QUALITY:: ADEQUATE

## 2023-01-20 LAB — URINALYSIS, COMPLETE W/RFL CULTURE
Bacteria, UA: NONE SEEN /HPF
Bilirubin Urine: NEGATIVE
Glucose, UA: NEGATIVE
Hgb urine dipstick: NEGATIVE
Hyaline Cast: NONE SEEN /LPF
Ketones, ur: NEGATIVE
Nitrites, Initial: NEGATIVE
Protein, ur: NEGATIVE
RBC / HPF: NONE SEEN /HPF (ref 0–2)
Specific Gravity, Urine: 1.006 (ref 1.001–1.035)
pH: 8.5 — ABNORMAL HIGH (ref 5.0–8.0)

## 2023-01-20 LAB — CULTURE INDICATED

## 2023-01-20 LAB — HIV ANTIBODY (ROUTINE TESTING W REFLEX): HIV 1&2 Ab, 4th Generation: NONREACTIVE

## 2023-01-20 LAB — SYPHILIS: RPR W/REFLEX TO RPR TITER AND TREPONEMAL ANTIBODIES, TRADITIONAL SCREENING AND DIAGNOSIS ALGORITHM: RPR Ser Ql: NONREACTIVE

## 2023-01-20 LAB — HEPATITIS C ANTIBODY: Hepatitis C Ab: NONREACTIVE

## 2023-01-20 NOTE — Progress Notes (Signed)
GYNECOLOGY  VISIT   HPI: 72 y.o.   Single  Caucasian  female   G0P0000 with Patient's last menstrual period was 04/07/2001 (approximate).   here for pelvic ultrasound for postmenopausal bleeding and review of test results.   Patient presented with bleeding following intercourse that stopped and then started again.   She had testing for vaginitis showing BV and negative testing for STDs . Negative UC.  Pap not back yet.   GYNECOLOGIC HISTORY: Patient's last menstrual period was 04/07/2001 (approximate). Contraception:  PMP Menopausal hormone therapy:  estrace Last mammogram:  08/11/22 Breast Density Cat A, BI-RADS CAT 1 neg  Last pap smear:  01/18/23 -pending.  01/01/22 LSIL: HR HPV neg, 07/01/16 neg         OB History     Gravida  0   Para  0   Term  0   Preterm  0   AB  0   Living  0      SAB  0   IAB  0   Ectopic  0   Multiple  0   Live Births  0              Patient Active Problem List   Diagnosis Date Noted   H/O vitrectomy 07/02/2022   Epiretinal membrane, right eye 06/02/2022   Macular pucker, right eye 05/28/2022   HSV-2 (herpes simplex virus 2) infection 04/09/2022   Chronic rhinitis 04/09/2022   Posttraumatic stress disorder 01/07/2022   Arthritis of hand 11/21/2021   History of premalignant rectal tumor 11/13/2021   Degenerative lumbar spinal stenosis 03/03/2021   Lumbar radiculopathy 01/22/2021   Osteoarthritis of both hands 11/10/2020   Carpal tunnel syndrome of left wrist 04/11/2020   Gastroesophageal reflux disease 11/27/2019   Atypical chest pain 11/07/2018   BMI 29.0-29.9,adult 11/07/2018   Dyspnea 11/07/2018   Dupuytren's contracture of both hands 05/30/2018   Constipation 06/08/2016   Decreased visual acuity 06/08/2016   Type 2 diabetes mellitus with retinopathy (HCC) 08/11/2013   Osteopenia 08/19/2012   Hyperlipidemia 06/27/2008   History of colonic polyps 06/27/2008   Recurrent major depressive disorder (HCC) 01/19/2007     Past Medical History:  Diagnosis Date   Allergy    Anal lesion 2007   Anemia    Anesthesia complication    Per pt/ sensitive to sedation!   Arthritis    fingers,toes   Cancer (HCC)    "PRECANCEROUS LESIONS IN RECTUM"   Cataract    bilateral,removed   Central retinal artery occlusion 06/08/2016   limited vision right eye.   Cervical dysplasia 04/1991   Dr Laurena Bering - CIN I, cone and freeze in 1990s with normal paps since   Constipation 06/08/2016   moves bowels every 3 rd day    Decreased visual acuity 06/08/2016   Retinal bleed right eye 2017   Depression    Diabetes mellitus without complication (HCC)    Fatigue 06/27/2017   H/O measles    H/O mumps    History of chicken pox    Hyperlipidemia    Mitral valve prolapse    NO MR; no SBE prophylaxis needed   Osteoporosis    PONV (postoperative nausea and vomiting)    Pre-diabetes    highest A1c 6.4 %   Preventative health care 12/21/2016   Squamous cell carcinoma in situ of skin    Dr Hortense Ramal   STD (sexually transmitted disease)    HSV   Vision impairment    limited vision  in right eye.    Past Surgical History:  Procedure Laterality Date   CARPAL TUNNEL RELEASE Right 05/2020   CATARACT EXTRACTION, BILATERAL Bilateral    cataracts   CERVICAL CONE BIOPSY     COLONOSCOPY  2003   COLONOSCOPY  04/01/2011   [place] - repeat in 5 years   COLONOSCOPY W/ POLYPECTOMY  2007   Dr Kinnie Scales   EXCISION MORTON'S NEUROMA Right    EYE SURGERY     07/02/22, 05/31/22   LAMINECTOMY     1982   LUMBAR LAMINECTOMY Left 11/2022   L4-L5   OOPHORECTOMY Left 12/2011   Dr Nicholas Lose ; ovarian cyst   POLYPECTOMY     RECTAL SURGERY  05/28/2006   pre-canerous lesion removed in 2007; Dr Byrd Hesselbach, [place]   TUBAL LIGATION      Current Outpatient Medications  Medication Sig Dispense Refill   Calcium 200 MG TABS Take by mouth. Tale [phone] mg daily     cetirizine (ZYRTEC) 10 MG tablet Take by mouth.     Cholecalciferol (VITAMIN D) 2000 units  CAPS daily.     estradiol (ESTRACE) 0.1 MG/GM vaginal cream Place 1 g vaginally 2 (two) times a week.     famotidine (PEPCID) 40 MG tablet TAKE ONE TABLET BY MOUTH DAILY AT BEDTIME 90 tablet 0   glimepiride (AMARYL) 1 MG tablet Take 1 tablet (1 mg total) by mouth in the morning and at bedtime. 180 tablet 3   Glucosamine-Chondroitin 1500-1200 MG/30ML LIQD daily.     glucose blood (ONETOUCH VERIO) test strip Use as instructed 300 strip 12   lamoTRIgine (LAMICTAL) 200 MG tablet Take 200 mg by mouth daily.     lidocaine (XYLOCAINE) 5 % ointment Apply 1 application. topically as needed. 30 g 0   Multiple Vitamin (MULTI VITAMIN) TABS daily.     naproxen (NAPROSYN) 500 MG tablet Take by mouth.     omega-3 acid ethyl esters (LOVAZA) 1 g capsule Take by mouth daily.     OneTouch Delica Lancets 33G MISC Use to test blood sugar 3X daily.  Dx Code: E11.65 300 each 1   pioglitazone (ACTOS) 30 MG tablet Take 1 tablet (30 mg total) by mouth daily. 90 tablet 2   rosuvastatin (CRESTOR) 5 MG tablet Take 1 tablet (5 mg total) by mouth daily. 90 tablet 3   No current facility-administered medications for this visit.     ALLERGIES: Cortisone, Short ragweed pollen ext, Grass pollen(k-o-r-t-swt vern), Other, Pseudoephedrine, Atorvastatin, Codeine, Fluoxetine hcl, Metformin, Metformin and related, Pramipexole, and Shellfish allergy  Family History  Problem Relation Age of Onset   Diabetes Mother    Dementia Mother    Prostate cancer Father    COPD Father    Heart disease Father        CAD - used nitroglycerin tablets   Diabetes Sister        obese   Thyroid cancer Sister    Cancer Brother        non Hodgkin's Lymphoma, leukemia   Diabetes Brother        not obese   Kidney disease Brother        Kidney failure   Diabetes Brother    Kidney failure Brother    Diabetes Maternal Aunt        X5 ; all IDDM   Heart disease Maternal Aunt        "enlarged heart"   Heart disease Maternal Uncle         "  enlarged heart"   Diabetes Maternal Grandmother        IDDM   Diabetes Paternal Grandmother        IDDM   Heart disease Paternal Grandmother    Heart disease Paternal Grandfather        CAD - used nitroglycerin tablets   Colon cancer Neg Hx    Esophageal cancer Neg Hx    Stomach cancer Neg Hx    Stroke Neg Hx    Colon polyps Neg Hx    Rectal cancer Neg Hx     Social History   Socioeconomic History   Marital status: Single    Spouse name: Not on file   Number of children: 0   Years of education: Not on file   Highest education level: Bachelor's degree (e.g., BA, AB, BS)  Occupational History   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[employer]  Tobacco Use   Smoking status: Never   Smokeless tobacco: Never  Vaping Use   Vaping Use: Never used  Substance and Sexual Activity   Alcohol use: Yes    Comment: glass wine once a month   Drug use: No   Sexual activity: Yes    Partners: Male    Comment: 1st intercourse 72 yo-More than 5 partners  Other Topics Concern   Not on file  Social History Narrative   Lives alone   No dietary restrictions   Social Determinants of Health   Financial Resource Strain: Low Risk  (12/21/2022)   Overall Financial Resource Strain (CARDIA)    Difficulty of Paying Living Expenses: Not hard at all  Food Insecurity: No Food Insecurity (12/21/2022)   Hunger Vital Sign    Worried About Running Out of Food in the Last Year: Never true    Ran Out of Food in the Last Year: Never true  Transportation Needs: No Transportation Needs (12/21/2022)   PRAPARE - Administrator, Civil Service (Medical): No    Lack of Transportation (Non-Medical): No  Physical Activity: Inactive (12/21/2022)   Exercise Vital Sign    Days of Exercise per Week: 0 days    Minutes of Exercise per Session: 30 min  Stress: Stress Concern Present (12/21/2022)   [employer] of Occupational Health - Occupational Stress Questionnaire    Feeling of Stress : To some extent   Social Connections: Moderately Integrated (12/21/2022)   Social Connection and Isolation Panel [NHANES]    Frequency of Communication with Friends and Family: Once a week    Frequency of Social Gatherings with Friends and Family: Twice a week    Attends Religious Services: More than 4 times per year    Active Member of [employer] or Organizations: Yes    Attends Banker Meetings: More than 4 times per year    Marital Status: Divorced  Intimate Partner Violence: Not At Risk (02/18/2022)   Humiliation, Afraid, Rape, and Kick questionnaire    Fear of Current or Ex-Partner: No    Emotionally Abused: No    Physically Abused: No    Sexually Abused: No    Review of Systems  All other systems reviewed and are negative.   PHYSICAL EXAMINATION:    BP 124/84 (BP Location: Right Arm, Patient Position: Sitting, Cuff Size: Normal)   Ht 5' 3.5" (1.613 m)   Wt 176 lb (79.8 kg)   LMP 04/07/2001 (Approximate)   BMI 30.69 kg/m     General appearance: alert, cooperative and appears stated age   Pelvic US Uterus 4.52  x 3.18 x 1.91 cm.  14 mm intramural fibroid. EMS 2.51 mm.  Avascular.  Partially intracavitary mass? Left ovary absent.  Right ovary 1.46 x 1.25 x 1.04 cm. 3 cysts:  0.92 cm, 0.66 cm, 0.80 cm.  Avascular.  Possible paraovarian cysts.  Right adnexa with fluid filled tubular area, consistent with hydrosalpinx.  No free fluid.  ASSESSMENT  Postmenopausal bleeding.  Recurrent post coital bleeding.  Possible endometrial mass.  Right ovarian cyst formation.   Right hydrosalpinx. Fibroid.  BV.  PLAN  Flagyl 500 mg po bid x 7 days.  Fu pap result.  Pelvic US images and report reviewed.  CA125.  Return for sonohysterogram and endometrial biopsy. Questions invited and answered.  35 min  total time was spent for this patient encounter, including preparation, face-to-face counseling with the patient, coordination of care, and documentation of the encounter.

## 2023-01-21 ENCOUNTER — Ambulatory Visit: Payer: Medicare HMO | Admitting: Obstetrics and Gynecology

## 2023-01-21 ENCOUNTER — Encounter: Payer: Self-pay | Admitting: Obstetrics and Gynecology

## 2023-01-21 ENCOUNTER — Ambulatory Visit (INDEPENDENT_AMBULATORY_CARE_PROVIDER_SITE_OTHER): Payer: Medicare HMO

## 2023-01-21 VITALS — BP 124/84 | Ht 63.5 in | Wt 176.0 lb

## 2023-01-21 DIAGNOSIS — N950 Postmenopausal bleeding: Secondary | ICD-10-CM

## 2023-01-21 DIAGNOSIS — B9689 Other specified bacterial agents as the cause of diseases classified elsewhere: Secondary | ICD-10-CM

## 2023-01-21 DIAGNOSIS — N760 Acute vaginitis: Secondary | ICD-10-CM

## 2023-01-21 DIAGNOSIS — N83201 Unspecified ovarian cyst, right side: Secondary | ICD-10-CM | POA: Diagnosis not present

## 2023-01-21 DIAGNOSIS — Z85048 Personal history of other malignant neoplasm of rectum, rectosigmoid junction, and anus: Secondary | ICD-10-CM | POA: Diagnosis not present

## 2023-01-21 DIAGNOSIS — D219 Benign neoplasm of connective and other soft tissue, unspecified: Secondary | ICD-10-CM

## 2023-01-21 DIAGNOSIS — N7011 Chronic salpingitis: Secondary | ICD-10-CM | POA: Diagnosis not present

## 2023-01-21 MED ORDER — METRONIDAZOLE 500 MG PO TABS
500.0000 mg | ORAL_TABLET | Freq: Two times a day (BID) | ORAL | 0 refills | Status: DC
Start: 2023-01-21 — End: 2023-02-10

## 2023-01-21 NOTE — Patient Instructions (Signed)
Metronidazole Capsules or Tablets What is this medication? METRONIDAZOLE (me troe NI da zole) treats infections caused by bacteria or parasites. It belongs to a group of medications called antibiotics. It will not treat colds, the flu, or infections caused by viruses. This medicine may be used for other purposes; ask your health care provider or pharmacist if you have questions. COMMON BRAND NAME(S): Flagyl What should I tell my care team before I take this medication? They need to know if you have any of these conditions: Cockayne syndrome History of blood diseases, such as sickle cell anemia, anemia, or leukemia Frequently drink alcohol Irregular heartbeat or rhythm Kidney disease Liver disease Yeast or fungal infection An unusual or allergic reaction to metronidazole, other medications, foods, dyes, or preservatives Pregnant or trying to get pregnant Breastfeeding How should I use this medication? Take this medication by mouth with water. Take it as directed on the prescription label at the same time every day. Take all of this medication unless your care team tells you to stop it early. Keep taking it even if you think you are better. Talk to your care team about the use of this medication in children. While it may be prescribed for children for selected conditions, precautions do apply. Overdosage: If you think you have taken too much of this medicine contact a poison control center or emergency room at once. NOTE: This medicine is only for you. Do not share this medicine with others. What if I miss a dose? If you miss a dose, take it as soon as you can. If it is almost time for your next dose, take only that dose. Do not take double or extra doses. What may interact with this medication? Do not take this medication with any of the following: Alcohol or any product containing alcohol Cisapride Disulfiram Dronedarone Pimozide Thioridazine This medication may also interact with the  following: Busulfan Carbamazepine Certain medications that treat or prevent blood clots, such as warfarin Cimetidine Estrogen or progestin hormones Lithium Other medications that cause heart rhythm changes Phenobarbital Phenytoin This list may not describe all possible interactions. Give your health care provider a list of all the medicines, herbs, non-prescription drugs, or dietary supplements you use. Also tell them if you smoke, drink alcohol, or use illegal drugs. Some items may interact with your medicine. What should I watch for while using this medication? Visit your care team for regular checks on your progress. Tell your care team if your symptoms do not start to get better or if they get worse. Some products may contain alcohol. Ask your care team if this medication contains alcohol. Be sure to tell all care teams you are taking this medication. Certain medications, such as metronidazole and disulfiram, can cause an unpleasant reaction when taken with alcohol. The reaction includes flushing, headache, nausea, vomiting, sweating, and increased thirst. The reaction can last from 30 minutes to several hours. If you are being treated for a sexually transmitted infection (STI), avoid sexual contact until you have finished your treatment. Your partner may also need treatment. Estrogen and progestin hormones may not work as well while you are taking this medication. A barrier contraceptive, such as a condom or diaphragm, is recommended if you are using these hormones for contraception. Talk to your care team about effective forms of contraception. What side effects may I notice from receiving this medication? Side effects that you should report to your care team as soon as possible: Allergic reactions--skin rash, itching, hives, swelling of  the face, lips, tongue, or throat Dizziness, loss of balance or coordination, confusion or trouble speaking Fever, neck pain or stiffness, sensitivity to  light, headache, nausea, vomiting, confusion Heart rhythm changes--fast or irregular heartbeat, dizziness, feeling faint or lightheaded, chest pain, trouble breathing Liver injury--right upper belly pain, loss of appetite, nausea, light-colored stool, dark yellow or brown urine, yellowing skin or eyes, unusual weakness or fatigue Pain, tingling, or numbness in the hands or feet Redness, blistering, peeling, or loosening of the skin, including inside the mouth Seizures Severe diarrhea, fever Sudden eye pain or change in vision such as blurry vision, seeing halos around lights, vision loss Unusual vaginal discharge, itching, or odor Side effects that usually do not require medical attention (report to your care team if they continue or are bothersome): Diarrhea Metallic taste in mouth Nausea Stomach pain This list may not describe all possible side effects. Call your doctor for medical advice about side effects. You may report side effects to FDA at 1-800-FDA-1088. Where should I keep my medication? Keep out of the reach of children and pets. Store between 15 and 25 degrees C (59 and 77 degrees F). Protect from light. Get rid of any unused medication after the expiration date. To get rid of medications that are no longer needed or have expired: Take the medication to a medication take-back program. Check with your pharmacy or law enforcement to find a location. If you cannot return the medication, check the label or package insert to see if the medication should be thrown out in the garbage or flushed down the toilet. If you are not sure, ask your care team. If it is safe to put it in the trash, take the medication out of the container. Mix the medication with cat litter, dirt, coffee grounds, or other unwanted substance. Seal the mixture in a bag or container. Put it in the trash. NOTE: This sheet is a summary. It may not cover all possible information. If you have questions about this medicine,  talk to your doctor, pharmacist, or health care provider.  2023 Elsevier/Gold Standard (2007-10-15 00:00:00)

## 2023-01-22 LAB — CA 125: CA 125: 3 U/mL (ref <35)

## 2023-01-25 LAB — CYTOLOGY - PAP
Comment: NEGATIVE
Diagnosis: NEGATIVE
Diagnosis: REACTIVE
High risk HPV: NEGATIVE

## 2023-01-27 ENCOUNTER — Ambulatory Visit (INDEPENDENT_AMBULATORY_CARE_PROVIDER_SITE_OTHER): Payer: Medicare HMO | Admitting: Family Medicine

## 2023-01-27 VITALS — BP 124/70 | HR 56 | Temp 98.2°F | Ht 63.5 in | Wt 177.2 lb

## 2023-01-27 DIAGNOSIS — Z7984 Long term (current) use of oral hypoglycemic drugs: Secondary | ICD-10-CM | POA: Diagnosis not present

## 2023-01-27 DIAGNOSIS — N1831 Chronic kidney disease, stage 3a: Secondary | ICD-10-CM

## 2023-01-27 DIAGNOSIS — E11319 Type 2 diabetes mellitus with unspecified diabetic retinopathy without macular edema: Secondary | ICD-10-CM

## 2023-01-27 MED ORDER — ONETOUCH DELICA LANCETS 33G MISC
1 refills | Status: AC
Start: 2023-01-27 — End: ?

## 2023-01-27 NOTE — Progress Notes (Signed)
[facility] [address] [place] [address] Dept: [phone] Dept Fax: [phone]  Chronic Care Office Visit  Subjective:    Patient ID: Destiny Romero, female    DOB: December 28, 1950, 72 y.o..   MRN: [id number]  Chief Complaint  Patient presents with   Medical Management of Chronic Issues    F/u Diabetes.      History of Present Illness:  Patient is in today for reassessment of chronic medical issues.  Ms. Klose has a history of type 2 diabetes. She is currently managed on glimepiride 1 mg bid and pioglitazone. After her last visit, her A1c remained above goal. I sent her a message about increasing her pioglitazone to 30 mg daily. She is unsure if she has done this yet. She does notes a recent episode of a low blood sugar (in the 50s). She found this surprising, as she had eaten earlier that day. She ate a pice of candy in response.  Ms. Hopkins notes that her brother is currently on peritoneal dialysis. He is 74 years old. He inquired about a kidney transplant. Apparently, he was told that because of his age, he would not be a candidate for an anonymous donor kidney. They would consider a kidney trasnplant if he had a family member that could donate. She asks about whether she might be able to be considered.  Ms. Shaker notes her other brother has end-stage leukemia and is being transitioned on to Hospice.  Past Medical History: Patient Active Problem List   Diagnosis Date Noted   Chronic kidney disease, stage 3a (HCC) 01/27/2023   H/O vitrectomy 07/02/2022   Epiretinal membrane, right eye 06/02/2022   Macular pucker, right eye 05/28/2022   HSV-2 (herpes simplex virus 2) infection 04/09/2022   Chronic rhinitis 04/09/2022   Posttraumatic stress disorder 01/07/2022   Arthritis of hand 11/21/2021   History of premalignant rectal tumor 11/13/2021   Degenerative lumbar spinal stenosis 03/03/2021   Lumbar radiculopathy  01/22/2021   Osteoarthritis of both hands 11/10/2020   Carpal tunnel syndrome of left wrist 04/11/2020   Gastroesophageal reflux disease 11/27/2019   Atypical chest pain 11/07/2018   BMI 29.0-29.9,adult 11/07/2018   Dyspnea 11/07/2018   Dupuytren's contracture of both hands 05/30/2018   Constipation 06/08/2016   Decreased visual acuity 06/08/2016   Type 2 diabetes mellitus with retinopathy (HCC) 08/11/2013   Osteopenia 08/19/2012   Hyperlipidemia 06/27/2008   History of colonic polyps 06/27/2008   Recurrent major depressive disorder (HCC) 01/19/2007   Past Surgical History:  Procedure Laterality Date   CARPAL TUNNEL RELEASE Right 05/2020   CATARACT EXTRACTION, BILATERAL Bilateral    cataracts   CERVICAL CONE BIOPSY     COLONOSCOPY  2003   COLONOSCOPY  04/01/2011   [place] - repeat in 5 years   COLONOSCOPY W/ POLYPECTOMY  2007   Dr Kinnie Scales   EXCISION MORTON'S NEUROMA Right    EYE SURGERY     07/02/22, 05/31/22   LAMINECTOMY     1982   LUMBAR LAMINECTOMY Left 11/2022   L4-L5   OOPHORECTOMY Left 12/2011   Dr Nicholas Lose ; ovarian cyst   POLYPECTOMY     RECTAL SURGERY  05/28/2006   pre-canerous lesion removed in 2007; Dr Byrd Hesselbach, [place]   TUBAL LIGATION     Family History  Problem Relation Age of Onset   Diabetes Mother    Dementia Mother    Prostate cancer Father    COPD Father  Heart disease Father        CAD - used nitroglycerin tablets   Diabetes Sister        obese   Thyroid cancer Sister    Cancer Brother        non Hodgkin's Lymphoma, leukemia   Diabetes Brother        not obese   Kidney disease Brother        Kidney failure   Diabetes Brother    Kidney failure Brother    Diabetes Maternal Aunt        X5 ; all IDDM   Heart disease Maternal Aunt        "enlarged heart"   Heart disease Maternal Uncle        "enlarged heart"   Diabetes Maternal Grandmother        IDDM   Diabetes Paternal Grandmother        IDDM   Heart disease Paternal Grandmother    Heart  disease Paternal Grandfather        CAD - used nitroglycerin tablets   Colon cancer Neg Hx    Esophageal cancer Neg Hx    Stomach cancer Neg Hx    Stroke Neg Hx    Colon polyps Neg Hx    Rectal cancer Neg Hx    Outpatient Medications Prior to Visit  Medication Sig Dispense Refill   Calcium 200 MG TABS Take by mouth. Tale [phone] mg daily     cetirizine (ZYRTEC) 10 MG tablet Take by mouth.     Cholecalciferol (VITAMIN D) 2000 units CAPS daily.     estradiol (ESTRACE) 0.1 MG/GM vaginal cream Place 1 g vaginally 2 (two) times a week.     famotidine (PEPCID) 40 MG tablet TAKE ONE TABLET BY MOUTH DAILY AT BEDTIME 90 tablet 0   glimepiride (AMARYL) 1 MG tablet Take 1 tablet (1 mg total) by mouth in the morning and at bedtime. 180 tablet 3   Glucosamine-Chondroitin 1500-1200 MG/30ML LIQD daily.     glucose blood (ONETOUCH VERIO) test strip Use as instructed 300 strip 12   lamoTRIgine (LAMICTAL) 200 MG tablet Take 200 mg by mouth daily.     lidocaine (XYLOCAINE) 5 % ointment Apply 1 application. topically as needed. 30 g 0   metroNIDAZOLE (FLAGYL) 500 MG tablet Take 1 tablet (500 mg total) by mouth 2 (two) times daily. Take for one week. 14 tablet 0   Multiple Vitamin (MULTI VITAMIN) TABS daily.     naproxen (NAPROSYN) 500 MG tablet Take by mouth.     omega-3 acid ethyl esters (LOVAZA) 1 g capsule Take by mouth daily.     OneTouch Delica Lancets 33G MISC Use to test blood sugar 3X daily.  Dx Code: E11.65 300 each 1   pioglitazone (ACTOS) 30 MG tablet Take 1 tablet (30 mg total) by mouth daily. 90 tablet 2   rosuvastatin (CRESTOR) 5 MG tablet Take 1 tablet (5 mg total) by mouth daily. 90 tablet 3   No facility-administered medications prior to visit.   Allergies  Allergen Reactions   Cortisone Palpitations, Other (See Comments) and Nausea And Vomiting    hyperactivity hyperactivity   Short Ragweed Pollen Ext Itching   Grass Pollen(K-O-R-T-Swt Vern) Rash   Other Other (See Comments),  Hives, Itching and Nausea Only    Other reaction(s): Headache   Pseudoephedrine Other (See Comments)    tachycardia tachycardia   Atorvastatin Other (See Comments)    Low dose   Codeine  Fluoxetine Hcl Other (See Comments)   Metformin Other (See Comments)    04/15/15 caused dizziness   Metformin And Related     04/15/15 caused dizziness   Pramipexole     nightmares   Shellfish Allergy Other (See Comments)    [other]   Objective:   Today's Vitals   01/27/23 0823  BP: 124/70  Pulse: (!) 56  Temp: 98.2 F (36.8 C)  TempSrc: Temporal  SpO2: 97%  Weight: 177 lb 3.2 oz (80.4 kg)  Height: 5' 3.5" (1.613 m)   Body mass index is 30.9 kg/m.   General: Well developed, well nourished. No acute distress. Psych: Alert and oriented. Normal mood and affect.  Health Maintenance Due  Topic Date Due   Medicare Annual Wellness (AWV)  02/19/2023   Lab Results: Lab Results  Component Value Date   HGBA1C 7.5 (H) 12/23/2022      Latest Ref Rng & Units 12/23/2022    8:57 AM 07/16/2022    9:31 AM 04/09/2022    8:52 AM  CMP  Glucose 70 - 99 mg/dL 829  562  130   BUN 6 - 23 mg/dL   22   Creatinine 8.65 - 1.20 mg/dL   7.84   Sodium 696 - 295 mEq/L   140   Potassium 3.5 - 5.1 mEq/L   3.9   Chloride 96 - 112 mEq/L   104   CO2 19 - 32 mEq/L   29   Calcium 8.4 - 10.5 mg/dL   9.3      Assessment & Plan:   Problem List Items Addressed This Visit       Endocrine   Type 2 diabetes mellitus with retinopathy (HCC) - Primary    I reinforced that Ms. Fabien should be on glimepiride 1 mg bid and pioglitazone 30 mg daily. I asked her to check her medication at home to assure she is taking the correct doses. We did review symptoms and proper response to hypoglycemia. Plan to recheck A1c in 2 months.      Relevant Medications   OneTouch Delica Lancets 33G MISC     Genitourinary   Chronic kidney disease, stage 3a (HCC)    Ms. Kovalenko's renal labs have been consistent with Stage 3a CKD over the  past 2 years. Although this is stable and she has no proteinuria, I would not advise her to pursue donating a kidney, as she would be at risk for diabetic nephropathy and progressive kidney failure. We will continue to monitor her blood pressure.       Return for Follow-up as scheduled.   Loyola Mast, MD

## 2023-01-27 NOTE — Assessment & Plan Note (Signed)
I reinforced that Destiny Romero should be on glimepiride 1 mg bid and pioglitazone 30 mg daily. I asked her to check her medication at home to assure she is taking the correct doses. We did review symptoms and proper response to hypoglycemia. Plan to recheck A1c in 2 months.

## 2023-01-27 NOTE — Patient Instructions (Signed)
Take glimepiride 1 mg, 1 tablet twice a day Take pioglitazone 30 mg, 1 tablet daily

## 2023-01-27 NOTE — Assessment & Plan Note (Signed)
Destiny Romero renal labs have been consistent with Stage 3a CKD over the past 2 years. Although this is stable and she has no proteinuria, I would not advise her to pursue donating a kidney, as she would be at risk for diabetic nephropathy and progressive kidney failure. We will continue to monitor her blood pressure.

## 2023-01-28 ENCOUNTER — Telehealth: Payer: Self-pay

## 2023-01-28 NOTE — Telephone Encounter (Signed)
Spoke to pt via phone call about pap result. Recall placed for 36 mos. Confirmed EMB/SHGM appt 03/16/23 at 3:30 with pt.

## 2023-02-01 ENCOUNTER — Encounter: Payer: Self-pay | Admitting: Family Medicine

## 2023-02-04 ENCOUNTER — Other Ambulatory Visit: Payer: Self-pay | Admitting: Family Medicine

## 2023-02-04 DIAGNOSIS — K219 Gastro-esophageal reflux disease without esophagitis: Secondary | ICD-10-CM

## 2023-02-05 DIAGNOSIS — H35371 Puckering of macula, right eye: Secondary | ICD-10-CM | POA: Diagnosis not present

## 2023-02-05 DIAGNOSIS — E119 Type 2 diabetes mellitus without complications: Secondary | ICD-10-CM | POA: Diagnosis not present

## 2023-02-10 ENCOUNTER — Ambulatory Visit (INDEPENDENT_AMBULATORY_CARE_PROVIDER_SITE_OTHER): Payer: Medicare HMO | Admitting: Family Medicine

## 2023-02-10 VITALS — BP 126/70 | HR 68 | Temp 98.2°F | Ht 63.5 in | Wt 176.4 lb

## 2023-02-10 DIAGNOSIS — N1831 Chronic kidney disease, stage 3a: Secondary | ICD-10-CM | POA: Diagnosis not present

## 2023-02-10 DIAGNOSIS — E11319 Type 2 diabetes mellitus with unspecified diabetic retinopathy without macular edema: Secondary | ICD-10-CM

## 2023-02-10 DIAGNOSIS — Z7985 Long-term (current) use of injectable non-insulin antidiabetic drugs: Secondary | ICD-10-CM | POA: Diagnosis not present

## 2023-02-10 DIAGNOSIS — Z7984 Long term (current) use of oral hypoglycemic drugs: Secondary | ICD-10-CM

## 2023-02-10 NOTE — Progress Notes (Signed)
[facility] [address] [place] [address] Dept: [phone] Dept Fax: [phone]  Office Visit  Subjective:    Patient ID: Destiny Romero, female    DOB: 27-Apr-1951, 72 y.o..   MRN: [id number]  Chief Complaint  Patient presents with   Medical Management of Chronic Issues    F/u to discuss DM/CKD.     History of Present Illness:  Patient is in today with questions about implications of her chronic kidney disease and how to best take care of herself.  She notes that she feels like she has had a lot to deal with in the last year and it starts to feel overwhelming.  Past Medical History: Patient Active Problem List   Diagnosis Date Noted   Chronic kidney disease, stage 3a (HCC) 01/27/2023   H/O vitrectomy 07/02/2022   Epiretinal membrane, right eye 06/02/2022   Macular pucker, right eye 05/28/2022   HSV-2 (herpes simplex virus 2) infection 04/09/2022   Chronic rhinitis 04/09/2022   Posttraumatic stress disorder 01/07/2022   Arthritis of hand 11/21/2021   History of premalignant rectal tumor 11/13/2021   Degenerative lumbar spinal stenosis 03/03/2021   Lumbar radiculopathy 01/22/2021   Osteoarthritis of both hands 11/10/2020   Carpal tunnel syndrome of left wrist 04/11/2020   Gastroesophageal reflux disease 11/27/2019   Atypical chest pain 11/07/2018   BMI 29.0-29.9,adult 11/07/2018   Dyspnea 11/07/2018   Dupuytren's contracture of both hands 05/30/2018   Constipation 06/08/2016   Decreased visual acuity 06/08/2016   Type 2 diabetes mellitus with retinopathy (HCC) 08/11/2013   Osteopenia 08/19/2012   Hyperlipidemia 06/27/2008   History of colonic polyps 06/27/2008   Recurrent major depressive disorder (HCC) 01/19/2007   Past Surgical History:  Procedure Laterality Date   CARPAL TUNNEL RELEASE Right 05/2020   CATARACT EXTRACTION, BILATERAL Bilateral    cataracts   CERVICAL CONE BIOPSY     COLONOSCOPY  2003    COLONOSCOPY  04/01/2011   [place] - repeat in 5 years   COLONOSCOPY W/ POLYPECTOMY  2007   Dr Kinnie Scales   EXCISION MORTON'S NEUROMA Right    EYE SURGERY     07/02/22, 05/31/22   LAMINECTOMY     1982   LUMBAR LAMINECTOMY Left 11/2022   L4-L5   OOPHORECTOMY Left 12/2011   Dr Nicholas Lose ; ovarian cyst   POLYPECTOMY     RECTAL SURGERY  05/28/2006   pre-canerous lesion removed in 2007; Dr Byrd Hesselbach, [place]   TUBAL LIGATION     Family History  Problem Relation Age of Onset   Diabetes Mother    Dementia Mother    Prostate cancer Father    COPD Father    Heart disease Father        CAD - used nitroglycerin tablets   Diabetes Sister        obese   Thyroid cancer Sister    Cancer Brother        non Hodgkin's Lymphoma, leukemia   Diabetes Brother        not obese   Kidney disease Brother        Kidney failure   Diabetes Brother    Kidney failure Brother    Diabetes Maternal Aunt        X5 ; all IDDM   Heart disease Maternal Aunt        "enlarged heart"   Heart disease Maternal Uncle        "enlarged heart"   Diabetes Maternal  Grandmother        IDDM   Diabetes Paternal Grandmother        IDDM   Heart disease Paternal Grandmother    Heart disease Paternal Grandfather        CAD - used nitroglycerin tablets   Colon cancer Neg Hx    Esophageal cancer Neg Hx    Stomach cancer Neg Hx    Stroke Neg Hx    Colon polyps Neg Hx    Rectal cancer Neg Hx    Outpatient Medications Prior to Visit  Medication Sig Dispense Refill   Calcium 200 MG TABS Take by mouth. Tale [phone] mg daily     cetirizine (ZYRTEC) 10 MG tablet Take by mouth.     Cholecalciferol (VITAMIN D) 2000 units CAPS daily.     estradiol (ESTRACE) 0.1 MG/GM vaginal cream Place 1 g vaginally 2 (two) times a week.     famotidine (PEPCID) 40 MG tablet TAKE 1 TABLET BY MOUTH EVERYDAY AT BEDTIME 90 tablet 3   glimepiride (AMARYL) 1 MG tablet Take 1 tablet (1 mg total) by mouth in the morning and at bedtime. 180 tablet 3    Glucosamine-Chondroitin 1500-1200 MG/30ML LIQD daily.     glucose blood (ONETOUCH VERIO) test strip Use as instructed 300 strip 12   lamoTRIgine (LAMICTAL) 200 MG tablet Take 200 mg by mouth daily.     lidocaine (XYLOCAINE) 5 % ointment Apply 1 application. topically as needed. 30 g 0   Multiple Vitamin (MULTI VITAMIN) TABS daily.     omega-3 acid ethyl esters (LOVAZA) 1 g capsule Take by mouth daily.     OneTouch Delica Lancets 33G MISC Use to test blood sugar 3X daily.  Dx Code: E11.65 300 each 1   pioglitazone (ACTOS) 30 MG tablet Take 1 tablet (30 mg total) by mouth daily. 90 tablet 2   rosuvastatin (CRESTOR) 5 MG tablet Take 1 tablet (5 mg total) by mouth daily. 90 tablet 3   metroNIDAZOLE (FLAGYL) 500 MG tablet Take 1 tablet (500 mg total) by mouth 2 (two) times daily. Take for one week. 14 tablet 0   naproxen (NAPROSYN) 500 MG tablet Take by mouth.     No facility-administered medications prior to visit.   Allergies  Allergen Reactions   Cortisone Palpitations, Other (See Comments) and Nausea And Vomiting    hyperactivity hyperactivity   Short Ragweed Pollen Ext Itching   Grass Pollen(K-O-R-T-Swt Vern) Rash   Other Other (See Comments), Hives, Itching and Nausea Only    Other reaction(s): Headache   Pseudoephedrine Other (See Comments)    tachycardia tachycardia   Atorvastatin Other (See Comments)    Low dose   Codeine    Fluoxetine Hcl Other (See Comments)   Metformin Other (See Comments)    04/15/15 caused dizziness   Metformin And Related     04/15/15 caused dizziness   Pramipexole     nightmares   Shellfish Allergy Other (See Comments)    [other]     Objective:   Today's Vitals   02/10/23 1409  BP: 126/70  Pulse: 68  Temp: 98.2 F (36.8 C)  TempSrc: Temporal  SpO2: 97%  Weight: 176 lb 6.4 oz (80 kg)  Height: 5' 3.5" (1.613 m)   Body mass index is 30.76 kg/m.   General: Well developed, well nourished. No acute distress. Psych: Alert and oriented. Normal  Romero and affect.  Health Maintenance Due  Topic Date Due   DTaP/Tdap/Td (2 - Td or  Tdap) 02/04/2023   Medicare Annual Wellness (AWV)  02/19/2023   Lab Results Component Ref Range & Units 10 mo ago (04/09/22) 10 mo ago (03/31/22) 1 yr ago (01/07/22) 1 yr ago (11/19/21) 1 yr ago (08/27/21) 1 yr ago (03/20/21) 2 yr ago (12/12/20)  BUN 6 - 23 mg/dL 22 25 R  22  21 19   Creatinine, Ser 0.40 - 1.20 mg/dL 1.61 0.96 High  R  0.45  1.08 1.12  GFR >60.00 mL/min 53.64 Low    47.67 Low  CM  51.64 Low  CM 49.53 Low      Assessment & Plan:  1. Chronic kidney disease, stage 3a (HCC) 2. Type 2 diabetes mellitus with right eye affected by retinopathy without macular edema, without long-term current use of insulin, unspecified retinopathy severity (HCC)  Ms. Stetzer's labs have shown stable Stage 3 a CKD over the past 9 years. She was not aware that she had this as an until I raised this during a recent visit. She has a brother currently on dialysis, seeking a renal transplant, which has increased her concern. I reviewed how CKD is diagnosed and staged, risk factors for development, approaches to management, and prognosis. Her kidney issues likely relate to her Type 2 diabetes and history of NSAID use. I advised her against NSAID use in the future. We need to continue to focus on her diabetes management, with a goal A1c < 7.0%. We will continue glimeperide 1 mg bid and pioglitazone 30 mg daily. If her next A1c is not to goal, would consider adding an SGLT2i. I will refer her to nutrition to review dietary measures for managing her diabetes, but also reducing risk for progression of chronic kidney disease.  - Amb Referral to Nutrition and Diabetic Education  Return for Follow-up as scheduled.   Loyola Mast, MD

## 2023-02-11 ENCOUNTER — Encounter: Payer: Self-pay | Admitting: Dietician

## 2023-02-11 ENCOUNTER — Ambulatory Visit: Payer: Medicare HMO | Admitting: Nurse Practitioner

## 2023-02-11 ENCOUNTER — Encounter: Payer: Medicare HMO | Attending: Family Medicine | Admitting: Dietician

## 2023-02-11 VITALS — Ht 63.5 in | Wt 176.0 lb

## 2023-02-11 DIAGNOSIS — E11319 Type 2 diabetes mellitus with unspecified diabetic retinopathy without macular edema: Secondary | ICD-10-CM | POA: Insufficient documentation

## 2023-02-11 NOTE — Patient Instructions (Addendum)
Plan:  Aim for 2-3 Carb Choices per meal (30-45 grams) +/- 1 either way  Aim for 0-1 Carbs per snack if hungry  Include protein in moderation with your meals and snacks Consider reading food labels for Total Carbohydrate of foods Consider  increasing your activity level by walking for 30 minutes daily as tolerated Consider checking BG at alternate times per day  Consider taking medication as directed by MD  Be sure to eat consistent meals due to your medication.    Continue to stay active.  Walking is fine.  Ask your doctor about other exercise that could be an option based on your back surgery. Continue no dark soda. Calorie [employer]:  Consistently scheduled meal - avoid skipping  Choices  Eat slowly  Away from distraction (sitting in kitchen or dining room)  Stop eating when satisfied  Before a snack ask, "Am I hungry or eating for another reason?"   "What can I do instead if I am not hungry?"  Try to find something every day that brings you joy!

## 2023-02-11 NOTE — Progress Notes (Signed)
Diabetes Self-Management Education  Visit Type: First/Initial  Appt. Start Time: 0915 Appt. End Time: 1030  02/16/2023  Ms. Destiny Romero, identified by name and date of birth, is a 72 y.o. female with a diagnosis of Diabetes: Type 2.   ASSESSMENT Patient is here today alone. She would like to learn how to eat to protect her kidneys and better control her blood glucose. Her brother who does not have diabetes is now on dialysis.  Referral Diagnosis:  CKD stage 3a and Type 2 diabetes with retinopathy History includes:  Type 2 Diabetes, CKD stage 3a, HLD, anal cancer, depression, ostoporosis Medications includes:  Glimepiride, pioglitazone, rosuvastatin, omega 3, calcium, MVI, vitamin D, turmeric Labs noted to include:  A1C 7.5% 12/23/2022 decreased from 7.7% 07/16/2022 BUN 22, Creatinine 1.04, Potassium 3.9, GFR 53 on 8/32/2023, urine protein negative 01/18/2023 Vitamin D 43, Vitamin B-12 684 on 03/31/2022 Lipid Panel     Component Value Date/Time   CHOL 163 11/19/2021 0811   CHOL 252 (H) 06/14/2014 1009   TRIG 93.0 11/19/2021 0811   TRIG 75 06/14/2014 1009   HDL 65.50 11/19/2021 0811   HDL 88 06/14/2014 1009   CHOLHDL 2 11/19/2021 0811   VLDL 18.6 11/19/2021 0811   LDLCALC 78 11/19/2021 0811   LDLCALC 149 (H) 06/14/2014 1009   LDLDIRECT 146.5 08/11/2013 0854   Weight hx: 63.5" 176 lbs 02/11/2023 165 lbs 02/2022 2023 - "fed my stress"  Patient lives alone.  Dislikes cooking. 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   She is retired.  Network engineer. Patient had back surgery in March, 2 eye surgeries, brother diagnosis with leukemia, increased stress over the past year. Enjoys sewing and piano. Currently checking her blood glucose >4 times daily. Now reading labels for sodium.     States that her joints feel better off gluten. Height 5' 3.5" (1.613 m), weight 176 lb (79.8 kg), last menstrual period 04/07/2001. Body  mass index is 30.69 kg/m.   Diabetes Self-Management Education - 02/11/23 0934       Visit Information   Visit Type First/Initial      Initial Visit   Diabetes Type Type 2    Are you currently following a meal plan? No    Are you taking your medications as prescribed? Yes      Health Coping   How would you rate your overall health? Good      Psychosocial Assessment   Patient Belief/Attitude about Diabetes Motivated to manage diabetes    What is the hardest part about your diabetes right now, causing you the most concern, or is the most worrisome to you about your diabetes?   Making healty food and beverage choices    Self-care barriers None    Self-management support Doctor's office    Other persons present Patient    Patient Concerns Nutrition/Meal planning;Glycemic Control;Problem Solving;Healthy Lifestyle    Special Needs None    Preferred Learning Style No preference indicated    Learning Readiness Ready    How often do you need to have someone help you when you read instructions, pamphlets, or other written materials from your doctor or pharmacy? 1 - Never    What is the last grade level you completed in school? 4 years college      Pre-Education Assessment   Patient understands the diabetes disease and treatment process. Needs Instruction    Patient understands incorporating nutritional management into lifestyle. Needs Instruction  Patient undertands incorporating physical activity into lifestyle. Needs Instruction    Patient understands using medications safely. Needs Instruction    Patient understands monitoring blood glucose, interpreting and using results Needs Instruction    Patient understands prevention, detection, and treatment of acute complications. Needs Instruction    Patient understands prevention, detection, and treatment of chronic complications. Needs Instruction    Patient understands how to develop strategies to address psychosocial issues. Needs  Instruction    Patient understands how to develop strategies to promote health/change behavior. Needs Instruction      Complications   Last HgB A1C per patient/outside source 7.5 %   12/23/2022 decreased from 07/2022   How often do you check your blood sugar? > 4 times/day    Fasting Blood glucose range (mg/dL) <16;10-960;454-098   less than 150   Postprandial Blood glucose range (mg/dL) <11;914-782;956-213;08-657    Number of hypoglycemic episodes per month 2    Can you tell when your blood sugar is low? Yes    What do you do if your blood sugar is low? candy    Have you had a dilated eye exam in the past 12 months? Yes    Have you had a dental exam in the past 12 months? Yes    Are you checking your feet? No      Dietary Intake   Breakfast cheese, unsalted saltine crackers, fruit OR boiled egg, 1/2 orange OR scrambled egg, 1 bacon or sausage, toast    Lunch salad with chicke peas, avocado, oil and vinegar, garlic    Snack (afternoon) berries, [employer]kshake    Dinner rotissorie chicken, fruit   10 am   Beverage(s) 2-3 pints water, 1-2 cups hot tea      Activity / Exercise   Activity / Exercise Type Light (walking / raking leaves)    How many days per week do you exercise? 6    How many minutes per day do you exercise? 40    Total minutes per week of exercise 240      Patient Education   Previous Diabetes Education No    Disease Pathophysiology Definition of diabetes, type 1 and 2, and the diagnosis of diabetes    Healthy Eating Role of diet in the treatment of diabetes and the relationship between the three main macronutrients and blood glucose level;Food label reading, portion sizes and measuring food.;Plate Method;Meal options for control of blood glucose level and chronic complications.    Being Active Role of exercise on diabetes management, blood pressure control and cardiac health.    Medications Reviewed patients medication for diabetes, action, purpose, timing of dose  and side effects.    Monitoring Taught/discussed recording of test results and interpretation of SMBG.;Identified appropriate SMBG and/or A1C goals.;Daily foot exams;Yearly dilated eye exam    Acute complications Taught prevention, symptoms, and  treatment of hypoglycemia - the 15 rule.;Discussed and identified patients' prevention, symptoms, and treatment of hyperglycemia.    Chronic complications Relationship between chronic complications and blood glucose control    Diabetes Stress and Support Identified and addressed patients feelings and concerns about diabetes;Worked with patient to identify barriers to care and solutions;Role of stress on diabetes    Lifestyle and Health Coping Lifestyle issues that need to be addressed for better diabetes care      Individualized Goals (developed by patient)   Nutrition General guidelines for healthy choices and portions discussed    Physical Activity Exercise 5-7 days per week;30 minutes per  day    Medications take my medication as prescribed    Monitoring  Test my blood glucose as discussed    Problem Solving Medication consistency;Eating Pattern;Addressing barriers to behavior change    Reducing Risk examine blood glucose patterns;do foot checks daily;treat hypoglycemia with 15 grams of carbs if blood glucose less than 70mg /dL      Post-Education Assessment   Patient understands the diabetes disease and treatment process. Demonstrates understanding / competency    Patient understands incorporating nutritional management into lifestyle. Needs Review    Patient undertands incorporating physical activity into lifestyle. Comprehends key points    Patient understands using medications safely. Comphrehends key points    Patient understands monitoring blood glucose, interpreting and using results Comprehends key points    Patient understands prevention, detection, and treatment of acute complications. Comprehends key points    Patient understands  prevention, detection, and treatment of chronic complications. Comprehends key points    Patient understands how to develop strategies to address psychosocial issues. Comprehends key points    Patient understands how to develop strategies to promote health/change behavior. Needs Review      Outcomes   Expected Outcomes Demonstrated interest in learning. Expect positive outcomes    Future DMSE 3-4 months    Program Status Not Completed             Individualized Plan for Diabetes Self-Management Training:   Learning Objective:  Patient will have a greater understanding of diabetes self-management. Patient education plan is to attend individual and/or group sessions per assessed needs and concerns.   Plan:   Patient Instructions  Plan:  Aim for 2-3 Carb Choices per meal (30-45 grams) +/- 1 either way  Aim for 0-1 Carbs per snack if hungry  Include protein in moderation with your meals and snacks Consider reading food labels for Total Carbohydrate of foods Consider  increasing your activity level by walking for 30 minutes daily as tolerated Consider checking BG at alternate times per day  Consider taking medication as directed by MD  Be sure to eat consistent meals due to your medication.    Continue to stay active.  Walking is fine.  Ask your doctor about other exercise that could be an option based on your back surgery. Continue no dark soda. Calorie [employer]:  Consistently scheduled meal - avoid skipping  Choices  Eat slowly  Away from distraction (sitting in kitchen or dining room)  Stop eating when satisfied  Before a snack ask, "Am I hungry or eating for another reason?"   "What can I do instead if I am not hungry?"  Try to find something every day that brings you joy!    Expected Outcomes:  Demonstrated interest in learning. Expect positive outcomes  Education material provided: ADA - How to Thrive: A Guide for Your Journey with Diabetes, Meal  plan card, Snack sheet, and Diabetes Resources, [employer] [facility] [employer]estyle [facility]) packet  If problems or questions, patient to contact team via:  Phone  Future DSME appointment: 3-4 months

## 2023-02-16 ENCOUNTER — Encounter: Payer: Self-pay | Admitting: Family Medicine

## 2023-02-26 ENCOUNTER — Ambulatory Visit (INDEPENDENT_AMBULATORY_CARE_PROVIDER_SITE_OTHER): Payer: Medicare HMO

## 2023-02-26 ENCOUNTER — Ambulatory Visit (INDEPENDENT_AMBULATORY_CARE_PROVIDER_SITE_OTHER): Payer: Medicare HMO | Admitting: Family Medicine

## 2023-02-26 ENCOUNTER — Encounter: Payer: Self-pay | Admitting: Family Medicine

## 2023-02-26 VITALS — Ht 63.5 in | Wt 173.0 lb

## 2023-02-26 VITALS — BP 138/80 | HR 69 | Temp 98.6°F | Ht 63.5 in | Wt 174.2 lb

## 2023-02-26 DIAGNOSIS — Z0000 Encounter for general adult medical examination without abnormal findings: Secondary | ICD-10-CM

## 2023-02-26 DIAGNOSIS — J069 Acute upper respiratory infection, unspecified: Secondary | ICD-10-CM | POA: Diagnosis not present

## 2023-02-26 MED ORDER — HYDROCODONE BIT-HOMATROP MBR 5-1.5 MG/5ML PO SOLN
5.0000 mL | Freq: Three times a day (TID) | ORAL | 0 refills | Status: DC | PRN
Start: 2023-02-26 — End: 2023-03-01

## 2023-02-26 MED ORDER — AZELASTINE HCL 0.1 % NA SOLN
1.0000 | Freq: Two times a day (BID) | NASAL | 12 refills | Status: AC
Start: 2023-02-26 — End: ?

## 2023-02-26 NOTE — Progress Notes (Signed)
Subjective:   Destiny Romero is a 72 y.o. female who presents for Medicare Annual (Subsequent) preventive examination.  Visit Complete: Virtual  I connected with  Malachy Mood on 02/26/23 by a audio enabled telemedicine application and verified that I am speaking with the correct person using two identifiers.  Patient Location: Home  Provider Location: Office/Clinic  I discussed the limitations of evaluation and management by telemedicine. The patient expressed understanding and agreed to proceed.  Patient Medicare AWV questionnaire was completed by the patient on 02/25/2023; I have confirmed that all information answered by patient is correct and no changes since this date.  Review of Systems     Cardiac Risk Factors include: advanced age (>80men, >31 women);diabetes mellitus;dyslipidemia;obesity (BMI >30kg/m2)     Objective:    Today's Vitals   02/26/23 0811 02/26/23 0812  Weight: 173 lb (78.5 kg)   Height: 5' 3.5" (1.613 m)   PainSc:  3    Body mass index is 30.16 kg/m.     02/26/2023    8:20 AM 02/11/2023    9:22 AM 02/18/2022    9:13 AM 03/21/2020    8:06 AM 03/21/2019    8:28 AM 03/15/2018    8:17 AM 12/21/2016   11:45 AM  Advanced Directives  Does Patient Have a Medical Advance Directive? Yes Yes Yes Yes Yes Yes Yes  Type of Estate agent of [place];Living will  Healthcare Power of [place];Living will Healthcare Power of [place];Living will Healthcare Power of [place];Living will Healthcare Power of [place];Living will Healthcare Power of [place];Living will  Does patient want to make changes to medical advance directive?    No - Patient declined No - Patient declined    Copy of Healthcare Power of Attorney in Chart? No - copy requested  No - copy requested No - copy requested No - copy requested No - copy requested No - copy requested    Current Medications (verified) Outpatient Encounter Medications as of 02/26/2023  Medication Sig    Calcium 200 MG TABS Take by mouth. Tale [phone] mg daily   cetirizine (ZYRTEC) 10 MG tablet Take by mouth.   Cholecalciferol (VITAMIN D) 2000 units CAPS daily.   estradiol (ESTRACE) 0.1 MG/GM vaginal cream Place 1 g vaginally 2 (two) times a week.   famotidine (PEPCID) 40 MG tablet TAKE 1 TABLET BY MOUTH EVERYDAY AT BEDTIME   glimepiride (AMARYL) 1 MG tablet Take 1 tablet (1 mg total) by mouth in the morning and at bedtime.   Glucosamine-Chondroitin 1500-1200 MG/30ML LIQD daily.   glucose blood (ONETOUCH VERIO) test strip Use as instructed   lamoTRIgine (LAMICTAL) 200 MG tablet Take 200 mg by mouth daily.   lidocaine (XYLOCAINE) 5 % ointment Apply 1 application. topically as needed.   Multiple Vitamin (MULTI VITAMIN) TABS daily.   omega-3 acid ethyl esters (LOVAZA) 1 g capsule Take by mouth daily.   OneTouch Delica Lancets 33G MISC Use to test blood sugar 3X daily.  Dx Code: E11.65   pioglitazone (ACTOS) 30 MG tablet Take 1 tablet (30 mg total) by mouth daily.   rosuvastatin (CRESTOR) 5 MG tablet Take 1 tablet (5 mg total) by mouth daily.   TURMERIC PO Take by mouth.   No facility-administered encounter medications on file as of 02/26/2023.    Allergies (verified) Cortisone, Short ragweed pollen ext, Grass pollen([id number]-o-r-t-swt vern), Other, Pseudoephedrine, Atorvastatin, Codeine, Fluoxetine hcl, Metformin, Metformin and related, Pramipexole, and Shellfish allergy   History: Past Medical History:  Diagnosis Date  Allergy    Anal lesion 2007   Anemia    Anesthesia complication    Per pt/ sensitive to sedation!   Arthritis    fingers,toes   Cancer (HCC)    "PRECANCEROUS LESIONS IN RECTUM"   Cataract    bilateral,removed   Central retinal artery occlusion 06/08/2016   limited vision right eye.   Cervical dysplasia 04/1991   Dr Laurena Bering - [facility] I, [facility] and freeze in 1990s with normal paps since   Constipation 06/08/2016   moves bowels every 3 rd day    Decreased visual acuity  06/08/2016   Retinal bleed right eye 2017   Depression    Diabetes mellitus without complication (HCC)    Fatigue 06/27/2017   H/O measles    H/O mumps    History of chicken pox    Hyperlipidemia    Mitral valve prolapse    NO MR; no SBE prophylaxis needed   Osteoporosis    PONV (postoperative nausea and vomiting)    Pre-diabetes    highest A1c 6.4 %   Preventative health care 12/21/2016   Squamous cell carcinoma in situ of skin    Dr Hortense Ramal   STD (sexually transmitted disease)    HSV   Vision impairment    limited vision in right eye.   Past Surgical History:  Procedure Laterality Date   CARPAL TUNNEL RELEASE Right 05/2020   CATARACT EXTRACTION, BILATERAL Bilateral    cataracts   CERVICAL CONE BIOPSY     COLONOSCOPY  2003   COLONOSCOPY  04/01/2011   [place] - repeat in 5 years   COLONOSCOPY W/ POLYPECTOMY  2007   Dr Kinnie Scales   EXCISION MORTON'S NEUROMA Right    EYE SURGERY     07/02/22, 05/31/22   LAMINECTOMY     1982   LUMBAR LAMINECTOMY Left 11/2022   L4-L5   OOPHORECTOMY Left 12/2011   Dr Nicholas Lose ; ovarian cyst   POLYPECTOMY     RECTAL SURGERY  05/28/2006   pre-canerous lesion removed in 2007; Dr Byrd Hesselbach, Pollyann Savoy   TUBAL LIGATION     Family History  Problem Relation Age of Onset   Diabetes Mother    Dementia Mother    Prostate cancer Father    COPD Father    Heart disease Father        CAD - used nitroglycerin tablets   Diabetes Sister        obese   Thyroid cancer Sister    Cancer Brother        non Hodgkin's Lymphoma, leukemia   Diabetes Brother        not obese   Kidney disease Brother        Kidney failure   Diabetes Brother    Kidney failure Brother    Diabetes Maternal Aunt        X5 ; all IDDM   Heart disease Maternal Aunt        "enlarged heart"   Heart disease Maternal Uncle        "enlarged heart"   Diabetes Maternal Grandmother        IDDM   Diabetes Paternal Grandmother        IDDM   Heart disease Paternal Grandmother    Heart  disease Paternal Grandfather        CAD - used nitroglycerin tablets   Colon cancer Neg Hx    Esophageal cancer Neg Hx    Stomach cancer Neg Hx    Stroke  Neg Hx    Colon polyps Neg Hx    Rectal cancer Neg Hx    Social History   Socioeconomic History   Marital status: Single    Spouse name: Not on file   Number of children: 0   Years of education: Not on file   Highest education level: Bachelor's degree (e.g., BA, AB, BS)  Occupational History   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  Tobacco Use   Smoking status: Never   Smokeless tobacco: Never  Vaping Use   Vaping Use: Never used  Substance and Sexual Activity   Alcohol use: Yes    Comment: glass wine once a month   Drug use: No   Sexual activity: Yes    Partners: Male    Comment: 1st intercourse 72 yo-More than 5 partners  Other Topics Concern   Not on file  Social History Narrative   Lives alone   No dietary restrictions   Social Determinants of Health   Financial Resource Strain: Low Risk  (02/25/2023)   Overall Financial Resource Strain (CARDIA)    Difficulty of Paying Living Expenses: Not hard at all  Food Insecurity: No Food Insecurity (02/25/2023)   Hunger Vital Sign    Worried About Running Out of Food in the Last Year: Never true    Ran Out of Food in the Last Year: Never true  Transportation Needs: No Transportation Needs (02/25/2023)   PRAPARE - Administrator, Civil Service (Medical): No    Lack of Transportation (Non-Medical): No  Physical Activity: Insufficiently Active (02/25/2023)   Exercise Vital Sign    Days of Exercise per Week: 4 days    Minutes of Exercise per Session: 30 min  Stress: No Stress Concern Present (02/25/2023)   [employer] of Occupational Health - Occupational Stress Questionnaire    Feeling of Stress : Only a little  Recent Concern: Stress - Stress Concern Present (12/21/2022)   [employer] of Occupational Health - Occupational Stress Questionnaire     Feeling of Stress : To some extent  Social Connections: Moderately Integrated (02/25/2023)   Social Connection and Isolation Panel [NHANES]    Frequency of Communication with Friends and Family: Once a week    Frequency of Social Gatherings with Friends and Family: Twice a week    Attends Religious Services: More than 4 times per year    Active Member of [employer] or Organizations: Yes    Attends Engineer, structural: More than 4 times per year    Marital Status: Divorced    Tobacco Counseling Counseling given: Not Answered   Clinical Intake:  Pre-visit preparation completed: Yes  Pain : 0-10 Pain Score: 3  Pain Type: Acute pain Pain Location: Nose Pain Descriptors / Indicators: Burning Pain Onset: Yesterday Pain Frequency: Constant     Nutritional Status: BMI > 30  Obese Nutritional Risks: None Diabetes: Yes CBG done?: No Did pt. bring in CBG monitor from home?: No  How often do you need to have someone help you when you read instructions, pamphlets, or other written materials from your doctor or pharmacy?: 1 - Never  Interpreter Needed?: No  Information entered by :: NAllen LPN   Activities of Daily Living    02/25/2023    3:30 PM  In your present state of health, do you have any difficulty performing the following activities:  Hearing? 0  Vision? 0  Difficulty concentrating or making decisions? 0  Walking or climbing stairs? 0  Dressing or  bathing? 0  Doing errands, shopping? 0  Preparing Food and eating ? N  Using the Toilet? N  In the past six months, have you accidently leaked urine? Y  Do you have problems with loss of bowel control? N  Managing your Medications? N  Managing your Finances? N  Housekeeping or managing your Housekeeping? N    Patient Care Team: Loyola Mast, MD as PCP - General (Family Medicine) Carlus Pavlov, MD as Consulting Physician (Internal Medicine) Rachael Fee, MD as Consulting Physician  (Gastroenterology) Cherly Hensen, MD as Consulting Physician (Psychiatry) Venancio Poisson, MD as Consulting Physician (Dermatology) Sherrie George, MD as Consulting Physician (Ophthalmology) Romualdo Bolk, MD as Consulting Physician (Obstetrics and Gynecology) Mia Creek, MD as Consulting Physician (Ophthalmology) Jene Every, MD as Consulting Physician (Orthopedic Surgery) Bradly Bienenstock, MD as Consulting Physician (Orthopedic Surgery) Terance Hart, MD as Consulting Physician (Orthopedic Surgery) Sinda Du, MD as Consulting Physician (Ophthalmology)  Indicate any recent Medical Services you may have received from other than [facility] providers in the past year (date may be approximate).     Assessment:   This is a routine wellness examination for Destiny Romero.  Hearing/Vision screen Vision Screening - Comments:: In [place]  Dietary issues and exercise activities discussed:     Goals Addressed             This Visit's Progress    Patient Stated       02/26/2023, wants to get A1C and weight down. Would like to get kidneys healthier       Depression Screen    02/26/2023    8:21 AM 02/11/2023    9:21 AM 01/27/2023    9:09 AM 09/28/2022    2:18 PM 09/28/2022    1:42 PM 07/16/2022    9:42 AM 04/09/2022    8:48 AM  PHQ 2/9 Scores  PHQ - 2 Score 0 1 1 4 4 1 2   PHQ- 9 Score   5 15  4 6     Fall Risk    02/25/2023    3:30 PM 02/11/2023    9:21 AM 01/27/2023    8:30 AM 09/28/2022    1:41 PM 02/18/2022    9:14 AM  Fall Risk   Falls in the past year? 0 0 0 0 0  Number falls in past yr: 0  0 0 0  Injury with Fall? 0  0 0 0  Risk for fall due to : Medication side effect  No Fall Risks No Fall Risks   Follow up Falls prevention discussed;Education provided;Falls evaluation completed  Falls evaluation completed Falls evaluation completed Falls evaluation completed    MEDICARE RISK AT HOME:  Medicare Risk at Home - 02/25/23 1530     If so, are there any  without handrails? No             TIMED UP AND GO:  Was the test performed?  No    Cognitive Function:        02/26/2023    8:21 AM  6CIT Screen  What Year? 0 points  What month? 0 points  What time? 0 points  Count back from 20 0 points  Months in reverse 0 points  Repeat phrase 2 points  Total Score 2 points    Immunizations Immunization History  Administered Date(s) Administered   Fluad Quad(high Dose 65+) 05/11/2019, 08/15/2021   Hepatitis B 01/27/2013   Influenza Split 06/28/2012   Influenza, High Dose Seasonal PF 10/05/2016,  06/15/2017, 06/16/2018   Influenza,inj,Quad PF,6+ Mos 07/25/2013, 06/14/2014, 07/11/2015   Influenza-Unspecified 06/16/2018   PFIZER(Purple Top)SARS-COV-2 Vaccination 11/02/2019, 12/01/2019   Pneumococcal Conjugate-13 06/16/2018   Pneumococcal Polysaccharide-23 03/20/2021   Tdap 02/03/2013   Zoster Recombinat (Shingrix) 12/29/2016, 03/02/2017   Zoster, Live 04/22/2010    TDAP status: Due, Education has been provided regarding the importance of this vaccine. Advised may receive this vaccine at local pharmacy or Health Dept. Aware to provide a copy of the vaccination record if obtained from local pharmacy or Health Dept. Verbalized acceptance and understanding.  Flu Vaccine status: Up to date  Pneumococcal vaccine status: Up to date  Covid-19 vaccine status: Completed vaccines  Qualifies for Shingles Vaccine? Yes   Zostavax completed Yes   Shingrix Completed?: Yes  Screening Tests Health Maintenance  Topic Date Due   COVID-19 Vaccine (3 - 2023-24 season) 05/08/2022   DTaP/Tdap/Td (2 - Td or Tdap) 02/04/2023   Medicare Annual Wellness (AWV)  02/19/2023   INFLUENZA VACCINE  04/08/2023   Diabetic kidney evaluation - eGFR measurement  04/10/2023   Diabetic kidney evaluation - Urine ACR  04/10/2023   OPHTHALMOLOGY EXAM  05/29/2023   HEMOGLOBIN A1C  06/24/2023   MAMMOGRAM  08/12/2023   FOOT EXAM  12/23/2023   Colonoscopy   10/17/2026   Pneumonia Vaccine 64+ Years old  Completed   DEXA SCAN  Completed   Hepatitis C Screening  Completed   Zoster Vaccines- Shingrix  Completed   HPV VACCINES  Aged Out    Health Maintenance  Health Maintenance Due  Topic Date Due   COVID-19 Vaccine (3 - 2023-24 season) 05/08/2022   DTaP/Tdap/Td (2 - Td or Tdap) 02/04/2023   Medicare Annual Wellness (AWV)  02/19/2023    Colorectal cancer screening: Type of screening: Colonoscopy. Completed 10/17/2021. Repeat every 5 years  Mammogram status: Completed 08/11/2022. Repeat every year  Bone Density status: Completed 10/07/2021.  Lung Cancer Screening: (Low Dose CT Chest recommended if Age 67-80 years, 20 pack-year currently smoking OR have quit w/in 15years.) does not qualify.   Lung Cancer Screening Referral: no  Additional Screening:  Hepatitis C Screening: does qualify; Completed 01/19/2023  Vision Screening: Recommended annual ophthalmology exams for early detection of glaucoma and other disorders of the eye. Is the patient up to date with their annual eye exam?  Yes  Who is the provider or what is the name of the office in which the patient attends annual eye exams? In [place] If pt is not established with a provider, would they like to be referred to a provider to establish care? No .   Dental Screening: Recommended annual dental exams for proper oral hygiene  Diabetic Foot Exam: Diabetic Foot Exam: Completed 12/23/2022  Community Resource Referral / Chronic Care Management: CRR required this visit?  No   CCM required this visit?  No     Plan:     I have personally reviewed and noted the following in the patient's chart:   Medical and social history Use of alcohol, tobacco or illicit drugs  Current medications and supplements including opioid prescriptions. Patient is not currently taking opioid prescriptions. Functional ability and status Nutritional status Physical activity Advanced directives List  of other physicians Hospitalizations, surgeries, and ER visits in previous 12 months Vitals Screenings to include cognitive, depression, and falls Referrals and appointments  In addition, I have reviewed and discussed with patient certain preventive protocols, quality metrics, and best practice recommendations. A written personalized care plan for preventive services as  well as general preventive health recommendations were provided to patient.     Barb Merino, LPN   [id number]/56/[id number]   After Visit Summary: (MyChart) Due to this being a telephonic visit, the after visit summary with patients personalized plan was offered to patient via MyChart   Nurse Notes: none

## 2023-02-26 NOTE — Assessment & Plan Note (Signed)
Discussed home care for viral illness, including rest, pushing fluids, and OTC medications as needed for symptom relief. Recommend hot tea with honey for sore throat symptoms. I will prescribe some cough syrup to help her rest at night. Follow-up if needed for worsening or persistent symptoms.

## 2023-02-26 NOTE — Patient Instructions (Signed)
Destiny Romero , Thank you for taking time to come for your Medicare Wellness Visit. I appreciate your ongoing commitment to your health goals. Please review the following plan we discussed and let me know if I can assist you in the future.   These are the goals we discussed:  Goals       <enter goal here> (pt-stated)      Have less stress.      Patient Stated      02/26/2023, wants to get A1C and weight down. Would like to get kidneys healthier      Weight (lb) < 145 lb (65.8 kg)        This is a list of the screening recommended for you and due dates:  Health Maintenance  Topic Date Due   COVID-19 Vaccine (3 - 2023-24 season) 05/08/2022   DTaP/Tdap/Td vaccine (2 - Td or Tdap) 02/04/2023   Flu Shot  04/08/2023   Yearly kidney function blood test for diabetes  04/10/2023   Yearly kidney health urinalysis for diabetes  04/10/2023   Eye exam for diabetics  05/29/2023   Hemoglobin A1C  06/24/2023   Mammogram  08/12/2023   Complete foot exam   12/23/2023   Medicare Annual Wellness Visit  02/26/2024   Colon Cancer Screening  10/17/2026   Pneumonia Vaccine  Completed   DEXA scan (bone density measurement)  Completed   Hepatitis C Screening  Completed   Zoster (Shingles) Vaccine  Completed   HPV Vaccine  Aged Out    Advanced directives: Please bring a copy of your POA (Power of Novice) and/or Living Will to your next appointment.   Conditions/risks identified: none  Next appointment: Follow up in one year for your annual wellness visit    Preventive Care 65 Years and Older, Female Preventive care refers to lifestyle choices and visits with your health care provider that can promote health and wellness. What does preventive care include? A yearly physical exam. This is also called an annual well check. Dental exams once or twice a year. Routine eye exams. Ask your health care provider how often you should have your eyes checked. Personal lifestyle choices, including: Daily  care of your teeth and gums. Regular physical activity. Eating a healthy diet. Avoiding tobacco and drug use. Limiting alcohol use. Practicing safe sex. Taking low-dose aspirin every day. Taking vitamin and mineral supplements as recommended by your health care provider. What happens during an annual well check? The services and screenings done by your health care provider during your annual well check will depend on your age, overall health, lifestyle risk factors, and family history of disease. Counseling  Your health care provider may ask you questions about your: Alcohol use. Tobacco use. Drug use. Emotional well-being. Home and relationship well-being. Sexual activity. Eating habits. History of falls. Memory and ability to understand (cognition). Work and work Astronomer. Reproductive health. Screening  You may have the following tests or measurements: Height, weight, and BMI. Blood pressure. Lipid and cholesterol levels. These may be checked every 5 years, or more frequently if you are over 27 years old. Skin check. Lung cancer screening. You may have this screening every year starting at age 31 if you have a 30-pack-year history of smoking and currently smoke or have quit within the past 15 years. Fecal occult blood test (FOBT) of the stool. You may have this test every year starting at age 56. Flexible sigmoidoscopy or colonoscopy. You may have a sigmoidoscopy every 5 years or  a colonoscopy every 10 years starting at age 5. Hepatitis C blood test. Hepatitis B blood test. Sexually transmitted disease (STD) testing. Diabetes screening. This is done by checking your blood sugar (glucose) after you have not eaten for a while (fasting). You may have this done every 1-3 years. Bone density scan. This is done to screen for osteoporosis. You may have this done starting at age 21. Mammogram. This may be done every 1-2 years. Talk to your health care provider about how often you  should have regular mammograms. Talk with your health care provider about your test results, treatment options, and if necessary, the need for more tests. Vaccines  Your health care provider may recommend certain vaccines, such as: Influenza vaccine. This is recommended every year. Tetanus, diphtheria, and acellular pertussis (Tdap, Td) vaccine. You may need a Td booster every 10 years. Zoster vaccine. You may need this after age 28. Pneumococcal 13-valent conjugate (PCV13) vaccine. One dose is recommended after age 40. Pneumococcal polysaccharide (PPSV23) vaccine. One dose is recommended after age 75. Talk to your health care provider about which screenings and vaccines you need and how often you need them. This information is not intended to replace advice given to you by your health care provider. Make sure you discuss any questions you have with your health care provider. Document Released: 09/20/2015 Document Revised: 05/13/2016 Document Reviewed: 06/25/2015 Elsevier Interactive Patient Education  2017 [employer] Prevention in the Home Falls can cause injuries. They can happen to people of all ages. There are many things you can do to make your home safe and to help prevent falls. What can I do on the outside of my home? Regularly fix the edges of walkways and driveways and fix any cracks. Remove anything that might make you trip as you walk through a door, such as a raised step or threshold. Trim any bushes or trees on the path to your home. Use bright outdoor lighting. Clear any walking paths of anything that might make someone trip, such as rocks or tools. Regularly check to see if handrails are loose or broken. Make sure that both sides of any steps have handrails. Any raised decks and porches should have guardrails on the edges. Have any leaves, snow, or ice cleared regularly. Use sand or salt on walking paths during winter. Clean up any spills in your garage right away.  This includes oil or grease spills. What can I do in the bathroom? Use night lights. Install grab bars by the toilet and in the tub and shower. Do not use towel bars as grab bars. Use non-skid mats or decals in the tub or shower. If you need to sit down in the shower, use a plastic, non-slip stool. Keep the floor dry. Clean up any water that spills on the floor as soon as it happens. Remove soap buildup in the tub or shower regularly. Attach bath mats securely with double-sided non-slip rug tape. Do not have throw rugs and other things on the floor that can make you trip. What can I do in the bedroom? Use night lights. Make sure that you have a light by your bed that is easy to reach. Do not use any sheets or blankets that are too big for your bed. They should not hang down onto the floor. Have a firm chair that has side arms. You can use this for support while you get dressed. Do not have throw rugs and other things on the floor that  can make you trip. What can I do in the kitchen? Clean up any spills right away. Avoid walking on wet floors. Keep items that you use a lot in easy-to-reach places. If you need to reach something above you, use a strong step stool that has a grab bar. Keep electrical cords out of the way. Do not use floor polish or wax that makes floors slippery. If you must use wax, use non-skid floor wax. Do not have throw rugs and other things on the floor that can make you trip. What can I do with my stairs? Do not leave any items on the stairs. Make sure that there are handrails on both sides of the stairs and use them. Fix handrails that are broken or loose. Make sure that handrails are as long as the stairways. Check any carpeting to make sure that it is firmly attached to the stairs. Fix any carpet that is loose or worn. Avoid having throw rugs at the top or bottom of the stairs. If you do have throw rugs, attach them to the floor with carpet tape. Make sure that  you have a light switch at the top of the stairs and the bottom of the stairs. If you do not have them, ask someone to add them for you. What else can I do to help prevent falls? Wear shoes that: Do not have high heels. Have rubber bottoms. Are comfortable and fit you well. Are closed at the toe. Do not wear sandals. If you use a stepladder: Make sure that it is fully opened. Do not climb a closed stepladder. Make sure that both sides of the stepladder are locked into place. Ask someone to hold it for you, if possible. Clearly mark and make sure that you can see: Any grab bars or handrails. First and last steps. Where the edge of each step is. Use tools that help you move around (mobility aids) if they are needed. These include: Canes. Walkers. Scooters. Crutches. Turn on the lights when you go into a dark area. Replace any light bulbs as soon as they burn out. Set up your furniture so you have a clear path. Avoid moving your furniture around. If any of your floors are uneven, fix them. If there are any pets around you, be aware of where they are. Review your medicines with your doctor. Some medicines can make you feel dizzy. This can increase your chance of falling. Ask your doctor what other things that you can do to help prevent falls. This information is not intended to replace advice given to you by your health care provider. Make sure you discuss any questions you have with your health care provider. Document Released: 06/20/2009 Document Revised: 01/30/2016 Document Reviewed: 09/28/2014 Elsevier Interactive Patient Education  2017 [employer].

## 2023-02-26 NOTE — Progress Notes (Signed)
[facility] [address] [place] [address] Dept: [phone] Dept Fax: [phone]  Office Visit  Subjective:    Patient ID: Destiny Romero, female    DOB: [birth year], 72 y.o..   MRN: [id number]  Chief Complaint  Patient presents with   Cough    Dry cough, headache, fatigue, runny nose   History of Present Illness:  Patient is in today for with a 3-day history of nasal congestion with rhinorrhea, headache, fatigue and a non-productive cough. she had attended a funeral for her brother in [place], [place] earlier int he week. her symptoms have been progressive over the last few days. She is using some Delsym for symptom management.  Past Medical History: Patient Active Problem List   Diagnosis Date Noted   Chronic kidney disease, stage 3a (HCC) 01/27/2023   H/O vitrectomy 07/02/2022   Epiretinal membrane, right eye 06/02/2022   Macular pucker, right eye 05/28/2022   HSV-2 (herpes simplex virus 2) infection 04/09/2022   Chronic rhinitis 04/09/2022   Posttraumatic stress disorder 01/07/2022   Arthritis of hand 11/21/2021   History of premalignant rectal tumor 11/13/2021   Degenerative lumbar spinal stenosis 03/03/2021   Lumbar radiculopathy 01/22/2021   Osteoarthritis of both hands 11/10/2020   Carpal tunnel syndrome of left wrist 04/11/2020   Gastroesophageal reflux disease 11/27/2019   Atypical chest pain 11/07/2018   BMI 29.0-29.9,adult 11/07/2018   Dyspnea 11/07/2018   Dupuytren's contracture of both hands 05/30/2018   Constipation 06/08/2016   Decreased visual acuity 06/08/2016   Type 2 diabetes mellitus with retinopathy (HCC) 08/11/2013   Osteopenia 08/19/2012   Hyperlipidemia 06/27/2008   History of colonic polyps 06/27/2008   Recurrent major depressive disorder (HCC) 01/19/2007   Past Surgical History:  Procedure Laterality Date   CARPAL TUNNEL RELEASE Right 05/2020   CATARACT EXTRACTION, BILATERAL  Bilateral    cataracts   CERVICAL CONE BIOPSY     COLONOSCOPY  2003   COLONOSCOPY  04/01/2011   [facility] - repeat in 5 years   COLONOSCOPY W/ POLYPECTOMY  2007   Dr Kinnie Scales   EXCISION MORTON'S NEUROMA Right    EYE SURGERY     07/02/22, 05/31/22   LAMINECTOMY     1982   LUMBAR LAMINECTOMY Left 11/2022   L4-L5   OOPHORECTOMY Left 12/2011   Dr Nicholas Lose ; ovarian cyst   POLYPECTOMY     RECTAL SURGERY  05/28/2006   pre-canerous lesion removed in 2007; Dr Byrd Hesselbach, [place]   TUBAL LIGATION     Family History  Problem Relation Age of Onset   Diabetes Mother    Dementia Mother    Prostate cancer Father    COPD Father    Heart disease Father        CAD - used nitroglycerin tablets   Diabetes Sister        obese   Thyroid cancer Sister    Cancer Brother        non Hodgkin's Lymphoma, leukemia   Diabetes Brother        not obese   Kidney disease Brother        Kidney failure   Diabetes Brother    Kidney failure Brother    Diabetes Maternal Aunt        X5 ; all IDDM   Heart disease Maternal Aunt        "enlarged heart"   Heart disease Maternal Uncle        "enlarged heart"  Diabetes Maternal Grandmother        IDDM   Diabetes Paternal Grandmother        IDDM   Heart disease Paternal Grandmother    Heart disease Paternal Grandfather        CAD - used nitroglycerin tablets   Colon cancer Neg Hx    Esophageal cancer Neg Hx    Stomach cancer Neg Hx    Stroke Neg Hx    Colon polyps Neg Hx    Rectal cancer Neg Hx    Outpatient Medications Prior to Visit  Medication Sig Dispense Refill   Calcium 200 MG TABS Take by mouth. Tale [phone] mg daily     cetirizine (ZYRTEC) 10 MG tablet Take by mouth.     Cholecalciferol (VITAMIN D) 2000 units CAPS daily.     estradiol (ESTRACE) 0.1 MG/GM vaginal cream Place 1 g vaginally 2 (two) times a week.     famotidine (PEPCID) 40 MG tablet TAKE 1 TABLET BY MOUTH EVERYDAY AT BEDTIME 90 tablet 3   glimepiride (AMARYL) 1 MG tablet Take 1 tablet (1  mg total) by mouth in the morning and at bedtime. 180 tablet 3   Glucosamine-Chondroitin 1500-1200 MG/30ML LIQD daily.     glucose blood (ONETOUCH VERIO) test strip Use as instructed 300 strip 12   lamoTRIgine (LAMICTAL) 200 MG tablet Take 200 mg by mouth daily.     lidocaine (XYLOCAINE) 5 % ointment Apply 1 application. topically as needed. 30 g 0   Multiple Vitamin (MULTI VITAMIN) TABS daily.     omega-3 acid ethyl esters (LOVAZA) 1 g capsule Take by mouth daily.     OneTouch Delica Lancets 33G MISC Use to test blood sugar 3X daily.  Dx Code: E11.65 300 each 1   pioglitazone (ACTOS) 30 MG tablet Take 1 tablet (30 mg total) by mouth daily. 90 tablet 2   rosuvastatin (CRESTOR) 5 MG tablet Take 1 tablet (5 mg total) by mouth daily. 90 tablet 3   TURMERIC PO Take by mouth.     No facility-administered medications prior to visit.   Allergies  Allergen Reactions   Cortisone Palpitations, Other (See Comments) and Nausea And Vomiting    hyperactivity hyperactivity   Short Ragweed Pollen Ext Itching   Grass Pollen(K-O-R-T-Swt Vern) Rash   Other Other (See Comments), Hives, Itching and Nausea Only    Other reaction(s): Headache   Pseudoephedrine Other (See Comments)    tachycardia tachycardia   Atorvastatin Other (See Comments)    Low dose   Codeine    Fluoxetine Hcl Other (See Comments)   Metformin Other (See Comments)    04/15/15 caused dizziness   Metformin And Related     04/15/15 caused dizziness   Pramipexole     nightmares   Shellfish Allergy Other (See Comments)    [other]     Objective:   Today's Vitals   02/26/23 1545  BP: 138/80  Pulse: 69  Temp: 98.6 F (37 C)  TempSrc: Oral  SpO2: 97%  Weight: 174 lb 3.2 oz (79 kg)  Height: 5' 3.5" (1.613 m)   Body mass index is 30.37 kg/m.   General: Well developed, well nourished. No acute distress. HEENT: Normocephalic, non-traumatic. Eyes clear. External ears normal. EAC and TMs normal bilaterally. Nose    clear without  congestion or rhinorrhea. Mucous membranes moist. Oropharynx clear. Good dentition. Neck: Supple. No lymphadenopathy. No thyromegaly. Lungs: Clear to auscultation bilaterally. No wheezing, rales or rhonchi. Psych: Alert and oriented. Normal  Romero and affect.  Health Maintenance Due  Topic Date Due   COVID-19 Vaccine (3 - 2023-24 season) 05/08/2022   DTaP/Tdap/Td (2 - Td or Tdap) 02/04/2023   Lab results: POCT Covid: Neg.    Assessment & Plan:   Problem List Items Addressed This Visit       Respiratory   Viral URI with cough - Primary    Discussed home care for viral illness, including rest, pushing fluids, and OTC medications as needed for symptom relief. Recommend hot tea with honey for sore throat symptoms. I will prescribe some cough syrup to help her rest at night. Follow-up if needed for worsening or persistent symptoms.       Relevant Medications   azelastine (ASTELIN) 0.1 % nasal spray   HYDROcodone bit-homatropine (HYCODAN) 5-1.5 MG/5ML syrup    Return if symptoms worsen or fail to improve.   Loyola Mast, MD

## 2023-03-01 ENCOUNTER — Telehealth: Payer: Self-pay

## 2023-03-01 ENCOUNTER — Encounter: Payer: Self-pay | Admitting: Family Medicine

## 2023-03-01 DIAGNOSIS — J069 Acute upper respiratory infection, unspecified: Secondary | ICD-10-CM

## 2023-03-01 MED ORDER — HYDROCOD POLI-CHLORPHE POLI ER 10-8 MG/5ML PO SUER
5.0000 mL | Freq: Two times a day (BID) | ORAL | 0 refills | Status: DC | PRN
Start: 2023-03-01 — End: 2023-03-29

## 2023-03-01 NOTE — Telephone Encounter (Signed)
Received notification that pharmacy temporally out for the Hydrocodone/Homatropine solution.  Can you send an alternative medication? Thanks. Dm/cma

## 2023-03-01 NOTE — Addendum Note (Signed)
Addended by: Loyola Mast on: 03/01/2023 02:53 PM   Modules accepted: Orders

## 2023-03-01 NOTE — Telephone Encounter (Signed)
Message sent to provider already.  Waiting on response. Dm/cma

## 2023-03-02 NOTE — Telephone Encounter (Signed)
Spoke to patient and she is aware the it was sent to the pharmacy. Dm/cma

## 2023-03-02 NOTE — Progress Notes (Deleted)
GYNECOLOGY  VISIT   HPI: 72 y.o.   Single  Caucasian  female   [id number] with Patient's last menstrual period was 04/07/2001 (approximate).   here for   shgm w endo bx  GYNECOLOGIC HISTORY: Patient's last menstrual period was 04/07/2001 (approximate). Contraception:  PMP Menopausal hormone therapy:  estrace Last mammogram:  08/11/22 Breast Density Cat A, BI-RADS CAT 1 neg  Last pap smear:   01/18/23 neg:HR HPV neg, 01/01/22 LSIL: HR HPV neg        OB History     Gravida  0   Para  0   Term  0   Preterm  0   AB  0   Living  0      SAB  0   IAB  0   Ectopic  0   Multiple  0   Live Births  0              Patient Active Problem List   Diagnosis Date Noted   Viral URI with cough 02/26/2023   Chronic kidney disease, stage 3a (HCC) 01/27/2023   H/O vitrectomy 07/02/2022   Epiretinal membrane, right eye 06/02/2022   Macular pucker, right eye 05/28/2022   HSV-2 (herpes simplex virus 2) infection 04/09/2022   Chronic rhinitis 04/09/2022   Posttraumatic stress disorder 01/07/2022   Arthritis of hand 11/21/2021   History of premalignant rectal tumor 11/13/2021   Degenerative lumbar spinal stenosis 03/03/2021   Lumbar radiculopathy 01/22/2021   Osteoarthritis of both hands 11/10/2020   Carpal tunnel syndrome of left wrist 04/11/2020   Gastroesophageal reflux disease 11/27/2019   Atypical chest pain 11/07/2018   BMI 29.0-29.9,adult 11/07/2018   Dyspnea 11/07/2018   Dupuytren's contracture of both hands 05/30/2018   Constipation 06/08/2016   Decreased visual acuity 06/08/2016   Type 2 diabetes mellitus with retinopathy (HCC) 08/11/2013   Osteopenia 08/19/2012   Hyperlipidemia 06/27/2008   History of colonic polyps 06/27/2008   Recurrent major depressive disorder (HCC) 01/19/2007    Past Medical History:  Diagnosis Date   Allergy    Anal lesion 2007   Anemia    Anesthesia complication    Per pt/ sensitive to sedation!   Arthritis    fingers,toes   Cancer  (HCC)    "PRECANCEROUS LESIONS IN RECTUM"   Cataract    bilateral,removed   Central retinal artery occlusion 06/08/2016   limited vision right eye.   Cervical dysplasia 04/1991   Dr Laurena Bering - CIN I, cone and freeze in 1990s with normal paps since   Constipation 06/08/2016   moves bowels every 3 rd day    Decreased visual acuity 06/08/2016   Retinal bleed right eye 2017   Depression    Diabetes mellitus without complication (HCC)    Fatigue 06/27/2017   H/O measles    H/O mumps    History of chicken pox    Hyperlipidemia    Mitral valve prolapse    NO MR; no SBE prophylaxis needed   Osteoporosis    PONV (postoperative nausea and vomiting)    Pre-diabetes    highest A1c 6.4 %   Preventative health care 12/21/2016   Squamous cell carcinoma in situ of skin    Dr Hortense Ramal   STD (sexually transmitted disease)    HSV   Vision impairment    limited vision in right eye.    Past Surgical History:  Procedure Laterality Date   CARPAL TUNNEL RELEASE Right 05/2020   CATARACT EXTRACTION, BILATERAL Bilateral  cataracts   CERVICAL CONE BIOPSY     COLONOSCOPY  2003   COLONOSCOPY  04/01/2011    - repeat in 5 years   COLONOSCOPY W/ POLYPECTOMY  2007   Dr Kinnie Scales   EXCISION MORTON'S NEUROMA Right    EYE SURGERY     07/02/22, 05/31/22   LAMINECTOMY     1982   LUMBAR LAMINECTOMY Left 11/2022   L4-L5   OOPHORECTOMY Left 12/2011   Dr Nicholas Lose ; ovarian cyst   POLYPECTOMY     RECTAL SURGERY  05/28/2006   pre-canerous lesion removed in 2007; Dr Byrd Hesselbach, [place]   TUBAL LIGATION      Current Outpatient Medications  Medication Sig Dispense Refill   azelastine (ASTELIN) 0.1 % nasal spray Place 1 spray into both nostrils 2 (two) times daily. Use in each nostril as directed 30 mL 12   Calcium 200 MG TABS Take by mouth. Tale [phone] mg daily     cetirizine (ZYRTEC) 10 MG tablet Take by mouth.     chlorpheniramine-HYDROcodone (TUSSIONEX) 10-8 MG/5ML Take 5 mLs by mouth every 12 (twelve)  hours as needed for cough. 115 mL 0   Cholecalciferol (VITAMIN D) 2000 units CAPS daily.     estradiol (ESTRACE) 0.1 MG/GM vaginal cream Place 1 g vaginally 2 (two) times a week.     famotidine (PEPCID) 40 MG tablet TAKE 1 TABLET BY MOUTH EVERYDAY AT BEDTIME 90 tablet 3   glimepiride (AMARYL) 1 MG tablet Take 1 tablet (1 mg total) by mouth in the morning and at bedtime. 180 tablet 3   Glucosamine-Chondroitin 1500-1200 MG/30ML LIQD daily.     glucose blood (ONETOUCH VERIO) test strip Use as instructed 300 strip 12   lamoTRIgine (LAMICTAL) 200 MG tablet Take 200 mg by mouth daily.     lidocaine (XYLOCAINE) 5 % ointment Apply 1 application. topically as needed. 30 g 0   Multiple Vitamin (MULTI VITAMIN) TABS daily.     omega-3 acid ethyl esters (LOVAZA) 1 g capsule Take by mouth daily.     OneTouch Delica Lancets 33G MISC Use to test blood sugar 3X daily.  Dx Code: E11.65 300 each 1   pioglitazone (ACTOS) 30 MG tablet Take 1 tablet (30 mg total) by mouth daily. 90 tablet 2   rosuvastatin (CRESTOR) 5 MG tablet Take 1 tablet (5 mg total) by mouth daily. 90 tablet 3   TURMERIC PO Take by mouth.     No current facility-administered medications for this visit.     ALLERGIES: Cortisone, Short ragweed pollen ext, Grass pollen([id number]-o-r-t-swt vern), Other, Pseudoephedrine, Atorvastatin, Codeine, Fluoxetine hcl, Metformin, Metformin and related, Pramipexole, and Shellfish allergy  Family History  Problem Relation Age of Onset   Diabetes Mother    Dementia Mother    Prostate cancer Father    COPD Father    Heart disease Father        CAD - used nitroglycerin tablets   Diabetes Sister        obese   Thyroid cancer Sister    Cancer Brother        non Hodgkin's Lymphoma, leukemia   Diabetes Brother        not obese   Kidney disease Brother        Kidney failure   Diabetes Brother    Kidney failure Brother    Diabetes Maternal Aunt        X5 ; all IDDM   Heart disease Maternal Aunt         "  enlarged heart"   Heart disease Maternal Uncle        "enlarged heart"   Diabetes Maternal Grandmother        IDDM   Diabetes Paternal Grandmother        IDDM   Heart disease Paternal Grandmother    Heart disease Paternal Grandfather        CAD - used nitroglycerin tablets   Colon cancer Neg Hx    Esophageal cancer Neg Hx    Stomach cancer Neg Hx    Stroke Neg Hx    Colon polyps Neg Hx    Rectal cancer Neg Hx     Social History   Socioeconomic History   Marital status: Single    Spouse name: Not on file   Number of children: 0   Years of education: Not on file   Highest education level: Bachelor's degree (e.g., BA, AB, BS)  Occupational History   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  Tobacco Use   Smoking status: Never   Smokeless tobacco: Never  Vaping Use   Vaping Use: Never used  Substance and Sexual Activity   Alcohol use: Yes    Comment: glass wine once a month   Drug use: No   Sexual activity: Yes    Partners: Male    Comment: 1st intercourse 72 yo-More than 5 partners  Other Topics Concern   Not on file  Social History Narrative   Lives alone   No dietary restrictions   Social Determinants of Health   Financial Resource Strain: Low Risk  (02/25/2023)   Overall Financial Resource Strain (CARDIA)    Difficulty of Paying Living Expenses: Not hard at all  Food Insecurity: No Food Insecurity (02/25/2023)   Hunger Vital Sign    Worried About Running Out of Food in the Last Year: Never true    Ran Out of Food in the Last Year: Never true  Transportation Needs: No Transportation Needs (02/25/2023)   PRAPARE - Administrator, Civil Service (Medical): No    Lack of Transportation (Non-Medical): No  Physical Activity: Insufficiently Active (02/25/2023)   Exercise Vital Sign    Days of Exercise per Week: 4 days    Minutes of Exercise per Session: 30 min  Stress: No Stress Concern Present (02/25/2023)   [employer] of Occupational Health -  Occupational Stress Questionnaire    Feeling of Stress : Only a little  Recent Concern: Stress - Stress Concern Present (12/21/2022)   [employer] of Occupational Health - Occupational Stress Questionnaire    Feeling of Stress : To some extent  Social Connections: Moderately Integrated (02/25/2023)   Social Connection and Isolation Panel [NHANES]    Frequency of Communication with Friends and Family: Once a week    Frequency of Social Gatherings with Friends and Family: Twice a week    Attends Religious Services: More than 4 times per year    Active Member of [employer] or Organizations: Yes    Attends Engineer, structural: More than 4 times per year    Marital Status: Divorced  Intimate Partner Violence: Not At Risk (02/26/2023)   Humiliation, Afraid, Rape, and Kick questionnaire    Fear of Current or Ex-Partner: No    Emotionally Abused: No    Physically Abused: No    Sexually Abused: No    Review of Systems  PHYSICAL EXAMINATION:    LMP 04/07/2001 (Approximate)     General appearance: alert, cooperative and appears stated age Head:  Normocephalic, without obvious abnormality, atraumatic Neck: no adenopathy, supple, symmetrical, trachea midline and thyroid normal to inspection and palpation Lungs: clear to auscultation bilaterally Breasts: normal appearance, no masses or tenderness, No nipple retraction or dimpling, No nipple discharge or bleeding, No axillary or supraclavicular adenopathy Heart: regular rate and rhythm Abdomen: soft, non-tender, no masses,  no organomegaly Extremities: extremities normal, atraumatic, no cyanosis or edema Skin: Skin color, texture, turgor normal. No rashes or lesions Lymph nodes: Cervical, supraclavicular, and axillary nodes normal. No abnormal inguinal nodes palpated Neurologic: Grossly normal  Pelvic: External genitalia:  no lesions              Urethra:  normal appearing urethra with no masses, tenderness or lesions               Bartholins and Skenes: normal                 Vagina: normal appearing vagina with normal color and discharge, no lesions              Cervix: no lesions                Bimanual Exam:  Uterus:  normal size, contour, position, consistency, mobility, non-tender              Adnexa: no mass, fullness, tenderness              Rectal exam: {yes no:[id number]}.  Confirms.              Anus:  normal sphincter tone, no lesions  Chaperone was present for exam:  ***  ASSESSMENT     PLAN     An After Visit Summary was printed and given to the patient.  ______ minutes face to face time of which over 50% was spent in counseling.

## 2023-03-09 NOTE — Therapy (Unsigned)
OUTPATIENT PHYSICAL THERAPY THORACOLUMBAR EVALUATION   Patient Name: Destiny Romero MRN: [id number] DOB:1950-11-10, 72 y.o., female Today's Date: 03/10/2023  END OF SESSION:  PT End of Session - 03/10/23 1052     Visit Number 1    Date for PT Re-Evaluation 05/19/23    PT Start Time 1053    PT Stop Time 1128    PT Time Calculation (min) 35 min    Activity Tolerance Patient tolerated treatment well    Behavior During Therapy [facility] for tasks assessed/performed             Past Medical History:  Diagnosis Date   Allergy    Anal lesion 2007   Anemia    Anesthesia complication    Per pt/ sensitive to sedation!   Arthritis    fingers,toes   Cancer (HCC)    "PRECANCEROUS LESIONS IN RECTUM"   Cataract    bilateral,removed   Central retinal artery occlusion 06/08/2016   limited vision right eye.   Cervical dysplasia 04/1991   Dr Laurena Bering - CIN I, cone and freeze in 1990s with normal paps since   Constipation 06/08/2016   moves bowels every 3 rd day    Decreased visual acuity 06/08/2016   Retinal bleed right eye 2017   Depression    Diabetes mellitus without complication (HCC)    Fatigue 06/27/2017   H/O measles    H/O mumps    History of chicken pox    Hyperlipidemia    Mitral valve prolapse    NO MR; no SBE prophylaxis needed   Osteoporosis    PONV (postoperative nausea and vomiting)    Pre-diabetes    highest A1c 6.4 %   Preventative health care 12/21/2016   Squamous cell carcinoma in situ of skin    Dr Hortense Ramal   STD (sexually transmitted disease)    HSV   Vision impairment    limited vision in right eye.   Past Surgical History:  Procedure Laterality Date   CARPAL TUNNEL RELEASE Right 05/2020   CATARACT EXTRACTION, BILATERAL Bilateral    cataracts   CERVICAL CONE BIOPSY     COLONOSCOPY  2003   COLONOSCOPY  04/01/2011   [facility] - repeat in 5 years   COLONOSCOPY W/ POLYPECTOMY  2007   Dr Kinnie Scales   EXCISION MORTON'S NEUROMA Right    EYE SURGERY      07/02/22, 05/31/22   LAMINECTOMY     1982   LUMBAR LAMINECTOMY Left 11/2022   L4-L5   OOPHORECTOMY Left 12/2011   Dr Nicholas Lose ; ovarian cyst   POLYPECTOMY     RECTAL SURGERY  05/28/2006   pre-canerous lesion removed in 2007; Dr Byrd Hesselbach, [place]   TUBAL LIGATION     Patient Active Problem List   Diagnosis Date Noted   Viral URI with cough 02/26/2023   Chronic kidney disease, stage 3a (HCC) 01/27/2023   H/O vitrectomy 07/02/2022   Epiretinal membrane, right eye 06/02/2022   Macular pucker, right eye 05/28/2022   HSV-2 (herpes simplex virus 2) infection 04/09/2022   Chronic rhinitis 04/09/2022   Posttraumatic stress disorder 01/07/2022   Arthritis of hand 11/21/2021   History of premalignant rectal tumor 11/13/2021   Degenerative lumbar spinal stenosis 03/03/2021   Lumbar radiculopathy 01/22/2021   Osteoarthritis of both hands 11/10/2020   Carpal tunnel syndrome of left wrist 04/11/2020   Gastroesophageal reflux disease 11/27/2019   Atypical chest pain 11/07/2018   BMI 29.0-29.9,adult 11/07/2018   Dyspnea 11/07/2018  Dupuytren's contracture of both hands 05/30/2018   Constipation 06/08/2016   Decreased visual acuity 06/08/2016   Type 2 diabetes mellitus with retinopathy (HCC) 08/11/2013   Osteopenia 08/19/2012   Hyperlipidemia 06/27/2008   History of colonic polyps 06/27/2008   Recurrent major depressive disorder (HCC) 01/19/2007    PCP: Loyola Mast, MD  REFERRING PROVIDER: Arman Bogus, MD   REFERRING DIAG: M54.16 (ICD-10-CM) - Radiculopathy, lumbar region   Rationale for Evaluation and Treatment: Rehabilitation  THERAPY DIAG:  Muscle weakness (generalized)  Chronic low back pain, unspecified back pain laterality, unspecified whether sciatica present  Pain in right hip  Difficulty in walking, not elsewhere classified  ONSET DATE: 03/03/23  SUBJECTIVE:                                                                                                                                                                                            SUBJECTIVE STATEMENT: Patient reports that she had lumbar surgery for spinal stenosis causing nerve impingement 1-2 years ago. She got a lot of relief from this, but now has increasing pain in her R post hip, occasionally on the L as well. She also does have R groin pain, attributed to hip arthritis. She also has pain R great toe from arthritis. She want to avoid any more surgery.  PERTINENT HISTORY:  Per referring Physician note in April:  doing really well from spine standpoint, no real back pain, no leg pain or NTW, still has R groin pain with wt bearing, was told at [facility] she has &#34;arthritis&#34; there. had xrays in Feb which show mild degen changes     PAIN:  Are you having pain? Yes: NPRS scale: 4/10 Pain location: R posterior hip, piriformis area. Pain description: aching Aggravating factors: standing and walking Relieving factors: A little bit of motion helps  PRECAUTIONS: Back  WEIGHT BEARING RESTRICTIONS: No  FALLS:  Has patient fallen in last 6 months? 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  PLOF: Independent  PATIENT GOALS: Wants to get by to walking and hiking, get into the gym  NEXT MD VISIT: No follow up scheduled  OBJECTIVE:   DIAGNOSTIC FINDINGS:  N/A  PATIENT SURVEYS:  FOTO 52  SCREENING FOR RED FLAGS: Bowel or bladder incontinence: No Spinal tumors: No Cauda equina syndrome: No Compression fracture: No Abdominal aneurysm: No  COGNITION: Overall cognitive status: Within functional limits for tasks assessed     SENSATION: WFL  MUSCLE LENGTH: Hamstrings: Right  70 deg; Left 75 deg Thomas test: Tight B hip flexors  POSTURE: No Significant postural limitations  PALPATION: TTP R QL, gluts,  piriformis, ITB  LUMBAR ROM: Reports tightness at end range with all motion, but no real pain.  AROM eval  Flexion To toes  Extension 100%  Right lateral flexion To knee  Left lateral flexion To knee  Right rotation 80%  Left rotation 80%   (Blank rows = not tested)  LOWER EXTREMITY ROM:   Generally WNL, some pain at end of range with R hip motion.   LOWER EXTREMITY MMT:  LLE 5/5, R limited by pain  MMT Right eval Left eval  Hip flexion 3+   Hip extension 3   Hip abduction 4-   Hip adduction    Hip internal rotation    Hip external rotation 3+   Knee flexion 4   Knee extension 4   Ankle dorsiflexion 4   Ankle plantarflexion    Ankle inversion    Ankle eversion       LUMBAR SPECIAL TESTS:  Straight leg raise test: Positive, Slump test: Positive, and Single leg stance test: Positive  FUNCTIONAL TESTS:  5 times sit to stand: 15.58  GAIT: Distance walked: In clinic distances Assistive device utilized: None Level of assistance: Complete Independence Comments: Mildly antalgic gait pattern with decrease WB through R hip.  TODAY'S TREATMENT:                                                                                                                              DATE:  03/10/23    PATIENT EDUCATION:  Education details: POC Person educated: Patient Education method: Explanation Education comprehension: verbalized understanding  HOME EXERCISE PROGRAM: TBD  ASSESSMENT:  CLINICAL IMPRESSION: Patient is a 72 y.o. who was seen today for physical therapy evaluation and treatment for lumbar radiculopathy and R hip arthritis. She demonstrates some weakness in her R hip as well as trunk, causing instability. She is TTP all around the R hip and did test positive for SLR, slump test on R. She will benefit from PT for STM to her R hip muscles as well as stretching and strengthening to her LE and trunk muscles to improve her postural stability and allow her to return to PLOF  including some hiking and to be able to get up from the floor with confidence and no pain.  OBJECTIVE IMPAIRMENTS: Abnormal gait, decreased activity tolerance, decreased balance, decreased coordination, difficulty walking, decreased ROM, decreased strength, impaired flexibility, postural dysfunction, and pain.   ACTIVITY LIMITATIONS: carrying, lifting, bending, standing, squatting, stairs, and locomotion level  PARTICIPATION LIMITATIONS: meal prep, cleaning, laundry, and community activity  PERSONAL FACTORS: Past/current experiences are also affecting patient's functional outcome.   REHAB POTENTIAL: Good  CLINICAL DECISION MAKING: Stable/uncomplicated  EVALUATION COMPLEXITY: Low   GOALS: Goals reviewed with patient? Yes  SHORT TERM GOALS: Target date: 03/24/23  I with intiial HEP Baseline: Goal status: INITIAL  LONG TERM GOALS: Target date: 9\11\24  I with final HEP Baseline:  Goal status: INITIAL  2.  Increase FOTO score to at least 58 Baseline: 52 Goal status: INITIAL  3.  Decrease 5x STS to < 12 sec Baseline: 15.4 Goal status: INITIAL  4.  Patient will walk on unlevel surfaces x at least 1000' with pain < 3/10 Baseline: Limited walking of any kind due to pain. Goal status: INITIAL  5.  Patient will report pain of < 3/10 with any of her normal daily activities, not having to stop and sit due to pain. Baseline: Limited activity, has to sit down after about 15 min of standing or walking. Goal status: INITIAL  6.  Patient will be able to get down to the floor and back up MI, with confidence. Baseline: Fearful to get down to the floor due to difficulty rising. Goal status: INITIAL  PLAN:  PT FREQUENCY: 1-2x/week  PT DURATION: 10 weeks  PLANNED INTERVENTIONS: Therapeutic exercises, Therapeutic activity, Neuromuscular re-education, Balance training, Gait training, Patient/Family education, Self Care, Joint mobilization, Stair training, Dry Needling, Electrical  stimulation, Spinal mobilization, Cryotherapy, Moist heat, Ionotophoresis 4mg /ml Dexamethasone, and Manual therapy.  PLAN FOR NEXT SESSION: HEP   Iona Beard, DPT 03/10/2023, 1:01 PM

## 2023-03-10 ENCOUNTER — Ambulatory Visit: Payer: Medicare HMO | Attending: Neurological Surgery | Admitting: Physical Therapy

## 2023-03-10 ENCOUNTER — Encounter: Payer: Self-pay | Admitting: Physical Therapy

## 2023-03-10 DIAGNOSIS — M62830 Muscle spasm of back: Secondary | ICD-10-CM | POA: Diagnosis not present

## 2023-03-10 DIAGNOSIS — R2689 Other abnormalities of gait and mobility: Secondary | ICD-10-CM | POA: Insufficient documentation

## 2023-03-10 DIAGNOSIS — M545 Low back pain, unspecified: Secondary | ICD-10-CM | POA: Insufficient documentation

## 2023-03-10 DIAGNOSIS — M6281 Muscle weakness (generalized): Secondary | ICD-10-CM | POA: Insufficient documentation

## 2023-03-10 DIAGNOSIS — M25551 Pain in right hip: Secondary | ICD-10-CM | POA: Diagnosis not present

## 2023-03-10 DIAGNOSIS — M25651 Stiffness of right hip, not elsewhere classified: Secondary | ICD-10-CM | POA: Diagnosis not present

## 2023-03-10 DIAGNOSIS — R262 Difficulty in walking, not elsewhere classified: Secondary | ICD-10-CM | POA: Diagnosis not present

## 2023-03-10 DIAGNOSIS — G8929 Other chronic pain: Secondary | ICD-10-CM | POA: Diagnosis not present

## 2023-03-16 ENCOUNTER — Encounter: Payer: Self-pay | Admitting: Physical Therapy

## 2023-03-16 ENCOUNTER — Other Ambulatory Visit: Payer: Medicare HMO

## 2023-03-16 ENCOUNTER — Other Ambulatory Visit: Payer: Medicare HMO | Admitting: Obstetrics and Gynecology

## 2023-03-16 ENCOUNTER — Ambulatory Visit: Payer: Medicare HMO | Admitting: Physical Therapy

## 2023-03-16 DIAGNOSIS — M62830 Muscle spasm of back: Secondary | ICD-10-CM | POA: Diagnosis not present

## 2023-03-16 DIAGNOSIS — M545 Low back pain, unspecified: Secondary | ICD-10-CM | POA: Diagnosis not present

## 2023-03-16 DIAGNOSIS — R262 Difficulty in walking, not elsewhere classified: Secondary | ICD-10-CM

## 2023-03-16 DIAGNOSIS — R2689 Other abnormalities of gait and mobility: Secondary | ICD-10-CM | POA: Diagnosis not present

## 2023-03-16 DIAGNOSIS — M6281 Muscle weakness (generalized): Secondary | ICD-10-CM | POA: Diagnosis not present

## 2023-03-16 DIAGNOSIS — G8929 Other chronic pain: Secondary | ICD-10-CM | POA: Diagnosis not present

## 2023-03-16 DIAGNOSIS — M25551 Pain in right hip: Secondary | ICD-10-CM

## 2023-03-16 DIAGNOSIS — M25651 Stiffness of right hip, not elsewhere classified: Secondary | ICD-10-CM | POA: Diagnosis not present

## 2023-03-16 NOTE — Therapy (Signed)
OUTPATIENT PHYSICAL THERAPY THORACOLUMBAR EVALUATION   Patient Name: Destiny Romero MRN: [id number] DOB:26-Jun-1951, 72 y.o., female Today's Date: 03/16/2023  END OF SESSION:  PT End of Session - 03/16/23 1016     Visit Number 2    Date for PT Re-Evaluation 05/19/23    PT Start Time 1015    PT Stop Time 1100    PT Time Calculation (min) 45 min    Activity Tolerance Patient tolerated treatment well    Behavior During Therapy [facility] for tasks assessed/performed             Past Medical History:  Diagnosis Date   Allergy    Anal lesion 2007   Anemia    Anesthesia complication    Per pt/ sensitive to sedation!   Arthritis    fingers,toes   Cancer (HCC)    "PRECANCEROUS LESIONS IN RECTUM"   Cataract    bilateral,removed   Central retinal artery occlusion 06/08/2016   limited vision right eye.   Cervical dysplasia 04/1991   Dr Laurena Bering - CIN I, cone and freeze in 1990s with normal paps since   Constipation 06/08/2016   moves bowels every 3 rd day    Decreased visual acuity 06/08/2016   Retinal bleed right eye 2017   Depression    Diabetes mellitus without complication (HCC)    Fatigue 06/27/2017   H/O measles    H/O mumps    History of chicken pox    Hyperlipidemia    Mitral valve prolapse    NO MR; no SBE prophylaxis needed   Osteoporosis    PONV (postoperative nausea and vomiting)    Pre-diabetes    highest A1c 6.4 %   Preventative health care 12/21/2016   Squamous cell carcinoma in situ of skin    Dr Hortense Ramal   STD (sexually transmitted disease)    HSV   Vision impairment    limited vision in right eye.   Past Surgical History:  Procedure Laterality Date   CARPAL TUNNEL RELEASE Right 05/2020   CATARACT EXTRACTION, BILATERAL Bilateral    cataracts   CERVICAL CONE BIOPSY     COLONOSCOPY  2003   COLONOSCOPY  04/01/2011   [place] - repeat in 5 years   COLONOSCOPY W/ POLYPECTOMY  2007   Dr Kinnie Scales   EXCISION MORTON'S NEUROMA Right    EYE SURGERY      07/02/22, 05/31/22   LAMINECTOMY     1982   LUMBAR LAMINECTOMY Left 11/2022   L4-L5   OOPHORECTOMY Left 12/2011   Dr Nicholas Lose ; ovarian cyst   POLYPECTOMY     RECTAL SURGERY  05/28/2006   pre-canerous lesion removed in 2007; Dr Byrd Hesselbach, [place]   TUBAL LIGATION     Patient Active Problem List   Diagnosis Date Noted   Viral URI with cough 02/26/2023   Chronic kidney disease, stage 3a (HCC) 01/27/2023   H/O vitrectomy 07/02/2022   Epiretinal membrane, right eye 06/02/2022   Macular pucker, right eye 05/28/2022   HSV-2 (herpes simplex virus 2) infection 04/09/2022   Chronic rhinitis 04/09/2022   Posttraumatic stress disorder 01/07/2022   Arthritis of hand 11/21/2021   History of premalignant rectal tumor 11/13/2021   Degenerative lumbar spinal stenosis 03/03/2021   Lumbar radiculopathy 01/22/2021   Osteoarthritis of both hands 11/10/2020   Carpal tunnel syndrome of left wrist 04/11/2020   Gastroesophageal reflux disease 11/27/2019   Atypical chest pain 11/07/2018   BMI 29.0-29.9,adult 11/07/2018   Dyspnea 11/07/2018  Dupuytren's contracture of both hands 05/30/2018   Constipation 06/08/2016   Decreased visual acuity 06/08/2016   Type 2 diabetes mellitus with retinopathy (HCC) 08/11/2013   Osteopenia 08/19/2012   Hyperlipidemia 06/27/2008   History of colonic polyps 06/27/2008   Recurrent major depressive disorder (HCC) 01/19/2007    PCP: Loyola Mast, MD  REFERRING PROVIDER: Arman Bogus, MD   REFERRING DIAG: M54.16 (ICD-10-CM) - Radiculopathy, lumbar region   Rationale for Evaluation and Treatment: Rehabilitation  THERAPY DIAG:  Muscle weakness (generalized)  Pain in right hip  Chronic low back pain, unspecified back pain laterality, unspecified whether sciatica present  Difficulty in walking, not elsewhere classified  ONSET DATE: 03/03/23  SUBJECTIVE:                                                                                                                                                                                            SUBJECTIVE STATEMENT: Pretty good, typically even before surgery it only hurts when she moves. Back to walking  PERTINENT HISTORY:  Per referring Physician note in April:  doing really well from spine standpoint, no real back pain, no leg pain or NTW, still has R groin pain with wt bearing, was told at [facility] she has &#34;arthritis&#34; there. had xrays in Feb which show mild degen changes     PAIN:  Are you having pain? Yes: NPRS scale: 3/10 Pain location: R posterior hip, piriformis area. Pain description: aching Aggravating factors: standing and walking Relieving factors: A little bit of motion helps  PRECAUTIONS: Back  WEIGHT BEARING RESTRICTIONS: No  FALLS:  Has patient fallen in last 6 months? 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  PLOF: Independent  PATIENT GOALS: Wants to get by to walking and hiking, get into the gym  NEXT MD VISIT: No follow up scheduled  OBJECTIVE:   DIAGNOSTIC FINDINGS:  N/A  PATIENT SURVEYS:  FOTO 52  SCREENING FOR RED FLAGS: Bowel or bladder incontinence: No Spinal tumors: No Cauda equina syndrome: No Compression fracture: No Abdominal aneurysm: No  COGNITION: Overall cognitive status: Within functional limits for tasks assessed     SENSATION: WFL  MUSCLE LENGTH: Hamstrings: Right 70 deg; Left 75 deg Thomas test: Tight B hip flexors  POSTURE: No Significant postural limitations  PALPATION: TTP R QL, gluts, piriformis, ITB  LUMBAR ROM: Reports tightness at end range with all motion, but no real pain.  AROM eval  Flexion To toes  Extension 100%  Right  lateral flexion To knee  Left lateral flexion To knee  Right rotation 80%  Left rotation 80%   (Blank rows = not  tested)  LOWER EXTREMITY ROM:   Generally WNL, some pain at end of range with R hip motion.   LOWER EXTREMITY MMT:  LLE 5/5, R limited by pain  MMT Right eval Left eval  Hip flexion 3+   Hip extension 3   Hip abduction 4-   Hip adduction    Hip internal rotation    Hip external rotation 3+   Knee flexion 4   Knee extension 4   Ankle dorsiflexion 4   Ankle plantarflexion    Ankle inversion    Ankle eversion       LUMBAR SPECIAL TESTS:  Straight leg raise test: Positive, Slump test: Positive, and Single leg stance test: Positive  FUNCTIONAL TESTS:  5 times sit to stand: 15.58  GAIT: Distance walked: In clinic distances Assistive device utilized: None Level of assistance: Complete Independence Comments: Mildly antalgic gait pattern with decrease WB through R hip.  TODAY'S TREATMENT:                                                                                                                              DATE:  03/16/23 Bike L2 x6 min  S2S 2x10  Hamstring curls 25lb 2x10 Leg Ext 5lb 2x10 Rows green 2x10 Shoulder Ext green 2x10  Passive stretching to Hamstrings, Single K2C, piriformis, lower trunk rotations.  03/10/23    PATIENT EDUCATION:  Education details: POC Person educated: Patient Education method: Explanation Education comprehension: verbalized understanding  HOME EXERCISE PROGRAM: TBD  ASSESSMENT:  CLINICAL IMPRESSION: Patient is a 72 y.o. who was seen today for physical therapy treatment for lumbar radiculopathy and R hip arthritis. Pt tolerated an initial progression to TE well evident by no subjective reports of increase pain. Pt has forward rounded shoulders at rest requiring postural cues with rows and extensions. Cue for full TKE needed with leg extensions. L hip piriformis tightness noted compared to the R. PT voiced interest in returning to the gym.Marland Kitchen  She will benefit from PT for STM to her R hip muscles as well as stretching and strengthening  to her LE and trunk muscles to improve her postural stability and allow her to return to PLOF including some hiking and to be able to get up from the floor with confidence and no pain.  OBJECTIVE IMPAIRMENTS: Abnormal gait, decreased activity tolerance, decreased balance, decreased coordination, difficulty walking, decreased ROM, decreased strength, impaired flexibility, postural dysfunction, and pain.   ACTIVITY LIMITATIONS: carrying, lifting, bending, standing, squatting, stairs, and locomotion level  PARTICIPATION LIMITATIONS: meal prep, cleaning, laundry, and community activity  PERSONAL FACTORS: Past/current experiences are also affecting patient's functional outcome.   REHAB POTENTIAL: Good  CLINICAL DECISION MAKING: Stable/uncomplicated  EVALUATION COMPLEXITY: Low   GOALS: Goals reviewed with patient? Yes  SHORT TERM GOALS: Target date: 03/24/23  I with  intiial HEP Baseline: Goal status: INITIAL  LONG TERM GOALS: Target date: 9\11\24  I with final HEP Baseline:  Goal status: INITIAL  2.  Increase FOTO score to at least 58 Baseline: 52 Goal status: INITIAL  3.  Decrease 5x STS to < 12 sec Baseline: 15.4 Goal status: INITIAL  4.  Patient will walk on unlevel surfaces x at least 1000' with pain < 3/10 Baseline: Limited walking of any kind due to pain. Goal status: INITIAL  5.  Patient will report pain of < 3/10 with any of her normal daily activities, not having to stop and sit due to pain. Baseline: Limited activity, has to sit down after about 15 min of standing or walking. Goal status: INITIAL  6.  Patient will be able to get down to the floor and back up MI, with confidence. Baseline: Fearful to get down to the floor due to difficulty rising. Goal status: INITIAL  PLAN:  PT FREQUENCY: 1-2x/week  PT DURATION: 10 weeks  PLANNED INTERVENTIONS: Therapeutic exercises, Therapeutic activity, Neuromuscular re-education, Balance training, Gait training,  Patient/Family education, Self Care, Joint mobilization, Stair training, Dry Needling, Electrical stimulation, Spinal mobilization, Cryotherapy, Moist heat, Ionotophoresis 4mg /ml Dexamethasone, and Manual therapy.  PLAN FOR NEXT SESSION: HEP   Iona Beard, DPT 03/16/2023, 10:17 AM

## 2023-03-19 ENCOUNTER — Ambulatory Visit: Payer: Medicare HMO | Admitting: Physical Therapy

## 2023-03-19 ENCOUNTER — Encounter: Payer: Self-pay | Admitting: Physical Therapy

## 2023-03-19 DIAGNOSIS — M25551 Pain in right hip: Secondary | ICD-10-CM | POA: Diagnosis not present

## 2023-03-19 DIAGNOSIS — M62830 Muscle spasm of back: Secondary | ICD-10-CM | POA: Diagnosis not present

## 2023-03-19 DIAGNOSIS — M25651 Stiffness of right hip, not elsewhere classified: Secondary | ICD-10-CM | POA: Diagnosis not present

## 2023-03-19 DIAGNOSIS — R262 Difficulty in walking, not elsewhere classified: Secondary | ICD-10-CM | POA: Diagnosis not present

## 2023-03-19 DIAGNOSIS — G8929 Other chronic pain: Secondary | ICD-10-CM | POA: Diagnosis not present

## 2023-03-19 DIAGNOSIS — R2689 Other abnormalities of gait and mobility: Secondary | ICD-10-CM

## 2023-03-19 DIAGNOSIS — M6281 Muscle weakness (generalized): Secondary | ICD-10-CM

## 2023-03-19 DIAGNOSIS — M545 Low back pain, unspecified: Secondary | ICD-10-CM | POA: Diagnosis not present

## 2023-03-19 NOTE — Therapy (Signed)
OUTPATIENT PHYSICAL THERAPY THORACOLUMBAR EVALUATION   Patient Name: Destiny Romero MRN: [id number] DOB:March 09, 1951, 72 y.o., female Today's Date: 03/19/2023  END OF SESSION:  PT End of Session - 03/19/23 0849     Visit Number 3    Date for PT Re-Evaluation 05/19/23    PT Start Time 0841    PT Stop Time 0922    PT Time Calculation (min) 41 min    Activity Tolerance Patient limited by pain    Behavior During Therapy [facility] for tasks assessed/performed             Past Medical History:  Diagnosis Date   Allergy    Anal lesion 2007   Anemia    Anesthesia complication    Per pt/ sensitive to sedation!   Arthritis    fingers,toes   Cancer (HCC)    "PRECANCEROUS LESIONS IN RECTUM"   Cataract    bilateral,removed   Central retinal artery occlusion 06/08/2016   limited vision right eye.   Cervical dysplasia 04/1991   Dr Laurena Bering - CIN I, cone and freeze in 1990s with normal paps since   Constipation 06/08/2016   moves bowels every 3 rd day    Decreased visual acuity 06/08/2016   Retinal bleed right eye 2017   Depression    Diabetes mellitus without complication (HCC)    Fatigue 06/27/2017   H/O measles    H/O mumps    History of chicken pox    Hyperlipidemia    Mitral valve prolapse    NO MR; no SBE prophylaxis needed   Osteoporosis    PONV (postoperative nausea and vomiting)    Pre-diabetes    highest A1c 6.4 %   Preventative health care 12/21/2016   Squamous cell carcinoma in situ of skin    Dr Hortense Ramal   STD (sexually transmitted disease)    HSV   Vision impairment    limited vision in right eye.   Past Surgical History:  Procedure Laterality Date   CARPAL TUNNEL RELEASE Right 05/2020   CATARACT EXTRACTION, BILATERAL Bilateral    cataracts   CERVICAL CONE BIOPSY     COLONOSCOPY  2003   COLONOSCOPY  04/01/2011   [place] - repeat in 5 years   COLONOSCOPY W/ POLYPECTOMY  2007   Dr Kinnie Scales   EXCISION MORTON'S NEUROMA Right    EYE SURGERY     07/02/22,  05/31/22   LAMINECTOMY     1982   LUMBAR LAMINECTOMY Left 11/2022   L4-L5   OOPHORECTOMY Left 12/2011   Dr Nicholas Lose ; ovarian cyst   POLYPECTOMY     RECTAL SURGERY  05/28/2006   pre-canerous lesion removed in 2007; Dr Byrd Hesselbach, [place]   TUBAL LIGATION     Patient Active Problem List   Diagnosis Date Noted   Viral URI with cough 02/26/2023   Chronic kidney disease, stage 3a (HCC) 01/27/2023   H/O vitrectomy 07/02/2022   Epiretinal membrane, right eye 06/02/2022   Macular pucker, right eye 05/28/2022   HSV-2 (herpes simplex virus 2) infection 04/09/2022   Chronic rhinitis 04/09/2022   Posttraumatic stress disorder 01/07/2022   Arthritis of hand 11/21/2021   History of premalignant rectal tumor 11/13/2021   Degenerative lumbar spinal stenosis 03/03/2021   Lumbar radiculopathy 01/22/2021   Osteoarthritis of both hands 11/10/2020   Carpal tunnel syndrome of left wrist 04/11/2020   Gastroesophageal reflux disease 11/27/2019   Atypical chest pain 11/07/2018   BMI 29.0-29.9,adult 11/07/2018   Dyspnea 11/07/2018  Dupuytren's contracture of both hands 05/30/2018   Constipation 06/08/2016   Decreased visual acuity 06/08/2016   Type 2 diabetes mellitus with retinopathy (HCC) 08/11/2013   Osteopenia 08/19/2012   Hyperlipidemia 06/27/2008   History of colonic polyps 06/27/2008   Recurrent major depressive disorder (HCC) 01/19/2007    PCP: Loyola Mast, MD  REFERRING PROVIDER: Arman Bogus, MD  REFERRING DIAG: M54.16 (ICD-10-CM) - Radiculopathy, lumbar region   Rationale for Evaluation and Treatment: Rehabilitation  THERAPY DIAG:  Muscle weakness (generalized)  Pain in right hip  Chronic low back pain, unspecified back pain laterality, unspecified whether sciatica present  Muscle spasm of back  Other abnormalities of gait and mobility  Difficulty in walking, not elsewhere classified  Stiffness of right hip, not elsewhere classified  ONSET DATE:  03/03/23  SUBJECTIVE:                                                                                                                                                                                           SUBJECTIVE STATEMENT: Patient reports that she had R groin pain after her last treatment. Still able to walk.  PERTINENT HISTORY:  Per referring Physician note in April:  doing really well from spine standpoint, no real back pain, no leg pain or NTW, still has R groin pain with wt bearing, was told at [facility] she has &#34;arthritis&#34; there. had xrays in Feb which show mild degen changes     PAIN:  Are you having pain? Yes: NPRS scale: 3/10 Pain location: R posterior hip, piriformis area. Pain description: aching Aggravating factors: standing and walking Relieving factors: A little bit of motion helps  PRECAUTIONS: Back  WEIGHT BEARING RESTRICTIONS: No  FALLS:  Has patient fallen in last 6 months? 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  PLOF: Independent  PATIENT GOALS: Wants to get by to walking and hiking, get into the gym  NEXT MD VISIT: No follow up scheduled  OBJECTIVE:   DIAGNOSTIC FINDINGS:  N/A  PATIENT SURVEYS:  FOTO 52  SCREENING FOR RED FLAGS: Bowel or bladder incontinence: No Spinal tumors: No Cauda equina syndrome: No Compression fracture: No Abdominal aneurysm: No  COGNITION: Overall cognitive status: Within functional limits for tasks assessed     SENSATION: WFL  MUSCLE LENGTH: Hamstrings: Right 70 deg; Left 75 deg Thomas test: Tight B hip flexors  POSTURE: No Significant postural limitations  PALPATION: TTP R QL, gluts, piriformis, ITB  LUMBAR ROM: Reports tightness at end  range with all motion, but no real pain.  AROM eval  Flexion To toes  Extension  100%  Right lateral flexion To knee  Left lateral flexion To knee  Right rotation 80%  Left rotation 80%   (Blank rows = not tested)  LOWER EXTREMITY ROM:   Generally WNL, some pain at end of range with R hip motion.   LOWER EXTREMITY MMT:  LLE 5/5, R limited by pain  MMT Right eval Left eval  Hip flexion 3+   Hip extension 3   Hip abduction 4-   Hip adduction    Hip internal rotation    Hip external rotation 3+   Knee flexion 4   Knee extension 4   Ankle dorsiflexion 4   Ankle plantarflexion    Ankle inversion    Ankle eversion       LUMBAR SPECIAL TESTS:  Straight leg raise test: Positive, Slump test: Positive, and Single leg stance test: Positive  FUNCTIONAL TESTS:  5 times sit to stand: 15.58  GAIT: Distance walked: In clinic distances Assistive device utilized: None Level of assistance: Complete Independence Comments: Mildly antalgic gait pattern with decrease WB through R hip.  TODAY'S TREATMENT:                                                                                                                              DATE:  03/19/23 NuStep L4 x 6 minutes Stretch to hip ext and ER Supine bridge x 10 Supine bridge over ball with B hip IR x 10. Bridge with ball roll R/L x 5. Alternately lower one leg to the mat and lift back while stabilizing ball with opposite leg, 10 reps each side. Seated mini squats with G tband at knees B side to side step with G tband at knees. Seated figure 4, HS, and glut stretch B  03/16/23 Bike L2 x6 min  S2S 2x10  Hamstring curls 25lb 2x10 Leg Ext 5lb 2x10 Rows green 2x10 Shoulder Ext green 2x10  Passive stretching to Hamstrings, Single K2C, piriformis, lower trunk rotations.  03/10/23   PATIENT EDUCATION:  Education details: POC Person educated: Patient Education method: Explanation Education comprehension: verbalized understanding  HOME EXERCISE PROGRAM: [id number]  ASSESSMENT:  CLINICAL IMPRESSION: Patient reports  increased R groin pain after last treatment. Most likely arthritic. She cannot identify if any specific exercise caused the pain. Treatment focused on establishing HEP, introducing some stretch and stabilization exer. She reported no pain while performing, but did have the groin spasm upon rising after treatment. Difficult to pinpoint.  OBJECTIVE IMPAIRMENTS: Abnormal gait, decreased activity tolerance, decreased balance, decreased coordination, difficulty walking, decreased ROM, decreased strength, impaired flexibility, postural dysfunction, and pain.   ACTIVITY LIMITATIONS: carrying, lifting, bending, standing, squatting, stairs, and locomotion level  PARTICIPATION LIMITATIONS: meal prep, cleaning, laundry, and community activity  PERSONAL FACTORS: Past/current experiences are also affecting patient's functional outcome.   REHAB POTENTIAL: Good  CLINICAL DECISION MAKING: Stable/uncomplicated  EVALUATION COMPLEXITY: Low   GOALS: Goals reviewed with patient? Yes  SHORT TERM GOALS: Target date: 03/24/23  I with intiial HEP Baseline: Goal status: INITIAL  LONG TERM GOALS: Target date: 9\11\24  I with final HEP Baseline:  Goal status: INITIAL  2.  Increase FOTO score to at least 58 Baseline: 52 Goal status: INITIAL  3.  Decrease 5x STS to < 12 sec Baseline: 15.4 Goal status: INITIAL  4.  Patient will walk on unlevel surfaces x at least 1000' with pain < 3/10 Baseline: Limited walking of any kind due to pain. Goal status: INITIAL  5.  Patient will report pain of < 3/10 with any of her normal daily activities, not having to stop and sit due to pain. Baseline: Limited activity, has to sit down after about 15 min of standing or walking. Goal status: INITIAL  6.  Patient will be able to get down to the floor and back up MI, with confidence. Baseline: Fearful to get down to the floor due to difficulty rising. Goal status: INITIAL  PLAN:  PT FREQUENCY: 1-2x/week  PT  DURATION: 10 weeks  PLANNED INTERVENTIONS: Therapeutic exercises, Therapeutic activity, Neuromuscular re-education, Balance training, Gait training, Patient/Family education, Self Care, Joint mobilization, Stair training, Dry Needling, Electrical stimulation, Spinal mobilization, Cryotherapy, Moist heat, Ionotophoresis 4mg /ml Dexamethasone, and Manual therapy.  PLAN FOR NEXT SESSION: hip flexor stretch, upper body 3 strength   Iona Beard, DPT 03/19/2023, 9:33 AM

## 2023-03-23 ENCOUNTER — Other Ambulatory Visit: Payer: Medicare HMO

## 2023-03-23 ENCOUNTER — Encounter: Payer: Self-pay | Admitting: Physical Therapy

## 2023-03-23 ENCOUNTER — Other Ambulatory Visit: Payer: Medicare HMO | Admitting: Obstetrics and Gynecology

## 2023-03-23 ENCOUNTER — Ambulatory Visit: Payer: Medicare HMO | Admitting: Physical Therapy

## 2023-03-23 DIAGNOSIS — M545 Low back pain, unspecified: Secondary | ICD-10-CM | POA: Diagnosis not present

## 2023-03-23 DIAGNOSIS — M62830 Muscle spasm of back: Secondary | ICD-10-CM | POA: Diagnosis not present

## 2023-03-23 DIAGNOSIS — M25551 Pain in right hip: Secondary | ICD-10-CM

## 2023-03-23 DIAGNOSIS — R262 Difficulty in walking, not elsewhere classified: Secondary | ICD-10-CM | POA: Diagnosis not present

## 2023-03-23 DIAGNOSIS — R2689 Other abnormalities of gait and mobility: Secondary | ICD-10-CM | POA: Diagnosis not present

## 2023-03-23 DIAGNOSIS — M6281 Muscle weakness (generalized): Secondary | ICD-10-CM

## 2023-03-23 DIAGNOSIS — G8929 Other chronic pain: Secondary | ICD-10-CM | POA: Diagnosis not present

## 2023-03-23 DIAGNOSIS — M25651 Stiffness of right hip, not elsewhere classified: Secondary | ICD-10-CM | POA: Diagnosis not present

## 2023-03-23 NOTE — Therapy (Signed)
OUTPATIENT PHYSICAL THERAPY THORACOLUMBAR EVALUATION   Patient Name: Destiny Romero MRN: [id number] DOB:06/15/1951, 72 y.o., female Today's Date: 03/23/2023  END OF SESSION:  PT End of Session - 03/23/23 0804     Visit Number 4    Date for PT Re-Evaluation 05/19/23    PT Start Time 0800    PT Stop Time 0845    PT Time Calculation (min) 45 min    Activity Tolerance Patient tolerated treatment well    Behavior During Therapy [facility] for tasks assessed/performed             Past Medical History:  Diagnosis Date   Allergy    Anal lesion 2007   Anemia    Anesthesia complication    Per pt/ sensitive to sedation!   Arthritis    fingers,toes   Cancer (HCC)    "PRECANCEROUS LESIONS IN RECTUM"   Cataract    bilateral,removed   Central retinal artery occlusion 06/08/2016   limited vision right eye.   Cervical dysplasia 04/1991   Dr Laurena Bering - CIN I, cone and freeze in 1990s with normal paps since   Constipation 06/08/2016   moves bowels every 3 rd day    Decreased visual acuity 06/08/2016   Retinal bleed right eye 2017   Depression    Diabetes mellitus without complication (HCC)    Fatigue 06/27/2017   H/O measles    H/O mumps    History of chicken pox    Hyperlipidemia    Mitral valve prolapse    NO MR; no SBE prophylaxis needed   Osteoporosis    PONV (postoperative nausea and vomiting)    Pre-diabetes    highest A1c 6.4 %   Preventative health care 12/21/2016   Squamous cell carcinoma in situ of skin    Dr Hortense Ramal   STD (sexually transmitted disease)    HSV   Vision impairment    limited vision in right eye.   Past Surgical History:  Procedure Laterality Date   CARPAL TUNNEL RELEASE Right 05/2020   CATARACT EXTRACTION, BILATERAL Bilateral    cataracts   CERVICAL CONE BIOPSY     COLONOSCOPY  2003   COLONOSCOPY  04/01/2011   [place] - repeat in 5 years   COLONOSCOPY W/ POLYPECTOMY  2007   Dr Kinnie Scales   EXCISION MORTON'S NEUROMA Right    EYE SURGERY      07/02/22, 05/31/22   LAMINECTOMY     1982   LUMBAR LAMINECTOMY Left 11/2022   L4-L5   OOPHORECTOMY Left 12/2011   Dr Nicholas Lose ; ovarian cyst   POLYPECTOMY     RECTAL SURGERY  05/28/2006   pre-canerous lesion removed in 2007; Dr Byrd Hesselbach, [place]   TUBAL LIGATION     Patient Active Problem List   Diagnosis Date Noted   Viral URI with cough 02/26/2023   Chronic kidney disease, stage 3a (HCC) 01/27/2023   H/O vitrectomy 07/02/2022   Epiretinal membrane, right eye 06/02/2022   Macular pucker, right eye 05/28/2022   HSV-2 (herpes simplex virus 2) infection 04/09/2022   Chronic rhinitis 04/09/2022   Posttraumatic stress disorder 01/07/2022   Arthritis of hand 11/21/2021   History of premalignant rectal tumor 11/13/2021   Degenerative lumbar spinal stenosis 03/03/2021   Lumbar radiculopathy 01/22/2021   Osteoarthritis of both hands 11/10/2020   Carpal tunnel syndrome of left wrist 04/11/2020   Gastroesophageal reflux disease 11/27/2019   Atypical chest pain 11/07/2018   BMI 29.0-29.9,adult 11/07/2018   Dyspnea 11/07/2018  Dupuytren's contracture of both hands 05/30/2018   Constipation 06/08/2016   Decreased visual acuity 06/08/2016   Type 2 diabetes mellitus with retinopathy (HCC) 08/11/2013   Osteopenia 08/19/2012   Hyperlipidemia 06/27/2008   History of colonic polyps 06/27/2008   Recurrent major depressive disorder (HCC) 01/19/2007    PCP: Loyola Mast, MD  REFERRING PROVIDER: Arman Bogus, MD  REFERRING DIAG: M54.16 (ICD-10-CM) - Radiculopathy, lumbar region   Rationale for Evaluation and Treatment: Rehabilitation  THERAPY DIAG:  Muscle weakness (generalized)  Pain in right hip  Chronic low back pain, unspecified back pain laterality, unspecified whether sciatica present  ONSET DATE: 03/03/23  SUBJECTIVE:                                                                                                                                                                                            SUBJECTIVE STATEMENT: "A bit rusty"  PERTINENT HISTORY:  Per referring Physician note in April:  doing really well from spine standpoint, no real back pain, no leg pain or NTW, still has R groin pain with wt bearing, was told at [facility] she has &#34;arthritis&#34; there. had xrays in Feb which show mild degen changes     PAIN:  Are you having pain? Yes: NPRS scale: 3/10 Pain location: R posterior hip, piriformis area. Pain description: aching Aggravating factors: standing and walking Relieving factors: A little bit of motion helps  PRECAUTIONS: Back  WEIGHT BEARING RESTRICTIONS: No  FALLS:  Has patient fallen in last 6 months? 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  PLOF: Independent  PATIENT GOALS: Wants to get by to walking and hiking, get into the gym  NEXT MD VISIT: No follow up scheduled  OBJECTIVE:   DIAGNOSTIC FINDINGS:  N/A  PATIENT SURVEYS:  FOTO 52  SCREENING FOR RED FLAGS: Bowel or bladder incontinence: No Spinal tumors: No Cauda equina syndrome: No Compression fracture: No Abdominal aneurysm: No  COGNITION: Overall cognitive status: Within functional limits for tasks assessed     SENSATION: WFL  MUSCLE LENGTH: Hamstrings: Right 70 deg; Left 75 deg Thomas test: Tight B hip flexors  POSTURE: No Significant postural limitations  PALPATION: TTP R QL, gluts, piriformis, ITB  LUMBAR ROM: Reports tightness at end range with all motion, but no real pain.  AROM eval  Flexion To toes  Extension 100%  Right lateral flexion To knee  Left lateral flexion To knee  Right rotation 80%  Left rotation 80%   (  Blank rows = not tested)  LOWER EXTREMITY ROM:   Generally WNL, some pain at end of range with R hip motion.   LOWER EXTREMITY MMT:  LLE 5/5,  R limited by pain  MMT Right eval Left eval  Hip flexion 3+   Hip extension 3   Hip abduction 4-   Hip adduction    Hip internal rotation    Hip external rotation 3+   Knee flexion 4   Knee extension 4   Ankle dorsiflexion 4   Ankle plantarflexion    Ankle inversion    Ankle eversion       LUMBAR SPECIAL TESTS:  Straight leg raise test: Positive, Slump test: Positive, and Single leg stance test: Positive  FUNCTIONAL TESTS:  5 times sit to stand: 15.58  GAIT: Distance walked: In clinic distances Assistive device utilized: None Level of assistance: Complete Independence Comments: Mildly antalgic gait pattern with decrease WB through R hip.  TODAY'S TREATMENT:                                                                                                                              DATE:  03/23/23 NuStep L5 x 6 minutes R hip PROM with end range holds  Supine bridges Hooklying march w/ ab set LE on Pball bridges, K2C, Oblq Supine hip add x10 Ball squeeze bridge x10 stopped after 6 reps due to pain ITB stretch  HS curls 20lb 2x10 Leg Ext 5ln 2x10   03/19/23 NuStep L4 x 6 minutes Stretch to hip ext and ER Supine bridge x 10 Supine bridge over ball with B hip IR x 10. Bridge with ball roll R/L x 5. Alternately lower one leg to the mat and lift back while stabilizing ball with opposite leg, 10 reps each side. Seated mini squats with G tband at knees B side to side step with G tband at knees. Seated figure 4, HS, and glut stretch B  03/16/23 Bike L2 x6 min  S2S 2x10  Hamstring curls 25lb 2x10 Leg Ext 5lb 2x10 Rows green 2x10 Shoulder Ext green 2x10  Passive stretching to Hamstrings, Single K2C, piriformis, lower trunk rotations.  03/10/23   PATIENT EDUCATION:  Education details: POC Person educated: Patient Education method: Explanation Education comprehension: verbalized understanding  HOME EXERCISE PROGRAM: [id number]  ASSESSMENT:  CLINICAL IMPRESSION:   Treatment focused on R hips stretching and strengthening. She reported pain while performing supine bridges initially that went away. Then pain with ball squeeze and bridges but had to stp interventions.Some tenderness with palpation over L GT. No issue HS curls and extension. Some pain with R hip PROM but is was not consistent with movement.   OBJECTIVE IMPAIRMENTS: Abnormal gait, decreased activity tolerance, decreased balance, decreased coordination, difficulty walking, decreased ROM, decreased strength, impaired flexibility, postural dysfunction, and pain.   ACTIVITY LIMITATIONS: carrying, lifting, bending, standing, squatting, stairs, and locomotion level  PARTICIPATION LIMITATIONS: meal prep, cleaning, laundry, and community activity  PERSONAL FACTORS: Past/current experiences are also affecting patient's functional outcome.   REHAB POTENTIAL: Good  CLINICAL DECISION MAKING: Stable/uncomplicated  EVALUATION COMPLEXITY: Low   GOALS: Goals reviewed with patient? Yes  SHORT TERM GOALS: Target date: 03/24/23  I with intiial HEP Baseline: Goal status: INITIAL  LONG TERM GOALS: Target date: 9\11\24  I with final HEP Baseline:  Goal status: INITIAL  2.  Increase FOTO score to at least 58 Baseline: 52 Goal status: INITIAL  3.  Decrease 5x STS to < 12 sec Baseline: 15.4 Goal status: INITIAL  4.  Patient will walk on unlevel surfaces x at least 1000' with pain < 3/10 Baseline: Limited walking of any kind due to pain. Goal status: INITIAL  5.  Patient will report pain of < 3/10 with any of her normal daily activities, not having to stop and sit due to pain. Baseline: Limited activity, has to sit down after about 15 min of standing or walking. Goal status: INITIAL  6.  Patient will be able to get down to the floor and back up MI, with confidence. Baseline: Fearful to get down to the floor due to difficulty rising. Goal status: INITIAL  PLAN:  PT FREQUENCY:  1-2x/week  PT DURATION: 10 weeks  PLANNED INTERVENTIONS: Therapeutic exercises, Therapeutic activity, Neuromuscular re-education, Balance training, Gait training, Patient/Family education, Self Care, Joint mobilization, Stair training, Dry Needling, Electrical stimulation, Spinal mobilization, Cryotherapy, Moist heat, Ionotophoresis 4mg /ml Dexamethasone, and Manual therapy.  PLAN FOR NEXT SESSION: hip flexor stretch, upper body 3 strength   Iona Beard, DPT 03/23/2023, 8:04 AM

## 2023-03-26 ENCOUNTER — Encounter: Payer: Self-pay | Admitting: Physical Therapy

## 2023-03-26 ENCOUNTER — Ambulatory Visit: Payer: Medicare HMO | Admitting: Physical Therapy

## 2023-03-26 DIAGNOSIS — R262 Difficulty in walking, not elsewhere classified: Secondary | ICD-10-CM | POA: Diagnosis not present

## 2023-03-26 DIAGNOSIS — M6281 Muscle weakness (generalized): Secondary | ICD-10-CM | POA: Diagnosis not present

## 2023-03-26 DIAGNOSIS — M545 Low back pain, unspecified: Secondary | ICD-10-CM

## 2023-03-26 DIAGNOSIS — G8929 Other chronic pain: Secondary | ICD-10-CM | POA: Diagnosis not present

## 2023-03-26 DIAGNOSIS — M62830 Muscle spasm of back: Secondary | ICD-10-CM | POA: Diagnosis not present

## 2023-03-26 DIAGNOSIS — M25651 Stiffness of right hip, not elsewhere classified: Secondary | ICD-10-CM | POA: Diagnosis not present

## 2023-03-26 DIAGNOSIS — R2689 Other abnormalities of gait and mobility: Secondary | ICD-10-CM | POA: Diagnosis not present

## 2023-03-26 DIAGNOSIS — M25551 Pain in right hip: Secondary | ICD-10-CM | POA: Diagnosis not present

## 2023-03-26 NOTE — Therapy (Signed)
OUTPATIENT PHYSICAL THERAPY THORACOLUMBAR EVALUATION   Patient Name: Destiny Romero MRN: [id number] DOB:1951/03/10, 72 y.o., female Today's Date: 03/26/2023  END OF SESSION:  PT End of Session - 03/26/23 0843     Visit Number 5    Date for PT Re-Evaluation 05/19/23    PT Start Time 0842    PT Stop Time 0920    PT Time Calculation (min) 38 min    Activity Tolerance Patient tolerated treatment well    Behavior During Therapy [facility] for tasks assessed/performed              Past Medical History:  Diagnosis Date   Allergy    Anal lesion 2007   Anemia    Anesthesia complication    Per pt/ sensitive to sedation!   Arthritis    fingers,toes   Cancer (HCC)    "PRECANCEROUS LESIONS IN RECTUM"   Cataract    bilateral,removed   Central retinal artery occlusion 06/08/2016   limited vision right eye.   Cervical dysplasia 04/1991   Dr Laurena Bering - CIN I, cone and freeze in 1990s with normal paps since   Constipation 06/08/2016   moves bowels every 3 rd day    Decreased visual acuity 06/08/2016   Retinal bleed right eye 2017   Depression    Diabetes mellitus without complication (HCC)    Fatigue 06/27/2017   H/O measles    H/O mumps    History of chicken pox    Hyperlipidemia    Mitral valve prolapse    NO MR; no SBE prophylaxis needed   Osteoporosis    PONV (postoperative nausea and vomiting)    Pre-diabetes    highest A1c 6.4 %   Preventative health care 12/21/2016   Squamous cell carcinoma in situ of skin    Dr Hortense Ramal   STD (sexually transmitted disease)    HSV   Vision impairment    limited vision in right eye.   Past Surgical History:  Procedure Laterality Date   CARPAL TUNNEL RELEASE Right 05/2020   CATARACT EXTRACTION, BILATERAL Bilateral    cataracts   CERVICAL CONE BIOPSY     COLONOSCOPY  2003   COLONOSCOPY  04/01/2011   [place] - repeat in 5 years   COLONOSCOPY W/ POLYPECTOMY  2007   Dr Kinnie Scales   EXCISION MORTON'S NEUROMA Right    EYE SURGERY      07/02/22, 05/31/22   LAMINECTOMY     1982   LUMBAR LAMINECTOMY Left 11/2022   L4-L5   OOPHORECTOMY Left 12/2011   Dr Nicholas Lose ; ovarian cyst   POLYPECTOMY     RECTAL SURGERY  05/28/2006   pre-canerous lesion removed in 2007; Dr Byrd Hesselbach, [place]   TUBAL LIGATION     Patient Active Problem List   Diagnosis Date Noted   Viral URI with cough 02/26/2023   Chronic kidney disease, stage 3a (HCC) 01/27/2023   H/O vitrectomy 07/02/2022   Epiretinal membrane, right eye 06/02/2022   Macular pucker, right eye 05/28/2022   HSV-2 (herpes simplex virus 2) infection 04/09/2022   Chronic rhinitis 04/09/2022   Posttraumatic stress disorder 01/07/2022   Arthritis of hand 11/21/2021   History of premalignant rectal tumor 11/13/2021   Degenerative lumbar spinal stenosis 03/03/2021   Lumbar radiculopathy 01/22/2021   Osteoarthritis of both hands 11/10/2020   Carpal tunnel syndrome of left wrist 04/11/2020   Gastroesophageal reflux disease 11/27/2019   Atypical chest pain 11/07/2018   BMI 29.0-29.9,adult 11/07/2018   Dyspnea 11/07/2018  Dupuytren's contracture of both hands 05/30/2018   Constipation 06/08/2016   Decreased visual acuity 06/08/2016   Type 2 diabetes mellitus with retinopathy (HCC) 08/11/2013   Osteopenia 08/19/2012   Hyperlipidemia 06/27/2008   History of colonic polyps 06/27/2008   Recurrent major depressive disorder (HCC) 01/19/2007    PCP: Loyola Mast, MD  REFERRING PROVIDER: Arman Bogus, MD  REFERRING DIAG: M54.16 (ICD-10-CM) - Radiculopathy, lumbar region   Rationale for Evaluation and Treatment: Rehabilitation  THERAPY DIAG:  Muscle weakness (generalized)  Pain in right hip  Chronic low back pain, unspecified back pain laterality, unspecified whether sciatica present  Difficulty in walking, not elsewhere classified  Muscle spasm of back  Stiffness of right hip, not elsewhere classified  ONSET DATE: 03/03/23  SUBJECTIVE:                                                                                                                                                                                            SUBJECTIVE STATEMENT: Patient reports no change in the groin pain, it catches her after she has been sitting for a while. She also continues to report pain in her R lower back, just above iliac crest.  PERTINENT HISTORY:  Per referring Physician note in April:  doing really well from spine standpoint, no real back pain, no leg pain or NTW, still has R groin pain with wt bearing, was told at [facility] she has &#34;arthritis&#34; there. had xrays in Feb which show mild degen changes   PAIN:  Are you having pain? Yes: NPRS scale: 3/10 Pain location: R posterior hip, piriformis area. Pain description: aching Aggravating factors: standing and walking Relieving factors: A little bit of motion helps  PRECAUTIONS: Back  WEIGHT BEARING RESTRICTIONS: No  FALLS:  Has patient fallen in last 6 months? 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  PLOF: Independent  PATIENT GOALS: Wants to get by to walking and hiking, get into the gym  NEXT MD VISIT: No follow up scheduled  OBJECTIVE:   DIAGNOSTIC FINDINGS:  N/A  PATIENT SURVEYS:  FOTO 52  SCREENING FOR RED FLAGS: Bowel or bladder incontinence: No Spinal tumors: No Cauda equina syndrome: No Compression fracture: No Abdominal aneurysm: No  COGNITION: Overall cognitive status: Within functional limits for tasks assessed     SENSATION: WFL  MUSCLE LENGTH: Hamstrings: Right 70 deg; Left 75 deg Thomas test: Tight B hip flexors  POSTURE: No Significant postural limitations  PALPATION: TTP R QL, gluts,  piriformis, ITB  LUMBAR ROM: Reports tightness at end range with all motion, but no real pain.  AROM eval   Flexion To toes  Extension 100%  Right lateral flexion To knee  Left lateral flexion To knee  Right rotation 80%  Left rotation 80%   (Blank rows = not tested)  LOWER EXTREMITY ROM:   Generally WNL, some pain at end of range with R hip motion.   LOWER EXTREMITY MMT:  LLE 5/5, R limited by pain  MMT Right eval Left eval  Hip flexion 3+   Hip extension 3   Hip abduction 4-   Hip adduction    Hip internal rotation    Hip external rotation 3+   Knee flexion 4   Knee extension 4   Ankle dorsiflexion 4   Ankle plantarflexion    Ankle inversion    Ankle eversion       LUMBAR SPECIAL TESTS:  Straight leg raise test: Positive, Slump test: Positive, and Single leg stance test: Positive  FUNCTIONAL TESTS:  5 times sit to stand: 15.58  GAIT: Distance walked: In clinic distances Assistive device utilized: None Level of assistance: Complete Independence Comments: Mildly antalgic gait pattern with decrease WB through R hip.  TODAY'S TREATMENT:                                                                                                                              DATE:  03/26/23 NuStep L5 x 6 minutes STM to R ITB and piriformis Supine hip stretch, figure 4, ITB, gluts, 3 way HS stretch with strap Hip stabilization exercises in supine, ball squeeze, clamshell, bridge with hip abd against G tband, and bridge with ball squeeze, 10 each  03/23/23 NuStep L5 x 6 minutes R hip PROM with end range holds  Supine bridges Hooklying march w/ ab set LE on Pball bridges, K2C, Oblq Supine hip add x10 Ball squeeze bridge x10 stopped after 6 reps due to pain ITB stretch  HS curls 20lb 2x10 Leg Ext 5ln 2x10   03/19/23 NuStep L4 x 6 minutes Stretch to hip ext and ER Supine bridge x 10 Supine bridge over ball with B hip IR x 10. Bridge with ball roll R/L x 5. Alternately lower one leg to the mat and lift back while stabilizing ball with opposite leg, 10 reps each side. Seated mini  squats with G tband at knees B side to side step with G tband at knees. Seated figure 4, HS, and glut stretch B  03/16/23 Bike L2 x6 min  S2S 2x10  Hamstring curls 25lb 2x10 Leg Ext 5lb 2x10 Rows green 2x10 Shoulder Ext green 2x10  Passive stretching to Hamstrings, Single K2C, piriformis, lower trunk rotations.  03/10/23   PATIENT EDUCATION:  Education details: POC Person educated: Patient Education method: Explanation Education comprehension: verbalized understanding  HOME EXERCISE PROGRAM: [id number]  ASSESSMENT:  CLINICAL IMPRESSION:  Treatment focused on R hips stretching and strengthening.  She reports no overall change yet, but knows it may take some time to build more hip stability.  OBJECTIVE IMPAIRMENTS: Abnormal gait, decreased activity tolerance, decreased balance, decreased coordination, difficulty walking, decreased ROM, decreased strength, impaired flexibility, postural dysfunction, and pain.   ACTIVITY LIMITATIONS: carrying, lifting, bending, standing, squatting, stairs, and locomotion level  PARTICIPATION LIMITATIONS: meal prep, cleaning, laundry, and community activity  PERSONAL FACTORS: Past/current experiences are also affecting patient's functional outcome.   REHAB POTENTIAL: Good  CLINICAL DECISION MAKING: Stable/uncomplicated  EVALUATION COMPLEXITY: Low   GOALS: Goals reviewed with patient? Yes  SHORT TERM GOALS: Target date: 03/24/23  I with intiial HEP Baseline: Goal status: 03/26/23-met  LONG TERM GOALS: Target date: 9\11\24  I with final HEP Baseline:  Goal status: INITIAL  2.  Increase FOTO score to at least 58 Baseline: 52 Goal status: INITIAL  3.  Decrease 5x STS to < 12 sec Baseline: 15.4 Goal status: INITIAL  4.  Patient will walk on unlevel surfaces x at least 1000' with pain < 3/10 Baseline: Limited walking of any kind due to pain. Goal status: INITIAL  5.  Patient will report pain of < 3/10 with any of her normal daily  activities, not having to stop and sit due to pain. Baseline: Limited activity, has to sit down after about 15 min of standing or walking. Goal status: INITIAL  6.  Patient will be able to get down to the floor and back up MI, with confidence. Baseline: Fearful to get down to the floor due to difficulty rising. Goal status: INITIAL  PLAN:  PT FREQUENCY: 1-2x/week  PT DURATION: 10 weeks  PLANNED INTERVENTIONS: Therapeutic exercises, Therapeutic activity, Neuromuscular re-education, Balance training, Gait training, Patient/Family education, Self Care, Joint mobilization, Stair training, Dry Needling, Electrical stimulation, Spinal mobilization, Cryotherapy, Moist heat, Ionotophoresis 4mg /ml Dexamethasone, and Manual therapy.  PLAN FOR NEXT SESSION: 03/26/23- SI assessment?  Iona Beard, DPT 03/26/2023, 9:20 AM

## 2023-03-29 ENCOUNTER — Ambulatory Visit (INDEPENDENT_AMBULATORY_CARE_PROVIDER_SITE_OTHER): Payer: Medicare HMO | Admitting: Family Medicine

## 2023-03-29 ENCOUNTER — Encounter: Payer: Self-pay | Admitting: Family Medicine

## 2023-03-29 VITALS — BP 136/74 | HR 53 | Temp 98.1°F | Ht 63.5 in | Wt 172.2 lb

## 2023-03-29 DIAGNOSIS — M1611 Unilateral primary osteoarthritis, right hip: Secondary | ICD-10-CM | POA: Diagnosis not present

## 2023-03-29 DIAGNOSIS — Z7984 Long term (current) use of oral hypoglycemic drugs: Secondary | ICD-10-CM

## 2023-03-29 DIAGNOSIS — E782 Mixed hyperlipidemia: Secondary | ICD-10-CM

## 2023-03-29 DIAGNOSIS — N1831 Chronic kidney disease, stage 3a: Secondary | ICD-10-CM | POA: Diagnosis not present

## 2023-03-29 DIAGNOSIS — E11319 Type 2 diabetes mellitus with unspecified diabetic retinopathy without macular edema: Secondary | ICD-10-CM | POA: Diagnosis not present

## 2023-03-29 LAB — CBC
HCT: 37.4 % (ref 36.0–46.0)
Hemoglobin: 12 g/dL (ref 12.0–15.0)
MCHC: 32.1 g/dL (ref 30.0–36.0)
MCV: 90.8 fl (ref 78.0–100.0)
Platelets: 245 10*3/uL (ref 150.0–400.0)
RBC: 4.12 Mil/uL (ref 3.87–5.11)
RDW: 14.4 % (ref 11.5–15.5)
WBC: 6.1 10*3/uL (ref 4.0–10.5)

## 2023-03-29 LAB — LIPID PANEL
Cholesterol: 176 mg/dL (ref 0–200)
HDL: 77.3 mg/dL (ref >39.00)
LDL Cholesterol: 83 mg/dL (ref 0–99)
NonHDL: 99.19
Total CHOL/HDL Ratio: 2
Triglycerides: 83 mg/dL (ref 0.0–149.0)
VLDL: 16.6 mg/dL (ref 0.0–40.0)

## 2023-03-29 LAB — MICROALBUMIN / CREATININE URINE RATIO
Creatinine,U: 48.9 mg/dL
Microalb Creat Ratio: 1.4 mg/g (ref 0.0–30.0)
Microalb, Ur: <0.7 mg/dL (ref 0.0–1.9)

## 2023-03-29 LAB — BASIC METABOLIC PANEL
BUN: 22 mg/dL (ref 6–23)
CO2: 27 mEq/L (ref 19–32)
Calcium: 9.5 mg/dL (ref 8.4–10.5)
Chloride: 106 mEq/L (ref 96–112)
Creatinine, Ser: 1.1 mg/dL (ref 0.40–1.20)
GFR: 49.81 mL/min — ABNORMAL LOW (ref >60.00)
Glucose, Bld: 89 mg/dL (ref 70–99)
Potassium: 4 mEq/L (ref 3.5–5.1)
Sodium: 143 mEq/L (ref 135–145)

## 2023-03-29 LAB — URINALYSIS, ROUTINE W REFLEX MICROSCOPIC
Bilirubin Urine: NEGATIVE
Hgb urine dipstick: NEGATIVE
Ketones, ur: NEGATIVE
Leukocytes,Ua: NEGATIVE
Nitrite: NEGATIVE
RBC / HPF: NONE SEEN (ref 0–?)
Specific Gravity, Urine: 1.015 (ref 1.000–1.030)
Total Protein, Urine: NEGATIVE
Urine Glucose: NEGATIVE
Urobilinogen, UA: 0.2 (ref 0.0–1.0)
WBC, UA: NONE SEEN (ref 0–?)
pH: 7 (ref 5.0–8.0)

## 2023-03-29 LAB — HEMOGLOBIN A1C: Hgb A1c MFr Bld: 7.1 % — ABNORMAL HIGH (ref 4.6–6.5)

## 2023-03-29 LAB — PHOSPHORUS: Phosphorus: 4.7 mg/dL — ABNORMAL HIGH (ref 2.3–4.6)

## 2023-03-29 NOTE — Assessment & Plan Note (Addendum)
I will reassess lipids today. Continue rosuvastatin 5 mg daily.

## 2023-03-29 NOTE — Assessment & Plan Note (Signed)
Discussed continued use of Tylenol Arthritis for symptomatic management.

## 2023-03-29 NOTE — Assessment & Plan Note (Signed)
I will check her micral and eGFR today. Continue focus on blood pressure management and avoiding nephrotoxic meds.

## 2023-03-29 NOTE — Progress Notes (Signed)
[facility] [address] [place] [address] Dept: [phone] Dept Fax: [phone]  Chronic Care Office Visit  Subjective:    Patient ID: Destiny Romero, female    DOB: Nov 17, 1950, 72 y.o..   MRN: [id number]  Chief Complaint  Patient presents with   Medical Management of Chronic Issues    3 month f/u.  Fasting today.     History of Present Illness:  Patient is in today for reassessment of chronic medical issues.  Ms. Ronda has a history of type 2 diabetes. She is currently managed on glimepiride 1 mg bid and pioglitazone 30 mg daily. She has been seeing morning blood sugars around 80. She anticipates her A1c may be improved. She is trying to be more active and more aware fo her diet.  Ms. Larusso has a history of hyperlipidemia. She is managed on rosuvastatin 5 mg daily.  Ms. Hartsough has a history of osteoarthritis. She notes this has been esp. bothersome in her right hip. she finds, this does not bother her at rest, but that she has a catching in the hip when she is up moving around. Once she has been up a while, the pain decreases. She had been using meloxicam, but stopped this on advice form em related to her CKD. She is using Tylenol Arthritis 2 tabs bid and is managing.  Ms. Deviney has a history of CKD, Stage 3a. It has been stable over the past 9 years.  Past Medical History: Patient Active Problem List   Diagnosis Date Noted   Viral URI with cough 02/26/2023   Chronic kidney disease, stage 3a (HCC) 01/27/2023   H/O vitrectomy 07/02/2022   Epiretinal membrane, right eye 06/02/2022   Macular pucker, right eye 05/28/2022   HSV-2 (herpes simplex virus 2) infection 04/09/2022   Chronic rhinitis 04/09/2022   Posttraumatic stress disorder 01/07/2022   Arthritis of hand 11/21/2021   History of premalignant rectal tumor 11/13/2021   Degenerative lumbar spinal stenosis 03/03/2021   Lumbar radiculopathy 01/22/2021    Osteoarthritis of both hands 11/10/2020   Carpal tunnel syndrome of left wrist 04/11/2020   Gastroesophageal reflux disease 11/27/2019   Atypical chest pain 11/07/2018   BMI 29.0-29.9,adult 11/07/2018   Dyspnea 11/07/2018   Dupuytren's contracture of both hands 05/30/2018   Constipation 06/08/2016   Decreased visual acuity 06/08/2016   Type 2 diabetes mellitus with retinopathy (HCC) 08/11/2013   Osteopenia 08/19/2012   Hyperlipidemia 06/27/2008   History of colonic polyps 06/27/2008   Recurrent major depressive disorder (HCC) 01/19/2007   Past Surgical History:  Procedure Laterality Date   CARPAL TUNNEL RELEASE Right 05/2020   CATARACT EXTRACTION, BILATERAL Bilateral    cataracts   CERVICAL CONE BIOPSY     COLONOSCOPY  2003   COLONOSCOPY  04/01/2011   [place] - repeat in 5 years   COLONOSCOPY W/ POLYPECTOMY  2007   Dr Kinnie Scales   EXCISION MORTON'S NEUROMA Right    EYE SURGERY     07/02/22, 05/31/22   LAMINECTOMY     1982   LUMBAR LAMINECTOMY Left 11/2022   L4-L5   OOPHORECTOMY Left 12/2011   Dr Nicholas Lose ; ovarian cyst   POLYPECTOMY     RECTAL SURGERY  05/28/2006   pre-canerous lesion removed in 2007; Dr Byrd Hesselbach, [place]   TUBAL LIGATION     Family History  Problem Relation Age of Onset   Diabetes Mother    Dementia Mother    Prostate cancer Father  COPD Father    Heart disease Father        CAD - used nitroglycerin tablets   Diabetes Sister        obese   Thyroid cancer Sister    Cancer Brother        non Hodgkin's Lymphoma, leukemia   Diabetes Brother        not obese   Kidney disease Brother        Kidney failure   Diabetes Brother    Kidney failure Brother    Diabetes Maternal Aunt        X5 ; all IDDM   Heart disease Maternal Aunt        "enlarged heart"   Heart disease Maternal Uncle        "enlarged heart"   Diabetes Maternal Grandmother        IDDM   Diabetes Paternal Grandmother        IDDM   Heart disease Paternal Grandmother    Heart disease  Paternal Grandfather        CAD - used nitroglycerin tablets   Colon cancer Neg Hx    Esophageal cancer Neg Hx    Stomach cancer Neg Hx    Stroke Neg Hx    Colon polyps Neg Hx    Rectal cancer Neg Hx    Outpatient Medications Prior to Visit  Medication Sig Dispense Refill   azelastine (ASTELIN) 0.1 % nasal spray Place 1 spray into both nostrils 2 (two) times daily. Use in each nostril as directed 30 mL 12   Calcium 200 MG TABS Take by mouth. Tale [phone] mg daily     cetirizine (ZYRTEC) 10 MG tablet Take by mouth.     Cholecalciferol (VITAMIN D) 2000 units CAPS daily.     estradiol (ESTRACE) 0.1 MG/GM vaginal cream Place 1 g vaginally 2 (two) times a week.     famotidine (PEPCID) 40 MG tablet TAKE 1 TABLET BY MOUTH EVERYDAY AT BEDTIME 90 tablet 3   glimepiride (AMARYL) 1 MG tablet Take 1 tablet (1 mg total) by mouth in the morning and at bedtime. 180 tablet 3   Glucosamine-Chondroitin 1500-1200 MG/30ML LIQD daily.     glucose blood (ONETOUCH VERIO) test strip Use as instructed 300 strip 12   lamoTRIgine (LAMICTAL) 200 MG tablet Take 200 mg by mouth daily.     levocetirizine (XYZAL ALLERGY 24HR) 5 MG tablet Take 5 mg by mouth every evening.     lidocaine (XYLOCAINE) 5 % ointment Apply 1 application. topically as needed. 30 g 0   Multiple Vitamin (MULTI VITAMIN) TABS daily.     omega-3 acid ethyl esters (LOVAZA) 1 g capsule Take by mouth daily.     OneTouch Delica Lancets 33G MISC Use to test blood sugar 3X daily.  Dx Code: E11.65 300 each 1   pioglitazone (ACTOS) 30 MG tablet Take 1 tablet (30 mg total) by mouth daily. 90 tablet 2   rosuvastatin (CRESTOR) 5 MG tablet Take 1 tablet (5 mg total) by mouth daily. 90 tablet 3   TURMERIC PO Take by mouth.     chlorpheniramine-HYDROcodone (TUSSIONEX) 10-8 MG/5ML Take 5 mLs by mouth every 12 (twelve) hours as needed for cough. 115 mL 0   No facility-administered medications prior to visit.   Allergies  Allergen Reactions   Cortisone  Palpitations, Other (See Comments) and Nausea And Vomiting    hyperactivity hyperactivity   Short Ragweed Pollen Ext Itching   Grass Pollen(K-O-R-T-Swt Vern) Rash  Other Other (See Comments), Hives, Itching and Nausea Only    Other reaction(s): Headache   Pseudoephedrine Other (See Comments)    tachycardia tachycardia   Atorvastatin Other (See Comments)    Low dose   Codeine    Fluoxetine Hcl Other (See Comments)   Metformin Other (See Comments)    04/15/15 caused dizziness   Metformin And Related     04/15/15 caused dizziness   Pramipexole     nightmares   Shellfish Allergy Other (See Comments)    [other]   Objective:   Today's Vitals   03/29/23 0859  BP: 136/74  Pulse: (!) 53  Temp: 98.1 F (36.7 C)  TempSrc: Temporal  SpO2: 98%  Weight: 172 lb 3.2 oz (78.1 kg)  Height: 5' 3.5" (1.613 m)   Body mass index is 30.03 kg/m.   General: Well developed, well nourished. No acute distress. Psych: Alert and oriented. Normal mood and affect.  Health Maintenance Due  Topic Date Due   DTaP/Tdap/Td (2 - Td or Tdap) 02/04/2023   Diabetic kidney evaluation - eGFR measurement  04/10/2023   Diabetic kidney evaluation - Urine ACR  04/10/2023     Assessment & Plan:   Problem List Items Addressed This Visit       Endocrine   Type 2 diabetes mellitus with retinopathy (HCC) - Primary    I will check annual DM labs today. Continue glimepiride 1 mg bid and pioglitazone 30 mg daily.      Relevant Orders   Lipid panel   Microalbumin / creatinine urine ratio   Basic metabolic panel   Hemoglobin A1c   Urinalysis, Routine w reflex microscopic     Musculoskeletal and Integument   Osteoarthritis of right hip    Discussed continued use of Tylenol Arthritis for symptomatic management.        Genitourinary   Chronic kidney disease, stage 3a (HCC)    I will check her micral and eGFR today. Continue focus on blood pressure management and avoiding nephrotoxic meds.      Relevant  Orders   Microalbumin / creatinine urine ratio   Basic metabolic panel   CBC   Phosphorus   Parathyroid hormone, intact (no Ca)     Other   Hyperlipidemia    I will reassess lipids today. Continue rosuvastatin 5 mg daily.      Relevant Orders   Lipid panel    Return in about 3 months (around 06/29/2023) for Reassessment.   Loyola Mast, MD

## 2023-03-29 NOTE — Assessment & Plan Note (Signed)
I will check annual DM labs today. Continue glimepiride 1 mg bid and pioglitazone 30 mg daily.

## 2023-03-30 LAB — PARATHYROID HORMONE, INTACT (NO CA): PTH: 23 pg/mL (ref 16–77)

## 2023-04-05 ENCOUNTER — Encounter: Payer: Self-pay | Admitting: Physical Therapy

## 2023-04-05 ENCOUNTER — Ambulatory Visit: Payer: Medicare HMO | Admitting: Physical Therapy

## 2023-04-05 DIAGNOSIS — G8929 Other chronic pain: Secondary | ICD-10-CM

## 2023-04-05 DIAGNOSIS — M25551 Pain in right hip: Secondary | ICD-10-CM | POA: Diagnosis not present

## 2023-04-05 DIAGNOSIS — R262 Difficulty in walking, not elsewhere classified: Secondary | ICD-10-CM

## 2023-04-05 DIAGNOSIS — M62830 Muscle spasm of back: Secondary | ICD-10-CM | POA: Diagnosis not present

## 2023-04-05 DIAGNOSIS — M6281 Muscle weakness (generalized): Secondary | ICD-10-CM | POA: Diagnosis not present

## 2023-04-05 DIAGNOSIS — M545 Low back pain, unspecified: Secondary | ICD-10-CM | POA: Diagnosis not present

## 2023-04-05 DIAGNOSIS — R2689 Other abnormalities of gait and mobility: Secondary | ICD-10-CM | POA: Diagnosis not present

## 2023-04-05 DIAGNOSIS — M25651 Stiffness of right hip, not elsewhere classified: Secondary | ICD-10-CM | POA: Diagnosis not present

## 2023-04-05 NOTE — Therapy (Signed)
OUTPATIENT PHYSICAL THERAPY THORACOLUMBAR EVALUATION   Patient Name: Destiny Romero MRN: [id number] DOB:[birth year], 72 y.o., female Today's Date: 04/05/2023  END OF SESSION:  PT End of Session - 04/05/23 0847     Visit Number 6    Date for PT Re-Evaluation 05/19/23    PT Start Time 0845    PT Stop Time 0930    PT Time Calculation (min) 45 min    Activity Tolerance Patient tolerated treatment well    Behavior During Therapy [facility] for tasks assessed/performed              Past Medical History:  Diagnosis Date   Allergy    Anal lesion 2007   Anemia    Anesthesia complication    Per pt/ sensitive to sedation!   Arthritis    fingers,toes   Cancer (HCC)    "PRECANCEROUS LESIONS IN RECTUM"   Cataract    bilateral,removed   Central retinal artery occlusion 06/08/2016   limited vision right eye.   Cervical dysplasia 04/1991   Dr Laurena Bering - CIN I, cone and freeze in 1990s with normal paps since   Constipation 06/08/2016   moves bowels every 3 rd day    Decreased visual acuity 06/08/2016   Retinal bleed right eye 2017   Depression    Diabetes mellitus without complication (HCC)    Fatigue 06/27/2017   H/O measles    H/O mumps    History of chicken pox    Hyperlipidemia    Mitral valve prolapse    NO MR; no SBE prophylaxis needed   Osteoporosis    PONV (postoperative nausea and vomiting)    Pre-diabetes    highest A1c 6.4 %   Preventative health care 12/21/2016   Squamous cell carcinoma in situ of skin    Dr Hortense Ramal   STD (sexually transmitted disease)    HSV   Vision impairment    limited vision in right eye.   Past Surgical History:  Procedure Laterality Date   CARPAL TUNNEL RELEASE Right 05/2020   CATARACT EXTRACTION, BILATERAL Bilateral    cataracts   CERVICAL CONE BIOPSY     COLONOSCOPY  2003   COLONOSCOPY  04/01/2011   [place] - repeat in 5 years   COLONOSCOPY W/ POLYPECTOMY  2007   Dr Kinnie Scales   EXCISION MORTON'S NEUROMA Right    EYE SURGERY      07/02/22, 05/31/22   LAMINECTOMY     1982   LUMBAR LAMINECTOMY Left 11/2022   L4-L5   OOPHORECTOMY Left 12/2011   Dr Nicholas Lose ; ovarian cyst   POLYPECTOMY     RECTAL SURGERY  05/28/2006   pre-canerous lesion removed in 2007; Dr Byrd Hesselbach, [place]   TUBAL LIGATION     Patient Active Problem List   Diagnosis Date Noted   Osteoarthritis of right hip 03/29/2023   Viral URI with cough 02/26/2023   Chronic kidney disease, stage 3a (HCC) 01/27/2023   H/O vitrectomy 07/02/2022   Epiretinal membrane, right eye 06/02/2022   Macular pucker, right eye 05/28/2022   HSV-2 (herpes simplex virus 2) infection 04/09/2022   Chronic rhinitis 04/09/2022   Posttraumatic stress disorder 01/07/2022   Arthritis of hand 11/21/2021   History of premalignant rectal tumor 11/13/2021   Degenerative lumbar spinal stenosis 03/03/2021   Lumbar radiculopathy 01/22/2021   Osteoarthritis of both hands 11/10/2020   Carpal tunnel syndrome of left wrist 04/11/2020   Gastroesophageal reflux disease 11/27/2019   Atypical chest pain 11/07/2018   BMI  29.0-29.9,adult 11/07/2018   Dyspnea 11/07/2018   Dupuytren's contracture of both hands 05/30/2018   Constipation 06/08/2016   Decreased visual acuity 06/08/2016   Type 2 diabetes mellitus with retinopathy (HCC) 08/11/2013   Osteopenia 08/19/2012   Hyperlipidemia 06/27/2008   History of colonic polyps 06/27/2008   Recurrent major depressive disorder (HCC) 01/19/2007    PCP: Loyola Mast, MD  REFERRING PROVIDER: Arman Bogus, MD  REFERRING DIAG: M54.16 (ICD-10-CM) - Radiculopathy, lumbar region   Rationale for Evaluation and Treatment: Rehabilitation  THERAPY DIAG:  Muscle weakness (generalized)  Chronic low back pain, unspecified back pain laterality, unspecified whether sciatica present  Pain in right hip  Difficulty in walking, not elsewhere classified  ONSET DATE: 03/03/23  SUBJECTIVE:                                                                                                                                                                                            SUBJECTIVE STATEMENT: "I don't know" Some days are better than others.   PERTINENT HISTORY:  Per referring Physician note in April:  doing really well from spine standpoint, no real back pain, no leg pain or NTW, still has R groin pain with wt bearing, was told at [facility] she has &#34;arthritis&#34; there. had xrays in Feb which show mild degen changes   PAIN:  Are you having pain? Yes: NPRS scale: 0/10 Pain location: R posterior hip, piriformis area. Pain description: aching Aggravating factors: Standing after prolong sitting Relieving factors: A little bit of motion helps  PRECAUTIONS: Back  WEIGHT BEARING RESTRICTIONS: No  FALLS:  Has patient fallen in last 6 months? 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  PLOF: Independent  PATIENT GOALS: Wants to get by to walking and hiking, get into the gym  NEXT MD VISIT: No follow up scheduled  OBJECTIVE:   DIAGNOSTIC FINDINGS:  N/A  PATIENT SURVEYS:  FOTO 52  SCREENING FOR RED FLAGS: Bowel or bladder incontinence: No Spinal tumors: No Cauda equina syndrome: No Compression fracture: No Abdominal aneurysm: No  COGNITION: Overall cognitive status: Within functional limits for tasks assessed     SENSATION: WFL  MUSCLE LENGTH: Hamstrings: Right 70 deg; Left 75 deg Thomas test: Tight B hip flexors  POSTURE: No Significant postural limitations  PALPATION: TTP R QL, gluts, piriformis, ITB  LUMBAR ROM: Reports tightness at end range with all motion, but no real pain.  AROM eval  Flexion To toes  Extension 100%  Right lateral flexion To knee  Left lateral flexion To knee  Right rotation 80%  Left rotation 80%   (Blank rows =  not tested)  LOWER EXTREMITY ROM:   Generally WNL, some pain at end of range with R hip motion.   LOWER EXTREMITY MMT:  LLE 5/5, R limited by pain  MMT Right eval Left eval  Hip flexion 3+   Hip extension 3   Hip abduction 4-   Hip adduction    Hip internal rotation    Hip external rotation 3+   Knee flexion 4   Knee extension 4   Ankle dorsiflexion 4   Ankle plantarflexion    Ankle inversion    Ankle eversion       LUMBAR SPECIAL TESTS:  Straight leg raise test: Positive, Slump test: Positive, and Single leg stance test: Positive  FUNCTIONAL TESTS:  5 times sit to stand: 15.58  GAIT: Distance walked: In clinic distances Assistive device utilized: None Level of assistance: Complete Independence Comments: Mildly antalgic gait pattern with decrease WB through R hip.  TODAY'S TREATMENT:                                                                                                                              DATE:  04/05/23 Bike L 2 x 5 min Resisted gait 30lb 4 way x 3 each Forward and lateral 6in step ups x 10 each  HS curls 35lb 2x10 Leg Ext 10lb 2x10 Seated hip abd blue 2x15 Seated HS stretch  03/26/23 NuStep L5 x 6 minutes STM to R ITB and piriformis Supine hip stretch, figure 4, ITB, gluts, 3 way HS stretch with strap Hip stabilization exercises in supine, ball squeeze, clamshell, bridge with hip abd against G tband, and bridge with ball squeeze, 10 each  03/23/23 NuStep L5 x 6 minutes R hip PROM with end range holds  Supine bridges Hooklying march w/ ab set LE on Pball bridges, K2C, Oblq Supine hip add x10 Ball squeeze bridge x10 stopped after 6 reps due to pain ITB stretch  HS curls 20lb 2x10 Leg Ext 5ln 2x10   03/19/23 NuStep L4 x 6 minutes Stretch to hip ext and ER Supine bridge x 10 Supine bridge over ball with B hip IR x 10. Bridge with ball roll R/L x 5. Alternately lower one leg to the mat and lift back while stabilizing ball with opposite  leg, 10 reps each side. Seated mini squats with G tband at knees B side to side step with G tband at knees. Seated figure 4, HS, and glut stretch B  03/16/23 Bike L2 x6 min  S2S 2x10  Hamstring curls 25lb 2x10 Leg Ext 5lb 2x10 Rows green 2x10 Shoulder Ext green 2x10  Passive stretching to Hamstrings, Single K2C, piriformis, lower trunk rotations.  03/10/23   PATIENT EDUCATION:  Education details: POC Person educated: Patient Education method: Explanation Education comprehension: verbalized understanding  HOME EXERCISE PROGRAM: [id number]  ASSESSMENT:  CLINICAL IMPRESSION:  Treatment focused on hip  strengthening utilizing mostly functional interventions. Hip fatigue reported with resisted adduction. Some scissoring of LE during the eccentric phase of resisted forward walking.  Cue to increase step length with lateral step ups. Some R adductor magnus tightness post session when pt was doing HS stretches.   OBJECTIVE IMPAIRMENTS: Abnormal gait, decreased activity tolerance, decreased balance, decreased coordination, difficulty walking, decreased ROM, decreased strength, impaired flexibility, postural dysfunction, and pain.   ACTIVITY LIMITATIONS: carrying, lifting, bending, standing, squatting, stairs, and locomotion level  PARTICIPATION LIMITATIONS: meal prep, cleaning, laundry, and community activity  PERSONAL FACTORS: Past/current experiences are also affecting patient's functional outcome.   REHAB POTENTIAL: Good  CLINICAL DECISION MAKING: Stable/uncomplicated  EVALUATION COMPLEXITY: Low   GOALS: Goals reviewed with patient? Yes  SHORT TERM GOALS: Target date: 03/24/23  I with intiial HEP Baseline: Goal status: 03/26/23-met  LONG TERM GOALS: Target date: 9\11\24  I with final HEP Baseline:  Goal status: INITIAL  2.  Increase FOTO score to at least 58 Baseline: 52 Goal status: INITIAL  3.  Decrease 5x STS to < 12 sec Baseline: 15.4 Goal status:  INITIAL  4.  Patient will walk on unlevel surfaces x at least 1000' with pain < 3/10 Baseline: Limited walking of any kind due to pain. Goal status: progressing 04/05/23  5.  Patient will report pain of < 3/10 with any of her normal daily activities, not having to stop and sit due to pain. Baseline: Limited activity, has to sit down after about 15 min of standing or walking. Goal status: INITIAL  6.  Patient will be able to get down to the floor and back up MI, with confidence. Baseline: Fearful to get down to the floor due to difficulty rising. Goal status: INITIAL  PLAN:  PT FREQUENCY: 1-2x/week  PT DURATION: 10 weeks  PLANNED INTERVENTIONS: Therapeutic exercises, Therapeutic activity, Neuromuscular re-education, Balance training, Gait training, Patient/Family education, Self Care, Joint mobilization, Stair training, Dry Needling, Electrical stimulation, Spinal mobilization, Cryotherapy, Moist heat, Ionotophoresis 4mg /ml Dexamethasone, and Manual therapy.  PLAN FOR NEXT SESSION: 03/26/23- SI assessment?  Debroah Baller, PTA 04/05/2023, 8:47 AM

## 2023-04-06 ENCOUNTER — Encounter: Payer: Self-pay | Admitting: Family

## 2023-04-06 ENCOUNTER — Ambulatory Visit (INDEPENDENT_AMBULATORY_CARE_PROVIDER_SITE_OTHER): Payer: Medicare HMO | Admitting: Family

## 2023-04-06 VITALS — BP 118/82 | HR 80 | Temp 97.9°F | Wt 168.8 lb

## 2023-04-06 DIAGNOSIS — R197 Diarrhea, unspecified: Secondary | ICD-10-CM | POA: Diagnosis not present

## 2023-04-06 DIAGNOSIS — A084 Viral intestinal infection, unspecified: Secondary | ICD-10-CM | POA: Diagnosis not present

## 2023-04-06 LAB — CBC WITH DIFFERENTIAL/PLATELET
Basophils Absolute: 0 10*3/uL (ref 0.0–0.1)
Basophils Relative: 0.6 % (ref 0.0–3.0)
Eosinophils Absolute: 0.3 10*3/uL (ref 0.0–0.7)
Eosinophils Relative: 4.9 % (ref 0.0–5.0)
HCT: 36.5 % (ref 36.0–46.0)
Hemoglobin: 12 g/dL (ref 12.0–15.0)
Lymphocytes Relative: 52.8 % — ABNORMAL HIGH (ref 12.0–46.0)
Lymphs Abs: 2.8 10*3/uL (ref 0.7–4.0)
MCHC: 33 g/dL (ref 30.0–36.0)
MCV: 89.1 fl (ref 78.0–100.0)
Monocytes Absolute: 0.5 10*3/uL (ref 0.1–1.0)
Monocytes Relative: 8.7 % (ref 3.0–12.0)
Neutro Abs: 1.8 10*3/uL (ref 1.4–7.7)
Neutrophils Relative %: 33 % — ABNORMAL LOW (ref 43.0–77.0)
Platelets: 254 10*3/uL (ref 150.0–400.0)
RBC: 4.09 Mil/uL (ref 3.87–5.11)
RDW: 14.2 % (ref 11.5–15.5)
WBC: 5.3 10*3/uL (ref 4.0–10.5)

## 2023-04-06 LAB — COMPREHENSIVE METABOLIC PANEL
ALT: 15 U/L (ref 0–35)
AST: 19 U/L (ref 0–37)
Albumin: 4.5 g/dL (ref 3.5–5.2)
Alkaline Phosphatase: 36 U/L — ABNORMAL LOW (ref 39–117)
BUN: 21 mg/dL (ref 6–23)
CO2: 27 mEq/L (ref 19–32)
Calcium: 9.4 mg/dL (ref 8.4–10.5)
Chloride: 104 mEq/L (ref 96–112)
Creatinine, Ser: 1.17 mg/dL (ref 0.40–1.20)
GFR: 46.24 mL/min — ABNORMAL LOW (ref >60.00)
Glucose, Bld: 172 mg/dL — ABNORMAL HIGH (ref 70–99)
Potassium: 4.1 mEq/L (ref 3.5–5.1)
Sodium: 139 mEq/L (ref 135–145)
Total Bilirubin: 0.8 mg/dL (ref 0.2–1.2)
Total Protein: 6.6 g/dL (ref 6.0–8.3)

## 2023-04-06 NOTE — Progress Notes (Signed)
   Acute Office Visit  Subjective:     Patient ID: Destiny Romero, female    DOB: 1951-03-05, 72 y.o.   MRN: [id number]  Chief Complaint  Patient presents with   Diarrhea    Diarrhea X Sunday    HPI Patient is in today with complaints of diarrhea x 3 days.  The diarrhea initially started Sunday morning he is unsure of what the trigger is.  She had 6 episodes of diarrhea at that time.  Today, she has had about 2 episodes.  She has been beginning to feel fatigued.  Has been consuming about 32 ounces of fluid daily to help.  Also has a history of chronic renal insufficiency.  She denies any fever, chills, nausea or vomiting.  No blood in the stools or dark black stools.  No known sick contacts.  Unable to remember what she had for dinner on Saturday night.  Review of Systems  Constitutional:  Negative for chills and fever.  Respiratory: Negative.    Cardiovascular: Negative.   Gastrointestinal:  Positive for diarrhea. Negative for blood in stool, heartburn, nausea and vomiting.  Genitourinary: Negative.   Musculoskeletal: Negative.   Neurological: Negative.   Psychiatric/Behavioral: Negative.    All other systems reviewed and are negative.       Objective:    BP 118/82 (BP Location: Left Arm, Patient Position: Sitting, Cuff Size: Large)   Pulse 80   Temp 97.9 F (36.6 C) (Oral)   Wt 168 lb 12.8 oz (76.6 kg)   LMP 04/07/2001 (Approximate)   SpO2 97%   BMI 29.43 kg/m    Physical Exam Vitals and nursing note reviewed.  Constitutional:      Appearance: Normal appearance.  HENT:     Mouth/Throat:     Mouth: Mucous membranes are moist.  Cardiovascular:     Rate and Rhythm: Normal rate and regular rhythm.  Pulmonary:     Effort: Pulmonary effort is normal.     Breath sounds: Normal breath sounds.  Abdominal:     General: Abdomen is flat. Bowel sounds are normal.     Palpations: Abdomen is soft.     Tenderness: There is no abdominal tenderness. There is no guarding or  rebound.  Musculoskeletal:        General: Normal range of motion.  Skin:    General: Skin is warm and dry.  Neurological:     General: No focal deficit present.     Mental Status: She is alert and oriented to person, place, and time.  Psychiatric:        Mood and Affect: Mood normal.        Behavior: Behavior normal.     No results found for any visits on 04/06/23.      Assessment & Plan:   Problem List Items Addressed This Visit   None Visit Diagnoses     Diarrhea, unspecified type    -  Primary   Relevant Orders   CMP   CBC with Differential/Platelets   Viral gastroenteritis           No orders of the defined types were placed in this encounter.  Call the office if symptoms worsen or persist.  Discussed Imodium A-D over-the-counter 2 capsules to start and then 1 capsule every 6-8 hours as needed until the stools begin to form.  Recheck as scheduled and sooner as needed. No follow-ups on file.  Eulis Foster, FNP

## 2023-04-06 NOTE — Patient Instructions (Signed)
Please obtain over-the-counter Imodium A-D and take 2 capsules to start.  Then take 1 capsule every 6-8 hours as needed until the stools bulk.

## 2023-04-07 ENCOUNTER — Ambulatory Visit: Payer: Medicare HMO | Admitting: Family Medicine

## 2023-04-07 ENCOUNTER — Other Ambulatory Visit: Payer: Self-pay | Admitting: Family Medicine

## 2023-04-07 NOTE — Progress Notes (Signed)
GYNECOLOGY  VISIT   HPI: 72 y.o.   Single  Caucasian  female   G0P0000 with Patient's last menstrual period was 04/07/2001 (approximate).   here for sonohysterogram with endometrial biopsy for postmenopausal bleeding and possible endometrial mass.   [place] 01/21/23: Uterus 4.52 x 3.18 x 1.91 cm.  14 mm intramural fibroid. EMS 2.51 mm.  Avascular.  Partially intracavitary mass? Left ovary absent.  Right ovary 1.46 x 1.25 x 1.04 cm. 3 cysts:  0.92 cm, 0.66 cm, 0.80 cm.  Avascular.  Possible paraovarian cysts.  Right adnexa with fluid filled tubular area, consistent with hydrosalpinx.  No free fluid.   Normal CA125.   No further bleeding.   GYNECOLOGIC HISTORY: Patient's last menstrual period was 04/07/2001 (approximate). Contraception:  PMP Menopausal hormone therapy:  estrace Last mammogram:  08/11/22 Breast Density Cat A, BI-RADS CAT 1 neg  Last pap smear:   01/18/23 -neg: HR HPV neg,  01/01/22 LSIL: HR HPV neg, 07/01/16 neg         OB History     Gravida  0   Para  0   Term  0   Preterm  0   AB  0   Living  0      SAB  0   IAB  0   Ectopic  0   Multiple  0   Live Births  0              Patient Active Problem List   Diagnosis Date Noted   Osteoarthritis of right hip 03/29/2023   Viral URI with cough 02/26/2023   Chronic kidney disease, stage 3a (HCC) 01/27/2023   H/O vitrectomy 07/02/2022   Epiretinal membrane, right eye 06/02/2022   Macular pucker, right eye 05/28/2022   HSV-2 (herpes simplex virus 2) infection 04/09/2022   Chronic rhinitis 04/09/2022   Posttraumatic stress disorder 01/07/2022   Arthritis of hand 11/21/2021   History of premalignant rectal tumor 11/13/2021   Degenerative lumbar spinal stenosis 03/03/2021   Lumbar radiculopathy 01/22/2021   Osteoarthritis of both hands 11/10/2020   Carpal tunnel syndrome of left wrist 04/11/2020   Gastroesophageal reflux disease 11/27/2019   Atypical chest pain 11/07/2018   BMI 29.0-29.9,adult  11/07/2018   Dyspnea 11/07/2018   Dupuytren's contracture of both hands 05/30/2018   Constipation 06/08/2016   Decreased visual acuity 06/08/2016   Type 2 diabetes mellitus with retinopathy (HCC) 08/11/2013   Osteopenia 08/19/2012   Hyperlipidemia 06/27/2008   History of colonic polyps 06/27/2008   Recurrent major depressive disorder (HCC) 01/19/2007    Past Medical History:  Diagnosis Date   Allergy    Anal lesion 2007   Anemia    Anesthesia complication    Per pt/ sensitive to sedation!   Arthritis    fingers,toes   Cancer (HCC)    "PRECANCEROUS LESIONS IN RECTUM"   Cataract    bilateral,removed   Central retinal artery occlusion 06/08/2016   limited vision right eye.   Cervical dysplasia 04/1991   Dr Laurena Bering - CIN I, cone and freeze in 1990s with normal paps since   Constipation 06/08/2016   moves bowels every 3 rd day    Decreased visual acuity 06/08/2016   Retinal bleed right eye 2017   Depression    Diabetes mellitus without complication (HCC)    Fatigue 06/27/2017   H/O measles    H/O mumps    History of chicken pox    Hyperlipidemia    Mitral valve prolapse  NO MR; no SBE prophylaxis needed   Osteoporosis    PONV (postoperative nausea and vomiting)    Pre-diabetes    highest A1c 6.4 %   Preventative health care 12/21/2016   Squamous cell carcinoma in situ of skin    Dr Hortense Ramal   STD (sexually transmitted disease)    HSV   Vision impairment    limited vision in right eye.    Past Surgical History:  Procedure Laterality Date   CARPAL TUNNEL RELEASE Right 05/2020   CATARACT EXTRACTION, BILATERAL Bilateral    cataracts   CERVICAL CONE BIOPSY     COLONOSCOPY  2003   COLONOSCOPY  04/01/2011   [place] - repeat in 5 years   COLONOSCOPY W/ POLYPECTOMY  2007   Dr Kinnie Scales   EXCISION MORTON'S NEUROMA Right    EYE SURGERY     07/02/22, 05/31/22   LAMINECTOMY     1982   LUMBAR LAMINECTOMY Left 11/2022   L4-L5   OOPHORECTOMY Left 12/2011   Dr Nicholas Lose ;  ovarian cyst   POLYPECTOMY     RECTAL SURGERY  05/28/2006   pre-canerous lesion removed in 2007; Dr Byrd Hesselbach, [facility] [place]   TUBAL LIGATION      Current Outpatient Medications  Medication Sig Dispense Refill   azelastine (ASTELIN) 0.1 % nasal spray Place 1 spray into both nostrils 2 (two) times daily. Use in each nostril as directed 30 mL 12   Calcium 200 MG TABS Take by mouth. Tale [phone] mg daily     cetirizine (ZYRTEC) 10 MG tablet Take by mouth.     Cholecalciferol (VITAMIN D) 2000 units CAPS daily.     estradiol (ESTRACE) 0.1 MG/GM vaginal cream Place 1 g vaginally 2 (two) times a week.     famotidine (PEPCID) 40 MG tablet TAKE 1 TABLET BY MOUTH EVERYDAY AT BEDTIME 90 tablet 3   glimepiride (AMARYL) 1 MG tablet Take 1 tablet (1 mg total) by mouth in the morning and at bedtime. 180 tablet 3   Glucosamine-Chondroitin 1500-1200 MG/30ML LIQD daily.     lamoTRIgine (LAMICTAL) 200 MG tablet Take 200 mg by mouth daily.     levocetirizine (XYZAL ALLERGY 24HR) 5 MG tablet Take 5 mg by mouth every evening.     lidocaine (XYLOCAINE) 5 % ointment Apply 1 application. topically as needed. 30 g 0   Multiple Vitamin (MULTI VITAMIN) TABS daily.     omega-3 acid ethyl esters (LOVAZA) 1 g capsule Take by mouth daily.     OneTouch Delica Lancets 33G MISC Use to test blood sugar 3X daily.  Dx Code: E11.65 300 each 1   ONETOUCH VERIO test strip USE AS INSTRUCTED 300 strip 12   pioglitazone (ACTOS) 30 MG tablet Take 1 tablet (30 mg total) by mouth daily. 90 tablet 2   rosuvastatin (CRESTOR) 5 MG tablet Take 1 tablet (5 mg total) by mouth daily. 90 tablet 3   TURMERIC PO Take by mouth.     No current facility-administered medications for this visit.     ALLERGIES: Cortisone, Short ragweed pollen ext, Grass pollen([id number]-o-r-t-swt vern), Other, Pseudoephedrine, Atorvastatin, Codeine, Fluoxetine hcl, Metformin, Metformin and related, Pramipexole, and Shellfish allergy  Family History  Problem Relation Age of Onset    Diabetes Mother    Dementia Mother    Prostate cancer Father    COPD Father    Heart disease Father        CAD - used nitroglycerin tablets   Diabetes Sister  obese   Thyroid cancer Sister    Cancer Brother        non Hodgkin's Lymphoma, leukemia   Diabetes Brother        not obese   Kidney disease Brother        Kidney failure   Diabetes Brother    Kidney failure Brother    Diabetes Maternal Aunt        X5 ; all IDDM   Heart disease Maternal Aunt        "enlarged heart"   Heart disease Maternal Uncle        "enlarged heart"   Diabetes Maternal Grandmother        IDDM   Diabetes Paternal Grandmother        IDDM   Heart disease Paternal Grandmother    Heart disease Paternal Grandfather        CAD - used nitroglycerin tablets   Colon cancer Neg Hx    Esophageal cancer Neg Hx    Stomach cancer Neg Hx    Stroke Neg Hx    Colon polyps Neg Hx    Rectal cancer Neg Hx     Social History   Socioeconomic History   Marital status: Single    Spouse name: Not on file   Number of children: 0   Years of education: Not on file   Highest education level: Bachelor's degree (e.g., BA, AB, BS)  Occupational History   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  Tobacco Use   Smoking status: Never   Smokeless tobacco: Never  Vaping Use   Vaping status: Never Used  Substance and Sexual Activity   Alcohol use: Yes    Comment: glass wine once a month   Drug use: No   Sexual activity: Yes    Partners: Male    Comment: 1st intercourse 72 yo-More than 5 partners  Other Topics Concern   Not on file  Social History Narrative   Lives alone   No dietary restrictions   Social Determinants of Health   Financial Resource Strain: Low Risk  (02/25/2023)   Overall Financial Resource Strain (CARDIA)    Difficulty of Paying Living Expenses: Not hard at all  Food Insecurity: No Food Insecurity (02/25/2023)   Hunger Vital Sign    Worried About Running Out of Food in the Last Year:  Never true    Ran Out of Food in the Last Year: Never true  Transportation Needs: No Transportation Needs (02/25/2023)   PRAPARE - Administrator, Civil Service (Medical): No    Lack of Transportation (Non-Medical): No  Physical Activity: Insufficiently Active (02/25/2023)   Exercise Vital Sign    Days of Exercise per Week: 4 days    Minutes of Exercise per Session: 30 min  Stress: No Stress Concern Present (02/25/2023)   [employer] of Occupational Health - Occupational Stress Questionnaire    Feeling of Stress : Only a little  Recent Concern: Stress - Stress Concern Present (12/21/2022)   [employer] of Occupational Health - Occupational Stress Questionnaire    Feeling of Stress : To some extent  Social Connections: Moderately Integrated (02/25/2023)   Social Connection and Isolation Panel [NHANES]    Frequency of Communication with Friends and Family: Once a week    Frequency of Social Gatherings with Friends and Family: Twice a week    Attends Religious Services: More than 4 times per year    Active Member of [employer] or Organizations: Yes  Attends Banker Meetings: More than 4 times per year    Marital Status: Divorced  Intimate Partner Violence: Not At Risk (02/26/2023)   Humiliation, Afraid, Rape, and Kick questionnaire    Fear of Current or Ex-Partner: No    Emotionally Abused: No    Physically Abused: No    Sexually Abused: No    Review of Systems  PHYSICAL EXAMINATION:    LMP 04/07/2001 (Approximate)     General appearance: alert, cooperative and appears stated age  Pelvic US Uterus 5.00 x 3.58 x 2.13 cm.  Single fundal fibroid, no change.  EMS 1.8 mm. Left ovary absent.  Right ovary 2.31 x 0.93 x 1.20 cm.  Right hydrosalpinx noted and 14 x 8 mm avascular solid structure noted.  No free fluid.   Sonohysterogram Consent done.  Sterile prep with Hibiclens. Canula passed and sterile saline fluid injected into uterine cavity.  No  filling defects of uterine cavity or cervix noted.   EMB Consent done.  Sterile prep with Hibiclens.  Tenaculum to anterior cervical lip. Pipelle passed to 6 cm x 2.  Minimal tissue obtained.  Sent to pathology.  No complications.  Minimal EBL.  Chaperone was present for exam:  Rosette Reveal, CMA  ASSESSMENT  Postmenopausal bleeding.  Right hydrosalpinx. Small solid avascular right adnexal mass.  Normal CA125.   PLAN  Results of sonohysterogram discussed with patient.  Fu EMB.  Call for future bleeding.  FU prn.   10  total time was spent for this patient encounter, including preparation, face-to-face counseling with the patient, coordination of care, and documentation of the encounter in addition to doing the sonohysterogram and endometrial biopsy.

## 2023-04-08 ENCOUNTER — Other Ambulatory Visit (HOSPITAL_COMMUNITY)
Admission: RE | Admit: 2023-04-08 | Payer: Medicare HMO | Source: Ambulatory Visit | Admitting: Obstetrics and Gynecology

## 2023-04-08 ENCOUNTER — Other Ambulatory Visit: Payer: Self-pay | Admitting: Obstetrics and Gynecology

## 2023-04-08 ENCOUNTER — Ambulatory Visit (INDEPENDENT_AMBULATORY_CARE_PROVIDER_SITE_OTHER): Payer: Medicare HMO

## 2023-04-08 ENCOUNTER — Ambulatory Visit: Payer: Medicare HMO | Admitting: Obstetrics and Gynecology

## 2023-04-08 DIAGNOSIS — N950 Postmenopausal bleeding: Secondary | ICD-10-CM | POA: Insufficient documentation

## 2023-04-08 NOTE — Patient Instructions (Signed)
Endometrial Biopsy  An endometrial biopsy is a procedure where a tissue sample is removed from the lining of the uterus. This lining is called the endometrium. The tissue sample is then sent to a lab for testing. You may have this type of biopsy to check for: Cancer. Infection. Growths called polyps. Uterine bleeding that can't be explained. Tell a health care provider about: Any allergies you have. All medicines you're taking including vitamins, herbs, eye drops, creams, and over-the-counter medicines. Any problems you or family members have had with anesthesia. Any bleeding problems you have. Any surgeries you have had. Any medical problems you have. Whether you're pregnant or may be pregnant. What are the risks? Your health care provider will talk with you about risks. These may include: Bleeding. Infection. Allergic reactions to medicines. Damage to the wall of the uterus. This is rare. What happens before the procedure? Keep track of your period. You may need to have this biopsy when you're not having your period. Ask your provider about: Changing or stopping your regular medicines. These include any diabetes medicines or blood thinners you take. Taking medicines such as aspirin and ibuprofen. These medicines can thin your blood. Do not take them unless your provider tells you to. Taking over-the-counter medicines, vitamins, herbs, and supplements. Bring a pad with you. You may need to wear one after the biopsy. Plan to have a responsible adult take you home from the hospital or clinic. You won't be allowed to drive. What happens during the procedure? A tool will be put into your vagina to hold it open. This helps your provider see the cervix. The cervix is the lowest part of the uterus. Your cervix will be cleaned with a solution that kills germs. You will be given anesthesia. This keeps you from feeling pain. It will numb your cervix. A tool called forceps will be used to  hold your cervix steady. A thin tool called a uterine sound will be put through your cervix. It will be used to: Find the length of your uterus. Find where to take the sample from. A soft tube called a catheter will be put into your uterus. The catheter will remove a tissue sample. The tube and tools will be removed. The sample will be sent to a lab for testing. The procedure may vary among providers and hospitals. What happens after the procedure? Your blood pressure, heart rate, breathing rate, and blood oxygen level will be monitored until you leave the hospital or clinic. It's up to you to get the results of your procedure. Ask your provider, or the department that is doing the procedure, when your results will be ready. This information is not intended to replace advice given to you by your health care provider. Make sure you discuss any questions you have with your health care provider. Document Revised: 11/03/2022 Document Reviewed: 11/03/2022 Elsevier Patient Education  2024 [employer].

## 2023-04-09 ENCOUNTER — Encounter: Payer: Self-pay | Admitting: Obstetrics and Gynecology

## 2023-04-14 ENCOUNTER — Encounter: Payer: Self-pay | Admitting: Obstetrics and Gynecology

## 2023-04-15 ENCOUNTER — Encounter: Payer: Self-pay | Admitting: Physical Therapy

## 2023-04-15 ENCOUNTER — Ambulatory Visit: Payer: Medicare HMO | Attending: Family Medicine | Admitting: Physical Therapy

## 2023-04-15 DIAGNOSIS — G8929 Other chronic pain: Secondary | ICD-10-CM | POA: Insufficient documentation

## 2023-04-15 DIAGNOSIS — M6281 Muscle weakness (generalized): Secondary | ICD-10-CM | POA: Diagnosis not present

## 2023-04-15 DIAGNOSIS — M25551 Pain in right hip: Secondary | ICD-10-CM | POA: Diagnosis not present

## 2023-04-15 DIAGNOSIS — R262 Difficulty in walking, not elsewhere classified: Secondary | ICD-10-CM | POA: Insufficient documentation

## 2023-04-15 DIAGNOSIS — M545 Low back pain, unspecified: Secondary | ICD-10-CM | POA: Insufficient documentation

## 2023-04-15 DIAGNOSIS — M62830 Muscle spasm of back: Secondary | ICD-10-CM | POA: Diagnosis not present

## 2023-04-15 DIAGNOSIS — M25651 Stiffness of right hip, not elsewhere classified: Secondary | ICD-10-CM | POA: Diagnosis not present

## 2023-04-15 NOTE — Telephone Encounter (Signed)
A msg has been sent to appt desk.

## 2023-04-15 NOTE — Therapy (Signed)
OUTPATIENT PHYSICAL THERAPY THORACOLUMBAR EVALUATION   Patient Name: RIKIA TRINKLE MRN: [id number] DOB:03/24/1951, 72 y.o., female Today's Date: 04/15/2023  END OF SESSION:  PT End of Session - 04/15/23 0851     Visit Number 7    Date for PT Re-Evaluation 05/19/23    PT Start Time 0851    PT Stop Time 0930    PT Time Calculation (min) 39 min    Activity Tolerance Patient tolerated treatment well    Behavior During Therapy [facility] for tasks assessed/performed              Past Medical History:  Diagnosis Date   Allergy    Anal lesion 2007   Anemia    Anesthesia complication    Per pt/ sensitive to sedation!   Arthritis    fingers,toes   Cancer (HCC)    "PRECANCEROUS LESIONS IN RECTUM"   Cataract    bilateral,removed   Central retinal artery occlusion 06/08/2016   limited vision right eye.   Cervical dysplasia 04/1991   Dr Laurena Bering - CIN I, cone and freeze in 1990s with normal paps since   Constipation 06/08/2016   moves bowels every 3 rd day    Decreased visual acuity 06/08/2016   Retinal bleed right eye 2017   Depression    Diabetes mellitus without complication (HCC)    Fatigue 06/27/2017   H/O measles    H/O mumps    History of chicken pox    Hyperlipidemia    Mitral valve prolapse    NO MR; no SBE prophylaxis needed   Osteoporosis    PONV (postoperative nausea and vomiting)    Pre-diabetes    highest A1c 6.4 %   Preventative health care 12/21/2016   Squamous cell carcinoma in situ of skin    Dr Hortense Ramal   STD (sexually transmitted disease)    HSV   Vision impairment    limited vision in right eye.   Past Surgical History:  Procedure Laterality Date   CARPAL TUNNEL RELEASE Right 05/2020   CATARACT EXTRACTION, BILATERAL Bilateral    cataracts   CERVICAL CONE BIOPSY     COLONOSCOPY  2003   COLONOSCOPY  04/01/2011   [place] - repeat in 5 years   COLONOSCOPY W/ POLYPECTOMY  2007   Dr Kinnie Scales   EXCISION MORTON'S NEUROMA Right    EYE SURGERY      07/02/22, 05/31/22   LAMINECTOMY     1982   LUMBAR LAMINECTOMY Left 11/2022   L4-L5   OOPHORECTOMY Left 12/2011   Dr Nicholas Lose ; ovarian cyst   POLYPECTOMY     RECTAL SURGERY  05/28/2006   pre-canerous lesion removed in 2007; Dr Byrd Hesselbach, [place]   TUBAL LIGATION     Patient Active Problem List   Diagnosis Date Noted   Osteoarthritis of right hip 03/29/2023   Viral URI with cough 02/26/2023   Chronic kidney disease, stage 3a (HCC) 01/27/2023   H/O vitrectomy 07/02/2022   Epiretinal membrane, right eye 06/02/2022   Macular pucker, right eye 05/28/2022   HSV-2 (herpes simplex virus 2) infection 04/09/2022   Chronic rhinitis 04/09/2022   Posttraumatic stress disorder 01/07/2022   Arthritis of hand 11/21/2021   History of premalignant rectal tumor 11/13/2021   Degenerative lumbar spinal stenosis 03/03/2021   Lumbar radiculopathy 01/22/2021   Osteoarthritis of both hands 11/10/2020   Carpal tunnel syndrome of left wrist 04/11/2020   Gastroesophageal reflux disease 11/27/2019   Atypical chest pain 11/07/2018   BMI  29.0-29.9,adult 11/07/2018   Dyspnea 11/07/2018   Dupuytren's contracture of both hands 05/30/2018   Constipation 06/08/2016   Decreased visual acuity 06/08/2016   Type 2 diabetes mellitus with retinopathy (HCC) 08/11/2013   Osteopenia 08/19/2012   Hyperlipidemia 06/27/2008   History of colonic polyps 06/27/2008   Recurrent major depressive disorder (HCC) 01/19/2007    PCP: Loyola Mast, MD  REFERRING PROVIDER: Arman Bogus, MD  REFERRING DIAG: M54.16 (ICD-10-CM) - Radiculopathy, lumbar region   Rationale for Evaluation and Treatment: Rehabilitation  THERAPY DIAG:  Muscle weakness (generalized)  Chronic low back pain, unspecified back pain laterality, unspecified whether sciatica present  Pain in right hip  Difficulty in walking, not elsewhere classified  ONSET DATE: 03/03/23  SUBJECTIVE:                                                                                                                                                                                            SUBJECTIVE STATEMENT: Doing good the catch is still there if she has been sitting for a while.  PERTINENT HISTORY:  Per referring Physician note in April:  doing really well from spine standpoint, no real back pain, no leg pain or NTW, still has R groin pain with wt bearing, was told at [facility] she has &#34;arthritis&#34; there. had xrays in Feb which show mild degen changes   PAIN:  Are you having pain? Yes: NPRS scale: 0/10 Pain location: R posterior hip, piriformis area. Pain description: aching Aggravating factors: Standing after prolong sitting Relieving factors: A little bit of motion helps  PRECAUTIONS: Back  WEIGHT BEARING RESTRICTIONS: No  FALLS:  Has patient fallen in last 6 months? 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  PLOF: Independent  PATIENT GOALS: Wants to get by to walking and hiking, get into the gym  NEXT MD VISIT: No follow up scheduled  OBJECTIVE:   DIAGNOSTIC FINDINGS:  N/A  PATIENT SURVEYS:  FOTO 52  SCREENING FOR RED FLAGS: Bowel or bladder incontinence: No Spinal tumors: No Cauda equina syndrome: No Compression fracture: No Abdominal aneurysm: No  COGNITION: Overall cognitive status: Within functional limits for tasks assessed     SENSATION: WFL  MUSCLE LENGTH: Hamstrings: Right 70 deg; Left 75 deg Thomas test: Tight B hip flexors  POSTURE: No Significant postural limitations  PALPATION: TTP R QL, gluts, piriformis, ITB  LUMBAR ROM: Reports tightness at end range with all motion, but no real pain.  AROM eval  Flexion To  toes  Extension 100%  Right lateral flexion To knee  Left lateral flexion To knee  Right rotation 80%  Left  rotation 80%   (Blank rows = not tested)  LOWER EXTREMITY ROM:   Generally WNL, some pain at end of range with R hip motion.   LOWER EXTREMITY MMT:  LLE 5/5, R limited by pain  MMT Right eval Left eval  Hip flexion 3+   Hip extension 3   Hip abduction 4-   Hip adduction    Hip internal rotation    Hip external rotation 3+   Knee flexion 4   Knee extension 4   Ankle dorsiflexion 4   Ankle plantarflexion    Ankle inversion    Ankle eversion       LUMBAR SPECIAL TESTS:  Straight leg raise test: Positive, Slump test: Positive, and Single leg stance test: Positive  FUNCTIONAL TESTS:  5 times sit to stand: 15.58  GAIT: Distance walked: In clinic distances Assistive device utilized: None Level of assistance: Complete Independence Comments: Mildly antalgic gait pattern with decrease WB through R hip.  TODAY'S TREATMENT:                                                                                                                              DATE:  04/15/23 NuStep L 5 x 6 min CHECKED Goals  Forward and lateral step ups 4in box on airex x 10 each  30lb resisted side step x5 each HS curls 25lb 2x15 Leg Ext 10lb 2x15  04/05/23 Bike L 2 x 5 min Resisted gait 30lb 4 way x 3 each Forward and lateral 6in step ups x 10 each  HS curls 35lb 2x10 Leg Ext 10lb 2x10 Seated hip abd blue 2x15 Seated HS stretch  03/26/23 NuStep L5 x 6 minutes STM to R ITB and piriformis Supine hip stretch, figure 4, ITB, gluts, 3 way HS stretch with strap Hip stabilization exercises in supine, ball squeeze, clamshell, bridge with hip abd against G tband, and bridge with ball squeeze, 10 each  03/23/23 NuStep L5 x 6 minutes R hip PROM with end range holds  Supine bridges Hooklying march w/ ab set LE on Pball bridges, K2C, Oblq Supine hip add x10 Ball squeeze bridge x10 stopped after 6 reps due to pain ITB stretch  HS curls 20lb 2x10 Leg Ext 5ln 2x10   03/19/23 NuStep L4 x 6  minutes Stretch to hip ext and ER Supine bridge x 10 Supine bridge over ball with B hip IR x 10. Bridge with ball roll R/L x 5. Alternately lower one leg to the mat and lift back while stabilizing ball with opposite leg, 10 reps each side. Seated mini squats with G tband at knees B side to side step with G tband at knees. Seated figure 4, HS, and glut stretch B  03/16/23 Bike L2 x6 min  S2S 2x10  Hamstring curls 25lb 2x10 Leg Ext 5lb 2x10  Rows green 2x10 Shoulder Ext green 2x10  Passive stretching to Hamstrings, Single K2C, piriformis, lower trunk rotations.  03/10/23   PATIENT EDUCATION:  Education details: POC Person educated: Patient Education method: Explanation Education comprehension: verbalized understanding  HOME EXERCISE PROGRAM: [id number]  ASSESSMENT:  CLINICAL IMPRESSION: Again treatment focused on hip  strengthening utilizing mostly functional interventions. Pt did have a few occurrence of her hip catching with movement..  Cue to increase step length with resisted side steps. She has progressed and met some LTG's.   OBJECTIVE IMPAIRMENTS: Abnormal gait, decreased activity tolerance, decreased balance, decreased coordination, difficulty walking, decreased ROM, decreased strength, impaired flexibility, postural dysfunction, and pain.   ACTIVITY LIMITATIONS: carrying, lifting, bending, standing, squatting, stairs, and locomotion level  PARTICIPATION LIMITATIONS: meal prep, cleaning, laundry, and community activity  PERSONAL FACTORS: Past/current experiences are also affecting patient's functional outcome.   REHAB POTENTIAL: Good  CLINICAL DECISION MAKING: Stable/uncomplicated  EVALUATION COMPLEXITY: Low   GOALS: Goals reviewed with patient? Yes  SHORT TERM GOALS: Target date: 03/24/23  I with intiial HEP Baseline: Goal status: 03/26/23-met  LONG TERM GOALS: Target date: 9\11\24  I with final HEP Baseline:  Goal status: INITIAL  2.  Increase FOTO score  to at least 58 Baseline: 52 Goal status: INITIAL  3.  Decrease 5x STS to < 12 sec Baseline: 15.4 Goal status: Met 9.29 sec 04/15/23  4.  Patient will walk on unlevel surfaces x at least 1000' with pain < 3/10 Baseline: Limited walking of any kind due to pain. Goal status: progressing 04/05/23  5.  Patient will report pain of < 3/10 with any of her normal daily activities, not having to stop and sit due to pain. Baseline: Limited activity, has to sit down after about 15 min of standing or walking. Goal status: Progressing 04/15/23  6.  Patient will be able to get down to the floor and back up MI, with confidence. Baseline: Fearful to get down to the floor due to difficulty rising. Goal status: Met, has been getting on the floor at home to do bridges 04/15/23  PLAN:  PT FREQUENCY: 1-2x/week  PT DURATION: 10 weeks  PLANNED INTERVENTIONS: Therapeutic exercises, Therapeutic activity, Neuromuscular re-education, Balance training, Gait training, Patient/Family education, Self Care, Joint mobilization, Stair training, Dry Needling, Electrical stimulation, Spinal mobilization, Cryotherapy, Moist heat, Ionotophoresis 4mg /ml Dexamethasone, and Manual therapy.  PLAN FOR NEXT SESSION: 03/26/23- SI assessment?  Debroah Baller, PTA 04/15/2023, 8:51 AM

## 2023-04-15 NOTE — Telephone Encounter (Signed)
Pt scheduled on 05/05/2023. Routing to provider for final review and closing encounter.

## 2023-04-15 NOTE — Telephone Encounter (Signed)
Pt had additional inquiries/comments for you to review/respond if desired.

## 2023-04-21 NOTE — Progress Notes (Signed)
GYNECOLOGY  VISIT   HPI: 72 y.o.   Single  Caucasian  female   [id number] with Patient's last menstrual period was 04/07/2001 (approximate).   here for discuss test results.  She states she bled for 1 week after her sonohysterogram/endometrial biopsy and is not having further bleeding.   Seen for sonohysterogram and EMB for postmenopausal bleeding and endometrial mass on 04/08/23.  Pelvic US Uterus 5.00 x 3.58 x 2.13 cm.  Single fundal fibroid, no change.  EMS 1.8 mm. Left ovary absent.  Right ovary 2.31 x 0.93 x 1.20 cm.  Right hydrosalpinx noted and 14 x 8 mm avascular solid structure noted.  No free fluid.    Sonohysterogram Consent done.  Sterile prep with Hibiclens. Canula passed and sterile saline fluid injected into uterine cavity.  No filling defects of uterine cavity or cervix noted.    EMB done 04/08/23 showed focal cytologic atypia of undetermined significance.  The specimen had superficial strips of endometrium without significant stromal component. No definitive hyperplasia were noted.   Her prior pelvic US 01/21/23 that lead to the sonohysterogram/endometrial biopsy appointment: Uterus 4.52 x 3.18 x 1.91 cm.  14 mm intramural fibroid. EMS 2.51 mm.  Avascular.  Partially intracavitary mass? Left ovary absent.  Right ovary 1.46 x 1.25 x 1.04 cm. 3 cysts:  0.92 cm, 0.66 cm, 0.80 cm.  Avascular.  Possible paraovarian cysts.  Right adnexa with fluid filled tubular area, consistent with hydrosalpinx.  No free fluid.   CA125 on 01/21/23:  3.  May be moving to [place] to be closer to family.   GYNECOLOGIC HISTORY: Patient's last menstrual period was 04/07/2001 (approximate). Contraception:  PMP Menopausal hormone therapy:  estrace Last mammogram:  08/11/22 Breast Density Cat A, BI-RADS CAT 1 neg  Last pap smear:   01/18/23 -neg: HR HPV neg,  01/01/22 LSIL: HR HPV neg, 07/01/16 neg         OB History     Gravida  0   Para  0   Term  0   Preterm  0   AB  0    Living  0      SAB  0   IAB  0   Ectopic  0   Multiple  0   Live Births  0              Patient Active Problem List   Diagnosis Date Noted   Osteoarthritis of right hip 03/29/2023   Viral URI with cough 02/26/2023   Chronic kidney disease, stage 3a (HCC) 01/27/2023   H/O vitrectomy 07/02/2022   Epiretinal membrane, right eye 06/02/2022   Macular pucker, right eye 05/28/2022   HSV-2 (herpes simplex virus 2) infection 04/09/2022   Chronic rhinitis 04/09/2022   Posttraumatic stress disorder 01/07/2022   Arthritis of hand 11/21/2021   History of premalignant rectal tumor 11/13/2021   Degenerative lumbar spinal stenosis 03/03/2021   Lumbar radiculopathy 01/22/2021   Osteoarthritis of both hands 11/10/2020   Carpal tunnel syndrome of left wrist 04/11/2020   Gastroesophageal reflux disease 11/27/2019   Atypical chest pain 11/07/2018   BMI 29.0-29.9,adult 11/07/2018   Dyspnea 11/07/2018   Dupuytren's contracture of both hands 05/30/2018   Constipation 06/08/2016   Decreased visual acuity 06/08/2016   Type 2 diabetes mellitus with retinopathy (HCC) 08/11/2013   Osteopenia 08/19/2012   Hyperlipidemia 06/27/2008   History of colonic polyps 06/27/2008   Recurrent major depressive disorder (HCC) 01/19/2007    Past Medical History:  Diagnosis Date  Allergy    Anal lesion 2007   Anemia    Anesthesia complication    Per pt/ sensitive to sedation!   Arthritis    fingers,toes   Cancer (HCC)    "PRECANCEROUS LESIONS IN RECTUM"   Cataract    bilateral,removed   Central retinal artery occlusion 06/08/2016   limited vision right eye.   Cervical dysplasia 04/1991   Dr Laurena Bering - CIN I, cone and freeze in 1990s with normal paps since   Constipation 06/08/2016   moves bowels every 3 rd day    Decreased visual acuity 06/08/2016   Retinal bleed right eye 2017   Depression    Diabetes mellitus without complication (HCC)    Fatigue 06/27/2017   H/O measles    H/O mumps     History of chicken pox    Hyperlipidemia    Mitral valve prolapse    NO MR; no SBE prophylaxis needed   Osteoporosis    PONV (postoperative nausea and vomiting)    Pre-diabetes    highest A1c 6.4 %   Preventative health care 12/21/2016   Squamous cell carcinoma in situ of skin    Dr Hortense Ramal   STD (sexually transmitted disease)    HSV   Vision impairment    limited vision in right eye.    Past Surgical History:  Procedure Laterality Date   CARPAL TUNNEL RELEASE Right 05/2020   CATARACT EXTRACTION, BILATERAL Bilateral    cataracts   CERVICAL CONE BIOPSY     COLONOSCOPY  2003   COLONOSCOPY  04/01/2011   [place] - repeat in 5 years   COLONOSCOPY W/ POLYPECTOMY  2007   Dr Kinnie Scales   EXCISION MORTON'S NEUROMA Right    EYE SURGERY     07/02/22, 05/31/22   LAMINECTOMY     1982   LUMBAR LAMINECTOMY Left 11/2022   L4-L5   OOPHORECTOMY Left 12/2011   Dr Nicholas Lose ; ovarian cyst   POLYPECTOMY     RECTAL SURGERY  05/28/2006   pre-canerous lesion removed in 2007; Dr Byrd Hesselbach, [place]   TUBAL LIGATION      Current Outpatient Medications  Medication Sig Dispense Refill   azelastine (ASTELIN) 0.1 % nasal spray Place 1 spray into both nostrils 2 (two) times daily. Use in each nostril as directed 30 mL 12   Calcium 200 MG TABS Take by mouth. Tale [phone] mg daily     cetirizine (ZYRTEC) 10 MG tablet Take by mouth.     Cholecalciferol (VITAMIN D) 2000 units CAPS daily.     estradiol (ESTRACE) 0.1 MG/GM vaginal cream Place 1 g vaginally 2 (two) times a week.     famotidine (PEPCID) 40 MG tablet TAKE 1 TABLET BY MOUTH EVERYDAY AT BEDTIME 90 tablet 3   glimepiride (AMARYL) 1 MG tablet Take 1 tablet (1 mg total) by mouth in the morning and at bedtime. 180 tablet 3   Glucosamine-Chondroitin 1500-1200 MG/30ML LIQD daily.     lamoTRIgine (LAMICTAL) 200 MG tablet Take 200 mg by mouth daily.     levocetirizine (XYZAL ALLERGY 24HR) 5 MG tablet Take 5 mg by mouth every evening.     lidocaine  (XYLOCAINE) 5 % ointment Apply 1 application. topically as needed. 30 g 0   Multiple Vitamin (MULTI VITAMIN) TABS daily.     omega-3 acid ethyl esters (LOVAZA) 1 g capsule Take by mouth daily.     OneTouch Delica Lancets 33G MISC Use to test blood sugar 3X daily.  Dx Code: E11.65  300 each 1   ONETOUCH VERIO test strip USE AS INSTRUCTED 300 strip 12   pioglitazone (ACTOS) 30 MG tablet Take 1 tablet (30 mg total) by mouth daily. 90 tablet 2   rosuvastatin (CRESTOR) 5 MG tablet Take 1 tablet (5 mg total) by mouth daily. 90 tablet 3   TURMERIC PO Take by mouth.     No current facility-administered medications for this visit.     ALLERGIES: Cortisone, Short ragweed pollen ext, Grass pollen(k-o-r-t-swt vern), Other, Pseudoephedrine, Atorvastatin, Codeine, Fluoxetine hcl, Metformin, Metformin and related, Pramipexole, and Shellfish allergy  Family History  Problem Relation Age of Onset   Diabetes Mother    Dementia Mother    Prostate cancer Father    COPD Father    Heart disease Father        CAD - used nitroglycerin tablets   Diabetes Sister        obese   Thyroid cancer Sister    Cancer Brother        non Hodgkin's Lymphoma, leukemia   Diabetes Brother        not obese   Kidney disease Brother        Kidney failure   Diabetes Brother    Kidney failure Brother    Diabetes Maternal Aunt        X5 ; all IDDM   Heart disease Maternal Aunt        "enlarged heart"   Heart disease Maternal Uncle        "enlarged heart"   Diabetes Maternal Grandmother        IDDM   Diabetes Paternal Grandmother        IDDM   Heart disease Paternal Grandmother    Heart disease Paternal Grandfather        CAD - used nitroglycerin tablets   Colon cancer Neg Hx    Esophageal cancer Neg Hx    Stomach cancer Neg Hx    Stroke Neg Hx    Colon polyps Neg Hx    Rectal cancer Neg Hx     Social History   Socioeconomic History   Marital status: Single    Spouse name: Not on file   Number of children:  0   Years of education: Not on file   Highest education level: Bachelor's degree (e.g., BA, AB, BS)  Occupational History   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  Tobacco Use   Smoking status: Never   Smokeless tobacco: Never  Vaping Use   Vaping status: Never Used  Substance and Sexual Activity   Alcohol use: Yes    Comment: glass wine once a month   Drug use: No   Sexual activity: Yes    Partners: Male    Comment: 1st intercourse 72 yo-More than 5 partners  Other Topics Concern   Not on file  Social History Narrative   Lives alone   No dietary restrictions   Social Determinants of Health   Financial Resource Strain: Low Risk  (02/25/2023)   Overall Financial Resource Strain (CARDIA)    Difficulty of Paying Living Expenses: Not hard at all  Food Insecurity: No Food Insecurity (02/25/2023)   Hunger Vital Sign    Worried About Running Out of Food in the Last Year: Never true    Ran Out of Food in the Last Year: Never true  Transportation Needs: No Transportation Needs (02/25/2023)   PRAPARE - Administrator, Civil Service (Medical): No    Lack of Transportation (  Non-Medical): No  Physical Activity: Insufficiently Active (02/25/2023)   Exercise Vital Sign    Days of Exercise per Week: 4 days    Minutes of Exercise per Session: 30 min  Stress: No Stress Concern Present (02/25/2023)   [employer] of Occupational Health - Occupational Stress Questionnaire    Feeling of Stress : Only a little  Recent Concern: Stress - Stress Concern Present (12/21/2022)   [employer] of Occupational Health - Occupational Stress Questionnaire    Feeling of Stress : To some extent  Social Connections: Moderately Integrated (02/25/2023)   Social Connection and Isolation Panel [NHANES]    Frequency of Communication with Friends and Family: Once a week    Frequency of Social Gatherings with Friends and Family: Twice a week    Attends Religious Services: More than 4 times per  year    Active Member of [employer] or Organizations: Yes    Attends Banker Meetings: More than 4 times per year    Marital Status: Divorced  Intimate Partner Violence: Not At Risk (02/26/2023)   Humiliation, Afraid, Rape, and Kick questionnaire    Fear of Current or Ex-Partner: No    Emotionally Abused: No    Physically Abused: No    Sexually Abused: No    Review of Systems  All other systems reviewed and are negative.   PHYSICAL EXAMINATION:    BP 130/86 (BP Location: Left Arm, Patient Position: Sitting, Cuff Size: Normal)   Pulse 72   Ht 5' 3.5" (1.613 m)   Wt 168 lb (76.2 kg)   LMP 04/07/2001 (Approximate)   SpO2 97%   BMI 29.29 kg/m     General appearance: alert, cooperative and appears stated age  ASSESSMENT  Postmenopausal bleeding.  Endometrial biopsy showing atypia with specimen limited for evaluation.  Inconclusive for endometrial hyperplasia.  Right hydrosalpinx with small solid avascular right adnexal mass.  Normal CA125.   Hx of left oophorectomy.   PLAN  We discussed options for care:  repeat endometrial biopsy in the office versus hysteroscopy with dilation and curettage  +/- laparoscopic right salpingo-oophorectomy.  We focused more on the hysteroscopic portion of care today, but the laparoscopy could also be considered.  Patient prefers a surgical approach versus repeat office endometrial sampling.  Will place referral to Dr. Karma Greaser.   27 min  total time was spent for this patient encounter, including preparation, face-to-face counseling with the patient, coordination of care, and documentation of the encounter.

## 2023-04-23 ENCOUNTER — Ambulatory Visit: Payer: Medicare HMO | Admitting: Dietician

## 2023-04-29 ENCOUNTER — Ambulatory Visit: Payer: Medicare HMO | Admitting: Physical Therapy

## 2023-04-30 ENCOUNTER — Ambulatory Visit: Payer: Medicare HMO | Admitting: Physical Therapy

## 2023-04-30 ENCOUNTER — Encounter: Payer: Self-pay | Admitting: Physical Therapy

## 2023-04-30 DIAGNOSIS — M62830 Muscle spasm of back: Secondary | ICD-10-CM | POA: Diagnosis not present

## 2023-04-30 DIAGNOSIS — M545 Low back pain, unspecified: Secondary | ICD-10-CM

## 2023-04-30 DIAGNOSIS — M25651 Stiffness of right hip, not elsewhere classified: Secondary | ICD-10-CM | POA: Diagnosis not present

## 2023-04-30 DIAGNOSIS — M6281 Muscle weakness (generalized): Secondary | ICD-10-CM

## 2023-04-30 DIAGNOSIS — G8929 Other chronic pain: Secondary | ICD-10-CM | POA: Diagnosis not present

## 2023-04-30 DIAGNOSIS — R262 Difficulty in walking, not elsewhere classified: Secondary | ICD-10-CM

## 2023-04-30 DIAGNOSIS — M25551 Pain in right hip: Secondary | ICD-10-CM | POA: Diagnosis not present

## 2023-04-30 NOTE — Therapy (Signed)
OUTPATIENT PHYSICAL THERAPY THORACOLUMBAR EVALUATION   Patient Name: Destiny Romero MRN: [id number] DOB:September 06, 1951, 72 y.o., female Today's Date: 04/30/2023  END OF SESSION:  PT End of Session - 04/30/23 0934     Visit Number 8    Date for PT Re-Evaluation 05/19/23    PT Start Time 0930    PT Stop Time 1010    PT Time Calculation (min) 40 min    Activity Tolerance Patient tolerated treatment well    Behavior During Therapy [facility] for tasks assessed/performed               Past Medical History:  Diagnosis Date   Allergy    Anal lesion 2007   Anemia    Anesthesia complication    Per pt/ sensitive to sedation!   Arthritis    fingers,toes   Cancer (HCC)    "PRECANCEROUS LESIONS IN RECTUM"   Cataract    bilateral,removed   Central retinal artery occlusion 06/08/2016   limited vision right eye.   Cervical dysplasia 04/1991   Dr Laurena Bering - CIN I, cone and freeze in 1990s with normal paps since   Constipation 06/08/2016   moves bowels every 3 rd day    Decreased visual acuity 06/08/2016   Retinal bleed right eye 2017   Depression    Diabetes mellitus without complication (HCC)    Fatigue 06/27/2017   H/O measles    H/O mumps    History of chicken pox    Hyperlipidemia    Mitral valve prolapse    NO MR; no SBE prophylaxis needed   Osteoporosis    PONV (postoperative nausea and vomiting)    Pre-diabetes    highest A1c 6.4 %   Preventative health care 12/21/2016   Squamous cell carcinoma in situ of skin    Dr Hortense Ramal   STD (sexually transmitted disease)    HSV   Vision impairment    limited vision in right eye.   Past Surgical History:  Procedure Laterality Date   CARPAL TUNNEL RELEASE Right 05/2020   CATARACT EXTRACTION, BILATERAL Bilateral    cataracts   CERVICAL CONE BIOPSY     COLONOSCOPY  2003   COLONOSCOPY  04/01/2011   [place] - repeat in 5 years   COLONOSCOPY W/ POLYPECTOMY  2007   Dr Kinnie Scales   EXCISION MORTON'S NEUROMA Right    EYE SURGERY      07/02/22, 05/31/22   LAMINECTOMY     1982   LUMBAR LAMINECTOMY Left 11/2022   L4-L5   OOPHORECTOMY Left 12/2011   Dr Nicholas Lose ; ovarian cyst   POLYPECTOMY     RECTAL SURGERY  05/28/2006   pre-canerous lesion removed in 2007; Dr Byrd Hesselbach, [place]   TUBAL LIGATION     Patient Active Problem List   Diagnosis Date Noted   Osteoarthritis of right hip 03/29/2023   Viral URI with cough 02/26/2023   Chronic kidney disease, stage 3a (HCC) 01/27/2023   H/O vitrectomy 07/02/2022   Epiretinal membrane, right eye 06/02/2022   Macular pucker, right eye 05/28/2022   HSV-2 (herpes simplex virus 2) infection 04/09/2022   Chronic rhinitis 04/09/2022   Posttraumatic stress disorder 01/07/2022   Arthritis of hand 11/21/2021   History of premalignant rectal tumor 11/13/2021   Degenerative lumbar spinal stenosis 03/03/2021   Lumbar radiculopathy 01/22/2021   Osteoarthritis of both hands 11/10/2020   Carpal tunnel syndrome of left wrist 04/11/2020   Gastroesophageal reflux disease 11/27/2019   Atypical chest pain 11/07/2018  BMI 29.0-29.9,adult 11/07/2018   Dyspnea 11/07/2018   Dupuytren's contracture of both hands 05/30/2018   Constipation 06/08/2016   Decreased visual acuity 06/08/2016   Type 2 diabetes mellitus with retinopathy (HCC) 08/11/2013   Osteopenia 08/19/2012   Hyperlipidemia 06/27/2008   History of colonic polyps 06/27/2008   Recurrent major depressive disorder (HCC) 01/19/2007    PCP: Loyola Mast, MD  REFERRING PROVIDER: Arman Bogus, MD  REFERRING DIAG: M54.16 (ICD-10-CM) - Radiculopathy, lumbar region   Rationale for Evaluation and Treatment: Rehabilitation  THERAPY DIAG:  Muscle weakness (generalized)  Chronic low back pain, unspecified back pain laterality, unspecified whether sciatica present  Pain in right hip  Stiffness of right hip, not elsewhere classified  Muscle spasm of back  Difficulty in walking, not elsewhere classified  ONSET DATE:  03/03/23  SUBJECTIVE:                                                                                                                                                                                           SUBJECTIVE STATEMENT: Doing good. Walking up to a mile and a half, but can not walk fast or she develops spasms.  PERTINENT HISTORY:  Per referring Physician note in April:  doing really well from spine standpoint, no real back pain, no leg pain or NTW, still has R groin pain with wt bearing, was told at [facility] she has &#34;arthritis&#34; there. had xrays in Feb which show mild degen changes   PAIN:  Are you having pain? Yes: NPRS scale: 0/10 Pain location: R posterior hip, piriformis area. Pain description: aching Aggravating factors: Standing after prolong sitting Relieving factors: A little bit of motion helps  PRECAUTIONS: Back  WEIGHT BEARING RESTRICTIONS: No  FALLS:  Has patient fallen in last 6 months? 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  PLOF: Independent  PATIENT GOALS: Wants to get by to walking and hiking, get into the gym  NEXT MD VISIT: No follow up scheduled  OBJECTIVE:   DIAGNOSTIC FINDINGS:  N/A  PATIENT SURVEYS:  FOTO 52  SCREENING FOR RED FLAGS: Bowel or bladder incontinence: No Spinal tumors: No Cauda equina syndrome: No Compression fracture: No Abdominal aneurysm: No  COGNITION: Overall cognitive status: Within functional limits for tasks assessed     SENSATION: WFL  MUSCLE LENGTH: Hamstrings: Right 70 deg; Left 75 deg Thomas test: Tight B hip flexors  POSTURE: No Significant postural limitations  PALPATION: TTP R QL, gluts, piriformis, ITB  LUMBAR ROM:  Reports tightness at end range with all motion, but no real pain.  AROM eval  Flexion To toes   Extension 100%  Right lateral flexion To knee  Left lateral flexion To knee  Right rotation 80%  Left rotation 80%   (Blank rows = not tested)  LOWER EXTREMITY ROM:   Generally WNL, some pain at end of range with R hip motion.   LOWER EXTREMITY MMT:  LLE 5/5, R limited by pain  MMT Right eval Left eval  Hip flexion 3+   Hip extension 3   Hip abduction 4-   Hip adduction    Hip internal rotation    Hip external rotation 3+   Knee flexion 4   Knee extension 4   Ankle dorsiflexion 4   Ankle plantarflexion    Ankle inversion    Ankle eversion       LUMBAR SPECIAL TESTS:  Straight leg raise test: Positive, Slump test: Positive, and Single leg stance test: Positive  FUNCTIONAL TESTS:  5 times sit to stand: 15.58  GAIT: Distance walked: In clinic distances Assistive device utilized: None Level of assistance: Complete Independence Comments: Mildly antalgic gait pattern with decrease WB through R hip.  TODAY'S TREATMENT:                                                                                                                              DATE:  04/30/23 Bike L3 x 6 minutes SI assessment- R post rotation noted ME to activate R HS for correction of SI misalignment, with improved alignment noted afterwards. Isometric hip abd/add for stability. Supine bridge with G tband at knees to engage outer hips, 10 reps Supine stretch for inner and outer R hip using strap, 3 x 15 sec each   04/15/23 NuStep L 5 x 6 min CHECKED Goals  Forward and lateral step ups 4in box on airex x 10 each  30lb resisted side step x5 each HS curls 25lb 2x15 Leg Ext 10lb 2x15  04/05/23 Bike L 2 x 5 min Resisted gait 30lb 4 way x 3 each Forward and lateral 6in step ups x 10 each  HS curls 35lb 2x10 Leg Ext 10lb 2x10 Seated hip abd blue 2x15 Seated HS stretch  03/26/23 NuStep L5 x 6 minutes STM to R ITB and piriformis Supine hip stretch, figure 4, ITB, gluts, 3 way HS stretch with  strap Hip stabilization exercises in supine, ball squeeze, clamshell, bridge with hip abd against G tband, and bridge with ball squeeze, 10 each  03/23/23 NuStep L5 x 6 minutes R hip PROM with end range holds  Supine bridges Hooklying march w/ ab set LE on Pball bridges, K2C, Oblq Supine hip add x10 Ball squeeze bridge x10 stopped after 6 reps due to pain ITB stretch  HS curls 20lb 2x10 Leg Ext 5ln 2x10   03/19/23 NuStep L4 x 6 minutes Stretch to hip ext and ER Supine bridge x 10 Supine  bridge over ball with B hip IR x 10. Bridge with ball roll R/L x 5. Alternately lower one leg to the mat and lift back while stabilizing ball with opposite leg, 10 reps each side. Seated mini squats with G tband at knees B side to side step with G tband at knees. Seated figure 4, HS, and glut stretch B  03/16/23 Bike L2 x6 min  S2S 2x10  Hamstring curls 25lb 2x10 Leg Ext 5lb 2x10 Rows green 2x10 Shoulder Ext green 2x10  Passive stretching to Hamstrings, Single K2C, piriformis, lower trunk rotations.  03/10/23   PATIENT EDUCATION:  Education details: POC Person educated: Patient Education method: Explanation Education comprehension: verbalized understanding  HOME EXERCISE PROGRAM: [email or web address]  ASSESSMENT:  CLINICAL IMPRESSION: Patient reports pinching pain in her R groin after she has raised her R leg into hip flexion to wash her toes. SI assessment shows post rotation of the R anominate. Provided some ME to correct the rotation and then followed up with stabilization and stretching to try to maintain the position.  OBJECTIVE IMPAIRMENTS: Abnormal gait, decreased activity tolerance, decreased balance, decreased coordination, difficulty walking, decreased ROM, decreased strength, impaired flexibility, postural dysfunction, and pain.   ACTIVITY LIMITATIONS: carrying, lifting, bending, standing, squatting, stairs, and locomotion level  PARTICIPATION LIMITATIONS: meal prep, cleaning, laundry,  and community activity  PERSONAL FACTORS: Past/current experiences are also affecting patient's functional outcome.   REHAB POTENTIAL: Good  CLINICAL DECISION MAKING: Stable/uncomplicated  EVALUATION COMPLEXITY: Low   GOALS: Goals reviewed with patient? Yes  SHORT TERM GOALS: Target date: 03/24/23  I with intiial HEP Baseline: Goal status: 03/26/23-met  LONG TERM GOALS: Target date: 9\11\24  I with final HEP Baseline:  Goal status: INITIAL  2.  Increase FOTO score to at least 58 Baseline: 52 Goal status: INITIAL  3.  Decrease 5x STS to < 12 sec Baseline: 15.4 Goal status: Met 9.29 sec 04/15/23  4.  Patient will walk on unlevel surfaces x at least 1000' with pain < 3/10 Baseline: Able to walk 1.6m at a reasonable pace. She uses a walking stick, no pain. Met  5.  Patient will report pain of < 3/10 with any of her normal daily activities, not having to stop and sit due to pain. Baseline: Limited activity, has to sit down after about 15 min of standing or walking. Goal status: Progressing 04/15/23  6.  Patient will be able to get down to the floor and back up MI, with confidence. Baseline: Fearful to get down to the floor due to difficulty rising. Goal status: Met, has been getting on the floor at home to do bridges 04/15/23  PLAN:  PT FREQUENCY: 1-2x/week  PT DURATION: 10 weeks  PLANNED INTERVENTIONS: Therapeutic exercises, Therapeutic activity, Neuromuscular re-education, Balance training, Gait training, Patient/Family education, Self Care, Joint mobilization, Stair training, Dry Needling, Electrical stimulation, Spinal mobilization, Cryotherapy, Moist heat, Ionotophoresis 4mg /ml Dexamethasone, and Manual therapy.  PLAN FOR NEXT SESSION: 03/26/23- SI assessment?  Oley Balm DPT 04/30/23 10:13 AM

## 2023-05-05 ENCOUNTER — Ambulatory Visit (INDEPENDENT_AMBULATORY_CARE_PROVIDER_SITE_OTHER): Payer: Medicare HMO | Admitting: Obstetrics and Gynecology

## 2023-05-05 ENCOUNTER — Encounter: Payer: Self-pay | Admitting: Obstetrics and Gynecology

## 2023-05-05 VITALS — BP 130/86 | HR 72 | Ht 63.5 in | Wt 168.0 lb

## 2023-05-05 DIAGNOSIS — N950 Postmenopausal bleeding: Secondary | ICD-10-CM

## 2023-05-10 ENCOUNTER — Emergency Department (HOSPITAL_BASED_OUTPATIENT_CLINIC_OR_DEPARTMENT_OTHER)
Admission: EM | Admit: 2023-05-10 | Discharge: 2023-05-10 | Disposition: A | Discharge status: Home / Self Care | Payer: Medicare HMO | Attending: Emergency Medicine | Admitting: Emergency Medicine

## 2023-05-10 ENCOUNTER — Other Ambulatory Visit: Payer: Self-pay

## 2023-05-10 ENCOUNTER — Encounter (HOSPITAL_BASED_OUTPATIENT_CLINIC_OR_DEPARTMENT_OTHER): Payer: Self-pay

## 2023-05-10 DIAGNOSIS — N390 Urinary tract infection, site not specified: Secondary | ICD-10-CM | POA: Diagnosis not present

## 2023-05-10 DIAGNOSIS — R300 Dysuria: Secondary | ICD-10-CM | POA: Diagnosis not present

## 2023-05-10 LAB — URINALYSIS, ROUTINE W REFLEX MICROSCOPIC
Bilirubin Urine: NEGATIVE
Glucose, UA: 100 mg/dL — AB
Ketones, ur: NEGATIVE mg/dL
Nitrite: POSITIVE — AB
Protein, ur: 30 mg/dL — AB
Specific Gravity, Urine: 1.02 (ref 1.005–1.030)
pH: 7 (ref 5.0–8.0)

## 2023-05-10 LAB — URINALYSIS, MICROSCOPIC (REFLEX): WBC, UA: >50 WBC/hpf (ref 0–5)

## 2023-05-10 MED ORDER — CEPHALEXIN 500 MG PO CAPS: 500.0000 mg | ORAL_CAPSULE | Freq: Two times a day (BID) | ORAL | 0 refills | Status: AC

## 2023-05-10 MED ORDER — CEPHALEXIN 250 MG PO CAPS
500.0000 mg | ORAL_CAPSULE | Freq: Once | ORAL | Status: AC
  Administered 2023-05-10: 500 mg via ORAL
  Filled 2023-05-10: qty 2

## 2023-05-10 NOTE — ED Provider Notes (Signed)
[facility] Provider Note   CSN: [id number] Arrival date & time: 05/10/23  1478     History  Chief Complaint  Patient presents with   Dysuria    Destiny Romero is a 72 y.o. female.   Dysuria    72 year old female presenting to the emergency department with symptoms of burning while urinating and urinary frequency since yesterday.  She has had some blood noted in her urine.  She has a history of UTIs and this feels similar.  She denies any fevers, chills, flank pain.  No nausea or vomiting.  She endorses some suprapubic discomfort.  Home Medications Prior to Admission medications   Medication Sig Start Date End Date Taking? Authorizing Provider  cephALEXin (KEFLEX) 500 MG capsule Take 1 capsule (500 mg total) by mouth 2 (two) times daily for 5 days. 05/10/23 05/15/23 Yes Ernie Avena, MD  azelastine (ASTELIN) 0.1 % nasal spray Place 1 spray into both nostrils 2 (two) times daily. Use in each nostril as directed 02/26/23   Loyola Mast, MD  Calcium 200 MG TABS Take by mouth. Tale [id number] mg daily    [provider]  cetirizine (ZYRTEC) 10 MG tablet Take by mouth.    [provider]  Cholecalciferol (VITAMIN D) 2000 units CAPS daily. 01/05/18   [provider]  estradiol (ESTRACE) 0.1 MG/GM vaginal cream Place 1 g vaginally 2 (two) times a week. 06/19/22   [provider]  famotidine (PEPCID) 40 MG tablet TAKE 1 TABLET BY MOUTH EVERYDAY AT BEDTIME 02/04/23   Loyola Mast, MD  glimepiride (AMARYL) 1 MG tablet Take 1 tablet (1 mg total) by mouth in the morning and at bedtime. 11/09/22   Loyola Mast, MD  Glucosamine-Chondroitin 1500-1200 MG/30ML LIQD daily.    [provider]  lamoTRIgine (LAMICTAL) 200 MG tablet Take 200 mg by mouth daily.    [provider]  levocetirizine (XYZAL ALLERGY 24HR) 5 MG tablet Take 5 mg by mouth every evening. 03/01/23   [provider]  lidocaine  (XYLOCAINE) 5 % ointment Apply 1 application. topically as needed. 11/24/21   Olivia Mackie, NP  Multiple Vitamin (MULTI VITAMIN) TABS daily.    [provider]  omega-3 acid ethyl esters (LOVAZA) 1 g capsule Take by mouth daily.    [provider]  OneTouch Delica Lancets 33G MISC Use to test blood sugar 3X daily.  Dx Code: E11.65 01/27/23   Loyola Mast, MD  [facility] VERIO test strip USE AS INSTRUCTED 04/07/23   Loyola Mast, MD  pioglitazone (ACTOS) 30 MG tablet Take 1 tablet (30 mg total) by mouth daily. 12/28/22   Loyola Mast, MD  rosuvastatin (CRESTOR) 5 MG tablet Take 1 tablet (5 mg total) by mouth daily. 12/23/22   Loyola Mast, MD  TURMERIC PO Take by mouth.    [provider]      Allergies    Cortisone, Short ragweed pollen ext, Grass pollen(k-o-r-t-swt vern), Other, Pseudoephedrine, Atorvastatin, Codeine, Fluoxetine hcl, Metformin, Metformin and related, Pramipexole, and Shellfish allergy    Review of Systems   Review of Systems  Genitourinary:  Positive for dysuria.  All other systems reviewed and are negative.   Physical Exam Updated Vital Signs BP 121/62 (BP Location: Left Arm)   Pulse (!) 58   Temp 98 F (36.7 C) (Oral)   Resp 17   Wt 78 kg   LMP 04/07/2001 (Approximate)   SpO2  95%   BMI 29.99 kg/m  Physical Exam Vitals and nursing note reviewed.  Constitutional:      General: She is not in acute distress.    Appearance: She is well-developed.  HENT:     Head: Normocephalic and atraumatic.  Eyes:     Conjunctiva/sclera: Conjunctivae normal.  Cardiovascular:     Rate and Rhythm: Normal rate and regular rhythm.     Heart sounds: No murmur heard. Pulmonary:     Effort: Pulmonary effort is normal. No respiratory distress.     Breath sounds: Normal breath sounds.  Abdominal:     Palpations: Abdomen is soft.     Tenderness: There is no abdominal tenderness. There is no right CVA tenderness or left CVA tenderness.   Musculoskeletal:        General: No swelling.     Cervical back: Neck supple.  Skin:    General: Skin is warm and dry.     Capillary Refill: Capillary refill takes less than 2 seconds.  Neurological:     Mental Status: She is alert.  Psychiatric:        Mood and Affect: Mood normal.     ED Results / Procedures / Treatments   Labs (all labs ordered are listed, but only abnormal results are displayed) Labs Reviewed  URINALYSIS, ROUTINE W REFLEX MICROSCOPIC - Abnormal; Notable for the following components:      Result Value   APPearance CLOUDY (*)    Glucose, UA 100 (*)    Hgb urine dipstick MODERATE (*)    Protein, ur 30 (*)    Nitrite POSITIVE (*)    Leukocytes,Ua LARGE (*)    All other components within normal limits  URINALYSIS, MICROSCOPIC (REFLEX) - Abnormal; Notable for the following components:   Bacteria, UA FEW (*)    All other components within normal limits  URINE CULTURE    EKG None  Radiology No results found.  Procedures Procedures    Medications Ordered in ED Medications - No data to display  ED Course/ Medical Decision Making/ A&P                                 Medical Decision Making Amount and/or Complexity of Data Reviewed Labs: ordered.     72 year old female presenting to the emergency department with symptoms of burning while urinating and urinary frequency since yesterday.  She has had some blood noted in her urine.  She has a history of UTIs and this feels similar.  She denies any fevers, chills, flank pain.  No nausea or vomiting.  She endorses some suprapubic discomfort.  On arrival, the patient was afebrile, not tachycardic or tachypneic, hemodynamically stable.  Patient presenting with concern for UTI.  Low concern for pyelonephritis, no CVA tenderness on exam, no significant abdominal tenderness.  Low concern for other acute intra-abdominal emergency.  Analysis was performed which revealed cloudy urine, moderate hemoglobin,  positive nitrites and leukocytes with bacteria present consistent with urinary tract infection.  Patient without CVA tenderness or flank pain to suggest nephrolithiasis.  Do not think emergent CT imaging is indicated at this time.  Will treat with a course of antibiotics, return precautions provided in the event of development of pyelonephritis or worsening pain suggestive of a kidney stone.  Stable for discharge and outpatient management.   Final Clinical Impression(s) / ED Diagnoses Final diagnoses:  Lower urinary tract infectious disease  Rx / DC Orders ED Discharge Orders          Ordered    cephALEXin (KEFLEX) 500 MG capsule  2 times daily        05/10/23 1201              Ernie Avena, MD 05/10/23 1201

## 2023-05-10 NOTE — ED Triage Notes (Signed)
Pt reports burning and urinary frequency since yesterday. Blood in urine . Last UTI march. Denies fever,chills, N/V. Mild abdominal discomfort

## 2023-05-10 NOTE — Discharge Instructions (Signed)
Your symptoms are consistent with urinary tract infection and your abdominal exam is reassuring.  Return to the emergency room if you develop fever, chills despite outpatient antibiotics, severe flank pain or tenderness or worsening abdominal pain or any other concerns or complaints.

## 2023-05-10 NOTE — ED Notes (Signed)
Lab notified to add-on urine culture to previously collected urine sample.  

## 2023-05-12 LAB — URINE CULTURE: Culture: 80000 — AB

## 2023-05-13 ENCOUNTER — Encounter: Payer: Self-pay | Admitting: Physical Therapy

## 2023-05-13 ENCOUNTER — Ambulatory Visit: Payer: Medicare HMO | Attending: Family Medicine | Admitting: Physical Therapy

## 2023-05-13 DIAGNOSIS — M62830 Muscle spasm of back: Secondary | ICD-10-CM | POA: Insufficient documentation

## 2023-05-13 DIAGNOSIS — R262 Difficulty in walking, not elsewhere classified: Secondary | ICD-10-CM | POA: Diagnosis not present

## 2023-05-13 DIAGNOSIS — F339 Major depressive disorder, recurrent, unspecified: Secondary | ICD-10-CM | POA: Diagnosis not present

## 2023-05-13 DIAGNOSIS — G8929 Other chronic pain: Secondary | ICD-10-CM | POA: Diagnosis not present

## 2023-05-13 DIAGNOSIS — M25651 Stiffness of right hip, not elsewhere classified: Secondary | ICD-10-CM | POA: Insufficient documentation

## 2023-05-13 DIAGNOSIS — M25551 Pain in right hip: Secondary | ICD-10-CM | POA: Insufficient documentation

## 2023-05-13 DIAGNOSIS — M545 Low back pain, unspecified: Secondary | ICD-10-CM | POA: Diagnosis not present

## 2023-05-13 DIAGNOSIS — M6281 Muscle weakness (generalized): Secondary | ICD-10-CM | POA: Diagnosis not present

## 2023-05-13 DIAGNOSIS — F419 Anxiety disorder, unspecified: Secondary | ICD-10-CM | POA: Diagnosis not present

## 2023-05-13 DIAGNOSIS — F4310 Post-traumatic stress disorder, unspecified: Secondary | ICD-10-CM | POA: Diagnosis not present

## 2023-05-13 NOTE — Telephone Encounter (Signed)
Post ED Visit - Positive Culture Follow-up  Culture report reviewed by antimicrobial stewardship pharmacist: [facility] [x]  Atomwen Ambrose Pancoast, Pharm.D. []  Celedonio Miyamoto, Pharm.D., BCPS AQ-ID []  Garvin Fila, Pharm.D., BCPS []  Georgina Pillion, Pharm.D., BCPS []  [place], [address].D., BCPS, AAHIVP []  Estella Husk, Pharm.D., BCPS, AAHIVP []  Lysle Pearl, PharmD, BCPS []  Phillips Climes, PharmD, BCPS []  Agapito Games, PharmD, BCPS []  Verlan Friends, PharmD []  Mervyn Gay, PharmD, BCPS []  Vinnie Level, PharmD  [facility] []  Len Childs, PharmD []  Greer Pickerel, PharmD []  Adalberto Cole, PharmD []  Perlie Gold, Rph []  Lonell Face) Jean Rosenthal, PharmD []  Earl Many, PharmD []  Junita Push, PharmD []  Dorna Leitz, PharmD []  Terrilee Files, PharmD []  Lynann Beaver, PharmD []  Keturah Barre, PharmD []  Loralee Pacas, PharmD []  Bernadene Person, PharmD   Positive urine culture Treated with Cephalexin, organism sensitive to the same and no further patient follow-up is required at this time.  Nena Polio Garner Nash 05/13/2023, 11:01 AM

## 2023-05-13 NOTE — Therapy (Signed)
OUTPATIENT PHYSICAL THERAPY THORACOLUMBAR EVALUATION   Patient Name: Destiny Romero MRN: [id number] DOB:07/23/1951, 72 y.o., female Today's Date: 05/13/2023  END OF SESSION:  PT End of Session - 05/13/23 0806     Visit Number 9    Date for PT Re-Evaluation 05/19/23    PT Start Time 0802    PT Stop Time 0842    PT Time Calculation (min) 40 min    Activity Tolerance Patient tolerated treatment well    Behavior During Therapy [facility] for tasks assessed/performed                Past Medical History:  Diagnosis Date   Allergy    Anal lesion 2007   Anemia    Anesthesia complication    Per pt/ sensitive to sedation!   Arthritis    fingers,toes   Cancer (HCC)    "PRECANCEROUS LESIONS IN RECTUM"   Cataract    bilateral,removed   Central retinal artery occlusion 06/08/2016   limited vision right eye.   Cervical dysplasia 04/1991   Dr Laurena Bering - CIN I, cone and freeze in 1990s with normal paps since   Constipation 06/08/2016   moves bowels every 3 rd day    Decreased visual acuity 06/08/2016   Retinal bleed right eye 2017   Depression    Diabetes mellitus without complication (HCC)    Fatigue 06/27/2017   H/O measles    H/O mumps    History of chicken pox    Hyperlipidemia    Mitral valve prolapse    NO MR; no SBE prophylaxis needed   Osteoporosis    PONV (postoperative nausea and vomiting)    Pre-diabetes    highest A1c 6.4 %   Preventative health care 12/21/2016   Squamous cell carcinoma in situ of skin    Dr Hortense Ramal   STD (sexually transmitted disease)    HSV   Vision impairment    limited vision in right eye.   Past Surgical History:  Procedure Laterality Date   CARPAL TUNNEL RELEASE Right 05/2020   CATARACT EXTRACTION, BILATERAL Bilateral    cataracts   CERVICAL CONE BIOPSY     COLONOSCOPY  2003   COLONOSCOPY  04/01/2011   [facility] - repeat in 5 years   COLONOSCOPY W/ POLYPECTOMY  2007   Dr Kinnie Scales   EXCISION MORTON'S NEUROMA Right    EYE SURGERY      07/02/22, 05/31/22   LAMINECTOMY     1982   LUMBAR LAMINECTOMY Left 11/2022   L4-L5   OOPHORECTOMY Left 12/2011   Dr Nicholas Lose ; ovarian cyst   POLYPECTOMY     RECTAL SURGERY  05/28/2006   pre-canerous lesion removed in 2007; Dr Byrd Hesselbach, [place]   TUBAL LIGATION     Patient Active Problem List   Diagnosis Date Noted   Osteoarthritis of right hip 03/29/2023   Viral URI with cough 02/26/2023   Chronic kidney disease, stage 3a (HCC) 01/27/2023   H/O vitrectomy 07/02/2022   Epiretinal membrane, right eye 06/02/2022   Macular pucker, right eye 05/28/2022   HSV-2 (herpes simplex virus 2) infection 04/09/2022   Chronic rhinitis 04/09/2022   Posttraumatic stress disorder 01/07/2022   Arthritis of hand 11/21/2021   History of premalignant rectal tumor 11/13/2021   Degenerative lumbar spinal stenosis 03/03/2021   Lumbar radiculopathy 01/22/2021   Osteoarthritis of both hands 11/10/2020   Carpal tunnel syndrome of left wrist 04/11/2020   Gastroesophageal reflux disease 11/27/2019   Atypical chest pain 11/07/2018  BMI 29.0-29.9,adult 11/07/2018   Dyspnea 11/07/2018   Dupuytren's contracture of both hands 05/30/2018   Constipation 06/08/2016   Decreased visual acuity 06/08/2016   Type 2 diabetes mellitus with retinopathy (HCC) 08/11/2013   Osteopenia 08/19/2012   Hyperlipidemia 06/27/2008   History of colonic polyps 06/27/2008   Recurrent major depressive disorder (HCC) 01/19/2007    PCP: Loyola Mast, MD  REFERRING PROVIDER: Arman Bogus, MD  REFERRING DIAG: M54.16 (ICD-10-CM) - Radiculopathy, lumbar region   Rationale for Evaluation and Treatment: Rehabilitation  THERAPY DIAG:  Muscle weakness (generalized)  Chronic low back pain, unspecified back pain laterality, unspecified whether sciatica present  Pain in right hip  Stiffness of right hip, not elsewhere classified  Difficulty in walking, not elsewhere classified  ONSET DATE: 03/03/23  SUBJECTIVE:                                                                                                                                                                                            SUBJECTIVE STATEMENT: Doing good. Walking up to a mile and a half, but can not walk fast or she develops spasms.  PERTINENT HISTORY:  Per referring Physician note in April:  doing really well from spine standpoint, no real back pain, no leg pain or NTW, still has R groin pain with wt bearing, was told at [facility] she has &#34;arthritis&#34; there. had xrays in Feb which show mild degen changes   PAIN:  Are you having pain? Yes: NPRS scale: 0/10 Pain location: R posterior hip, piriformis area. Pain description: aching Aggravating factors: Standing after prolong sitting Relieving factors: A little bit of motion helps  PRECAUTIONS: Back  WEIGHT BEARING RESTRICTIONS: No  FALLS:  Has patient fallen in last 6 months? 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  PLOF: Independent  PATIENT GOALS: Wants to get by to walking and hiking, get into the gym  NEXT MD VISIT: No follow up scheduled  OBJECTIVE:   DIAGNOSTIC FINDINGS:  N/A  PATIENT SURVEYS:  FOTO 52  SCREENING FOR RED FLAGS: Bowel or bladder incontinence: No Spinal tumors: No Cauda equina syndrome: No Compression fracture: No Abdominal aneurysm: No  COGNITION: Overall cognitive status: Within functional limits for tasks assessed     SENSATION: WFL  MUSCLE LENGTH: Hamstrings: Right 70 deg; Left 75 deg Thomas test: Tight B hip flexors  POSTURE: No Significant postural limitations  PALPATION: TTP R QL, gluts, piriformis, ITB  LUMBAR ROM: Reports tightness at end range  with all motion, but no real pain.  AROM eval  Flexion To toes  Extension 100%  Right lateral flexion To  knee  Left lateral flexion To knee  Right rotation 80%  Left rotation 80%   (Blank rows = not tested)  LOWER EXTREMITY ROM:   Generally WNL, some pain at end of range with R hip motion.   LOWER EXTREMITY MMT:  LLE 5/5, R limited by pain  MMT Right eval Left eval  Hip flexion 3+   Hip extension 3   Hip abduction 4-   Hip adduction    Hip internal rotation    Hip external rotation 3+   Knee flexion 4   Knee extension 4   Ankle dorsiflexion 4   Ankle plantarflexion    Ankle inversion    Ankle eversion       LUMBAR SPECIAL TESTS:  Straight leg raise test: Positive, Slump test: Positive, and Single leg stance test: Positive  FUNCTIONAL TESTS:  5 times sit to stand: 15.58  GAIT: Distance walked: In clinic distances Assistive device utilized: None Level of assistance: Complete Independence Comments: Mildly antalgic gait pattern with decrease WB through R hip.  TODAY'S TREATMENT:                                                                                                                              DATE:  05/13/23 NuStep L5 x 6 minutes R anominate post rotation Supine R hip flex ME Supine isometric abd/add for stability Education for self correction at home with exercise and sacral belt.  04/30/23 Bike L3 x 6 minutes SI assessment- R post rotation noted ME to activate R hip flex for correction of SI misalignment, with improved alignment noted afterwards. Isometric hip abd/add for stability. Supine bridge with G tband at knees to engage outer hips, 10 reps Supine stretch for inner and outer R hip using strap, 3 x 15 sec each   04/15/23 NuStep L 5 x 6 min CHECKED Goals  Forward and lateral step ups 4in box on airex x 10 each  30lb resisted side step x5 each HS curls 25lb 2x15 Leg Ext 10lb 2x15  04/05/23 Bike L 2 x 5 min Resisted gait 30lb 4 way x 3 each Forward and lateral 6in step ups x 10 each  HS curls 35lb 2x10 Leg Ext 10lb 2x10 Seated hip abd blue  2x15 Seated HS stretch  03/26/23 NuStep L5 x 6 minutes STM to R ITB and piriformis Supine hip stretch, figure 4, ITB, gluts, 3 way HS stretch with strap Hip stabilization exercises in supine, ball squeeze, clamshell, bridge with hip abd against G tband, and bridge with ball squeeze, 10 each  03/23/23 NuStep L5 x 6 minutes R hip PROM with end range holds  Supine bridges Hooklying march w/ ab set LE on Pball bridges, K2C, Oblq Supine hip add x10 Ball squeeze bridge x10 stopped after 6 reps due to pain ITB stretch  HS curls 20lb 2x10 Leg Ext 5ln 2x10  PATIENT EDUCATION:  Education details: POC Person educated: Patient Education method: Explanation Education comprehension: verbalized understanding  HOME EXERCISE PROGRAM: [id number]  ASSESSMENT:  CLINICAL IMPRESSION: Patient reports continued intermittent pain in R hip. R anominate post rotation noted again, so corrected with ME, then educated patient to technqiues for treatment at home and recommended sacral bet to stabilize pelvis and allow healing.  OBJECTIVE IMPAIRMENTS: Abnormal gait, decreased activity tolerance, decreased balance, decreased coordination, difficulty walking, decreased ROM, decreased strength, impaired flexibility, postural dysfunction, and pain.   ACTIVITY LIMITATIONS: carrying, lifting, bending, standing, squatting, stairs, and locomotion level  PARTICIPATION LIMITATIONS: meal prep, cleaning, laundry, and community activity  PERSONAL FACTORS: Past/current experiences are also affecting patient's functional outcome.   REHAB POTENTIAL: Good  CLINICAL DECISION MAKING: Stable/uncomplicated  EVALUATION COMPLEXITY: Low   GOALS: Goals reviewed with patient? Yes  SHORT TERM GOALS: Target date: 03/24/23  I with intiial HEP Baseline: Goal status: 03/26/23-met  LONG TERM GOALS: Target date: 9\11\24  I with final HEP Baseline:  Goal status: INITIAL  2.  Increase FOTO score to at least 58 Baseline:  52 Goal status: INITIAL  3.  Decrease 5x STS to < 12 sec Baseline: 15.4 Goal status: Met 9.29 sec 04/15/23  4.  Patient will walk on unlevel surfaces x at least 1000' with pain < 3/10 Baseline: Able to walk 1.75m at a reasonable pace. She uses a walking stick, no pain. Met  5.  Patient will report pain of < 3/10 with any of her normal daily activities, not having to stop and sit due to pain. Baseline: Limited activity, has to sit down after about 15 min of standing or walking. Goal status:  05/13/23-Still catches unexpectedly, especially if she flexes hip > 90, ongoing  6.  Patient will be able to get down to the floor and back up MI, with confidence. Baseline: Fearful to get down to the floor due to difficulty rising. Goal status: Met, has been getting on the floor at home to do bridges 04/15/23  PLAN:  PT FREQUENCY: 1-2x/week  PT DURATION: 10 weeks  PLANNED INTERVENTIONS: Therapeutic exercises, Therapeutic activity, Neuromuscular re-education, Balance training, Gait training, Patient/Family education, Self Care, Joint mobilization, Stair training, Dry Needling, Electrical stimulation, Spinal mobilization, Cryotherapy, Moist heat, Ionotophoresis 4mg /ml Dexamethasone, and Manual therapy.  PLAN FOR NEXT SESSION:  SI assessment?  Oley Balm DPT 05/13/23 8:45 AM

## 2023-05-20 ENCOUNTER — Encounter: Payer: Self-pay | Admitting: Physical Therapy

## 2023-05-20 ENCOUNTER — Ambulatory Visit: Payer: Medicare HMO | Admitting: Physical Therapy

## 2023-05-20 DIAGNOSIS — R262 Difficulty in walking, not elsewhere classified: Secondary | ICD-10-CM | POA: Diagnosis not present

## 2023-05-20 DIAGNOSIS — M25651 Stiffness of right hip, not elsewhere classified: Secondary | ICD-10-CM | POA: Diagnosis not present

## 2023-05-20 DIAGNOSIS — M25551 Pain in right hip: Secondary | ICD-10-CM

## 2023-05-20 DIAGNOSIS — M62830 Muscle spasm of back: Secondary | ICD-10-CM | POA: Diagnosis not present

## 2023-05-20 DIAGNOSIS — M545 Low back pain, unspecified: Secondary | ICD-10-CM

## 2023-05-20 DIAGNOSIS — G8929 Other chronic pain: Secondary | ICD-10-CM | POA: Diagnosis not present

## 2023-05-20 DIAGNOSIS — M6281 Muscle weakness (generalized): Secondary | ICD-10-CM | POA: Diagnosis not present

## 2023-05-20 NOTE — Therapy (Signed)
OUTPATIENT PHYSICAL THERAPY THORACOLUMBAR EVALUATION  PHYSICAL THERAPY DISCHARGE SUMMARY  Visits from Start of Care: 10  Current functional level related to goals / functional outcomes: Goals met   Remaining deficits: Occasional exacerbation-Patient has tools to manage any flare ups   Education / Equipment: HEP   Patient agrees to discharge. Patient goals were met. Patient is being discharged due to meeting the stated rehab goals.   Patient Name: Destiny Romero MRN: [id number] DOB:04-15-1951, 72 y.o., female Today's Date: 05/20/2023  END OF SESSION:  PT End of Session - 05/20/23 0801     Visit Number 10    Date for PT Re-Evaluation 05/19/23                Past Medical History:  Diagnosis Date   Allergy    Anal lesion 2007   Anemia    Anesthesia complication    Per pt/ sensitive to sedation!   Arthritis    fingers,toes   Cancer (HCC)    "PRECANCEROUS LESIONS IN RECTUM"   Cataract    bilateral,removed   Central retinal artery occlusion 06/08/2016   limited vision right eye.   Cervical dysplasia 04/1991   Dr Laurena Bering - CIN I, cone and freeze in 1990s with normal paps since   Constipation 06/08/2016   moves bowels every 3 rd day    Decreased visual acuity 06/08/2016   Retinal bleed right eye 2017   Depression    Diabetes mellitus without complication (HCC)    Fatigue 06/27/2017   H/O measles    H/O mumps    History of chicken pox    Hyperlipidemia    Mitral valve prolapse    NO MR; no SBE prophylaxis needed   Osteoporosis    PONV (postoperative nausea and vomiting)    Pre-diabetes    highest A1c 6.4 %   Preventative health care 12/21/2016   Squamous cell carcinoma in situ of skin    Dr Hortense Ramal   STD (sexually transmitted disease)    HSV   Vision impairment    limited vision in right eye.   Past Surgical History:  Procedure Laterality Date   CARPAL TUNNEL RELEASE Right 05/2020   CATARACT EXTRACTION, BILATERAL Bilateral    cataracts    CERVICAL CONE BIOPSY     COLONOSCOPY  2003   COLONOSCOPY  04/01/2011   [facility] - repeat in 5 years   COLONOSCOPY W/ POLYPECTOMY  2007   Dr Kinnie Scales   EXCISION MORTON'S NEUROMA Right    EYE SURGERY     07/02/22, 05/31/22   LAMINECTOMY     1982   LUMBAR LAMINECTOMY Left 11/2022   L4-L5   OOPHORECTOMY Left 12/2011   Dr Nicholas Lose ; ovarian cyst   POLYPECTOMY     RECTAL SURGERY  05/28/2006   pre-canerous lesion removed in 2007; Dr Byrd Hesselbach, [place]   TUBAL LIGATION     Patient Active Problem List   Diagnosis Date Noted   Osteoarthritis of right hip 03/29/2023   Viral URI with cough 02/26/2023   Chronic kidney disease, stage 3a (HCC) 01/27/2023   H/O vitrectomy 07/02/2022   Epiretinal membrane, right eye 06/02/2022   Macular pucker, right eye 05/28/2022   HSV-2 (herpes simplex virus 2) infection 04/09/2022   Chronic rhinitis 04/09/2022   Posttraumatic stress disorder 01/07/2022   Arthritis of hand 11/21/2021   History of premalignant rectal tumor 11/13/2021   Degenerative lumbar spinal stenosis 03/03/2021   Lumbar radiculopathy 01/22/2021   Osteoarthritis of both hands 11/10/2020  Carpal tunnel syndrome of left wrist 04/11/2020   Gastroesophageal reflux disease 11/27/2019   Atypical chest pain 11/07/2018   BMI 29.0-29.9,adult 11/07/2018   Dyspnea 11/07/2018   Dupuytren's contracture of both hands 05/30/2018   Constipation 06/08/2016   Decreased visual acuity 06/08/2016   Type 2 diabetes mellitus with retinopathy (HCC) 08/11/2013   Osteopenia 08/19/2012   Hyperlipidemia 06/27/2008   History of colonic polyps 06/27/2008   Recurrent major depressive disorder (HCC) 01/19/2007    PCP: Loyola Mast, MD  REFERRING PROVIDER: Arman Bogus, MD  REFERRING DIAG: M54.16 (ICD-10-CM) - Radiculopathy, lumbar region   Rationale for Evaluation and Treatment: Rehabilitation  THERAPY DIAG:  Muscle weakness (generalized)  Chronic low back pain, unspecified back pain laterality,  unspecified whether sciatica present  Pain in right hip  Stiffness of right hip, not elsewhere classified  Difficulty in walking, not elsewhere classified  Muscle spasm of back  ONSET DATE: 03/03/23  SUBJECTIVE:                                                                                                                                                                                           SUBJECTIVE STATEMENT: Have not had the spasms this week, did a mile to a mile and and a half  PERTINENT HISTORY:  Per referring Physician note in April:  doing really well from spine standpoint, no real back pain, no leg pain or NTW, still has R groin pain with wt bearing, was told at [facility] she has &#34;arthritis&#34; there. had xrays in Feb which show mild degen changes   PAIN:  Are you having pain? Yes: NPRS scale: 0/10 Pain location: R posterior hip, piriformis area. Pain description: aching Aggravating factors: Standing after prolong sitting Relieving factors: A little bit of motion helps  PRECAUTIONS: Back  WEIGHT BEARING RESTRICTIONS: No  FALLS:  Has patient fallen in last 6 months? 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  PLOF: Independent  PATIENT GOALS: Wants to get by to walking and hiking, get into the gym  NEXT MD VISIT: No follow up scheduled  OBJECTIVE:   DIAGNOSTIC FINDINGS:  N/A  PATIENT SURVEYS:  FOTO 52  SCREENING FOR RED FLAGS: Bowel or bladder incontinence: No Spinal tumors: No Cauda equina syndrome: No Compression fracture: No Abdominal aneurysm: No  COGNITION: Overall cognitive status: Within functional limits for tasks assessed     SENSATION: WFL  MUSCLE LENGTH: Hamstrings: Right 70 deg; Left 75 deg Thomas test: Tight B  hip flexors  POSTURE: No Significant  postural limitations  PALPATION: TTP R QL, gluts, piriformis, ITB  LUMBAR ROM: Reports tightness at end range with all motion, but no real pain.  AROM eval  Flexion To toes  Extension 100%  Right lateral flexion To knee  Left lateral flexion To knee  Right rotation 80%  Left rotation 80%   (Blank rows = not tested)  LOWER EXTREMITY ROM:   Generally WNL, some pain at end of range with R hip motion.   LOWER EXTREMITY MMT:  LLE 5/5, R limited by pain  MMT Right eval Left eval  Hip flexion 3+   Hip extension 3   Hip abduction 4-   Hip adduction    Hip internal rotation    Hip external rotation 3+   Knee flexion 4   Knee extension 4   Ankle dorsiflexion 4   Ankle plantarflexion    Ankle inversion    Ankle eversion       LUMBAR SPECIAL TESTS:  Straight leg raise test: Positive, Slump test: Positive, and Single leg stance test: Positive  FUNCTIONAL TESTS:  5 times sit to stand: 15.58  GAIT: Distance walked: In clinic distances Assistive device utilized: None Level of assistance: Complete Independence Comments: Mildly antalgic gait pattern with decrease WB through R hip.  TODAY'S TREATMENT:                                                                                                                              DATE:  05/20/23 NuStep L5 x 6 minutes FOTO Went over home stretches to ensure that are performed correctly    05/13/23 NuStep L5 x 6 minutes R anominate post rotation Supine R hip flex ME Supine isometric abd/add for stability Education for self correction at home with exercise and sacral belt.  04/30/23 Bike L3 x 6 minutes SI assessment- R post rotation noted ME to activate R hip flex for correction of SI misalignment, with improved alignment noted afterwards. Isometric hip abd/add for stability. Supine bridge with G tband at knees to engage outer hips, 10 reps Supine stretch for inner and outer R hip using strap, 3 x 15 sec  each   04/15/23 NuStep L 5 x 6 min CHECKED Goals  Forward and lateral step ups 4in box on airex x 10 each  30lb resisted side step x5 each HS curls 25lb 2x15 Leg Ext 10lb 2x15  04/05/23 Bike L 2 x 5 min Resisted gait 30lb 4 way x 3 each Forward and lateral 6in step ups x 10 each  HS curls 35lb 2x10 Leg Ext 10lb 2x10 Seated hip abd blue 2x15 Seated HS stretch  03/26/23 NuStep L5 x 6 minutes STM to R ITB and piriformis Supine hip stretch, figure 4, ITB, gluts, 3 way HS stretch with strap Hip stabilization exercises in supine, ball squeeze, clamshell, bridge with hip abd against G tband, and bridge with ball squeeze, 10 each  03/23/23 NuStep L5 x 6 minutes R hip PROM with end range holds  Supine bridges Hooklying march w/ ab set LE on Pball bridges, K2C, Oblq Supine hip add x10 Ball squeeze bridge x10 stopped after 6 reps due to pain ITB stretch  HS curls 20lb 2x10 Leg Ext 5ln 2x10  PATIENT EDUCATION:  Education details: POC Person educated: Patient Education method: Explanation Education comprehension: verbalized understanding  HOME EXERCISE PROGRAM: [id number]  ASSESSMENT:  CLINICAL IMPRESSION: Patient enters doing well and has met all goals. She is pleased with her current functional status and is confident with continuing her treatmetn on her own. Went over home stretches to ensure that are performed correctly.   OBJECTIVE IMPAIRMENTS: Abnormal gait, decreased activity tolerance, decreased balance, decreased coordination, difficulty walking, decreased ROM, decreased strength, impaired flexibility, postural dysfunction, and pain.   ACTIVITY LIMITATIONS: carrying, lifting, bending, standing, squatting, stairs, and locomotion level  PARTICIPATION LIMITATIONS: meal prep, cleaning, laundry, and community activity  PERSONAL FACTORS: Past/current experiences are also affecting patient's functional outcome.   REHAB POTENTIAL: Good  CLINICAL DECISION MAKING:  Stable/uncomplicated  EVALUATION COMPLEXITY: Low   GOALS: Goals reviewed with patient? Yes  SHORT TERM GOALS: Target date: 03/24/23  I with intiial HEP Baseline: Goal status: 03/26/23-met  LONG TERM GOALS: Target date: 9\11\24  I with final HEP Baseline:  Goal status: Met 05/20/23  2.  Increase FOTO score to at least 58 Baseline: 52 Goal status: Met 61 05/20/23  3.  Decrease 5x STS to < 12 sec Baseline: 15.4 Goal status: Met 9.29 sec 04/15/23  4.  Patient will walk on unlevel surfaces x at least 1000' with pain < 3/10 Baseline: Able to walk 1.56m at a reasonable pace. She uses a walking stick, no pain. Met  5.  Patient will report pain of < 3/10 with any of her normal daily activities, not having to stop and sit due to pain. Baseline: Limited activity, has to sit down after about 15 min of standing or walking. Goal status:  05/13/23-Still catches unexpectedly, especially if she flexes hip > 90, ongoing   2/10 Met "because she is very cautious"   6.  Patient will be able to get down to the floor and back up MI, with confidence. Baseline: Fearful to get down to the floor due to difficulty rising. Goal status: Met, has been getting on the floor at home to do bridges 04/15/23  PLAN:  PT FREQUENCY: 1-2x/week  PT DURATION: 10 weeks  PLANNED INTERVENTIONS: Therapeutic exercises, Therapeutic activity, Neuromuscular re-education, Balance training, Gait training, Patient/Family education, Self Care, Joint mobilization, Stair training, Dry Needling, Electrical stimulation, Spinal mobilization, Cryotherapy, Moist heat, Ionotophoresis 4mg /ml Dexamethasone, and Manual therapy.  PLAN FOR NEXT SESSION:  D/C PT PHYSICAL THERAPY DISCHARGE SUMMARY  Visits from Start of Care: 10  Patient agrees to discharge. Patient goals were met. Patient is being discharged due to meeting the stated rehab goals.  Oley Balm DPT 05/20/23 9:46 AM  Debroah Baller, PTA 05/20/23 9:46 AM

## 2023-05-26 ENCOUNTER — Other Ambulatory Visit (HOSPITAL_COMMUNITY)
Admission: RE | Admit: 2023-05-26 | Discharge: 2023-05-26 | Disposition: A | Discharge status: Home / Self Care | Payer: Medicare HMO | Source: Ambulatory Visit | Attending: Obstetrics and Gynecology | Admitting: Obstetrics and Gynecology

## 2023-05-26 ENCOUNTER — Encounter: Payer: Self-pay | Admitting: Obstetrics and Gynecology

## 2023-05-26 ENCOUNTER — Ambulatory Visit (INDEPENDENT_AMBULATORY_CARE_PROVIDER_SITE_OTHER): Payer: Medicare HMO | Admitting: Obstetrics and Gynecology

## 2023-05-26 VITALS — BP 104/62 | HR 68 | Resp 16

## 2023-05-26 DIAGNOSIS — N950 Postmenopausal bleeding: Secondary | ICD-10-CM | POA: Insufficient documentation

## 2023-05-26 DIAGNOSIS — N858 Other specified noninflammatory disorders of uterus: Secondary | ICD-10-CM | POA: Diagnosis not present

## 2023-05-26 NOTE — Progress Notes (Signed)
GYNECOLOGY  VISIT   HPI: 72 y.o.   Single  Caucasian  female   G0P0000 with Patient's last menstrual period was 04/07/2001 (approximate).   Presenting for consultation from Dr. Edward Jolly with PM bleeding x 2 weeks.  She has completed a SHG and had inconclusive EMB   Seen for sonohysterogram and EMB for postmenopausal bleeding and endometrial mass on 04/08/23.  Pelvic US Uterus 5.00 x 3.58 x 2.13 cm.  Single fundal fibroid, no change.  EMS 1.8 mm. Left ovary absent.  Right ovary 2.31 x 0.93 x 1.20 cm.  Right hydrosalpinx noted and 14 x 8 mm avascular solid structure noted.  No free fluid.    Sonohysterogram Consent done.  Sterile prep with Hibiclens. Canula passed and sterile saline fluid injected into uterine cavity.  No filling defects of uterine cavity or cervix noted.    EMB done 04/08/23 showed focal cytologic atypia of undetermined significance.  The specimen had superficial strips of endometrium without significant stromal component. No definitive hyperplasia were noted.   Her prior pelvic US 01/21/23 that lead to the sonohysterogram/endometrial biopsy appointment: Uterus 4.52 x 3.18 x 1.91 cm.  14 mm intramural fibroid. EMS 2.51 mm.  Avascular.  Partially intracavitary mass? Left ovary absent.  Right ovary 1.46 x 1.25 x 1.04 cm. 3 cysts:  0.92 cm, 0.66 cm, 0.80 cm.  Avascular.  Possible paraovarian cysts.  Right adnexa with fluid filled tubular area, consistent with hydrosalpinx.  No free fluid.   CA125 on 01/21/23:  3.  May be moving to [place] to be closer to family.   GYNECOLOGIC HISTORY: Patient's last menstrual period was 04/07/2001 (approximate). Contraception:  PMP Menopausal hormone therapy:  estrace Last mammogram:  08/11/22 Breast Density Cat A, BI-RADS CAT 1 neg  Last pap smear:   01/18/23 -neg: HR HPV neg,  01/01/22 LSIL: HR HPV neg, 07/01/16 neg         OB History     Gravida  0   Para  0   Term  0   Preterm  0   AB  0   Living  0      SAB  0    IAB  0   Ectopic  0   Multiple  0   Live Births  0              Patient Active Problem List   Diagnosis Date Noted   Osteoarthritis of right hip 03/29/2023   Viral URI with cough 02/26/2023   Chronic kidney disease, stage 3a (HCC) 01/27/2023   H/O vitrectomy 07/02/2022   Epiretinal membrane, right eye 06/02/2022   Macular pucker, right eye 05/28/2022   HSV-2 (herpes simplex virus 2) infection 04/09/2022   Chronic rhinitis 04/09/2022   Posttraumatic stress disorder 01/07/2022   Arthritis of hand 11/21/2021   History of premalignant rectal tumor 11/13/2021   Degenerative lumbar spinal stenosis 03/03/2021   Lumbar radiculopathy 01/22/2021   Osteoarthritis of both hands 11/10/2020   Carpal tunnel syndrome of left wrist 04/11/2020   Gastroesophageal reflux disease 11/27/2019   Atypical chest pain 11/07/2018   BMI 29.0-29.9,adult 11/07/2018   Dyspnea 11/07/2018   Dupuytren's contracture of both hands 05/30/2018   Constipation 06/08/2016   Decreased visual acuity 06/08/2016   Type 2 diabetes mellitus with retinopathy (HCC) 08/11/2013   Osteopenia 08/19/2012   Hyperlipidemia 06/27/2008   History of colonic polyps 06/27/2008   Recurrent major depressive disorder (HCC) 01/19/2007    Past Medical History:  Diagnosis Date  Allergy    Anal lesion 2007   Anemia    Anesthesia complication    Per pt/ sensitive to sedation!   Arthritis    fingers,toes   Cancer (HCC)    "PRECANCEROUS LESIONS IN RECTUM"   Cataract    bilateral,removed   Central retinal artery occlusion 06/08/2016   limited vision right eye.   Cervical dysplasia 04/1991   Dr Laurena Bering - CIN I, cone and freeze in 1990s with normal paps since   Constipation 06/08/2016   moves bowels every 3 rd day    Decreased visual acuity 06/08/2016   Retinal bleed right eye 2017   Depression    Diabetes mellitus without complication (HCC)    Fatigue 06/27/2017   H/O measles    H/O mumps    History of chicken pox     Hyperlipidemia    Mitral valve prolapse    NO MR; no SBE prophylaxis needed   Osteoporosis    PONV (postoperative nausea and vomiting)    Pre-diabetes    highest A1c 6.4 %   Preventative health care 12/21/2016   Squamous cell carcinoma in situ of skin    Dr Hortense Ramal   STD (sexually transmitted disease)    HSV   Vision impairment    limited vision in right eye.    Past Surgical History:  Procedure Laterality Date   CARPAL TUNNEL RELEASE Right 05/2020   CATARACT EXTRACTION, BILATERAL Bilateral    cataracts   CERVICAL CONE BIOPSY     COLONOSCOPY  2003   COLONOSCOPY  04/01/2011   [place] - repeat in 5 years   COLONOSCOPY W/ POLYPECTOMY  2007   Dr Kinnie Scales   EXCISION MORTON'S NEUROMA Right    EYE SURGERY     07/02/22, 05/31/22   LAMINECTOMY     1982   LUMBAR LAMINECTOMY Left 11/2022   L4-L5   OOPHORECTOMY Left 12/2011   Dr Nicholas Lose ; ovarian cyst   POLYPECTOMY     RECTAL SURGERY  05/28/2006   pre-canerous lesion removed in 2007; Dr Byrd Hesselbach, [place]   TUBAL LIGATION      Current Outpatient Medications  Medication Sig Dispense Refill   azelastine (ASTELIN) 0.1 % nasal spray Place 1 spray into both nostrils 2 (two) times daily. Use in each nostril as directed 30 mL 12   Calcium 200 MG TABS Take by mouth. Tale [phone] mg daily     cetirizine (ZYRTEC) 10 MG tablet Take by mouth.     Cholecalciferol (VITAMIN D) 2000 units CAPS daily.     estradiol (ESTRACE) 0.1 MG/GM vaginal cream Place 1 g vaginally 2 (two) times a week.     famotidine (PEPCID) 40 MG tablet TAKE 1 TABLET BY MOUTH EVERYDAY AT BEDTIME 90 tablet 3   glimepiride (AMARYL) 1 MG tablet Take 1 tablet (1 mg total) by mouth in the morning and at bedtime. 180 tablet 3   Glucosamine-Chondroitin 1500-1200 MG/30ML LIQD daily.     lamoTRIgine (LAMICTAL) 200 MG tablet Take 200 mg by mouth daily.     levocetirizine (XYZAL ALLERGY 24HR) 5 MG tablet Take 5 mg by mouth every evening.     lidocaine (XYLOCAINE) 5 % ointment Apply 1  application. topically as needed. 30 g 0   Multiple Vitamin (MULTI VITAMIN) TABS daily.     omega-3 acid ethyl esters (LOVAZA) 1 g capsule Take by mouth daily.     OneTouch Delica Lancets 33G MISC Use to test blood sugar 3X daily.  Dx Code: E11.65  300 each 1   ONETOUCH VERIO test strip USE AS INSTRUCTED 300 strip 12   pioglitazone (ACTOS) 30 MG tablet Take 1 tablet (30 mg total) by mouth daily. 90 tablet 2   rosuvastatin (CRESTOR) 5 MG tablet Take 1 tablet (5 mg total) by mouth daily. 90 tablet 3   TURMERIC PO Take by mouth.     No current facility-administered medications for this visit.     ALLERGIES: Cortisone, Short ragweed pollen ext, Grass pollen(k-o-r-t-swt vern), Other, Pseudoephedrine, Atorvastatin, Codeine, Fluoxetine hcl, Metformin, Metformin and related, Pramipexole, and Shellfish allergy  Family History  Problem Relation Age of Onset   Diabetes Mother    Dementia Mother    Prostate cancer Father    COPD Father    Heart disease Father        CAD - used nitroglycerin tablets   Diabetes Sister        obese   Thyroid cancer Sister    Cancer Brother        non Hodgkin's Lymphoma, leukemia   Diabetes Brother        not obese   Kidney disease Brother        Kidney failure   Diabetes Brother    Kidney failure Brother    Diabetes Maternal Aunt        X5 ; all IDDM   Heart disease Maternal Aunt        "enlarged heart"   Heart disease Maternal Uncle        "enlarged heart"   Diabetes Maternal Grandmother        IDDM   Diabetes Paternal Grandmother        IDDM   Heart disease Paternal Grandmother    Heart disease Paternal Grandfather        CAD - used nitroglycerin tablets   Colon cancer Neg Hx    Esophageal cancer Neg Hx    Stomach cancer Neg Hx    Stroke Neg Hx    Colon polyps Neg Hx    Rectal cancer Neg Hx     Social History   Socioeconomic History   Marital status: Single    Spouse name: Not on file   Number of children: 0   Years of education: Not on  file   Highest education level: Bachelor's degree (e.g., BA, AB, BS)  Occupational History   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  Tobacco Use   Smoking status: Never   Smokeless tobacco: Never  Vaping Use   Vaping status: Never Used  Substance and Sexual Activity   Alcohol use: Yes    Comment: occ   Drug use: No   Sexual activity: Yes    Partners: Male    Birth control/protection: Post-menopausal    Comment: 1st intercourse 72 yo-More than 5 partners  Other Topics Concern   Not on file  Social History Narrative   Lives alone   No dietary restrictions   Social Determinants of Health   Financial Resource Strain: Low Risk  (02/25/2023)   Overall Financial Resource Strain (CARDIA)    Difficulty of Paying Living Expenses: Not hard at all  Food Insecurity: No Food Insecurity (02/25/2023)   Hunger Vital Sign    Worried About Running Out of Food in the Last Year: Never true    Ran Out of Food in the Last Year: Never true  Transportation Needs: No Transportation Needs (02/25/2023)   PRAPARE - Transportation    Lack of Transportation (Medical): No    Lack  of Transportation (Non-Medical): No  Physical Activity: Insufficiently Active (02/25/2023)   Exercise Vital Sign    Days of Exercise per Week: 4 days    Minutes of Exercise per Session: 30 min  Stress: No Stress Concern Present (02/25/2023)   [employer] of Occupational Health - Occupational Stress Questionnaire    Feeling of Stress : Only a little  Recent Concern: Stress - Stress Concern Present (12/21/2022)   [employer] of Occupational Health - Occupational Stress Questionnaire    Feeling of Stress : To some extent  Social Connections: Moderately Integrated (02/25/2023)   Social Connection and Isolation Panel [NHANES]    Frequency of Communication with Friends and Family: Once a week    Frequency of Social Gatherings with Friends and Family: Twice a week    Attends Religious Services: More than 4 times per year     Active Member of [employer] or Organizations: Yes    Attends Banker Meetings: More than 4 times per year    Marital Status: Divorced  Intimate Partner Violence: Not At Risk (02/26/2023)   Humiliation, Afraid, Rape, and Kick questionnaire    Fear of Current or Ex-Partner: No    Emotionally Abused: No    Physically Abused: No    Sexually Abused: No    Review of Systems  All other systems reviewed and are negative.   PHYSICAL EXAMINATION:    LMP 04/07/2001 (Approximate)     General appearance: alert, cooperative and appears stated age   PROCEDURE: EMB Consent obtained for the procedure.  A bivalve speculum was placed in the vagina.  The cervix was grasped with a single tooth tenaculum.  Uterus measured to 6cm.  Pipelle was inserted and rotated x3 to obtain enough material as specimen was scant.  Adequate specimen was obtained and sent to pathology.  All instruments were removed.  Patient tolerated the procedure well.  To notify patient of the results.  ASSESSMENT  Postmenopausal bleeding.  Endometrial biopsy showing atypia with specimen limited for evaluation.  Inconclusive for endometrial hyperplasia.  Right hydrosalpinx with small solid avascular right adnexal mass.  Normal CA125.   Hx of left oophorectomy.   PLAN   Discussed surgical vs. Repeat EMB.  She is in the middle of moving to [place] and will be coming back and forth for medical care here.  Repeat biopsy was sent today.  To notify patient of the results.  She will return with any repeat bleeding. Discussed also with persistent bleeding of having RLH.  She agreed.  Dr. Karma Greaser

## 2023-05-26 NOTE — Addendum Note (Signed)
Addended by: Earley Favor on: 05/26/2023 03:46 PM   Modules accepted: Orders

## 2023-05-28 ENCOUNTER — Encounter: Payer: Self-pay | Admitting: Obstetrics and Gynecology

## 2023-05-28 LAB — SURGICAL PATHOLOGY

## 2023-06-01 DIAGNOSIS — F339 Major depressive disorder, recurrent, unspecified: Secondary | ICD-10-CM | POA: Diagnosis not present

## 2023-06-01 DIAGNOSIS — F4310 Post-traumatic stress disorder, unspecified: Secondary | ICD-10-CM | POA: Diagnosis not present

## 2023-06-09 DIAGNOSIS — F4310 Post-traumatic stress disorder, unspecified: Secondary | ICD-10-CM | POA: Diagnosis not present

## 2023-06-09 DIAGNOSIS — F339 Major depressive disorder, recurrent, unspecified: Secondary | ICD-10-CM | POA: Diagnosis not present

## 2023-06-09 DIAGNOSIS — F419 Anxiety disorder, unspecified: Secondary | ICD-10-CM | POA: Diagnosis not present

## 2023-06-22 NOTE — Telephone Encounter (Signed)
Left message again for pathology to review. Dr. Karma Greaser

## 2023-06-23 ENCOUNTER — Other Ambulatory Visit: Payer: Self-pay

## 2023-06-23 DIAGNOSIS — N950 Postmenopausal bleeding: Secondary | ICD-10-CM

## 2023-06-23 DIAGNOSIS — L292 Pruritus vulvae: Secondary | ICD-10-CM

## 2023-06-23 MED ORDER — ESTRADIOL 0.1 MG/GM VA CREA: 1.0000 g | TOPICAL_CREAM | VAGINAL | 0 refills | Status: AC

## 2023-06-23 NOTE — Telephone Encounter (Signed)
Med refill request: estradiol 0.01% cream Last office visit 05/26/23 Next AEX: 01/01/22 Last MMG (if hormonal med) 08/11/22 Refill sent to provider for approval or denial.

## 2023-06-29 ENCOUNTER — Ambulatory Visit: Payer: Medicare HMO | Admitting: Family Medicine

## 2023-06-29 ENCOUNTER — Encounter: Payer: Self-pay | Admitting: Family Medicine

## 2023-06-29 VITALS — Temp 98.2°F | Ht 63.5 in | Wt 168.8 lb

## 2023-06-29 DIAGNOSIS — E11319 Type 2 diabetes mellitus with unspecified diabetic retinopathy without macular edema: Secondary | ICD-10-CM

## 2023-06-29 DIAGNOSIS — N1831 Chronic kidney disease, stage 3a: Secondary | ICD-10-CM

## 2023-06-29 DIAGNOSIS — Z7984 Long term (current) use of oral hypoglycemic drugs: Secondary | ICD-10-CM

## 2023-06-29 DIAGNOSIS — E782 Mixed hyperlipidemia: Secondary | ICD-10-CM | POA: Diagnosis not present

## 2023-06-29 LAB — GLUCOSE, RANDOM: Glucose, Bld: 105 mg/dL — ABNORMAL HIGH (ref 70–99)

## 2023-06-29 LAB — HEMOGLOBIN A1C: Hgb A1c MFr Bld: 6.1 % (ref 4.6–6.5)

## 2023-06-29 NOTE — Assessment & Plan Note (Signed)
Continue focus on blood pressure and glucose control, adequate hydration, and avoidance of nephrotoxic medications.

## 2023-06-29 NOTE — Assessment & Plan Note (Signed)
I will check an A1c today. Continue glimepiride 1 mg bid and pioglitazone 30 mg daily.

## 2023-06-29 NOTE — Assessment & Plan Note (Signed)
Lipids are at goal. Continue rosuvastatin 5 mg daily.

## 2023-06-29 NOTE — Progress Notes (Signed)
[facility] [address] [place] [address] Dept: [phone] Dept Fax: [phone]  Chronic Care Office Visit  Subjective:    Patient ID: Destiny Romero, female    DOB: Aug 07, 1951, 72 y.o..   MRN: [id number]  Chief Complaint  Patient presents with   Diabetes    3 month f/u. BS 200>.     History of Present Illness:  Patient is in today for reassessment of chronic medical issues. Destiny Romero shares she has placed her home on the market. She plans to move closer to her sister, near [place].  Destiny Romero has a history of type 2 diabetes. She is currently managed on glimepiride 1 mg bid and pioglitazone 30 mg daily. She notes her sugars range from 80-200.    Destiny Romero has a history of hyperlipidemia. She is managed on rosuvastatin 5 mg daily.    Destiny Romero has a history of CKD, Stage 3a. It has been stable over the past 9 years.  Past Medical History: Patient Active Problem List   Diagnosis Date Noted   Osteoarthritis of right hip 03/29/2023   Viral URI with cough 02/26/2023   Chronic kidney disease, stage 3a (HCC) 01/27/2023   H/O vitrectomy 07/02/2022   Epiretinal membrane, right eye 06/02/2022   Macular pucker, right eye 05/28/2022   HSV-2 (herpes simplex virus 2) infection 04/09/2022   Chronic rhinitis 04/09/2022   Posttraumatic stress disorder 01/07/2022   Arthritis of hand 11/21/2021   History of premalignant rectal tumor 11/13/2021   Degenerative lumbar spinal stenosis 03/03/2021   Lumbar radiculopathy 01/22/2021   Osteoarthritis of both hands 11/10/2020   Carpal tunnel syndrome of left wrist 04/11/2020   Gastroesophageal reflux disease 11/27/2019   Atypical chest pain 11/07/2018   BMI 29.0-29.9,adult 11/07/2018   Dyspnea 11/07/2018   Dupuytren's contracture of both hands 05/30/2018   Constipation 06/08/2016   Decreased visual acuity 06/08/2016   Type 2 diabetes mellitus with retinopathy (HCC)  08/11/2013   Osteopenia 08/19/2012   Hyperlipidemia 06/27/2008   History of colonic polyps 06/27/2008   Recurrent major depressive disorder (HCC) 01/19/2007   Past Surgical History:  Procedure Laterality Date   CARPAL TUNNEL RELEASE Right 05/2020   CATARACT EXTRACTION, BILATERAL Bilateral    cataracts   CERVICAL CONE BIOPSY     COLONOSCOPY  2003   COLONOSCOPY  04/01/2011   [place] - repeat in 5 years   COLONOSCOPY W/ POLYPECTOMY  2007   Dr Kinnie Scales   EXCISION MORTON'S NEUROMA Right    EYE SURGERY     07/02/22, 05/31/22   LAMINECTOMY     1982   LUMBAR LAMINECTOMY Left 11/2022   L4-L5   OOPHORECTOMY Left 12/2011   Dr Nicholas Lose ; ovarian cyst   POLYPECTOMY     RECTAL SURGERY  05/28/2006   pre-canerous lesion removed in 2007; Dr Byrd Hesselbach, [place]   TUBAL LIGATION     Family History  Problem Relation Age of Onset   Diabetes Mother    Dementia Mother    Prostate cancer Father    COPD Father    Heart disease Father        CAD - used nitroglycerin tablets   Diabetes Sister        obese   Thyroid cancer Sister    Cancer Brother        non Hodgkin's Lymphoma, leukemia   Diabetes Brother        not obese   Kidney disease Brother  Kidney failure   Diabetes Brother    Kidney failure Brother    Diabetes Maternal Aunt        X5 ; all IDDM   Heart disease Maternal Aunt        "enlarged heart"   Heart disease Maternal Uncle        "enlarged heart"   Diabetes Maternal Grandmother        IDDM   Diabetes Paternal Grandmother        IDDM   Heart disease Paternal Grandmother    Heart disease Paternal Grandfather        CAD - used nitroglycerin tablets   Colon cancer Neg Hx    Esophageal cancer Neg Hx    Stomach cancer Neg Hx    Stroke Neg Hx    Colon polyps Neg Hx    Rectal cancer Neg Hx    Outpatient Medications Prior to Visit  Medication Sig Dispense Refill   Acetaminophen (TYLENOL PO) Take by mouth daily.     azelastine (ASTELIN) 0.1 % nasal spray Place 1 spray into  both nostrils 2 (two) times daily. Use in each nostril as directed 30 mL 12   Calcium 200 MG TABS Take by mouth. Tale [id number] mg daily     cetirizine (ZYRTEC) 10 MG tablet Take by mouth.     Cholecalciferol (VITAMIN D) 2000 units CAPS daily.     estradiol (ESTRACE) 0.1 MG/GM vaginal cream Place 1 g vaginally 2 (two) times a week. 42.5 g 0   famotidine (PEPCID) 40 MG tablet TAKE 1 TABLET BY MOUTH EVERYDAY AT BEDTIME 90 tablet 3   glimepiride (AMARYL) 1 MG tablet Take 1 tablet (1 mg total) by mouth in the morning and at bedtime. 180 tablet 3   Glucosamine-Chondroitin 1500-1200 MG/30ML LIQD daily.     lamoTRIgine (LAMICTAL) 200 MG tablet Take 200 mg by mouth daily.     levocetirizine (XYZAL ALLERGY 24HR) 5 MG tablet Take 5 mg by mouth every evening.     lidocaine (XYLOCAINE) 5 % ointment Apply 1 application. topically as needed. 30 g 0   Multiple Vitamin (MULTI VITAMIN) TABS daily.     omega-3 acid ethyl esters (LOVAZA) 1 g capsule Take by mouth daily.     OneTouch Delica Lancets 33G MISC Use to test blood sugar 3X daily.  Dx Code: E11.65 300 each 1   ONETOUCH VERIO test strip USE AS INSTRUCTED 300 strip 12   pioglitazone (ACTOS) 30 MG tablet Take 1 tablet (30 mg total) by mouth daily. 90 tablet 2   rosuvastatin (CRESTOR) 5 MG tablet Take 1 tablet (5 mg total) by mouth daily. 90 tablet 3   TURMERIC PO Take by mouth.     No facility-administered medications prior to visit.   Allergies  Allergen Reactions   Short Ragweed Pollen Ext Itching   Grass Pollen(K-O-R-T-Swt Vern) Rash   Other Other (See Comments), Hives, Itching and Nausea Only    Other reaction(s): Headache   Pseudoephedrine Other (See Comments)    tachycardia tachycardia   Atorvastatin Other (See Comments)    Low dose   Codeine    Fluoxetine Hcl Other (See Comments)   Metformin Other (See Comments)    04/15/15 caused dizziness   Metformin And Related     04/15/15 caused dizziness   Pramipexole     nightmares   Shellfish  Allergy Other (See Comments)    [other]   Objective:   Today's Vitals   06/29/23 0854  Temp:  98.2 F (36.8 C)  TempSrc: Temporal  Weight: 168 lb 12.8 oz (76.6 kg)  Height: 5' 3.5" (1.613 m)   Body mass index is 29.43 kg/m.   General: Well developed, well nourished. No acute distress. Psych: Alert and oriented. Normal mood and affect.  Health Maintenance Due  Topic Date Due   DTaP/Tdap/Td (2 - Td or Tdap) 02/04/2023   INFLUENZA VACCINE  04/08/2023   OPHTHALMOLOGY EXAM  05/29/2023     Assessment & Plan:   Problem List Items Addressed This Visit       Endocrine   Type 2 diabetes mellitus with retinopathy (HCC) - Primary    I will check an A1c today. Continue glimepiride 1 mg bid and pioglitazone 30 mg daily.      Relevant Orders   Glucose, random   Hemoglobin A1c     Genitourinary   Chronic kidney disease, stage 3a (HCC)    Continue focus on blood pressure and glucose control, adequate hydration, and avoidance of nephrotoxic medications.         Other   Hyperlipidemia    Lipids are at goal. Continue rosuvastatin 5 mg daily.       Return in about 3 months (around 09/29/2023) for Reassessment.   Loyola Mast, MD

## 2023-06-30 DIAGNOSIS — F4310 Post-traumatic stress disorder, unspecified: Secondary | ICD-10-CM | POA: Diagnosis not present

## 2023-07-07 DIAGNOSIS — F419 Anxiety disorder, unspecified: Secondary | ICD-10-CM | POA: Diagnosis not present

## 2023-07-07 DIAGNOSIS — F339 Major depressive disorder, recurrent, unspecified: Secondary | ICD-10-CM | POA: Diagnosis not present

## 2023-07-07 DIAGNOSIS — F4310 Post-traumatic stress disorder, unspecified: Secondary | ICD-10-CM | POA: Diagnosis not present

## 2023-09-04 ENCOUNTER — Other Ambulatory Visit: Payer: Self-pay | Admitting: Family Medicine

## 2023-09-04 DIAGNOSIS — E785 Hyperlipidemia, unspecified: Secondary | ICD-10-CM

## 2023-09-06 DIAGNOSIS — M545 Low back pain, unspecified: Secondary | ICD-10-CM | POA: Diagnosis not present

## 2023-09-06 DIAGNOSIS — S92902A Unspecified fracture of left foot, initial encounter for closed fracture: Secondary | ICD-10-CM | POA: Diagnosis not present

## 2023-09-06 DIAGNOSIS — M25572 Pain in left ankle and joints of left foot: Secondary | ICD-10-CM | POA: Diagnosis not present

## 2023-09-06 DIAGNOSIS — S93402A Sprain of unspecified ligament of left ankle, initial encounter: Secondary | ICD-10-CM | POA: Diagnosis not present

## 2023-09-06 DIAGNOSIS — S79911A Unspecified injury of right hip, initial encounter: Secondary | ICD-10-CM | POA: Diagnosis not present

## 2023-09-06 DIAGNOSIS — M79672 Pain in left foot: Secondary | ICD-10-CM | POA: Diagnosis not present

## 2023-09-06 DIAGNOSIS — R519 Headache, unspecified: Secondary | ICD-10-CM | POA: Diagnosis not present

## 2023-09-06 DIAGNOSIS — M25551 Pain in right hip: Secondary | ICD-10-CM | POA: Diagnosis not present

## 2023-09-29 ENCOUNTER — Ambulatory Visit: Payer: Medicare HMO | Admitting: Family Medicine

## 2023-10-04 DIAGNOSIS — E119 Type 2 diabetes mellitus without complications: Secondary | ICD-10-CM | POA: Diagnosis not present

## 2023-10-04 DIAGNOSIS — H35371 Puckering of macula, right eye: Secondary | ICD-10-CM | POA: Diagnosis not present

## 2023-10-07 DIAGNOSIS — Z1231 Encounter for screening mammogram for malignant neoplasm of breast: Secondary | ICD-10-CM | POA: Diagnosis not present

## 2023-10-12 ENCOUNTER — Ambulatory Visit: Payer: Medicare HMO | Admitting: Family Medicine

## 2023-11-02 ENCOUNTER — Other Ambulatory Visit: Payer: Self-pay | Admitting: Family Medicine

## 2023-11-02 DIAGNOSIS — E11319 Type 2 diabetes mellitus with unspecified diabetic retinopathy without macular edema: Secondary | ICD-10-CM

## 2024-01-09 ENCOUNTER — Other Ambulatory Visit: Payer: Self-pay | Admitting: Family Medicine

## 2024-01-09 DIAGNOSIS — E11319 Type 2 diabetes mellitus with unspecified diabetic retinopathy without macular edema: Secondary | ICD-10-CM

## 2024-02-24 ENCOUNTER — Other Ambulatory Visit: Payer: Self-pay | Admitting: Family Medicine

## 2024-02-24 DIAGNOSIS — K219 Gastro-esophageal reflux disease without esophagitis: Secondary | ICD-10-CM

## 2024-05-25 ENCOUNTER — Other Ambulatory Visit: Payer: Self-pay | Admitting: Family Medicine

## 2024-05-25 DIAGNOSIS — K219 Gastro-esophageal reflux disease without esophagitis: Secondary | ICD-10-CM
